# Patient Record
Sex: Female | Born: 1940
Health system: Southern US, Community
[De-identification: ages and names within clinical notes are randomized; demographics above are authoritative.]

## PROBLEM LIST (undated history)

## (undated) ENCOUNTER — Emergency Department (HOSPITAL_COMMUNITY): Discharge status: Absent without leave | Payer: Commercial Managed Care - HMO

## (undated) DIAGNOSIS — F519 Sleep disorder not due to a substance or known physiological condition, unspecified: Secondary | ICD-10-CM

## (undated) DIAGNOSIS — I10 Essential (primary) hypertension: Secondary | ICD-10-CM

## (undated) DIAGNOSIS — F329 Major depressive disorder, single episode, unspecified: Secondary | ICD-10-CM

## (undated) DIAGNOSIS — I2699 Other pulmonary embolism without acute cor pulmonale: Secondary | ICD-10-CM

## (undated) DIAGNOSIS — J189 Pneumonia, unspecified organism: Secondary | ICD-10-CM

## (undated) DIAGNOSIS — R51 Headache: Secondary | ICD-10-CM

## (undated) DIAGNOSIS — E785 Hyperlipidemia, unspecified: Secondary | ICD-10-CM

## (undated) DIAGNOSIS — M5416 Radiculopathy, lumbar region: Secondary | ICD-10-CM

## (undated) DIAGNOSIS — M797 Fibromyalgia: Secondary | ICD-10-CM

## (undated) DIAGNOSIS — R413 Other amnesia: Secondary | ICD-10-CM

## (undated) DIAGNOSIS — R569 Unspecified convulsions: Secondary | ICD-10-CM

## (undated) DIAGNOSIS — M1710 Unilateral primary osteoarthritis, unspecified knee: Secondary | ICD-10-CM

## (undated) DIAGNOSIS — F419 Anxiety disorder, unspecified: Secondary | ICD-10-CM

## (undated) DIAGNOSIS — M949 Disorder of cartilage, unspecified: Secondary | ICD-10-CM

## (undated) DIAGNOSIS — K219 Gastro-esophageal reflux disease without esophagitis: Secondary | ICD-10-CM

## (undated) DIAGNOSIS — M353 Polymyalgia rheumatica: Secondary | ICD-10-CM

## (undated) DIAGNOSIS — R600 Localized edema: Secondary | ICD-10-CM

## (undated) DIAGNOSIS — E0500 Thyrotoxicosis with diffuse goiter without thyrotoxic crisis or storm: Secondary | ICD-10-CM

## (undated) DIAGNOSIS — J309 Allergic rhinitis, unspecified: Secondary | ICD-10-CM

## (undated) DIAGNOSIS — IMO0002 Reserved for concepts with insufficient information to code with codable children: Secondary | ICD-10-CM

## (undated) DIAGNOSIS — R251 Tremor, unspecified: Secondary | ICD-10-CM

## (undated) DIAGNOSIS — J45909 Unspecified asthma, uncomplicated: Secondary | ICD-10-CM

## (undated) DIAGNOSIS — I639 Cerebral infarction, unspecified: Secondary | ICD-10-CM

## (undated) DIAGNOSIS — G43909 Migraine, unspecified, not intractable, without status migrainosus: Secondary | ICD-10-CM

## (undated) DIAGNOSIS — R609 Edema, unspecified: Secondary | ICD-10-CM

## (undated) DIAGNOSIS — F3289 Other specified depressive episodes: Secondary | ICD-10-CM

## (undated) DIAGNOSIS — M899 Disorder of bone, unspecified: Secondary | ICD-10-CM

## (undated) HISTORY — DX: Fibromyalgia: M79.7

## (undated) HISTORY — DX: Radiculopathy, lumbar region: M54.16

## (undated) HISTORY — DX: Reserved for concepts with insufficient information to code with codable children: IMO0002

## (undated) HISTORY — DX: Other pulmonary embolism without acute cor pulmonale: I26.99

## (undated) HISTORY — DX: Polymyalgia rheumatica: M35.3

## (undated) HISTORY — DX: Allergic rhinitis, unspecified: J30.9

## (undated) HISTORY — DX: Headache: R51

## (undated) HISTORY — DX: Thyrotoxicosis with diffuse goiter without thyrotoxic crisis or storm: E05.00

## (undated) HISTORY — DX: Anxiety disorder, unspecified: F41.9

## (undated) HISTORY — DX: Hyperlipidemia, unspecified: E78.5

## (undated) HISTORY — DX: Cerebral infarction, unspecified: I63.9

## (undated) HISTORY — DX: Other specified depressive episodes: F32.89

## (undated) HISTORY — DX: Unspecified convulsions: R56.9

## (undated) HISTORY — DX: Unilateral primary osteoarthritis, unspecified knee: M17.10

## (undated) HISTORY — DX: Essential (primary) hypertension: I10

## (undated) HISTORY — PX: COLONOSCOPY: SHX174

## (undated) HISTORY — DX: Gastro-esophageal reflux disease without esophagitis: K21.9

## (undated) HISTORY — DX: Sleep disorder not due to a substance or known physiological condition, unspecified: F51.9

## (undated) HISTORY — DX: Disorder of bone, unspecified: M89.9

## (undated) HISTORY — DX: Tremor, unspecified: R25.1

## (undated) HISTORY — DX: Major depressive disorder, single episode, unspecified: F32.9

## (undated) HISTORY — DX: Migraine, unspecified, not intractable, without status migrainosus: G43.909

## (undated) HISTORY — DX: Disorder of cartilage, unspecified: M94.9

---

## 1997-05-21 ENCOUNTER — Other Ambulatory Visit: Admission: RE | Admit: 1997-05-21 | Discharge: 1997-05-21 | Payer: Self-pay | Admitting: Obstetrics and Gynecology

## 1997-10-01 ENCOUNTER — Other Ambulatory Visit: Admission: RE | Admit: 1997-10-01 | Discharge: 1997-10-01 | Payer: Self-pay | Admitting: Obstetrics and Gynecology

## 1998-05-24 ENCOUNTER — Encounter: Payer: Self-pay | Admitting: Emergency Medicine

## 1998-05-24 ENCOUNTER — Emergency Department (HOSPITAL_COMMUNITY): Admission: EM | Admit: 1998-05-24 | Discharge: 1998-05-24 | Payer: Self-pay | Admitting: Emergency Medicine

## 1998-11-15 ENCOUNTER — Ambulatory Visit (HOSPITAL_COMMUNITY): Admission: RE | Admit: 1998-11-15 | Discharge: 1998-11-15 | Payer: Self-pay | Admitting: Internal Medicine

## 1998-11-15 ENCOUNTER — Encounter: Payer: Self-pay | Admitting: Internal Medicine

## 1999-10-14 ENCOUNTER — Emergency Department (HOSPITAL_COMMUNITY): Admission: EM | Admit: 1999-10-14 | Discharge: 1999-10-14 | Payer: Self-pay | Admitting: Emergency Medicine

## 1999-12-15 ENCOUNTER — Emergency Department (HOSPITAL_COMMUNITY): Admission: EM | Admit: 1999-12-15 | Discharge: 1999-12-15 | Payer: Self-pay | Admitting: Emergency Medicine

## 2000-01-26 ENCOUNTER — Ambulatory Visit (HOSPITAL_COMMUNITY): Admission: RE | Admit: 2000-01-26 | Discharge: 2000-01-26 | Payer: Self-pay | Admitting: Family Medicine

## 2000-01-26 ENCOUNTER — Encounter: Payer: Self-pay | Admitting: Family Medicine

## 2000-04-25 ENCOUNTER — Ambulatory Visit (HOSPITAL_COMMUNITY): Admission: RE | Admit: 2000-04-25 | Discharge: 2000-04-25 | Payer: Self-pay | Admitting: Family Medicine

## 2000-04-25 ENCOUNTER — Encounter: Payer: Self-pay | Admitting: Family Medicine

## 2000-08-12 ENCOUNTER — Emergency Department (HOSPITAL_COMMUNITY): Admission: EM | Admit: 2000-08-12 | Discharge: 2000-08-12 | Payer: Self-pay | Admitting: Emergency Medicine

## 2000-08-30 ENCOUNTER — Encounter: Payer: Self-pay | Admitting: Emergency Medicine

## 2000-08-30 ENCOUNTER — Inpatient Hospital Stay (HOSPITAL_COMMUNITY): Admission: EM | Admit: 2000-08-30 | Discharge: 2000-08-31 | Payer: Self-pay | Admitting: Emergency Medicine

## 2000-12-30 ENCOUNTER — Encounter: Payer: Self-pay | Admitting: Family Medicine

## 2000-12-30 ENCOUNTER — Ambulatory Visit (HOSPITAL_COMMUNITY): Admission: RE | Admit: 2000-12-30 | Discharge: 2000-12-30 | Payer: Self-pay | Admitting: Family Medicine

## 2001-02-24 ENCOUNTER — Emergency Department (HOSPITAL_COMMUNITY): Admission: EM | Admit: 2001-02-24 | Discharge: 2001-02-24 | Payer: Self-pay | Admitting: Emergency Medicine

## 2001-02-24 ENCOUNTER — Encounter: Payer: Self-pay | Admitting: Emergency Medicine

## 2001-07-21 ENCOUNTER — Emergency Department (HOSPITAL_COMMUNITY): Admission: EM | Admit: 2001-07-21 | Discharge: 2001-07-21 | Payer: Self-pay | Admitting: Emergency Medicine

## 2001-07-24 ENCOUNTER — Encounter: Payer: Self-pay | Admitting: *Deleted

## 2001-07-24 ENCOUNTER — Emergency Department (HOSPITAL_COMMUNITY): Admission: EM | Admit: 2001-07-24 | Discharge: 2001-07-24 | Payer: Self-pay | Admitting: *Deleted

## 2001-11-03 ENCOUNTER — Ambulatory Visit (HOSPITAL_COMMUNITY): Admission: RE | Admit: 2001-11-03 | Discharge: 2001-11-03 | Payer: Self-pay | Admitting: Family Medicine

## 2001-12-10 ENCOUNTER — Emergency Department (HOSPITAL_COMMUNITY): Admission: EM | Admit: 2001-12-10 | Discharge: 2001-12-10 | Payer: Self-pay | Admitting: Emergency Medicine

## 2001-12-19 ENCOUNTER — Emergency Department (HOSPITAL_COMMUNITY): Admission: EM | Admit: 2001-12-19 | Discharge: 2001-12-19 | Payer: Self-pay | Admitting: Emergency Medicine

## 2001-12-19 ENCOUNTER — Encounter: Payer: Self-pay | Admitting: Emergency Medicine

## 2002-01-11 ENCOUNTER — Encounter: Payer: Self-pay | Admitting: Orthopedic Surgery

## 2002-01-11 ENCOUNTER — Ambulatory Visit (HOSPITAL_COMMUNITY): Admission: RE | Admit: 2002-01-11 | Discharge: 2002-01-11 | Payer: Self-pay | Admitting: Orthopedic Surgery

## 2002-02-10 ENCOUNTER — Encounter: Admission: RE | Admit: 2002-02-10 | Discharge: 2002-02-10 | Payer: Self-pay | Admitting: Internal Medicine

## 2002-03-30 ENCOUNTER — Emergency Department (HOSPITAL_COMMUNITY): Admission: EM | Admit: 2002-03-30 | Discharge: 2002-03-30 | Payer: Self-pay | Admitting: Emergency Medicine

## 2002-05-20 ENCOUNTER — Emergency Department (HOSPITAL_COMMUNITY): Admission: EM | Admit: 2002-05-20 | Discharge: 2002-05-20 | Payer: Self-pay | Admitting: Emergency Medicine

## 2002-05-20 ENCOUNTER — Encounter: Payer: Self-pay | Admitting: *Deleted

## 2002-07-07 ENCOUNTER — Ambulatory Visit (HOSPITAL_COMMUNITY): Admission: RE | Admit: 2002-07-07 | Discharge: 2002-07-07 | Payer: Self-pay | Admitting: Family Medicine

## 2002-07-07 ENCOUNTER — Encounter: Payer: Self-pay | Admitting: Family Medicine

## 2002-09-24 ENCOUNTER — Encounter: Payer: Self-pay | Admitting: Emergency Medicine

## 2002-09-24 ENCOUNTER — Emergency Department (HOSPITAL_COMMUNITY): Admission: EM | Admit: 2002-09-24 | Discharge: 2002-09-24 | Payer: Self-pay | Admitting: Emergency Medicine

## 2003-01-18 ENCOUNTER — Encounter: Payer: Self-pay | Admitting: Emergency Medicine

## 2003-01-18 ENCOUNTER — Emergency Department (HOSPITAL_COMMUNITY): Admission: EM | Admit: 2003-01-18 | Discharge: 2003-01-19 | Payer: Self-pay | Admitting: Emergency Medicine

## 2003-04-28 ENCOUNTER — Encounter: Admission: RE | Admit: 2003-04-28 | Discharge: 2003-04-28 | Payer: Self-pay | Admitting: Family Medicine

## 2003-05-05 ENCOUNTER — Encounter: Admission: RE | Admit: 2003-05-05 | Discharge: 2003-05-05 | Payer: Self-pay | Admitting: Family Medicine

## 2003-05-06 ENCOUNTER — Encounter: Admission: RE | Admit: 2003-05-06 | Discharge: 2003-05-06 | Payer: Self-pay | Admitting: Sports Medicine

## 2003-05-18 ENCOUNTER — Encounter: Admission: RE | Admit: 2003-05-18 | Discharge: 2003-05-18 | Payer: Self-pay | Admitting: Sports Medicine

## 2003-06-17 ENCOUNTER — Encounter: Admission: RE | Admit: 2003-06-17 | Discharge: 2003-06-17 | Payer: Self-pay | Admitting: Family Medicine

## 2003-06-23 ENCOUNTER — Encounter: Admission: RE | Admit: 2003-06-23 | Discharge: 2003-06-23 | Payer: Self-pay | Admitting: Family Medicine

## 2003-07-07 ENCOUNTER — Encounter: Admission: RE | Admit: 2003-07-07 | Discharge: 2003-07-07 | Payer: Self-pay | Admitting: Family Medicine

## 2003-08-06 ENCOUNTER — Encounter: Admission: RE | Admit: 2003-08-06 | Discharge: 2003-08-06 | Payer: Self-pay | Admitting: Family Medicine

## 2003-08-06 ENCOUNTER — Ambulatory Visit (HOSPITAL_COMMUNITY): Admission: RE | Admit: 2003-08-06 | Discharge: 2003-08-06 | Payer: Self-pay | Admitting: Family Medicine

## 2003-08-06 ENCOUNTER — Encounter: Admission: RE | Admit: 2003-08-06 | Discharge: 2003-08-06 | Payer: Self-pay | Admitting: Sports Medicine

## 2003-08-20 ENCOUNTER — Encounter: Admission: RE | Admit: 2003-08-20 | Discharge: 2003-08-20 | Payer: Self-pay | Admitting: Sports Medicine

## 2003-09-28 ENCOUNTER — Encounter: Admission: RE | Admit: 2003-09-28 | Discharge: 2003-09-28 | Payer: Self-pay | Admitting: Family Medicine

## 2003-10-22 ENCOUNTER — Encounter: Admission: RE | Admit: 2003-10-22 | Discharge: 2003-10-22 | Payer: Self-pay | Admitting: Family Medicine

## 2003-10-25 ENCOUNTER — Encounter: Admission: RE | Admit: 2003-10-25 | Discharge: 2003-10-25 | Payer: Self-pay | Admitting: Family Medicine

## 2003-10-26 ENCOUNTER — Encounter: Admission: RE | Admit: 2003-10-26 | Discharge: 2003-10-26 | Payer: Self-pay | Admitting: Sports Medicine

## 2003-10-29 ENCOUNTER — Encounter: Admission: RE | Admit: 2003-10-29 | Discharge: 2003-10-29 | Payer: Self-pay | Admitting: Sports Medicine

## 2003-11-09 ENCOUNTER — Encounter: Admission: RE | Admit: 2003-11-09 | Discharge: 2003-11-09 | Payer: Self-pay | Admitting: Family Medicine

## 2003-11-11 ENCOUNTER — Ambulatory Visit (HOSPITAL_COMMUNITY): Admission: RE | Admit: 2003-11-11 | Discharge: 2003-11-11 | Payer: Self-pay | Admitting: Family Medicine

## 2003-12-09 ENCOUNTER — Ambulatory Visit: Payer: Self-pay | Admitting: Family Medicine

## 2004-01-17 ENCOUNTER — Ambulatory Visit: Payer: Self-pay | Admitting: Family Medicine

## 2004-02-01 ENCOUNTER — Ambulatory Visit (HOSPITAL_COMMUNITY): Admission: RE | Admit: 2004-02-01 | Discharge: 2004-02-01 | Payer: Self-pay | Admitting: Sports Medicine

## 2004-02-01 ENCOUNTER — Ambulatory Visit: Payer: Self-pay | Admitting: Sports Medicine

## 2004-02-08 ENCOUNTER — Ambulatory Visit: Payer: Self-pay | Admitting: Sports Medicine

## 2004-02-22 ENCOUNTER — Ambulatory Visit: Payer: Self-pay | Admitting: Family Medicine

## 2004-03-08 ENCOUNTER — Encounter: Admission: RE | Admit: 2004-03-08 | Discharge: 2004-05-16 | Payer: Self-pay | Admitting: Sports Medicine

## 2004-04-02 HISTORY — PX: OTHER SURGICAL HISTORY: SHX169

## 2004-04-04 ENCOUNTER — Ambulatory Visit: Payer: Self-pay | Admitting: Sports Medicine

## 2004-04-29 ENCOUNTER — Emergency Department (HOSPITAL_COMMUNITY): Admission: EM | Admit: 2004-04-29 | Discharge: 2004-04-29 | Payer: Self-pay

## 2004-05-10 ENCOUNTER — Emergency Department (HOSPITAL_COMMUNITY): Admission: EM | Admit: 2004-05-10 | Discharge: 2004-05-11 | Payer: Self-pay | Admitting: *Deleted

## 2004-05-25 ENCOUNTER — Ambulatory Visit: Payer: Self-pay | Admitting: Family Medicine

## 2004-06-12 ENCOUNTER — Encounter: Admission: RE | Admit: 2004-06-12 | Discharge: 2004-06-12 | Payer: Self-pay | Admitting: Sports Medicine

## 2004-06-25 ENCOUNTER — Emergency Department (HOSPITAL_COMMUNITY): Admission: EM | Admit: 2004-06-25 | Discharge: 2004-06-25 | Payer: Self-pay | Admitting: Family Medicine

## 2004-06-28 ENCOUNTER — Ambulatory Visit: Payer: Self-pay | Admitting: Family Medicine

## 2004-07-19 ENCOUNTER — Ambulatory Visit: Payer: Self-pay | Admitting: Family Medicine

## 2004-09-07 ENCOUNTER — Ambulatory Visit: Payer: Self-pay | Admitting: Sports Medicine

## 2004-10-05 ENCOUNTER — Emergency Department (HOSPITAL_COMMUNITY): Admission: EM | Admit: 2004-10-05 | Discharge: 2004-10-06 | Payer: Self-pay | Admitting: Emergency Medicine

## 2004-10-14 ENCOUNTER — Emergency Department (HOSPITAL_COMMUNITY): Admission: EM | Admit: 2004-10-14 | Discharge: 2004-10-14 | Payer: Self-pay | Admitting: Family Medicine

## 2004-10-20 ENCOUNTER — Ambulatory Visit: Payer: Self-pay | Admitting: Family Medicine

## 2004-11-25 ENCOUNTER — Emergency Department (HOSPITAL_COMMUNITY): Admission: EM | Admit: 2004-11-25 | Discharge: 2004-11-25 | Payer: Self-pay | Admitting: Family Medicine

## 2004-12-08 ENCOUNTER — Ambulatory Visit: Payer: Self-pay | Admitting: Family Medicine

## 2004-12-27 ENCOUNTER — Ambulatory Visit: Payer: Self-pay | Admitting: Family Medicine

## 2005-01-10 ENCOUNTER — Encounter: Admission: RE | Admit: 2005-01-10 | Discharge: 2005-03-01 | Payer: Self-pay | Admitting: Family Medicine

## 2005-01-12 ENCOUNTER — Ambulatory Visit: Payer: Self-pay

## 2005-02-28 ENCOUNTER — Ambulatory Visit: Payer: Self-pay | Admitting: Family Medicine

## 2005-04-12 ENCOUNTER — Emergency Department (HOSPITAL_COMMUNITY): Admission: EM | Admit: 2005-04-12 | Discharge: 2005-04-12 | Payer: Self-pay | Admitting: Family Medicine

## 2005-05-01 ENCOUNTER — Ambulatory Visit: Payer: Self-pay | Admitting: Family Medicine

## 2005-05-09 ENCOUNTER — Encounter: Admission: RE | Admit: 2005-05-09 | Discharge: 2005-06-06 | Payer: Self-pay | Admitting: Family Medicine

## 2005-05-30 ENCOUNTER — Ambulatory Visit: Payer: Self-pay | Admitting: Family Medicine

## 2005-06-12 ENCOUNTER — Emergency Department (HOSPITAL_COMMUNITY): Admission: EM | Admit: 2005-06-12 | Discharge: 2005-06-12 | Payer: Self-pay | Admitting: *Deleted

## 2005-06-14 ENCOUNTER — Ambulatory Visit: Payer: Self-pay | Admitting: Family Medicine

## 2005-07-01 LAB — HM COLONOSCOPY

## 2005-07-31 ENCOUNTER — Ambulatory Visit: Payer: Self-pay | Admitting: Family Medicine

## 2005-08-03 ENCOUNTER — Encounter: Admission: RE | Admit: 2005-08-03 | Discharge: 2005-08-03 | Payer: Self-pay | Admitting: Sports Medicine

## 2005-08-20 ENCOUNTER — Emergency Department (HOSPITAL_COMMUNITY): Admission: EM | Admit: 2005-08-20 | Discharge: 2005-08-20 | Payer: Self-pay | Admitting: Emergency Medicine

## 2005-09-18 ENCOUNTER — Ambulatory Visit (HOSPITAL_COMMUNITY): Admission: RE | Admit: 2005-09-18 | Discharge: 2005-09-18 | Payer: Self-pay | Admitting: Gastroenterology

## 2005-10-05 ENCOUNTER — Ambulatory Visit: Payer: Self-pay | Admitting: Family Medicine

## 2005-10-05 ENCOUNTER — Ambulatory Visit (HOSPITAL_COMMUNITY): Admission: RE | Admit: 2005-10-05 | Discharge: 2005-10-05 | Payer: Self-pay | Admitting: Family Medicine

## 2005-10-22 HISTORY — PX: NM MYOCAR PERF WALL MOTION: HXRAD629

## 2005-11-09 ENCOUNTER — Ambulatory Visit: Payer: Self-pay | Admitting: Family Medicine

## 2005-11-29 ENCOUNTER — Ambulatory Visit: Payer: Self-pay | Admitting: Family Medicine

## 2005-11-30 ENCOUNTER — Encounter: Admission: RE | Admit: 2005-11-30 | Discharge: 2005-11-30 | Payer: Self-pay | Admitting: Orthopedic Surgery

## 2005-12-25 ENCOUNTER — Ambulatory Visit: Payer: Self-pay | Admitting: Family Medicine

## 2005-12-26 ENCOUNTER — Ambulatory Visit (HOSPITAL_COMMUNITY): Admission: RE | Admit: 2005-12-26 | Discharge: 2005-12-26 | Payer: Self-pay | Admitting: Orthopedic Surgery

## 2006-01-11 ENCOUNTER — Ambulatory Visit: Payer: Self-pay | Admitting: Family Medicine

## 2006-01-16 ENCOUNTER — Encounter: Admission: RE | Admit: 2006-01-16 | Discharge: 2006-02-06 | Payer: Self-pay | Admitting: Orthopedic Surgery

## 2006-04-02 ENCOUNTER — Encounter (INDEPENDENT_AMBULATORY_CARE_PROVIDER_SITE_OTHER): Payer: Self-pay | Admitting: *Deleted

## 2006-04-02 LAB — CONVERTED CEMR LAB

## 2006-04-12 ENCOUNTER — Ambulatory Visit: Payer: Self-pay | Admitting: Family Medicine

## 2006-04-12 ENCOUNTER — Encounter (INDEPENDENT_AMBULATORY_CARE_PROVIDER_SITE_OTHER): Payer: Self-pay | Admitting: Family Medicine

## 2006-04-12 ENCOUNTER — Other Ambulatory Visit: Admission: RE | Admit: 2006-04-12 | Discharge: 2006-04-12 | Payer: Self-pay | Admitting: Family Medicine

## 2006-04-23 ENCOUNTER — Ambulatory Visit: Payer: Self-pay | Admitting: Family Medicine

## 2006-05-21 ENCOUNTER — Ambulatory Visit: Payer: Self-pay | Admitting: Family Medicine

## 2006-05-21 ENCOUNTER — Ambulatory Visit (HOSPITAL_COMMUNITY): Admission: RE | Admit: 2006-05-21 | Discharge: 2006-05-21 | Payer: Self-pay | Admitting: Family Medicine

## 2006-05-30 DIAGNOSIS — F339 Major depressive disorder, recurrent, unspecified: Secondary | ICD-10-CM | POA: Insufficient documentation

## 2006-05-30 DIAGNOSIS — E785 Hyperlipidemia, unspecified: Secondary | ICD-10-CM

## 2006-05-30 DIAGNOSIS — M1710 Unilateral primary osteoarthritis, unspecified knee: Secondary | ICD-10-CM

## 2006-05-30 DIAGNOSIS — K219 Gastro-esophageal reflux disease without esophagitis: Secondary | ICD-10-CM

## 2006-05-30 DIAGNOSIS — J309 Allergic rhinitis, unspecified: Secondary | ICD-10-CM | POA: Insufficient documentation

## 2006-05-31 ENCOUNTER — Encounter (INDEPENDENT_AMBULATORY_CARE_PROVIDER_SITE_OTHER): Payer: Self-pay | Admitting: *Deleted

## 2006-06-06 ENCOUNTER — Telehealth: Payer: Self-pay | Admitting: *Deleted

## 2006-06-21 ENCOUNTER — Ambulatory Visit: Payer: Self-pay | Admitting: Family Medicine

## 2006-06-21 DIAGNOSIS — R5383 Other fatigue: Secondary | ICD-10-CM

## 2006-06-21 DIAGNOSIS — R5381 Other malaise: Secondary | ICD-10-CM | POA: Insufficient documentation

## 2006-07-12 ENCOUNTER — Telehealth: Payer: Self-pay | Admitting: *Deleted

## 2006-07-18 ENCOUNTER — Ambulatory Visit: Payer: Self-pay | Admitting: Family Medicine

## 2006-08-07 ENCOUNTER — Telehealth (INDEPENDENT_AMBULATORY_CARE_PROVIDER_SITE_OTHER): Payer: Self-pay | Admitting: Family Medicine

## 2006-08-22 ENCOUNTER — Encounter (INDEPENDENT_AMBULATORY_CARE_PROVIDER_SITE_OTHER): Payer: Self-pay | Admitting: Family Medicine

## 2006-08-22 ENCOUNTER — Encounter: Admission: RE | Admit: 2006-08-22 | Discharge: 2006-08-22 | Payer: Self-pay | Admitting: Sports Medicine

## 2006-09-09 ENCOUNTER — Ambulatory Visit: Payer: Self-pay | Admitting: Sports Medicine

## 2006-09-13 ENCOUNTER — Emergency Department (HOSPITAL_COMMUNITY): Admission: EM | Admit: 2006-09-13 | Discharge: 2006-09-13 | Payer: Self-pay | Admitting: Family Medicine

## 2006-10-01 ENCOUNTER — Telehealth: Payer: Self-pay | Admitting: *Deleted

## 2006-10-02 ENCOUNTER — Ambulatory Visit: Payer: Self-pay | Admitting: Family Medicine

## 2006-10-02 ENCOUNTER — Encounter (INDEPENDENT_AMBULATORY_CARE_PROVIDER_SITE_OTHER): Payer: Self-pay | Admitting: Family Medicine

## 2006-10-02 LAB — CONVERTED CEMR LAB
Albumin: 3.9 g/dL (ref 3.5–5.2)
BUN: 21 mg/dL (ref 6–23)
CO2: 25 meq/L (ref 19–32)
Calcium: 9.6 mg/dL (ref 8.4–10.5)
Glucose, Bld: 89 mg/dL (ref 70–99)
Hemoglobin: 14.4 g/dL (ref 12.0–15.0)
MCHC: 33 g/dL (ref 30.0–36.0)
MCV: 89.2 fL (ref 78.0–100.0)
Potassium: 3.9 meq/L (ref 3.5–5.3)
Pro B Natriuretic peptide (BNP): 41 pg/mL (ref 0.0–100.0)
RBC: 4.89 M/uL (ref 3.87–5.11)
Sodium: 136 meq/L (ref 135–145)
TSH: 1.25 microintl units/mL (ref 0.350–5.50)
Total Protein: 7.2 g/dL (ref 6.0–8.3)
WBC: 4.7 10*3/uL (ref 4.0–10.5)

## 2006-10-03 ENCOUNTER — Encounter (INDEPENDENT_AMBULATORY_CARE_PROVIDER_SITE_OTHER): Payer: Self-pay | Admitting: Family Medicine

## 2006-11-13 ENCOUNTER — Encounter (INDEPENDENT_AMBULATORY_CARE_PROVIDER_SITE_OTHER): Payer: Self-pay | Admitting: *Deleted

## 2006-11-22 ENCOUNTER — Ambulatory Visit: Payer: Self-pay | Admitting: Family Medicine

## 2006-11-22 ENCOUNTER — Encounter (INDEPENDENT_AMBULATORY_CARE_PROVIDER_SITE_OTHER): Payer: Self-pay | Admitting: *Deleted

## 2006-11-25 LAB — CONVERTED CEMR LAB: Phenobarbital: 7.5 ug/mL — ABNORMAL LOW (ref 15.0–40.0)

## 2006-12-19 ENCOUNTER — Telehealth: Payer: Self-pay | Admitting: *Deleted

## 2007-01-21 ENCOUNTER — Ambulatory Visit: Payer: Self-pay | Admitting: Family Medicine

## 2007-01-21 ENCOUNTER — Encounter (INDEPENDENT_AMBULATORY_CARE_PROVIDER_SITE_OTHER): Payer: Self-pay | Admitting: *Deleted

## 2007-01-21 DIAGNOSIS — G479 Sleep disorder, unspecified: Secondary | ICD-10-CM

## 2007-01-21 LAB — CONVERTED CEMR LAB
ALT: 16 units/L (ref 0–35)
Albumin: 3.9 g/dL (ref 3.5–5.2)
Alkaline Phosphatase: 108 units/L (ref 39–117)
Glucose, Bld: 85 mg/dL (ref 70–99)
MCHC: 31.3 g/dL (ref 30.0–36.0)
Platelets: 240 10*3/uL (ref 150–400)
Potassium: 4.5 meq/L (ref 3.5–5.3)
RBC: 4.96 M/uL (ref 3.87–5.11)
Sodium: 143 meq/L (ref 135–145)
TSH: 2.842 microintl units/mL (ref 0.350–5.50)
Total Protein: 7.3 g/dL (ref 6.0–8.3)
WBC: 5.2 10*3/uL (ref 4.0–10.5)

## 2007-02-06 ENCOUNTER — Telehealth: Payer: Self-pay | Admitting: *Deleted

## 2007-02-16 ENCOUNTER — Emergency Department (HOSPITAL_COMMUNITY): Admission: EM | Admit: 2007-02-16 | Discharge: 2007-02-16 | Payer: Self-pay | Admitting: Emergency Medicine

## 2007-04-23 ENCOUNTER — Ambulatory Visit: Payer: Self-pay | Admitting: Family Medicine

## 2007-05-02 ENCOUNTER — Encounter: Payer: Self-pay | Admitting: Family Medicine

## 2007-05-03 ENCOUNTER — Telehealth (INDEPENDENT_AMBULATORY_CARE_PROVIDER_SITE_OTHER): Payer: Self-pay | Admitting: *Deleted

## 2007-05-07 ENCOUNTER — Ambulatory Visit: Payer: Self-pay | Admitting: Family Medicine

## 2007-05-16 ENCOUNTER — Ambulatory Visit: Payer: Self-pay | Admitting: Sports Medicine

## 2007-05-16 ENCOUNTER — Encounter: Admission: RE | Admit: 2007-05-16 | Discharge: 2007-05-16 | Payer: Self-pay | Admitting: Family Medicine

## 2007-05-16 DIAGNOSIS — M25569 Pain in unspecified knee: Secondary | ICD-10-CM

## 2007-05-19 ENCOUNTER — Telehealth: Payer: Self-pay | Admitting: *Deleted

## 2007-05-20 ENCOUNTER — Telehealth: Payer: Self-pay | Admitting: *Deleted

## 2007-05-21 ENCOUNTER — Ambulatory Visit: Payer: Self-pay | Admitting: Family Medicine

## 2007-06-18 ENCOUNTER — Ambulatory Visit: Payer: Self-pay | Admitting: Family Medicine

## 2007-06-18 ENCOUNTER — Encounter (INDEPENDENT_AMBULATORY_CARE_PROVIDER_SITE_OTHER): Payer: Self-pay | Admitting: Family Medicine

## 2007-07-01 ENCOUNTER — Encounter: Payer: Self-pay | Admitting: Family Medicine

## 2007-07-22 ENCOUNTER — Encounter (INDEPENDENT_AMBULATORY_CARE_PROVIDER_SITE_OTHER): Payer: Self-pay | Admitting: Family Medicine

## 2007-07-22 ENCOUNTER — Ambulatory Visit: Payer: Self-pay | Admitting: Family Medicine

## 2007-07-22 DIAGNOSIS — R609 Edema, unspecified: Secondary | ICD-10-CM

## 2007-07-22 LAB — CONVERTED CEMR LAB
BUN: 17 mg/dL (ref 6–23)
CO2: 25 meq/L (ref 19–32)
Calcium: 9.2 mg/dL (ref 8.4–10.5)
Creatinine, Ser: 0.87 mg/dL (ref 0.40–1.20)
Glucose, Bld: 98 mg/dL (ref 70–99)
HCT: 44.8 % (ref 36.0–46.0)
Hemoglobin: 14.1 g/dL (ref 12.0–15.0)
MCHC: 31.5 g/dL (ref 30.0–36.0)
MCV: 92.8 fL (ref 78.0–100.0)
RBC: 4.83 M/uL (ref 3.87–5.11)
Sodium: 143 meq/L (ref 135–145)
TSH: 0.941 microintl units/mL (ref 0.350–5.50)

## 2007-07-23 ENCOUNTER — Encounter (INDEPENDENT_AMBULATORY_CARE_PROVIDER_SITE_OTHER): Payer: Self-pay | Admitting: Family Medicine

## 2007-08-15 ENCOUNTER — Ambulatory Visit: Payer: Self-pay | Admitting: Family Medicine

## 2007-08-15 DIAGNOSIS — K5900 Constipation, unspecified: Secondary | ICD-10-CM | POA: Insufficient documentation

## 2007-08-28 ENCOUNTER — Ambulatory Visit: Payer: Self-pay | Admitting: Family Medicine

## 2007-08-28 ENCOUNTER — Encounter (INDEPENDENT_AMBULATORY_CARE_PROVIDER_SITE_OTHER): Payer: Self-pay | Admitting: Family Medicine

## 2007-10-01 ENCOUNTER — Encounter: Admission: RE | Admit: 2007-10-01 | Discharge: 2007-10-01 | Payer: Self-pay | Admitting: Family Medicine

## 2007-10-01 ENCOUNTER — Telehealth: Payer: Self-pay | Admitting: *Deleted

## 2007-10-01 ENCOUNTER — Ambulatory Visit: Payer: Self-pay | Admitting: Family Medicine

## 2007-10-06 ENCOUNTER — Encounter: Admission: RE | Admit: 2007-10-06 | Discharge: 2007-10-06 | Payer: Self-pay | Admitting: Family Medicine

## 2007-10-13 LAB — HM MAMMOGRAPHY: HM Mammogram: NORMAL

## 2007-10-15 ENCOUNTER — Telehealth: Payer: Self-pay | Admitting: *Deleted

## 2007-10-15 ENCOUNTER — Ambulatory Visit: Payer: Self-pay | Admitting: Family Medicine

## 2007-11-21 ENCOUNTER — Telehealth: Payer: Self-pay | Admitting: Family Medicine

## 2007-12-05 ENCOUNTER — Telehealth: Payer: Self-pay | Admitting: *Deleted

## 2007-12-29 ENCOUNTER — Telehealth: Payer: Self-pay | Admitting: *Deleted

## 2007-12-31 ENCOUNTER — Ambulatory Visit: Payer: Self-pay | Admitting: Family Medicine

## 2008-02-18 ENCOUNTER — Ambulatory Visit: Payer: Self-pay | Admitting: Family Medicine

## 2008-03-10 ENCOUNTER — Encounter: Payer: Self-pay | Admitting: Internal Medicine

## 2008-03-10 LAB — CONVERTED CEMR LAB
ALT: 19 units/L
AST: 16 units/L
BUN: 18 mg/dL
Calcium: 9.1 mg/dL
Glucose, Bld: 128 mg/dL
MCV: 90.1 fL
Platelets: 256 10*3/uL
RBC: 4.73 M/uL
Total Bilirubin: 0.3 mg/dL
Total Protein: 6.8 g/dL

## 2008-06-19 ENCOUNTER — Emergency Department (HOSPITAL_COMMUNITY): Admission: EM | Admit: 2008-06-19 | Discharge: 2008-06-19 | Payer: Self-pay | Admitting: Emergency Medicine

## 2008-08-01 ENCOUNTER — Emergency Department (HOSPITAL_COMMUNITY): Admission: EM | Admit: 2008-08-01 | Discharge: 2008-08-01 | Payer: Self-pay | Admitting: Emergency Medicine

## 2008-09-20 ENCOUNTER — Encounter: Payer: Self-pay | Admitting: Internal Medicine

## 2008-09-20 LAB — CONVERTED CEMR LAB
ALT: 16 units/L
AST: 22 units/L
BUN: 15 mg/dL
Chloride: 104 meq/L
Glucose, Bld: 97 mg/dL
HCT: 43.2 %
Platelets: 217 10*3/uL
Potassium: 4 meq/L
RBC: 4.74 M/uL
Total Bilirubin: 0.3 mg/dL
Total Protein: 7 g/dL
WBC: 5.8 10*3/uL

## 2008-11-05 ENCOUNTER — Encounter: Admission: RE | Admit: 2008-11-05 | Discharge: 2008-11-05 | Payer: Self-pay | Admitting: Internal Medicine

## 2009-04-29 ENCOUNTER — Encounter: Payer: Self-pay | Admitting: Internal Medicine

## 2009-06-07 ENCOUNTER — Encounter: Admission: RE | Admit: 2009-06-07 | Discharge: 2009-06-07 | Payer: Self-pay | Admitting: Internal Medicine

## 2009-06-22 ENCOUNTER — Encounter: Payer: Self-pay | Admitting: Internal Medicine

## 2009-07-19 ENCOUNTER — Encounter: Payer: Self-pay | Admitting: Internal Medicine

## 2009-08-11 HISTORY — PX: NM MYOCAR PERF WALL MOTION: HXRAD629

## 2009-08-15 ENCOUNTER — Ambulatory Visit: Payer: Self-pay | Admitting: Internal Medicine

## 2009-09-01 ENCOUNTER — Encounter: Payer: Self-pay | Admitting: Internal Medicine

## 2009-09-01 LAB — CONVERTED CEMR LAB
ALT: 16 units/L
AST: 19 units/L
Alkaline Phosphatase: 116 units/L
Basophils Relative: 1.662 %
CO2: 23 meq/L
Calcium: 9.5 mg/dL
Glucose, Bld: 85 mg/dL
HCT: 44.7 %
Hemoglobin: 14 g/dL
Platelets: 218 10*3/uL
Total Bilirubin: 0.4 mg/dL

## 2009-10-17 ENCOUNTER — Encounter (HOSPITAL_COMMUNITY): Admission: RE | Admit: 2009-10-17 | Discharge: 2009-12-29 | Payer: Self-pay | Admitting: Endocrinology

## 2009-10-25 ENCOUNTER — Ambulatory Visit: Payer: Self-pay | Admitting: Internal Medicine

## 2009-10-25 DIAGNOSIS — F329 Major depressive disorder, single episode, unspecified: Secondary | ICD-10-CM

## 2009-10-25 DIAGNOSIS — E0500 Thyrotoxicosis with diffuse goiter without thyrotoxic crisis or storm: Secondary | ICD-10-CM | POA: Insufficient documentation

## 2009-10-25 DIAGNOSIS — R51 Headache: Secondary | ICD-10-CM

## 2009-11-07 ENCOUNTER — Encounter (INDEPENDENT_AMBULATORY_CARE_PROVIDER_SITE_OTHER): Payer: Self-pay | Admitting: *Deleted

## 2009-12-08 ENCOUNTER — Encounter: Payer: Self-pay | Admitting: Internal Medicine

## 2009-12-14 ENCOUNTER — Telehealth (INDEPENDENT_AMBULATORY_CARE_PROVIDER_SITE_OTHER): Payer: Self-pay | Admitting: *Deleted

## 2009-12-19 ENCOUNTER — Encounter: Payer: Self-pay | Admitting: Internal Medicine

## 2009-12-19 DIAGNOSIS — M8580 Other specified disorders of bone density and structure, unspecified site: Secondary | ICD-10-CM | POA: Insufficient documentation

## 2010-02-14 ENCOUNTER — Ambulatory Visit: Payer: Self-pay | Admitting: Internal Medicine

## 2010-02-22 ENCOUNTER — Telehealth: Payer: Self-pay | Admitting: Internal Medicine

## 2010-03-02 ENCOUNTER — Encounter (INDEPENDENT_AMBULATORY_CARE_PROVIDER_SITE_OTHER): Payer: Self-pay | Admitting: Emergency Medicine

## 2010-03-02 ENCOUNTER — Emergency Department (HOSPITAL_COMMUNITY)
Admission: EM | Admit: 2010-03-02 | Discharge: 2010-03-02 | Payer: Self-pay | Source: Home / Self Care | Admitting: Emergency Medicine

## 2010-03-02 HISTORY — PX: OTHER SURGICAL HISTORY: SHX169

## 2010-03-06 ENCOUNTER — Ambulatory Visit: Payer: Self-pay | Admitting: Internal Medicine

## 2010-03-06 DIAGNOSIS — M79609 Pain in unspecified limb: Secondary | ICD-10-CM

## 2010-03-14 ENCOUNTER — Ambulatory Visit: Payer: Self-pay | Admitting: Internal Medicine

## 2010-03-17 ENCOUNTER — Encounter: Payer: Self-pay | Admitting: Internal Medicine

## 2010-04-11 ENCOUNTER — Telehealth: Payer: Self-pay | Admitting: Internal Medicine

## 2010-04-14 ENCOUNTER — Encounter: Payer: Self-pay | Admitting: Internal Medicine

## 2010-04-23 ENCOUNTER — Encounter: Payer: Self-pay | Admitting: Family Medicine

## 2010-05-02 NOTE — Letter (Signed)
[facility]   Imported By: Lester [place] 12/22/2009 08:57:59  _____________________________________________________________________  External Attachment:    Type:   Image     Comment:   External Document
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
## 2010-05-02 NOTE — Letter (Signed)
Summary: [facility] Consult Scheduled Letter  [facility]  [address] [place], [place] [address]   Phone: [phone]  Fax: [phone]      11/07/2009 MRN: [id number]  [facility] [address] [place], [place]  [address]    Dear Ms. Voorhis,      We have scheduled an appointment for you.  At the recommendation of Dr.Leschber, we have scheduled you a consult with Dr Terrace Arabia on 12/07/09 at1:30pm.  Their phone number is [phone].  If this appointment day and time is not convenient for you, please feel free to call the office of the doctor you are being referred to at the number listed above and reschedule the appointment.     [facility] [address], [address] [place], [place] [address]    Thank you,  Patient Care Coordinator [facility]

## 2010-05-02 NOTE — Progress Notes (Signed)
  Phone Note Other Incoming   Request: Send information Summary of Call: Records received from [employer], [place]. 32 pages forwarded to Dr. Felicity Coyer for review.

## 2010-05-02 NOTE — Progress Notes (Signed)
Summary: flexeril  Phone Note Refill Request Message from:  Fax from Pharmacy on February 22, 2010 4:47 PM  Refills Requested: Medication #1:  FLEXERIL 5 MG TABS 1 by mouth tid   Dosage confirmed as above?Dosage Confirmed  Method Requested: Electronic Next Appointment Scheduled: 03/14/2010 Initial call taken by: Brenton Grills CMA Duncan Dull),  February 22, 2010 4:48 PM    Prescriptions: FLEXERIL 5 MG TABS (CYCLOBENZAPRINE HCL) 1 by mouth tid  #90 x 1   Entered by:   Brenton Grills CMA (AAMA)   Authorized by:   Newt Lukes MD   Signed by:   Brenton Grills CMA (AAMA) on 02/22/2010   Method used:   Electronically to        [facility] (retail)       [address]       [place], [place]  [id number]       Ph: [phone]       Fax: [phone]   RxID:   [id number]

## 2010-05-02 NOTE — Letter (Signed)
[facility]   Imported By: Lester [place] 12/22/2009 09:01:11  _____________________________________________________________________  External Attachment:    Type:   Image     Comment:   External Document

## 2010-05-02 NOTE — Letter (Signed)
Summary: [facility]   Imported By: Lester  12/22/2009 08:59:55  _____________________________________________________________________  External Attachment:    Type:   Image     Comment:   External Document

## 2010-05-02 NOTE — Letter (Signed)
Summary: [facility]   Imported By: Lester [place] 12/22/2009 08:57:10  _____________________________________________________________________  External Attachment:    Type:   Image     Comment:   External Document
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
## 2010-05-02 NOTE — Consult Note (Signed)
Summary: [facility]  [facility]   Imported By: Sherian Rein 12/12/2009 12:31:47  _____________________________________________________________________  External Attachment:    Type:   Image     Comment:   External Document

## 2010-05-02 NOTE — Letter (Signed)
Summary: [facility]  [facility]   Imported By: Lester [place] 12/22/2009 08:58:53  _____________________________________________________________________  External Attachment:    Type:   Image     Comment:   External Document

## 2010-05-04 NOTE — Progress Notes (Signed)
Summary: leg pain  Phone Note Call from Patient Call back at Home Phone [phone]   Caller: Patient Summary of Call: Pt called requesting Rx for pain meds. Pt states she is having leg pain, please advise. Initial call taken by: Margaret Pyle, CMA,  April 11, 2010 8:18 AM  Follow-up for Phone Call        pt has appt with spine specialists 04/12/10 (per orders from [facility] arranging same) - tomorrow - if still taking naprosyn, stop this and use diclofenac 75mg  two times a day for next 3 days - erx done - other pain mgmt per back specialist (to be determined tomorrow) Follow-up by: Newt Lukes MD,  April 11, 2010 11:43 AM  Additional Follow-up for Phone Call Additional follow up Details #1::        Pt informed Additional Follow-up by: Margaret Pyle, CMA,  April 11, 2010 2:00 PM    New/Updated Medications: DICLOFENAC SODIUM 75 MG TBEC (DICLOFENAC SODIUM) 1 by mouth two times a day x 3 days then as needed for pain symptoms Prescriptions: DICLOFENAC SODIUM 75 MG TBEC (DICLOFENAC SODIUM) 1 by mouth two times a day x 3 days then as needed for pain symptoms  #20 x 0   Entered and Authorized by:   Newt Lukes MD   Signed by:   Newt Lukes MD on 04/11/2010   Method used:   Electronically to        [facility] (retail)       [address]       [place], [place]  [address]       Ph: [phone]       Fax: [phone]   RxID:   [id number]

## 2010-05-04 NOTE — Letter (Signed)
Summary: [facility]   Imported By: Lester [place] 04/19/2010 08:15:34  _____________________________________________________________________  External Attachment:    Type:   Image     Comment:   External Document

## 2010-05-04 NOTE — Letter (Signed)
Summary: [facility]  [facility]   Imported By: Sherian Rein 03/22/2010 14:16:40  _____________________________________________________________________  External Attachment:    Type:   Image     Comment:   External Document

## 2010-06-14 ENCOUNTER — Encounter: Payer: Self-pay | Admitting: Internal Medicine

## 2010-06-20 NOTE — Letter (Signed)
Summary: Out of Work  [facility]  [address]   [place], [place] [address]   Phone:   Fax:     June 14, 2010   RE:  [name]    To Whom It May Concern:  [name] has a history of Epilepsy and Fibromyalgia.       If you need additional information, please feel free to contact our office.         Sincerely,    Vikki Ports A. Felicity Coyer

## 2010-06-26 ENCOUNTER — Ambulatory Visit (HOSPITAL_COMMUNITY)
Admission: RE | Admit: 2010-06-26 | Discharge: 2010-06-26 | Disposition: A | Discharge status: Home / Self Care | Payer: PRIVATE HEALTH INSURANCE | Source: Ambulatory Visit | Attending: Orthopedic Surgery | Admitting: Orthopedic Surgery

## 2010-06-26 ENCOUNTER — Other Ambulatory Visit (HOSPITAL_COMMUNITY): Payer: Self-pay | Admitting: Orthopedic Surgery

## 2010-06-26 ENCOUNTER — Encounter (HOSPITAL_COMMUNITY)
Admission: RE | Admit: 2010-06-26 | Discharge: 2010-06-26 | Disposition: A | Discharge status: Home / Self Care | Payer: PRIVATE HEALTH INSURANCE | Source: Ambulatory Visit | Attending: Orthopedic Surgery | Admitting: Orthopedic Surgery

## 2010-06-26 DIAGNOSIS — Z01811 Encounter for preprocedural respiratory examination: Secondary | ICD-10-CM | POA: Insufficient documentation

## 2010-06-26 DIAGNOSIS — IMO0002 Reserved for concepts with insufficient information to code with codable children: Secondary | ICD-10-CM | POA: Insufficient documentation

## 2010-06-26 DIAGNOSIS — Z01818 Encounter for other preprocedural examination: Secondary | ICD-10-CM | POA: Insufficient documentation

## 2010-06-26 DIAGNOSIS — M1711 Unilateral primary osteoarthritis, right knee: Secondary | ICD-10-CM

## 2010-06-26 DIAGNOSIS — Z01812 Encounter for preprocedural laboratory examination: Secondary | ICD-10-CM | POA: Insufficient documentation

## 2010-06-26 DIAGNOSIS — M1710 Unilateral primary osteoarthritis, unspecified knee: Secondary | ICD-10-CM | POA: Insufficient documentation

## 2010-06-26 LAB — BASIC METABOLIC PANEL
BUN: 23 mg/dL (ref 6–23)
Creatinine, Ser: 1.18 mg/dL (ref 0.4–1.2)
GFR calc non Af Amer: 46 mL/min — ABNORMAL LOW (ref >60)
Glucose, Bld: 87 mg/dL (ref 70–99)

## 2010-06-26 LAB — DIFFERENTIAL
Basophils Absolute: 0 10*3/uL (ref 0.0–0.1)
Eosinophils Relative: 1 % (ref 0–5)
Lymphocytes Relative: 40 % (ref 12–46)
Neutrophils Relative %: 50 % (ref 43–77)

## 2010-06-26 LAB — SURGICAL PCR SCREEN: MRSA, PCR: NEGATIVE

## 2010-06-26 LAB — URINALYSIS, ROUTINE W REFLEX MICROSCOPIC
Glucose, UA: NEGATIVE mg/dL
Hgb urine dipstick: NEGATIVE
Ketones, ur: 15 mg/dL — AB
pH: 5.5 (ref 5.0–8.0)

## 2010-06-26 LAB — URINE MICROSCOPIC-ADD ON

## 2010-06-26 LAB — CBC
MCH: 29.8 pg (ref 26.0–34.0)
MCHC: 32.9 g/dL (ref 30.0–36.0)
MCV: 90.4 fL (ref 78.0–100.0)
Platelets: 203 10*3/uL (ref 150–400)
RDW: 14.2 % (ref 11.5–15.5)
WBC: 6.4 10*3/uL (ref 4.0–10.5)

## 2010-06-26 LAB — TYPE AND SCREEN: ABO/RH(D): B POS

## 2010-07-03 ENCOUNTER — Inpatient Hospital Stay (HOSPITAL_COMMUNITY)
Admission: RE | Admit: 2010-07-03 | Discharge: 2010-07-06 | DRG: 470 | Disposition: A | Discharge status: Skilled Nursing Facility | Payer: PRIVATE HEALTH INSURANCE | Source: Ambulatory Visit | Attending: Orthopedic Surgery | Admitting: Orthopedic Surgery

## 2010-07-03 DIAGNOSIS — K219 Gastro-esophageal reflux disease without esophagitis: Secondary | ICD-10-CM | POA: Diagnosis present

## 2010-07-03 DIAGNOSIS — F3289 Other specified depressive episodes: Secondary | ICD-10-CM | POA: Diagnosis present

## 2010-07-03 DIAGNOSIS — IMO0002 Reserved for concepts with insufficient information to code with codable children: Principal | ICD-10-CM | POA: Diagnosis present

## 2010-07-03 DIAGNOSIS — F329 Major depressive disorder, single episode, unspecified: Secondary | ICD-10-CM | POA: Diagnosis present

## 2010-07-03 DIAGNOSIS — E7800 Pure hypercholesterolemia, unspecified: Secondary | ICD-10-CM | POA: Diagnosis present

## 2010-07-03 DIAGNOSIS — M1710 Unilateral primary osteoarthritis, unspecified knee: Principal | ICD-10-CM | POA: Diagnosis present

## 2010-07-03 DIAGNOSIS — Z9849 Cataract extraction status, unspecified eye: Secondary | ICD-10-CM

## 2010-07-03 DIAGNOSIS — G40909 Epilepsy, unspecified, not intractable, without status epilepticus: Secondary | ICD-10-CM | POA: Diagnosis present

## 2010-07-03 DIAGNOSIS — Z7901 Long term (current) use of anticoagulants: Secondary | ICD-10-CM

## 2010-07-03 DIAGNOSIS — R110 Nausea: Secondary | ICD-10-CM | POA: Diagnosis not present

## 2010-07-04 LAB — BASIC METABOLIC PANEL
CO2: 30 mEq/L (ref 19–32)
Chloride: 100 mEq/L (ref 96–112)
Creatinine, Ser: 0.91 mg/dL (ref 0.4–1.2)
GFR calc Af Amer: >60 mL/min (ref >60)
Potassium: 3.8 mEq/L (ref 3.5–5.1)

## 2010-07-04 LAB — PROTIME-INR: INR: 1.12 (ref 0.00–1.49)

## 2010-07-04 LAB — CBC
Hemoglobin: 11.1 g/dL — ABNORMAL LOW (ref 12.0–15.0)
MCH: 29.5 pg (ref 26.0–34.0)
MCV: 90.2 fL (ref 78.0–100.0)
RBC: 3.76 MIL/uL — ABNORMAL LOW (ref 3.87–5.11)
WBC: 11.2 10*3/uL — ABNORMAL HIGH (ref 4.0–10.5)

## 2010-07-05 LAB — PROTIME-INR
INR: 1.3 (ref 0.00–1.49)
Prothrombin Time: 16.4 seconds — ABNORMAL HIGH (ref 11.6–15.2)

## 2010-07-05 LAB — CBC
HCT: 29.5 % — ABNORMAL LOW (ref 36.0–46.0)
Hemoglobin: 9.8 g/dL — ABNORMAL LOW (ref 12.0–15.0)
MCHC: 33.2 g/dL (ref 30.0–36.0)
RBC: 3.27 MIL/uL — ABNORMAL LOW (ref 3.87–5.11)
WBC: 12.4 10*3/uL — ABNORMAL HIGH (ref 4.0–10.5)

## 2010-07-06 HISTORY — PX: TOTAL KNEE ARTHROPLASTY: SHX125

## 2010-07-06 LAB — CBC
Platelets: 200 10*3/uL (ref 150–400)
RBC: 3.42 MIL/uL — ABNORMAL LOW (ref 3.87–5.11)
RDW: 14.7 % (ref 11.5–15.5)
WBC: 9.2 10*3/uL (ref 4.0–10.5)

## 2010-07-06 LAB — PROTIME-INR
INR: 2.01 — ABNORMAL HIGH (ref 0.00–1.49)
Prothrombin Time: 22.9 seconds — ABNORMAL HIGH (ref 11.6–15.2)

## 2010-07-07 NOTE — Op Note (Signed)
NAMEARACELIS, ULREY NO.:  [id number]  MEDICAL RECORD NO.:  [id number]           PATIENT TYPE:  I  LOCATION:  5021                         FACILITY:  [facility]  PHYSICIAN:  Feliberto Gottron. Turner Daniels, M.D.   DATE OF BIRTH:  07/23/1940  DATE OF PROCEDURE:  07/03/2010 DATE OF DISCHARGE:                              OPERATIVE REPORT   PREOPERATIVE DIAGNOSIS:  End-stage arthritis, right knee.  POSTOPERATIVE DIAGNOSIS:  End-stage arthritis, right knee.  PROCEDURE:  Right total knee arthroplasty using DePuy Sigma RP components, 3 femur, 3 tibia, 10-mm Sigma RP bearing, 5-mm patellar button, double batch of DePuy HV cement with 1500 mg of Zinacef.  SURGEON:  Feliberto Gottron. Turner Daniels, MD  FIRST ASSISTANT:  Shirl Harris, PA  ANESTHETIC:  General endotracheal.  ESTIMATED BLOOD LOSS:  Minimal.  FLUID REPLACEMENT:  Crystalloid 1500 mL.  DRAINS PLACED:  Foley catheter and two medium Hemovac.  URINE OUTPUT:  300 mL.  TOURNIQUET TIME:  One hour and 25 minutes.  INDICATIONS FOR PROCEDURE:  The patient is a 70 year old woman with end- stage arthritis of right greater than left knee medial and patellofemoral compartment.  She has failed conservative treatment, anti- inflammatory medicines, physical therapy, cortisone injections, and Viscoat supplementation.  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  X-ray showed bone-on-bone arthritic changes to the patellofemoral compartment and medial compartment.  She desires elective right total knee arthroplasty.  Risks and benefits of surgery have been discussed, questions answered.  DESCRIPTION OF PROCEDURE:  The patient identified by armband, received preoperative IV antibiotics in the holding area at [facility] and a right femoral nerve block, taken to the operating room 1. Appropriate anesthetic monitors were attached.   Endotracheal anesthesia induced with the patient in supine position.  Foley catheter inserted. Posterolateral post applied to the table.  Tourniquet applied high to the right thigh.  Right lower extremity prepped and draped in usual sterile fashion from the ankle to the tourniquet.  Time-out procedure was performed.  Limb wrapped with an Esmarch bandage.  Tourniquet inflated to 350 mmHg and began the operation by making the anterior midline incision starting a handbreadth above the patella going over the patella and 1 cm medial and 2 and 3 cm distal to the tibial tubercle. Small bleeders in skin and subcutaneous tissue identified and cauterized.  Transverse retinaculum was incised and reflected medially allowing a medial parapatellar arthrotomy.  Patella was everted. Prepatellar fat pad resected.  Superficial medial collateral ligament elevated from anterior to posterior off the proximal medial flare of the tibia allowing [place] to evert the patella, hyperflex the knee, exposing the arthritic joint, bare bone medially, and in the patellofemoral region. A lateral Hohmann retractor was placed, a Michaela retractor through the notch, and a posteromedial Z-retractor was placed in the proximal tibia in line with the axis of the tibial canal with a step drill followed by the IM guide and a 2-degree posterior slope distal femoral cutting guide was pinned into place allowing removal of 3-4 mm of bone medially, 8-9 mm of bone laterally.  Satisfied with the proximal tibial cut, we entered the distal femur 2 mm anterior to the PCL origin with a step drill followed by the IM rod and a distal femoral cutting guide was pinned along the epicondylar axis allowing resection of 11 mm of bone. Distal femoral cut was accomplished.  We sized for a #3 femoral component using the posterior referencing cutting guide, placed in 3 degrees of external rotation, applied the chamfer cutting guide, and performed the  anterior, posterior, and chamfer cuts without difficulty followed by the Sigma RP box cutting guide and box cut which was pinned into place.  The knee was brought into full extension at this point.  We checked our extension gap and flexion gaps, they were good for 10-mm bearing, and the posterior horns of the menisci were resected at this point.  Patella was measured at 23 mm, cut to size for a 35 button, set the cutting guide at 15, removed the posterior 8.5 mm, the patella sized for a 35 button and drilled.  The knee was then hyperflexed exposing the proximal tibia again which was sized to a #3 tibial baseplate.  This was pinned into place followed by the smokestack conical reamer and the Delta fin keel punch.  We then hammered into place a three right trial femoral component, drilled the lugs, inserted the 10-mm bearing and a 35 trial patella, reduced the knee, brought to full extension and full flexion.  No thumb pressure was needed to get the patella to track stably.  Collateral ligaments were also stable.  At this point, the trial components were removed.  All bony surfaces were water picked, cleaned, dried with suction and sponges.  Double batch of DePuy HV cement was mixed with 1500 mg of Zinacef, applied to all bony metallic mating surfaces except for the posterior condyles of the femur itself. In order, we hammered into place a #3 tibial baseplate and removed excess cement, 3 right femoral component and removed excess cement, 35- mm patellar button was squeezed into place and excess cement removed. The 10-mm bearing was inserted.  The knee was brought to full extension and held with compression at the cement.  The wound was water picked clean one more time.  Drains were inserted from anterolateral approach. After the cement had cured, we checked our tracking and ligament stability which was once again excellent.  We then closed the parapatellar arthrotomy with running #1 Vicryl  suture, the subcutaneous tissue with 0 and 2-0 undyed Vicryl suture, and skin with skin staples. Dressing of Xeroform, 4 x 4 dressing, sponges, Webril, and Ace wrap applied.  Tourniquet let down.  The patient awakened and extubated and taken to the recovery room without difficulty.     Feliberto Gottron. Turner Daniels, M.D.     Ovid Curd  D:  07/03/2010  T:  07/04/2010  Job:  [id number]  Electronically Signed by Gean Birchwood M.D. on 07/07/2010 05:38:03 AM

## 2010-07-14 NOTE — Discharge Summary (Signed)
[name], [name] NO.:  [id number]  MEDICAL RECORD NO.:  [id number]           PATIENT TYPE:  I  LOCATION:  5021                         FACILITY:  [facility]  PHYSICIAN:  Feliberto Gottron. Turner Daniels, M.D.   DATE OF BIRTH:  [birth year]  DATE OF ADMISSION:  07/03/2010 DATE OF DISCHARGE:  07/06/2010                              DISCHARGE SUMMARY   CHIEF COMPLAINT:  Right knee pain.  HISTORY OF PRESENT ILLNESS:  This is a 70 year old lady who complains of severe unremitting pain in her right knee despite extensive conservative treatment with arthroscopic decompression, steroid injections, and anti- inflammatory medicines.  She now desires a surgical intervention.  All risks and benefits of surgery were discussed with the patient.  PAST MEDICAL HISTORY:  Significant for high cholesterol, cataracts, and depression.  She also has seizure disorder.  PAST SURGICAL HISTORY:  Significant for cataract surgery and kneearthroscopy.  ALLERGIES:  She has no known drug allergies.  SOCIAL HISTORY:  She denies use of alcohol or tobacco.  She is divorced and lives with her brother.  IMAGING:  X-ray demonstrates end-stage arthritis in the medial and patellofemoral compartments of the right knee.  PHYSICAL EXAMINATION:  EXTREMITIES:  Gross examination of the right knee demonstrates range of motion to be 5-120 degrees.  She is tender to palpation along the medial joint line and has patellofemoral crepitance. SKIN:  Intact. NEUROVASCULAR:  Within normal limits.  PREOPERATIVE LABORATORY DATA:  White blood cells 6.4, red blood cells 4.77, hemoglobin 14.2, hematocrit 43.1, platelets 203.  PT 13.6, INR 1.02, PTT 25.  Sodium 139, potassium 3.9, chloride 104, glucose 87, BUN 23, creatinine 1.18.  Urinalysis was within normal limits.  HOSPITAL COURSE:  [name] was admitted to [facility] on July 03, 2010, where she underwent right total knee arthroplasty.  The procedure was performed by  Dr. Gean Birchwood and the patient tolerated it well.  Two perioperative Hemovac drains were placed into the right knee.  A Foley catheter was also placed.  She was transferred to the floor on Lovenox and Coumadin for DVT prophylaxis.  On the first postoperative day, she was awake and alert.  Hemoglobin was 11.1.  Foley catheter was removed after physical therapy.  On postoperative day 2, her dressing was changed, her incision was found to be benign.  Hemoglobin was 9.8.  On postoperative day 3, her hemoglobin remained at 9.8.  She was eating well and slowly progressing with physical therapy, so she was discharged to a skilled nursing facility.  DISPOSITION:  The patient was discharged on July 06, 2010.  She is weightbearing as tolerated.  Discharge medicines as per the HMR with the addition of tramadol 50 mg to use as needed for pain.  She will be on Coumadin with a target INR of 1.5 to 2.  Skilled nursing home manage her physical therapy.  Follow up with Dr. Turner Daniels will be in 10 days for x-rays and staple removal.  FINAL DIAGNOSIS:  End-stage degenerative joint disease of the right knee.     Cassidy Harris, PA   ______________________________ Feliberto Gottron. Turner Daniels, M.D.    JW/MEDQ  D:  07/06/2010  T:  07/06/2010  Job:  [id number]  Electronically Signed by Cassidy Harris PA on 07/14/2010 10:35:07 AM Electronically Signed by Gean Birchwood M.D. on 07/14/2010 05:00:11 PM

## 2010-08-07 ENCOUNTER — Other Ambulatory Visit (INDEPENDENT_AMBULATORY_CARE_PROVIDER_SITE_OTHER): Payer: PRIVATE HEALTH INSURANCE

## 2010-08-07 ENCOUNTER — Ambulatory Visit (INDEPENDENT_AMBULATORY_CARE_PROVIDER_SITE_OTHER): Payer: Medicaid Other | Admitting: Internal Medicine

## 2010-08-07 ENCOUNTER — Encounter: Payer: Self-pay | Admitting: Internal Medicine

## 2010-08-07 ENCOUNTER — Other Ambulatory Visit (INDEPENDENT_AMBULATORY_CARE_PROVIDER_SITE_OTHER): Payer: PRIVATE HEALTH INSURANCE | Admitting: Internal Medicine

## 2010-08-07 ENCOUNTER — Telehealth: Payer: Self-pay | Admitting: Internal Medicine

## 2010-08-07 DIAGNOSIS — E785 Hyperlipidemia, unspecified: Secondary | ICD-10-CM

## 2010-08-07 DIAGNOSIS — Z13220 Encounter for screening for lipoid disorders: Secondary | ICD-10-CM

## 2010-08-07 DIAGNOSIS — F329 Major depressive disorder, single episode, unspecified: Secondary | ICD-10-CM

## 2010-08-07 DIAGNOSIS — R569 Unspecified convulsions: Secondary | ICD-10-CM

## 2010-08-07 LAB — LIPID PANEL
Cholesterol: 239 mg/dL — ABNORMAL HIGH (ref 0–200)
Triglycerides: 102 mg/dL (ref 0.0–149.0)

## 2010-08-07 MED ORDER — ESZOPICLONE 3 MG PO TABS: 3.0000 mg | ORAL_TABLET | Freq: Every day | ORAL | Status: DC

## 2010-08-07 MED ORDER — VENLAFAXINE HCL ER 75 MG PO CP24: 75.0000 mg | ORAL_CAPSULE | Freq: Every day | ORAL | Status: DC

## 2010-08-07 MED ORDER — SIMVASTATIN 20 MG PO TABS: 20.0000 mg | ORAL_TABLET | Freq: Every evening | ORAL | Status: DC
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
## 2010-08-07 NOTE — Assessment & Plan Note (Signed)
Previously on statin - stopped due to $$$ Recheck now - resume if needed

## 2010-08-07 NOTE — Assessment & Plan Note (Signed)
status post TKR 07/06/2010 - dr. Turner Daniels Cont PT as ongoing - pain but not swelling improved (chronic edema)

## 2010-08-07 NOTE — Assessment & Plan Note (Signed)
Chronic divalproex - no breakthru sz since 2005 - The current medical regimen is effective;  continue present plan and medications.

## 2010-08-07 NOTE — Telephone Encounter (Signed)
Please call patient - high cholesterol test results. following medication changes recommended: start simvastatin 20mg  qhs - erx done - call if problems or SE on tx - recheck OV with labs in 3-6 mo. Thanks.

## 2010-08-07 NOTE — Assessment & Plan Note (Signed)
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

## 2010-08-07 NOTE — Patient Instructions (Signed)
It was good to see you today. Medications reviewed, no changes at this time - Refill on medication(s) as discussed today. Test(s) ordered today. Your results will be called to you after review (48-72hours after test completion). If any changes need to be made, you will be notified at that time. Please schedule followup in 6 months, call sooner if problems.

## 2010-08-08 NOTE — Telephone Encounter (Signed)
Pt Notified with lab results...08/08/10@10 :09am/LMB

## 2010-08-18 NOTE — Discharge Summary (Signed)
[place]  Patient:    [name], [name]                MRN: [id number] Adm. Date:  [id number] Disc. Date: [id number] Attending:  Alfonso Ramus Dictator:   Trula Slade, M.D. CC:         Rollene Rotunda, M.D. [facility] - FAX [phone]  Moshe Salisbury. Audria Nine, M.D.  - [facility]   Discharge Summary  CONSULTANT:  Dr. Rollene Rotunda, [facility].  DISCHARGE MEDICATIONS: 1. Aspirin 81 mg q.d. 2. Phenobarbital 100 mg q.d. 3. Percocet p.r.n. 4. Celebrex p.r.n.  DISCHARGE INSTRUCTIONS: 1. The patient is to follow up with Dr. Moshe Salisbury. McPherson at [facility]    in two to three weeks. 2. She has a Cardiolyte scheduled for Thursday, September 05, 2000, at 12 noon    at [facility].  DISCHARGE DIAGNOSES: 1. Chest pain:    a. Ruled out for acute myocardial infarction with serial enzymes and       electrocardiograms.    b. Due to risk factors, to be followed up with a noninvasive ischemic       study.    c. Possible anxiety component to her pain. 2. Anxiety. 3. History of a seizure disorder. 4. Degenerative joint disease.  HISTORY OF PRESENT ILLNESS:  [name] is a 70 year old female with a history of an epileptic seizure disorder for 29 years, and unspecified heart valve problem.  She was admitted through the emergency room with a one-week history of atypical chest pain.  She has been under a lot of emotional stress lately, secondary to her job.  Her pain is in her left upper chest and radiates to the left shoulder, with no associated shortness of breath, nausea, or vomiting, but does have mild diaphoresis.  The pain feels like a pressure. The patient also complains of shortness of breath after climbing about 16 stairs.  Her shortness of breath is a stable ongoing problem and has not worsened recently.  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   She has no signs of PND or orthopnea.  She has had stable ankle swelling for years.  CARDIAC RISK FACTORS:  Diagnosed with hyperlipidemia 10 years ago.  No diabetes mellitus, no tobacco use, no family history, no previous personal history of coronary artery disease.  PHYSICAL EXAMINATION:  VITAL SIGNS:  Temperature 99.5 degrees, heart rate 82, blood pressure 108/65, respirations 18.  O2 saturation 98%-99% on room air.  CARDIOVASCULAR:  A regular rate and rhythm with a midsystolic click.  LUNGS:  Clear to auscultation bilaterally.  EXTREMITIES:  With +1 bilateral ankle edema, nonpitting.  ADMISSION LABORATORY DATA:  Sodium 139, potassium 3.6, chloride 108, CO2 of 26, BUN 14, creatinine 1.0, glucose 88.  White blood cell count 5.4, hemoglobin 13.6, ________ 271.  Admitting electrocardiogram:  Normal sinus rhythm, normal intervals. Nonspecific ST-T wave changes.  Admitting portable chest x-ray:  No acute disease.  HOSPITAL COURSE:  The patient was admitted to telemetry and serial enzymes and electrocardiograms.  The patient was started on a proton pump inhibitor empirically for GERD.  The patients three sets of cardiac enzymes were all normal.  The repeat electrocardiogram was also normal.  The patient was pain-free on the morning of the first.  A lipid panel was pending at the time of dictation.  The patient was maintained on an aspirin 81 mg q.d.  Cardiology was consulted and thought that the patient would benefit from a Cardiolyte but did not need to stay in the hospital to have this done.  The Cardiolyte was not scheduled over the weekend.  The patient was discharged to home, and her outpatient Cardiolyte was scheduled.  It was felt that [name] anxiety might have an effect on her chest pain.  We did not start her on benzodiazepines in the hospital, as we thought this would be better instituted by her primary care physician.  The patient also had nonpitting edema  which was chronic in nature, and has not changed.  Her TSH was normal.  She had no other signs or symptoms of CHF, so a 2-D echocardiogram was not obtained.  Her ejection fraction will be able to be determined on a Cardiolyte.  The patient has a history of seizure disorder, and is continued on her current doses of medications.  DISPOSITION:  The patient was felt to be stable to be discharged on August 31, 2000, with followup as above.DD:  09/02/00 TD:  09/02/00 Job: [id number]

## 2010-08-18 NOTE — Op Note (Signed)
[name], KOMAR         ACCOUNT NO.:  [id number]   MEDICAL RECORD NO.:  [id number]          PATIENT TYPE:  AMB   LOCATION:  ENDO                         FACILITY:  [facility]   PHYSICIAN:  James L. Malon Kindle., M.D.DATE OF BIRTH:  [birth year]   DATE OF PROCEDURE:  09/18/2005  DATE OF DISCHARGE:                                 OPERATIVE REPORT   PROCEDURE:  Colonoscopy.   MEDICATIONS:  Fentanyl 100 mcg, Versed 7.5 mg IV.   INDICATIONS:  Colon cancer screening.   DESCRIPTION OF PROCEDURE:  The procedure has been explained to the patient  in the office and consent obtained.  In the left lateral decubitus position,  the Olympus pediatric adjustable scope was inserted and advanced.  The prep  was adequate.  We were able to advance to the cecum using abdominal pressure  and position changes.  The ileocecal valve and appendiceal orifice were  seen.  The scope was withdrawn in the cecum.  The ascending colon,  transverse colon, descending and sigmoid colon were seen well.  No polyps or  other lesions were seen.  The rectum was free of polyps.  The scope was  withdrawn.  The patient tolerated the procedure well.   ASSESSMENT:  Normal screening colonoscopy.  V76.51.   PLAN:  Routine followup with resumption of Hemoccults in five years and a  questionable repeat colonoscopy in 10 years, depending on the  recommendations at that time.           ______________________________  Llana Aliment Malon Kindle., M.D.     Waldron Session  D:  09/18/2005  T:  09/18/2005  Job:  [id number]   cc:   Morley Kos, M.D.  Fax: [phone]

## 2010-08-18 NOTE — Op Note (Signed)
NAMESAMIKSHA, PELLICANO NO.:  [id number]   MEDICAL RECORD NO.:  [id number]          PATIENT TYPE:  AMB   LOCATION:  SDS                          FACILITY:  [facility]   PHYSICIAN:  Myrtie Neither, MD      DATE OF BIRTH:  11-07-1940   DATE OF PROCEDURE:  12/26/2005  DATE OF DISCHARGE:  12/26/2005                                 OPERATIVE REPORT   PREOPERATIVE DIAGNOSIS:  Internal derangement right knee.   POSTOPERATIVE DIAGNOSES:  1. Chronic synovitis right knee medial and lateral compartment.  2. Chondral defect with loose bodies medial femoral condyle and partial      tear anterior cruciate ligament, degenerative.   The patient was taken to the operating room after given adequate preop  medications and given general anesthesia and intubated.  Right knee was  prepped with DuraPrep and draped in a sterile manner.  Tourniquet used for  hemostasis.  One-half inch puncture wound was made along the anteromedial  and lateral joint line.  Inflow was through the medial suprapatellar pouch  area.  Inspection of the joint revealed tremendously hypertrophic synovial  thickening involving both medial and lateral compartment, encompassed the  entire anterior compartment of the knee with a large fibrotic plica.  A  large chondral defect involving the medial femoral condyle approximately the  size of a 50 cent piece.  There are osteophytes about the medial and lateral  femoral condyles.  Complete synovectomy of both medial and lateral  compartments and suprapatellar pouch was done.  Chondroplasty of the medial  femoral condyle was done with the shaver and followed by use of arthroscopic  rasp.  Loose fragments were removed with the synovial shaver.  Medial and  lateral menisci was still intact.  ACL had degenerative partial disruption.  After removal of loose fragments and debridement of the knee, wound closure  was then done with 4-0 nylon.  Thirteen mL of 0.25% Marcaine with  epinephrine was injected to the knee; compressive dressing was applied.  The  patient tolerated the procedure quite well and went to recovery room in  stable and satisfactory condition.  The patient is being discharged home on  Percocet 1-2 q. 4 p.m. for pain, ice packs, partial weightbearing with use  of crutches on the right side and to return to the office in 1 week.  The  patient is being discharged in stable and satisfactory condition.      Myrtie Neither, MD  Electronically Signed     AC/MEDQ  D:  12/26/2005  T:  12/27/2005  Job:  [id number]

## 2010-09-18 ENCOUNTER — Ambulatory Visit (INDEPENDENT_AMBULATORY_CARE_PROVIDER_SITE_OTHER): Payer: Medicaid Other | Admitting: Internal Medicine

## 2010-09-18 ENCOUNTER — Encounter: Payer: Self-pay | Admitting: Internal Medicine

## 2010-09-18 VITALS — BP 148/88 | HR 96 | Temp 97.5°F | Ht 63.0 in | Wt 183.8 lb

## 2010-09-18 DIAGNOSIS — R091 Pleurisy: Secondary | ICD-10-CM

## 2010-09-18 DIAGNOSIS — R079 Chest pain, unspecified: Secondary | ICD-10-CM

## 2010-09-18 MED ORDER — ESZOPICLONE 3 MG PO TABS: 3.0000 mg | ORAL_TABLET | Freq: Every day | ORAL | Status: DC

## 2010-09-18 MED ORDER — HYDROCODONE-HOMATROPINE 5-1.5 MG/5ML PO SYRP: 5.0000 mL | ORAL_SOLUTION | Freq: Four times a day (QID) | ORAL | Status: AC | PRN

## 2010-09-18 NOTE — Patient Instructions (Signed)
It was good to see you today. We have reviewed your prior records including labs and tests today Continue the naprosyn for your chest pain (pleurisy) and use cough syrup at bedtime to help cough symptoms related to this -  Refill on medication(s) as discussed today. Continue the Vitamin D as reviewed

## 2010-09-18 NOTE — Progress Notes (Signed)
  Subjective:    Patient ID: [name], female    DOB: 1940/11/15, 70 y.o.   MRN: [id number]  HPI  Multiple concerns -  complains of chest pain  Onset 2 week ago - declined ER eval last week Intermittent pain for 2 weeks but resolved 2 days ago - (started NSAIDs 4 days ago - naprosyn) Pain was located in B anterior chest Pain exac with inspiration or cough - not exertional No radiation of pain into neck/arms or back - Describes as tearing or "pinching" pain No fever, no nausea and vomiting - no recent travel +dry cough from URI 2 weeks ago Poor sleep ongoing due to cough but chronic issues - requests renewal on lunesta  Past Medical History  Diagnosis Date  . INSOMNIA-SLEEP DISORDER-UNSPEC   . KNEE PAIN, BILATERAL   . OSTEOPENIA   . LEG EDEMA   . Headache   . RHINITIS, ALLERGIC   . GRAVES' DISEASE   . CONVULSIONS, SEIZURES, NOS   . Fibromyalgia   . HYPERLIPIDEMIA   . GASTROESOPHAGEAL REFLUX, NO ESOPHAGITIS   . DEPRESSION   . OSTEOARTHRITIS, LOWER LEG     R TKR 07/2010    Review of Systems  Constitutional: Positive for fatigue. Negative for chills.  Respiratory: Negative for stridor.   Cardiovascular: Negative for leg swelling.  Neurological: Negative for syncope.       Objective:   Physical Exam BP 148/88  Pulse 96  Temp(Src) 97.5 F (36.4 C) (Oral)  Ht 5\' 3"  (1.6 m)  Wt 183 lb 12.8 oz (83.371 kg)  BMI 32.56 kg/m2  SpO2 96% Physical Exam  Constitutional: She is oriented to person, place, and time. She appears well-developed and well-nourished. No distress.  Neck: Normal range of motion. Neck supple. No JVD present. No thyromegaly present.  Cardiovascular: Normal rate, regular rhythm and normal heart sounds.  No murmur heard. Chronic BLE soft 1+ edema, R>L Pulmonary/Chest: Effort normal and breath sounds normal. No respiratory distress. She has no wheezes.  Musculoskeletal: Normal range of motion. She exhibits no edema.  R knee with well healed  incision  Neurological: She is alert and oriented to person, place, and time. No cranial nerve deficit. Coordination normal.  Skin: Skin is warm and dry. No rash noted. No erythema.  Psychiatric: She has a normal mood and affect. Her behavior is normal. Judgment and thought content normal.       Lab Results  Component Value Date   WBC 9.2 07/06/2010   HGB 9.8* 07/06/2010   HCT 31.0* 07/06/2010   PLT 200 07/06/2010   CHOL 239* 08/07/2010   TRIG 102.0 08/07/2010   HDL 43.20 08/07/2010   LDLDIRECT 172.2 08/07/2010   ALT 16 09/01/2009   AST 19 09/01/2009   NA 137 07/04/2010   K 3.8 07/04/2010   CL 100 07/04/2010   CREATININE 0.91 07/04/2010   BUN 8 07/04/2010   CO2 30 07/04/2010   TSH 1.186 03/10/2008   INR 2.01* 07/06/2010   Assessment & Plan:  Chest pain, pleuritic associated with dry cough  = pleurisy. Exam otherwise benign today. EKG today - poor R wave progression without change - reviewed 06/26/10 EKG in system - pt has begun tx with NSAIDs likely explaining resolution of pain in past 2-3 days - add narcotic cough syrup for cough control qhs and cont NSAIDs x 1 week more, then prn

## 2010-11-14 ENCOUNTER — Encounter: Payer: Self-pay | Admitting: Internal Medicine

## 2010-12-07 ENCOUNTER — Ambulatory Visit
Payer: PRIVATE HEALTH INSURANCE | Attending: Orthopedic Surgery | Admitting: Rehabilitative and Restorative Service Providers"

## 2010-12-07 DIAGNOSIS — M25569 Pain in unspecified knee: Secondary | ICD-10-CM | POA: Insufficient documentation

## 2010-12-07 DIAGNOSIS — IMO0001 Reserved for inherently not codable concepts without codable children: Secondary | ICD-10-CM | POA: Insufficient documentation

## 2010-12-07 DIAGNOSIS — M6281 Muscle weakness (generalized): Secondary | ICD-10-CM | POA: Insufficient documentation

## 2010-12-13 ENCOUNTER — Ambulatory Visit: Payer: PRIVATE HEALTH INSURANCE | Admitting: Rehabilitative and Restorative Service Providers"

## 2010-12-14 ENCOUNTER — Ambulatory Visit: Payer: PRIVATE HEALTH INSURANCE | Admitting: Rehabilitative and Restorative Service Providers"

## 2010-12-15 ENCOUNTER — Ambulatory Visit (INDEPENDENT_AMBULATORY_CARE_PROVIDER_SITE_OTHER): Payer: 59 | Admitting: Internal Medicine

## 2010-12-15 ENCOUNTER — Encounter: Payer: Self-pay | Admitting: Internal Medicine

## 2010-12-15 VITALS — BP 120/72 | HR 98 | Temp 98.6°F | Ht 65.0 in | Wt 193.1 lb

## 2010-12-15 DIAGNOSIS — M7540 Impingement syndrome of unspecified shoulder: Secondary | ICD-10-CM

## 2010-12-15 DIAGNOSIS — Z1211 Encounter for screening for malignant neoplasm of colon: Secondary | ICD-10-CM

## 2010-12-15 NOTE — Progress Notes (Signed)
  Subjective:    Patient ID: [name], female    DOB: 02/08/1941, 70 y.o.   MRN: [id number]  HPI complains of shoulder pain, bilateral Onset 2 months ago - gradual Denies precipitating injury or overuse Pain worst with overhead motion, L>R No neck pain, weakness or numbness Use OTC meds for pain - min improved symptoms   Past Medical History  Diagnosis Date  . INSOMNIA-SLEEP DISORDER-UNSPEC   . KNEE PAIN, BILATERAL   . OSTEOPENIA   . LEG EDEMA   . Headache   . RHINITIS, ALLERGIC   . GRAVES' DISEASE   . CONVULSIONS, SEIZURES, NOS   . Fibromyalgia   . HYPERLIPIDEMIA   . GASTROESOPHAGEAL REFLUX, NO ESOPHAGITIS   . DEPRESSION   . OSTEOARTHRITIS, LOWER LEG     R TKR 07/2010    Review of Systems  Constitutional: Negative for fever.  Respiratory: Negative for cough and shortness of breath.   Cardiovascular: Negative for chest pain and palpitations.       Objective:   Physical Exam BP 120/72  Pulse 98  Temp(Src) 98.6 F (37 C) (Oral)  Ht 5\' 5"  (1.651 m)  Wt 193 lb 1.9 oz (87.599 kg)  BMI 32.14 kg/m2  SpO2 95% Constitutional: She is overweight. She appears well-developed and well-nourished. No distress.  Neck: Normal range of motion. Neck supple. No JVD present. No thyromegaly present.  Cardiovascular: Normal rate, regular rhythm and normal heart sounds.  No murmur heard. No BLE edema. Pulmonary/Chest: Effort normal and breath sounds normal. No respiratory distress. She has no wheezes.  Musculoskeletal: bilateral shoulders with decreased range of motion on forward flexion, abduction, and internal rotation. Positive impingement signs. Decreased strength with stressing of rotator cuff. Pain with crossed arm adduction. referred pain into distal deltoid. Tender over a.c. joint and subacromial. Neurological: She is alert and oriented to person, place, and time. No cranial nerve deficit. Coordination normal.  Psychiatric: She has a normal mood and affect. Her behavior  is normal. Judgment and thought content normal.   Lab Results  Component Value Date   WBC 9.2 07/06/2010   HGB 9.8* 07/06/2010   HCT 31.0* 07/06/2010   PLT 200 07/06/2010   CHOL 239* 08/07/2010   TRIG 102.0 08/07/2010   HDL 43.20 08/07/2010   LDLDIRECT 172.2 08/07/2010   ALT 16 09/01/2009   AST 19 09/01/2009   NA 137 07/04/2010   K 3.8 07/04/2010   CL 100 07/04/2010   CREATININE 0.91 07/04/2010   BUN 8 07/04/2010   CO2 30 07/04/2010   TSH 1.186 03/10/2008   INR 2.01* 07/06/2010      Assessment & Plan:  Impingement syndrome, B shoulders - suspect underlying DJD - check xray On NSAIDs already - rec PT and tylenol - will make refer for PT as needed (pt reports already working with PT for knee via ortho)  F/u ortho if unimproved to consider steroid shots - offered same to pt today but she declines due to steroid side effects   Also reports due to colo follow up - refer now

## 2010-12-15 NOTE — Patient Instructions (Signed)
It was good to see you today. Test(s) ordered today. Your results will be called to you after review (48-72hours after test completion). If any changes need to be made, you will be notified at that time. Add tyelnol to current medications, work with physical therapy and call Dr. Turner Daniels worse we'll make referral for colonoscopy. Our office will contact you regarding appointment(s) once made.

## 2010-12-19 ENCOUNTER — Ambulatory Visit: Payer: PRIVATE HEALTH INSURANCE | Admitting: Rehabilitative and Restorative Service Providers"

## 2010-12-21 ENCOUNTER — Ambulatory Visit: Payer: PRIVATE HEALTH INSURANCE | Admitting: Rehabilitative and Restorative Service Providers"

## 2010-12-21 ENCOUNTER — Ambulatory Visit (INDEPENDENT_AMBULATORY_CARE_PROVIDER_SITE_OTHER)
Admission: RE | Admit: 2010-12-21 | Discharge: 2010-12-21 | Disposition: A | Discharge status: Home / Self Care | Payer: 59 | Source: Ambulatory Visit | Attending: Internal Medicine | Admitting: Internal Medicine

## 2010-12-21 DIAGNOSIS — M7540 Impingement syndrome of unspecified shoulder: Secondary | ICD-10-CM

## 2010-12-22 NOTE — Progress Notes (Signed)
Left a message with female at residence for pt to return my call.

## 2010-12-22 NOTE — Progress Notes (Signed)
Message left with female at residence requesting pt return call.

## 2010-12-25 ENCOUNTER — Telehealth: Payer: Self-pay | Admitting: *Deleted

## 2010-12-26 NOTE — Telephone Encounter (Signed)
A user error has taken place. Open wrong chart....12/26/10/LMB

## 2010-12-27 ENCOUNTER — Encounter: Payer: 59 | Admitting: Rehabilitative and Restorative Service Providers"

## 2010-12-28 ENCOUNTER — Encounter: Payer: 59 | Admitting: Rehabilitative and Restorative Service Providers"

## 2010-12-29 ENCOUNTER — Encounter: Payer: Self-pay | Admitting: *Deleted

## 2011-01-02 ENCOUNTER — Encounter: Payer: 59 | Admitting: Rehabilitative and Restorative Service Providers"

## 2011-01-04 ENCOUNTER — Encounter: Payer: 59 | Admitting: Rehabilitative and Restorative Service Providers"

## 2011-02-07 ENCOUNTER — Ambulatory Visit: Payer: PRIVATE HEALTH INSURANCE | Admitting: Internal Medicine

## 2011-02-07 DIAGNOSIS — Z0289 Encounter for other administrative examinations: Secondary | ICD-10-CM

## 2011-02-27 ENCOUNTER — Encounter: Payer: Self-pay | Admitting: Gastroenterology

## 2011-04-11 ENCOUNTER — Ambulatory Visit (INDEPENDENT_AMBULATORY_CARE_PROVIDER_SITE_OTHER): Payer: Medicaid Other | Admitting: Internal Medicine

## 2011-04-11 ENCOUNTER — Encounter: Payer: Self-pay | Admitting: Internal Medicine

## 2011-04-11 DIAGNOSIS — Z1211 Encounter for screening for malignant neoplasm of colon: Secondary | ICD-10-CM

## 2011-04-11 DIAGNOSIS — F329 Major depressive disorder, single episode, unspecified: Secondary | ICD-10-CM

## 2011-04-11 DIAGNOSIS — Z1239 Encounter for other screening for malignant neoplasm of breast: Secondary | ICD-10-CM

## 2011-04-11 DIAGNOSIS — B009 Herpesviral infection, unspecified: Secondary | ICD-10-CM

## 2011-04-11 DIAGNOSIS — E785 Hyperlipidemia, unspecified: Secondary | ICD-10-CM

## 2011-04-11 MED ORDER — SIMVASTATIN 20 MG PO TABS: 20.0000 mg | ORAL_TABLET | Freq: Every evening | ORAL | Status: DC

## 2011-04-11 MED ORDER — VALACYCLOVIR HCL 1 G PO TABS: 1000.0000 mg | ORAL_TABLET | Freq: Every day | ORAL | Status: DC
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
## 2011-04-11 NOTE — Assessment & Plan Note (Signed)
inconsistent statin - reports relateed to $$$ Recheck now -

## 2011-04-11 NOTE — Patient Instructions (Signed)
It was good to see you today. Medications reviewed, no changes at this time - Refill on medication(s) as discussed today. we'll make referral for colonoscopy and mammogram . Our office will contact you regarding appointment(s) once made. Please schedule followup in 6 months for cholesterol check, call sooner if problems.

## 2011-04-11 NOTE — Assessment & Plan Note (Signed)
exac by family stressors (ill mom and brother) - resumed SNRI 08/2010 Support offered  now in indep senior apts and doing well

## 2011-05-02 ENCOUNTER — Ambulatory Visit
Admission: RE | Admit: 2011-05-02 | Discharge: 2011-05-02 | Disposition: A | Discharge status: Home / Self Care | Payer: 59 | Source: Ambulatory Visit | Attending: Internal Medicine | Admitting: Internal Medicine

## 2011-05-02 DIAGNOSIS — Z1239 Encounter for other screening for malignant neoplasm of breast: Secondary | ICD-10-CM

## 2011-05-28 ENCOUNTER — Other Ambulatory Visit: Payer: Self-pay | Admitting: Internal Medicine

## 2011-05-28 NOTE — Telephone Encounter (Signed)
Faxed script back to [facility]... 05/28/11@3 :48pm/LMB

## 2011-05-31 ENCOUNTER — Encounter: Payer: Self-pay | Admitting: Internal Medicine

## 2011-06-18 ENCOUNTER — Telehealth: Payer: Self-pay | Admitting: Internal Medicine

## 2011-06-18 DIAGNOSIS — Z124 Encounter for screening for malignant neoplasm of cervix: Secondary | ICD-10-CM

## 2011-06-18 NOTE — Telephone Encounter (Signed)
Refer done as requested 

## 2011-06-18 NOTE — Telephone Encounter (Signed)
The pt called and is hoping to get a referral to a female gynecologist.  She is hoping for Dr.Cousin (off [facility] [facility]?).  Thank you!

## 2011-06-19 NOTE — Telephone Encounter (Signed)
Notified pt spoke with son left msg md ok referral will received call back once appt has been set-up with appt date & time... 06/19/11@9 :39am/LMB

## 2011-06-21 HISTORY — PX: DOPPLER ECHOCARDIOGRAPHY: SHX263

## 2011-06-26 ENCOUNTER — Ambulatory Visit (AMBULATORY_SURGERY_CENTER): Payer: 59 | Admitting: *Deleted

## 2011-06-26 VITALS — Ht 63.0 in | Wt 197.0 lb

## 2011-06-26 DIAGNOSIS — Z1211 Encounter for screening for malignant neoplasm of colon: Secondary | ICD-10-CM

## 2011-06-26 MED ORDER — PEG-KCL-NACL-NASULF-NA ASC-C 100 G PO SOLR: ORAL | Status: DC

## 2011-07-02 ENCOUNTER — Telehealth: Payer: Self-pay | Admitting: *Deleted

## 2011-07-02 MED ORDER — VALACYCLOVIR HCL 1 G PO TABS: 1000.0000 mg | ORAL_TABLET | Freq: Every day | ORAL | Status: DC

## 2011-07-02 NOTE — Telephone Encounter (Signed)
Yes done 

## 2011-07-02 NOTE — Telephone Encounter (Signed)
Notified pt rx sent to pharmacy.... 07/02/11@3 :54pm/lmb

## 2011-07-02 NOTE — Telephone Encounter (Signed)
Left msg on vm having a outbreak of herpes. Requesting md to call in valtrex to [facility]. Med is not on med list is this ok to send.... 07/02/11@11 :23am/LMB

## 2011-07-10 ENCOUNTER — Encounter: Payer: Self-pay | Admitting: Internal Medicine

## 2011-07-10 ENCOUNTER — Ambulatory Visit (AMBULATORY_SURGERY_CENTER): Payer: 59 | Admitting: Internal Medicine

## 2011-07-10 VITALS — BP 133/68 | HR 73 | Temp 97.6°F | Resp 20 | Ht 63.0 in | Wt 197.0 lb

## 2011-07-10 DIAGNOSIS — Z1211 Encounter for screening for malignant neoplasm of colon: Secondary | ICD-10-CM

## 2011-07-10 MED ORDER — SODIUM CHLORIDE 0.9 % IV SOLN: 500.0000 mL | INTRAVENOUS | Status: DC

## 2011-07-10 NOTE — Patient Instructions (Signed)
YOU HAD AN ENDOSCOPIC PROCEDURE TODAY AT THE [facility]: Refer to the procedure report that was given to you for any specific questions about what was found during the examination.  If the procedure report does not answer your questions, please call your gastroenterologist to clarify.  If you requested that your care partner not be given the details of your procedure findings, then the procedure report has been included in a sealed envelope for you to review at your convenience later.  YOU SHOULD EXPECT: Some feelings of bloating in the abdomen. Passage of more gas than usual.  Walking can help get rid of the air that was put into your GI tract during the procedure and reduce the bloating. If you had a lower endoscopy (such as a colonoscopy or flexible sigmoidoscopy) you may notice spotting of blood in your stool or on the toilet paper. If you underwent a bowel prep for your procedure, then you may not have a normal bowel movement for a few days.  DIET: Your first meal following the procedure should be a light meal and then it is ok to progress to your normal diet.  A half-sandwich or bowl of soup is an example of a good first meal.  Heavy or fried foods are harder to digest and may make you feel nauseous or bloated.  Likewise meals heavy in dairy and vegetables can cause extra gas to form and this can also increase the bloating.  Drink plenty of fluids but you should avoid alcoholic beverages for 24 hours.  ACTIVITY: Your care partner should take you home directly after the procedure.  You should plan to take it easy, moving slowly for the rest of the day.  You can resume normal activity the day after the procedure however you should NOT DRIVE or use heavy machinery for 24 hours (because of the sedation medicines used during the test).    SYMPTOMS TO REPORT IMMEDIATELY: A gastroenterologist can be reached at any hour.  During normal business hours, 8:30 AM to 5:00 PM Monday through Friday,  call [phone].  After hours and on weekends, please call the GI answering service at [phone] who will take a message and have the physician on call contact you.   Following lower endoscopy (colonoscopy or flexible sigmoidoscopy):  Excessive amounts of blood in the stool  Significant tenderness or worsening of abdominal pains  Swelling of the abdomen that is new, acute  Fever of 100F or higher  Following upper endoscopy (EGD)  Vomiting of blood or coffee ground material  New chest pain or pain under the shoulder blades  Painful or persistently difficult swallowing  New shortness of breath  Fever of 100F or higher  Black, tarry-looking stools  FOLLOW UP: If any biopsies were taken you will be contacted by phone or by letter within the next 1-3 weeks.  Call your gastroenterologist if you have not heard about the biopsies in 3 weeks.  Our staff will call the home number listed on your records the next business day following your procedure to check on you and address any questions or concerns that you may have at that time regarding the information given to you following your procedure. This is a courtesy call and so if there is no answer at the home number and we have not heard from you through the emergency physician on call, we will assume that you have returned to your regular daily activities without incident.  SIGNATURES/CONFIDENTIALITY: You and/or your care   partner have signed paperwork which will be entered into your electronic medical record.  These signatures attest to the fact that that the information above on your After Visit Summary has been reviewed and is understood.  Full responsibility of the confidentiality of this discharge information lies with you and/or your care-partner.  

## 2011-07-10 NOTE — Progress Notes (Signed)
Patient did not experience any of the following events: a burn prior to discharge; a fall within the facility; wrong site/side/patient/procedure/implant event; or a hospital transfer or hospital admission upon discharge from the facility. (G8907) Patient did not have preoperative order for IV antibiotic SSI prophylaxis. (G8918)  

## 2011-07-10 NOTE — Op Note (Signed)
[facility] [address] [place], [place]  [address]  COLONOSCOPY PROCEDURE REPORT  PATIENT:  Cassidy, [name]  MR#:  [id number] BIRTHDATE:  04-05-1940, 70 yrs. old  GENDER:  female ENDOSCOPIST:  Iva Boop, MD, [facility] REF. BY:  Rene Paci, M.D. PROCEDURE DATE:  07/10/2011 PROCEDURE:  Colonoscopy 19147 ASA CLASS:  Class II INDICATIONS:  Routine Risk Screening MEDICATIONS:   These medications were titrated to patient response per physician's verbal order, Fentanyl 75 mcg IV, Versed 7 mg IV  DESCRIPTION OF PROCEDURE:   After the risks benefits and alternatives of the procedure were thoroughly explained, informed consent was obtained.  Digital rectal exam was performed and revealed no abnormalities.   The LB 180AL [id number] endoscope was introduced through the anus and advanced to the cecum, which was identified by both the appendix and ileocecal valve, without limitations.  The quality of the prep was excellent, using MoviPrep.  The instrument was then slowly withdrawn as the colon was fully examined. <<PROCEDUREIMAGES>>  FINDINGS:  A normal appearing cecum, ileocecal valve, and appendiceal orifice were identified. The ascending, hepatic flexure, transverse, splenic flexure, descending, sigmoid colon, and rectum appeared unremarkable.   Retroflexed views in the rectum revealed no abnormalities.    The time to cecum = 2:05 minutes. The scope was then withdrawn in 7:53 minutes from the cecum and the procedure completed. COMPLICATIONS:  None ENDOSCOPIC IMPRESSION: 1) Normal colonoscopy  REPEAT EXAM:  In 10 year(s) for routine screening colonoscopy.  Iva Boop, MD, [facility]  CC:  Newt Lukes, MD and The Patient  n. eSIGNED:   Iva Boop at 07/10/2011 04:01 PM  Sima Matas, [phone]

## 2011-07-11 ENCOUNTER — Telehealth: Payer: Self-pay | Admitting: *Deleted

## 2011-07-11 NOTE — Telephone Encounter (Signed)
  Follow up Call-  Call back number 07/10/2011  Post procedure Call Back phone  # [phone]  Permission to leave phone message Yes     Patient questions:  Do you have a fever, pain , or abdominal swelling? no Pain Score  0 *  Have you tolerated food without any problems? yes  Have you been able to return to your normal activities? yes  Do you have any questions about your discharge instructions: Diet   no Medications  no Follow up visit  no  Do you have questions or concerns about your Care? no  Actions: * If pain score is 4 or above: No action needed, pain <4.

## 2011-07-19 ENCOUNTER — Other Ambulatory Visit: Payer: 59 | Admitting: Internal Medicine

## 2011-07-21 HISTORY — PX: OTHER SURGICAL HISTORY: SHX169

## 2011-08-01 ENCOUNTER — Ambulatory Visit (INDEPENDENT_AMBULATORY_CARE_PROVIDER_SITE_OTHER)
Admission: RE | Admit: 2011-08-01 | Discharge: 2011-08-01 | Disposition: A | Discharge status: Home / Self Care | Payer: 59 | Source: Ambulatory Visit | Attending: Internal Medicine | Admitting: Internal Medicine

## 2011-08-01 ENCOUNTER — Ambulatory Visit (INDEPENDENT_AMBULATORY_CARE_PROVIDER_SITE_OTHER): Payer: 59 | Admitting: Internal Medicine

## 2011-08-01 ENCOUNTER — Encounter: Payer: Self-pay | Admitting: Internal Medicine

## 2011-08-01 VITALS — BP 120/70 | HR 72 | Temp 97.7°F | Ht 62.0 in | Wt 198.0 lb

## 2011-08-01 DIAGNOSIS — J309 Allergic rhinitis, unspecified: Secondary | ICD-10-CM

## 2011-08-01 DIAGNOSIS — R05 Cough: Secondary | ICD-10-CM

## 2011-08-01 MED ORDER — FAMOTIDINE 20 MG PO TABS: 20.0000 mg | ORAL_TABLET | Freq: Two times a day (BID) | ORAL | Status: DC

## 2011-08-01 MED ORDER — GUAIFENESIN ER 600 MG PO TB12: 1200.0000 mg | ORAL_TABLET | Freq: Two times a day (BID) | ORAL | Status: DC

## 2011-08-01 MED ORDER — LORATADINE 10 MG PO TABS: 10.0000 mg | ORAL_TABLET | Freq: Every day | ORAL | Status: DC | PRN

## 2011-08-01 MED ORDER — VALACYCLOVIR HCL 1 G PO TABS: 1000.0000 mg | ORAL_TABLET | Freq: Every day | ORAL | Status: DC

## 2011-08-01 NOTE — Patient Instructions (Signed)
It was good to see you today. Test(s) ordered today. Your results will be called to you after review (48-72hours after test completion). If any changes need to be made, you will be notified at that time. Use claritin every day for next 2 weeks, Pepcid 2x/day x 2 weeks and Mucinex 2x/day x 2 weeks -  Refill on medication(s) as discussed today.  Call us if symptoms worse or unimproved

## 2011-08-01 NOTE — Progress Notes (Signed)
  Subjective:    Patient ID: [name], female    DOB: [birth year], 71 y.o.   MRN: [id number]  HPI  complains of cough Describes as dry cough Onset 2 mo ago associated with shortness of breath during cough spells No chest pain or fever Not improved with OTC meds  Past Medical History  Diagnosis Date  . INSOMNIA-SLEEP DISORDER-UNSPEC   . KNEE PAIN, BILATERAL   . OSTEOPENIA   . LEG EDEMA   . Headache   . RHINITIS, ALLERGIC   . GRAVES' DISEASE   . CONVULSIONS, SEIZURES, NOS   . HYPERLIPIDEMIA   . GASTROESOPHAGEAL REFLUX, NO ESOPHAGITIS   . DEPRESSION   . OSTEOARTHRITIS, LOWER LEG     R TKR 07/2010  . Fibromyalgia     Review of Systems  Constitutional: Positive for fatigue. Negative for fever and unexpected weight change.  Respiratory: Negative for chest tightness and wheezing.   Cardiovascular: Negative for palpitations and leg swelling.       Objective:   Physical Exam BP 120/70  Pulse 72  Temp(Src) 97.7 F (36.5 C) (Oral)  Ht 5\' 2"  (1.575 m)  Wt 198 lb (89.812 kg)  BMI 36.21 kg/m2  SpO2 97% Constitutional: She appears well-developed and well-nourished. No distress.   Cardiovascular: Normal rate, regular rhythm and normal heart sounds.  No murmur heard. No BLE edema. Pulmonary/Chest: Effort normal and breath sounds normal. No respiratory distress. She has no wheezes.  Psychiatric: She has a normal mood and affect. Her behavior is normal. Judgment and thought content normal.   Lab Results  Component Value Date   WBC 9.2 07/06/2010   HGB 9.8* 07/06/2010   HCT 31.0* 07/06/2010   PLT 200 07/06/2010   GLUCOSE 149* 07/04/2010   CHOL 239* 08/07/2010   TRIG 102.0 08/07/2010   HDL 43.20 08/07/2010   LDLDIRECT 172.2 08/07/2010   ALT 16 09/01/2009   AST 19 09/01/2009   NA 137 07/04/2010   K 3.8 07/04/2010   CL 100 07/04/2010   CREATININE 0.91 07/04/2010   BUN 8 07/04/2010   CO2 30 07/04/2010   TSH 1.186 03/10/2008   INR 2.01* 07/06/2010        Assessment & Plan:  Dry cough -  ?allergic - check CXR Treat with H2B and mucinex, hold antibiotics unless abn CXR or develops sputum/fever

## 2011-08-08 ENCOUNTER — Telehealth: Payer: Self-pay | Admitting: *Deleted

## 2011-08-08 DIAGNOSIS — M949 Disorder of cartilage, unspecified: Secondary | ICD-10-CM

## 2011-08-08 DIAGNOSIS — R05 Cough: Secondary | ICD-10-CM

## 2011-08-08 DIAGNOSIS — R0600 Dyspnea, unspecified: Secondary | ICD-10-CM

## 2011-08-08 MED ORDER — ALBUTEROL SULFATE HFA 108 (90 BASE) MCG/ACT IN AERS: 2.0000 | INHALATION_SPRAY | Freq: Four times a day (QID) | RESPIRATORY_TRACT | Status: DC | PRN

## 2011-08-08 NOTE — Telephone Encounter (Signed)
CXR 08/01/11 clear - will refer to pulm, in meanwhile try Alb MDI prn  - erx done  Will refer for DEXA

## 2011-08-08 NOTE — Telephone Encounter (Signed)
Pt states she is still not feelong better, ongoing SOB taking meds that was rx. Pt states md told her she was refer her somewhere if not better, also wan to have bone density done... 08/08/11@1 :30am/LMB

## 2011-08-08 NOTE — Telephone Encounter (Signed)
Notified pt with md response... 08/08/11@4 :56pm/LMB

## 2011-08-13 ENCOUNTER — Institutional Professional Consult (permissible substitution): Payer: 59 | Admitting: Pulmonary Disease

## 2011-08-14 ENCOUNTER — Ambulatory Visit (INDEPENDENT_AMBULATORY_CARE_PROVIDER_SITE_OTHER)
Admission: RE | Admit: 2011-08-14 | Discharge: 2011-08-14 | Disposition: A | Discharge status: Home / Self Care | Payer: 59 | Source: Ambulatory Visit

## 2011-08-14 DIAGNOSIS — M899 Disorder of bone, unspecified: Secondary | ICD-10-CM

## 2011-08-14 DIAGNOSIS — M949 Disorder of cartilage, unspecified: Secondary | ICD-10-CM

## 2011-09-03 ENCOUNTER — Encounter: Payer: Self-pay | Admitting: Pulmonary Disease

## 2011-09-03 ENCOUNTER — Ambulatory Visit (INDEPENDENT_AMBULATORY_CARE_PROVIDER_SITE_OTHER): Payer: Medicaid Other | Admitting: Pulmonary Disease

## 2011-09-03 VITALS — BP 114/70 | HR 93 | Temp 97.8°F | Ht 63.0 in | Wt 201.0 lb

## 2011-09-03 DIAGNOSIS — R0989 Other specified symptoms and signs involving the circulatory and respiratory systems: Secondary | ICD-10-CM

## 2011-09-03 DIAGNOSIS — R0600 Dyspnea, unspecified: Secondary | ICD-10-CM

## 2011-09-03 DIAGNOSIS — G40909 Epilepsy, unspecified, not intractable, without status epilepticus: Secondary | ICD-10-CM

## 2011-09-03 DIAGNOSIS — J45991 Cough variant asthma: Secondary | ICD-10-CM | POA: Insufficient documentation

## 2011-09-03 DIAGNOSIS — R05 Cough: Secondary | ICD-10-CM

## 2011-09-03 NOTE — Progress Notes (Signed)
  Subjective:    Patient ID: [name], female    DOB: Sep 30, 1940, 71 y.o.   MRN: [id number]  HPI The patient is a 71 year old female who I been asked to see for persistent pulmonary issues.  She states for the last 2 months, that she has had "spells" of shortness of breath that come on spontaneously.  These occur just about every day, and often 2 times a day.  She feels that her throat is "closing in", and gets very anxious.  She does use albuterol as needed, and feels that it helps less than half the time.  She has no history of asthma or history to suggest chronic thromboembolic disease.  The patient also has cough that occurs on a daily basis, and primarily at night.  It produces a very small quantity of white to gray mucus.  The patient also has significant postnasal drip, and feels that mucus is in her throat.  She also has fairly frequent reflux symptoms.  She has had a chest x-ray last month that was unremarkable, but has not had pulmonary function studies.  She has no history of smoking in the past.   Review of Systems  Constitutional: Negative.  Negative for fever and unexpected weight change.  HENT: Positive for sneezing and dental problem. Negative for ear pain, nosebleeds, congestion, sore throat, rhinorrhea, trouble swallowing, postnasal drip and sinus pressure.   Eyes: Negative.  Negative for redness and itching.  Respiratory: Positive for cough and shortness of breath. Negative for chest tightness and wheezing.   Cardiovascular: Negative.  Negative for palpitations and leg swelling.  Gastrointestinal: Negative for nausea and vomiting.       Indigestion   Genitourinary: Negative.  Negative for dysuria.  Musculoskeletal: Negative.  Negative for joint swelling.  Skin: Negative.  Negative for rash.  Neurological: Negative.  Negative for headaches.  Hematological: Negative.  Does not bruise/bleed easily.  Psychiatric/Behavioral: Negative.  Negative for dysphoric mood. The  patient is not nervous/anxious.        Objective:   Physical Exam Constitutional:  Obese female, no acute distress  HENT:  Nares patent without discharge, but swollen turbinates.  Oropharynx without exudate, palate and uvula are normal.  +mucus in back of throat.   Eyes:  Perrla, eomi, no scleral icterus  Neck:  No JVD, no TMG  Cardiovascular:  Normal rate, regular rhythm, no rubs or gallops.  No murmurs        Intact distal pulses  Pulmonary :  Decreased BS due to poor inspiratory effort, no stridor or respiratory distress   No rales, rhonchi, or wheezing  Abdominal:  Soft, nondistended, bowel sounds present.  No tenderness noted.   Musculoskeletal:  2+ lower extremity edema noted.  Lymph Nodes:  No cervical lymphadenopathy noted  Skin:  No cyanosis noted  Neurologic:  Alert, appropriate, moves all 4 extremities without obvious deficit.         Assessment & Plan:

## 2011-09-03 NOTE — Patient Instructions (Addendum)
Take dexilant 60mg  (samples given) one each am, and take one pepcid at bedtime each night. Stop allegra, and take chlorpheniramine 8mg  at bedtime, and 4mg  at lunch each day. Try to avoid throat clearing, and limit voice use (no singing or yelling). Can use hard candy during the day to bathe the back of your throat and limit tickle.  followup with me in 2 weeks to see if things are better.

## 2011-09-03 NOTE — Assessment & Plan Note (Signed)
The patient's cough sounds more upper airway in origin than lower, and she does give a history for reflux disease and significant postnasal drip.  I would like to treat these entities more aggressively, and see how she responds.  I have also reviewed the behavioral therapies that can help with cyclical coughing.

## 2011-09-03 NOTE — Assessment & Plan Note (Signed)
The patient is having episodic dyspnea on exertion that can come on spontaneously multiple occasions during the day.  From her description, it sounds much more upper airway in origin, and could be secondary to vocal cord dysfunction or panic.  Her lungs are clear today, her recent chest x-ray was normal, and her spirometry today is essentially normal with no evidence for airflow obstruction.  At this point, I would like to treat her aggressively for upper airway dysfunction, and see how she responds.

## 2011-09-13 ENCOUNTER — Other Ambulatory Visit: Payer: Self-pay | Admitting: Internal Medicine

## 2011-09-13 ENCOUNTER — Telehealth: Payer: Self-pay | Admitting: Internal Medicine

## 2011-09-13 NOTE — Telephone Encounter (Signed)
Caller: [name]/Patient; PCP: Rene Paci; CB#: [phone];  Call regarding Difficulty Breathing; Onset: 4/13; worse since 09/12/11.  Afebrile.  Not using inhaler since it ran out after 1 wk; was unaware RX was called to pharmacy 08/08/11. Taking unknown pills from pulmonologist. Intermittent cough with wheezing. Feels like breathing is cut off.  Ambulatory, but audibly short of breath and mildly anxious. Not in Air conditioning.  Advised to see MD now for sudden onset of shortness of breath and difficulty breathing per Breathing Problems Guideline.  No appts remain for 09/13/11;  Info noted and sent to [facility] pool for call back to pt.

## 2011-09-13 NOTE — Telephone Encounter (Signed)
Called pt to make f/u appt she states have appt to see pulm next week will see them. Also inform pt inhaler was sent back to pharmacy this am... 09/13/11@3 ;37pm/LMB

## 2011-09-13 NOTE — Telephone Encounter (Signed)
Noted - please help pt arrange follow up tomorrow, PC or pulm - thanks

## 2011-09-17 ENCOUNTER — Encounter: Payer: Self-pay | Admitting: Pulmonary Disease

## 2011-09-17 ENCOUNTER — Ambulatory Visit (INDEPENDENT_AMBULATORY_CARE_PROVIDER_SITE_OTHER): Payer: Medicare Other | Admitting: Pulmonary Disease

## 2011-09-17 VITALS — BP 100/70 | HR 100 | Temp 97.3°F | Ht 63.0 in | Wt 196.5 lb

## 2011-09-17 DIAGNOSIS — R0600 Dyspnea, unspecified: Secondary | ICD-10-CM

## 2011-09-17 DIAGNOSIS — R05 Cough: Secondary | ICD-10-CM

## 2011-09-17 DIAGNOSIS — R0609 Other forms of dyspnea: Secondary | ICD-10-CM

## 2011-09-17 MED ORDER — OMEPRAZOLE 40 MG PO CPDR: 40.0000 mg | DELAYED_RELEASE_CAPSULE | Freq: Two times a day (BID) | ORAL | Status: DC

## 2011-09-17 NOTE — Assessment & Plan Note (Signed)
The patient feels that her shortness of breath has improved as well from the last visit, and will therefore not add any additional therapy from a pulmonary standpoint.  Her lungs are clear to auscultation, her chest x-ray is clear, and her spirometry was normal at the last visit.

## 2011-09-17 NOTE — Patient Instructions (Addendum)
Will call in a prescription for omeprazole 40mg  one in am and pm for acid reflux.  Can stop pepcid at bedtime. Stay on chlorpheniramine 4mg   2 at bedtime, and take one at lunch if having allergy symptoms. Continue to limit your voice for another few weeks, and use hard candy to help with tickle in throat as we discussed. Please call me in 4 weeks with how things are going, and we can talk about decreasing your meds further.

## 2011-09-17 NOTE — Addendum Note (Signed)
Addended by: Michel Bickers A on: 09/17/2011 03:00 PM   Modules accepted: Orders

## 2011-09-17 NOTE — Assessment & Plan Note (Signed)
The patient's cough is 50% improved from the last visit with treatment of upper airway causes of cough.  I would like to continue with aggressive treatment of reflux disease and postnasal drip to see if she continues to respond favorably.

## 2011-09-17 NOTE — Progress Notes (Signed)
  Subjective:    Patient ID: [name], female    DOB: [birth year], 71 y.o.   MRN: [id number]  HPI Patient comes in today for followup of her cough and dyspnea.  At the last visit, this was felt to be more upper airway in origin than lower, and probably secondary to postnasal drip, reflux disease, and a cyclical cough mechanism.  The patient was treated with proton pump inhibitor and also an antihistamine for postnasal drip, and also instructed on behavioral therapies for cyclical coughing.  She comes in today where she feels that her cough and breathing are 50% improved from the last visit.  I have told her this is significant improvement.   Review of Systems  Constitutional: Negative.  Negative for fever and unexpected weight change.  HENT: Positive for congestion, sneezing and postnasal drip. Negative for ear pain, nosebleeds, sore throat, rhinorrhea, trouble swallowing, dental problem and sinus pressure.   Eyes: Negative.  Negative for redness and itching.  Respiratory: Positive for cough and shortness of breath. Negative for chest tightness and wheezing.   Cardiovascular: Negative.  Negative for palpitations and leg swelling.  Gastrointestinal: Negative.  Negative for nausea and vomiting.  Genitourinary: Negative.  Negative for dysuria.  Musculoskeletal: Negative.  Negative for joint swelling.  Skin: Negative.  Negative for rash.  Neurological: Negative.  Negative for headaches.  Hematological: Does not bruise/bleed easily.  Psychiatric/Behavioral: Negative.  Negative for dysphoric mood. The patient is not nervous/anxious.        Objective:   Physical Exam Overweight female in no acute distress Nose without purulence or discharge noted Chest totally clear to auscultation Cardiac exam is regular rate and rhythm Lower extremities with edema noted, no cyanosis Alert and oriented, moves all 4 extremities.       Assessment & Plan:

## 2011-10-26 ENCOUNTER — Other Ambulatory Visit (INDEPENDENT_AMBULATORY_CARE_PROVIDER_SITE_OTHER): Payer: Medicare Other

## 2011-10-26 ENCOUNTER — Encounter: Payer: Self-pay | Admitting: Internal Medicine

## 2011-10-26 ENCOUNTER — Ambulatory Visit (INDEPENDENT_AMBULATORY_CARE_PROVIDER_SITE_OTHER): Payer: Medicare Other | Admitting: Internal Medicine

## 2011-10-26 VITALS — BP 124/82 | HR 86 | Temp 97.5°F | Ht 63.0 in | Wt 201.0 lb

## 2011-10-26 DIAGNOSIS — R259 Unspecified abnormal involuntary movements: Secondary | ICD-10-CM

## 2011-10-26 DIAGNOSIS — R251 Tremor, unspecified: Secondary | ICD-10-CM

## 2011-10-26 DIAGNOSIS — Z124 Encounter for screening for malignant neoplasm of cervix: Secondary | ICD-10-CM

## 2011-10-26 DIAGNOSIS — E0500 Thyrotoxicosis with diffuse goiter without thyrotoxic crisis or storm: Secondary | ICD-10-CM

## 2011-10-26 DIAGNOSIS — R569 Unspecified convulsions: Secondary | ICD-10-CM

## 2011-10-26 DIAGNOSIS — D649 Anemia, unspecified: Secondary | ICD-10-CM

## 2011-10-26 LAB — CBC WITH DIFFERENTIAL/PLATELET
Basophils Absolute: 0 10*3/uL (ref 0.0–0.1)
Basophils Relative: 0.5 % (ref 0.0–3.0)
HCT: 41.1 % (ref 36.0–46.0)
Hemoglobin: 13.7 g/dL (ref 12.0–15.0)
Lymphs Abs: 3.1 10*3/uL (ref 0.7–4.0)
Monocytes Relative: 13 % — ABNORMAL HIGH (ref 3.0–12.0)
Neutro Abs: 3.9 10*3/uL (ref 1.4–7.7)
RBC: 4.51 Mil/uL (ref 3.87–5.11)
RDW: 14.4 % (ref 11.5–14.6)

## 2011-10-26 LAB — HEPATIC FUNCTION PANEL
ALT: 21 U/L (ref 0–35)
Albumin: 3.3 g/dL — ABNORMAL LOW (ref 3.5–5.2)
Bilirubin, Direct: 0.1 mg/dL (ref 0.0–0.3)
Total Protein: 7 g/dL (ref 6.0–8.3)

## 2011-10-26 LAB — BASIC METABOLIC PANEL
BUN: 26 mg/dL — ABNORMAL HIGH (ref 6–23)
CO2: 28 mEq/L (ref 19–32)
Calcium: 9 mg/dL (ref 8.4–10.5)
Creatinine, Ser: 1.2 mg/dL (ref 0.4–1.2)
Glucose, Bld: 100 mg/dL — ABNORMAL HIGH (ref 70–99)

## 2011-10-26 LAB — FERRITIN: Ferritin: 106 ng/mL (ref 10.0–291.0)

## 2011-10-26 LAB — TSH: TSH: 1.64 u[IU]/mL (ref 0.35–5.50)

## 2011-10-26 NOTE — Assessment & Plan Note (Signed)
Chronic divalproex - no breakthru sz since 2005 -  Decreased to qd dose (500mg .d because afraid 1000mg /d "too much") Follows with neuro for same q6-12 mo Encouraged to resume BID dose as rx'd
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
## 2011-10-26 NOTE — Patient Instructions (Addendum)
It was good to see you today. Resume depakote for your epilepsy 2x/day (total 1000 mg/day is ok) Test(s) ordered today. Your results will be called to you after review (48-72hours after test completion). If any changes need to be made, you will be notified at that time. Other Medications reviewed, no changes at this time. Continue working with your specialists - Drs Debarah Crape, Shelle Iron, Merri Ray as ongoing Please schedule followup in 3-4 months, call sooner if problems. we'll make referral to Dr Cherly Hensen. Our office will contact you regarding appointment(s) once made.

## 2011-10-26 NOTE — Assessment & Plan Note (Signed)
Dx 2011 Follows with endo - never on meds, no hx ablation ?symptomatic with new tremors - check TSH now 

## 2011-10-30 ENCOUNTER — Telehealth: Payer: Self-pay | Admitting: Internal Medicine

## 2011-10-30 NOTE — Telephone Encounter (Signed)
Pt was notified of results... 10/30/11@12 :29pm/LMB

## 2011-10-30 NOTE — Telephone Encounter (Signed)
Please call pt with lab results

## 2011-11-05 ENCOUNTER — Other Ambulatory Visit: Payer: Self-pay | Admitting: Internal Medicine

## 2011-11-06 ENCOUNTER — Telehealth: Payer: Self-pay | Admitting: Pulmonary Disease

## 2011-11-06 NOTE — Telephone Encounter (Signed)
Called # provided above - spoke with pt's son who states he did not call.  Was asked to call pt directly at [phone].  I called this # - NA and unable to leave msg.  WCB

## 2011-11-07 NOTE — Telephone Encounter (Signed)
Called, spoke with pt at [phone] who states her phone was being worked on yesterday.  Pt is requesting to see Dr. Shelle Iron this week because she states her "breathing is not worse but I get to the point I can't breath sometimes."  States this can be at rest or with exertion.  States there is no rhyme or reason for this as yesterday breathing was "terrible but the weather wasn't bad" and today it is better.  Pt states she does use the proair with relief "sometimes."  She also reports she does have wheezing, chest tightness, chest congestion, coughing "all the time," and felt feverish yesterday.  Cough is prod at times with clear mucus.  States at times it has red streaks mixed in.  Pt states she does not want to see TP or any other provider.  She is requesting OV with KC this week.  KC, pls advise as there are no openings with you this week.  Thank you.

## 2011-11-07 NOTE — Telephone Encounter (Signed)
Per Lawson Fiscal, we can use the 4:30 RN slot on Friday, Aug 9.  Called, spoke with pt.  I informed her KC can see her Fri, Aug 9 at 4:30 pm.  She is ok with this appt date and time.  Appt scheduled -- pt aware and was advised to call back or seek emergency care if needed prior to this appt.  She verbalized understanding and voiced no further questions/concerns at this time.

## 2011-11-09 ENCOUNTER — Encounter: Payer: Self-pay | Admitting: Pulmonary Disease

## 2011-11-09 ENCOUNTER — Ambulatory Visit (INDEPENDENT_AMBULATORY_CARE_PROVIDER_SITE_OTHER): Payer: Medicare Other | Admitting: Pulmonary Disease

## 2011-11-09 ENCOUNTER — Other Ambulatory Visit: Payer: Self-pay | Admitting: Pulmonary Disease

## 2011-11-09 VITALS — BP 124/80 | HR 99 | Temp 98.3°F | Ht 63.0 in | Wt 199.4 lb

## 2011-11-09 DIAGNOSIS — R0600 Dyspnea, unspecified: Secondary | ICD-10-CM

## 2011-11-09 DIAGNOSIS — R0609 Other forms of dyspnea: Secondary | ICD-10-CM

## 2011-11-09 NOTE — Assessment & Plan Note (Signed)
The patient continues to have cough and dyspnea on exertion despite treating for entities that often lead to upper airway instability.  She continues to have day-to-day variability, and therefore raises the question of possible asthma.  She has a history of normal spirometry, but I explained to her that this does not rule out asthma.  Given her persistent symptoms, I think she needs to have a methacholine challenge test to put the issue to rest.  If this is normal, I would consider a cardiac workup to evaluate further her dyspnea.

## 2011-11-09 NOTE — Patient Instructions (Addendum)
Will schedule for a special breathing test to see if you have asthma or not.  Will call you with the results.  Can use albuterol as needed.

## 2011-11-09 NOTE — Progress Notes (Signed)
  Subjective:    Patient ID: [name], female    DOB: 06-22-1940, 71 y.o.   MRN: [id number]  HPI The patient comes in today for followup of her dyspnea.  She continues to have issues with intermittent shortness of breath, and describes a day to day variability.  At the last visit, she had a clear chest x-ray, clear exam, and normal spirometry.  She was felt to have primarily upper airway issues, and was treated for postnasal drip and reflux to try and improve her upper airway stability.  She comes in today where she has had more difficulty, and has been using her rescue inhaler fairly frequently with some relief.  She continues to have a dry cough but no chest congestion.   Review of Systems  Constitutional: Positive for fever. Negative for unexpected weight change.       Pt states she "feels feverish"  HENT: Positive for congestion, rhinorrhea, sneezing, trouble swallowing, postnasal drip and sinus pressure. Negative for ear pain, nosebleeds, sore throat and dental problem.   Eyes: Negative.  Negative for redness and itching.  Respiratory: Positive for cough, chest tightness, shortness of breath and wheezing.   Cardiovascular: Negative.  Negative for palpitations and leg swelling.  Gastrointestinal: Negative.  Negative for nausea and vomiting.  Genitourinary: Negative.  Negative for dysuria.  Musculoskeletal: Negative.  Negative for joint swelling.  Skin: Negative.  Negative for rash.  Neurological: Negative for headaches.  Hematological: Bruises/bleeds easily.  Psychiatric/Behavioral: Positive for dysphoric mood. The patient is nervous/anxious.        Objective:   Physical Exam Ow female in nad Nose without purulence or discharge Chest with clear bs, no wheezing Cor with rrr LE with 1+ edema, no cyanosis Alert and oriented, moves all 4.        Assessment & Plan:

## 2011-11-20 ENCOUNTER — Encounter (HOSPITAL_COMMUNITY): Payer: Medicare Other

## 2011-11-23 ENCOUNTER — Ambulatory Visit (HOSPITAL_COMMUNITY)
Admission: RE | Admit: 2011-11-23 | Discharge: 2011-11-23 | Disposition: A | Discharge status: Home / Self Care | Payer: Medicare Other | Source: Ambulatory Visit | Attending: Pulmonary Disease | Admitting: Pulmonary Disease

## 2011-11-23 DIAGNOSIS — R0989 Other specified symptoms and signs involving the circulatory and respiratory systems: Secondary | ICD-10-CM | POA: Insufficient documentation

## 2011-11-23 DIAGNOSIS — R0600 Dyspnea, unspecified: Secondary | ICD-10-CM

## 2011-11-23 DIAGNOSIS — R0609 Other forms of dyspnea: Secondary | ICD-10-CM | POA: Insufficient documentation

## 2011-11-23 MED ORDER — METHACHOLINE 0.0625 MG/ML NEB SOLN
2.0000 mL | Freq: Once | RESPIRATORY_TRACT | Status: AC
  Administered 2011-11-23: 0.125 mg via RESPIRATORY_TRACT

## 2011-11-23 MED ORDER — METHACHOLINE 16 MG/ML NEB SOLN
2.0000 mL | Freq: Once | RESPIRATORY_TRACT | Status: AC
  Administered 2011-11-23: 32 mg via RESPIRATORY_TRACT

## 2011-11-23 MED ORDER — METHACHOLINE 4 MG/ML NEB SOLN
2.0000 mL | Freq: Once | RESPIRATORY_TRACT | Status: AC
  Administered 2011-11-23: 8 mg via RESPIRATORY_TRACT

## 2011-11-23 MED ORDER — METHACHOLINE 0.25 MG/ML NEB SOLN
2.0000 mL | Freq: Once | RESPIRATORY_TRACT | Status: AC
  Administered 2011-11-23: 0.5 mg via RESPIRATORY_TRACT

## 2011-11-23 MED ORDER — METHACHOLINE 1 MG/ML NEB SOLN
2.0000 mL | Freq: Once | RESPIRATORY_TRACT | Status: AC
  Administered 2011-11-23: 2 mg via RESPIRATORY_TRACT

## 2011-11-23 MED ORDER — ALBUTEROL SULFATE (5 MG/ML) 0.5% IN NEBU
2.5000 mg | INHALATION_SOLUTION | Freq: Once | RESPIRATORY_TRACT | Status: AC
  Administered 2011-11-23: 2.5 mg via RESPIRATORY_TRACT

## 2011-11-23 MED ORDER — SODIUM CHLORIDE 0.9 % IN NEBU
3.0000 mL | INHALATION_SOLUTION | Freq: Once | RESPIRATORY_TRACT | Status: AC
  Administered 2011-11-23: 3 mL via RESPIRATORY_TRACT

## 2011-11-27 ENCOUNTER — Telehealth: Payer: Self-pay | Admitting: Pulmonary Disease

## 2011-11-27 NOTE — Telephone Encounter (Signed)
Pt needs ov to review the results of her methacholine challenge.

## 2011-11-30 ENCOUNTER — Encounter: Payer: Self-pay | Admitting: Pulmonary Disease

## 2011-11-30 ENCOUNTER — Ambulatory Visit (INDEPENDENT_AMBULATORY_CARE_PROVIDER_SITE_OTHER): Payer: Medicare Other | Admitting: Pulmonary Disease

## 2011-11-30 VITALS — BP 122/82 | HR 84 | Temp 98.4°F | Ht 63.0 in | Wt 199.4 lb

## 2011-11-30 DIAGNOSIS — R0989 Other specified symptoms and signs involving the circulatory and respiratory systems: Secondary | ICD-10-CM

## 2011-11-30 DIAGNOSIS — R05 Cough: Secondary | ICD-10-CM

## 2011-11-30 DIAGNOSIS — R0600 Dyspnea, unspecified: Secondary | ICD-10-CM

## 2011-11-30 NOTE — Assessment & Plan Note (Signed)
The patient has a positive methacholine challenge test, and this suggests that she may have cough variant asthma.  However, I have also explained to her that patients with a hyper sensitized upper airway/reactive airways can also have a positive study.  I would like to treat her aggressively for asthma to see if her breathing improves.

## 2011-11-30 NOTE — Progress Notes (Signed)
  Subjective:    Patient ID: [name], female    DOB: 09-21-1940, 71 y.o.   MRN: [id number]  HPI Patient comes in today for followup of her methacholine challenge test.  This was done for workup of her ongoing cough and dyspnea on exertion despite having normal spirometry.  She was found to have a positive study in the range of 8.0-9.0 concentration of methacholine.  She had a significant bronchodilator response afterwards.  I have reviewed the study with her in detail, and answered all of her questions.   Review of Systems  Constitutional: Negative for fever and unexpected weight change.  HENT: Positive for congestion and sinus pressure. Negative for ear pain, nosebleeds, sore throat, rhinorrhea, sneezing, trouble swallowing, dental problem and postnasal drip.   Eyes: Negative for redness and itching.  Respiratory: Positive for cough and shortness of breath. Negative for chest tightness and wheezing.   Cardiovascular: Negative for palpitations and leg swelling.  Gastrointestinal: Negative for nausea and vomiting.  Genitourinary: Negative for dysuria.  Musculoskeletal: Negative for joint swelling.  Skin: Negative for rash.  Neurological: Negative for headaches.  Hematological: Does not bruise/bleed easily.  Psychiatric/Behavioral: Negative for dysphoric mood. The patient is not nervous/anxious.        Objective:   Physical Exam Overweight female in no acute distress Nose without purulence or discharge noted Chest clear to auscultation Cardiac exam with regular rate and rhythm Lower extremities with mild edema, no cyanosis Alert and oriented, moves all 4 extremities.       Assessment & Plan:

## 2011-11-30 NOTE — Patient Instructions (Addendum)
I would like to treat you for "cough variant asthma" to see if you improve.  Take dulera 100/5  2 inhalations each am and pm everyday no matter how you feel.  Rinse mouth out very well afterwards. Can use albuterol as needed for emergencies only Continue on your acid reflux med and your chlorpheniramine for allergies for now.  followup with me in 4 weeks.

## 2011-11-30 NOTE — Assessment & Plan Note (Signed)
I continue to believe that some of her cough is from the upper airway, but would like to see if this significantly improves with treatment for asthma given her methacholine challenge testing.

## 2011-12-06 ENCOUNTER — Other Ambulatory Visit: Payer: Self-pay | Admitting: Pulmonary Disease

## 2011-12-24 ENCOUNTER — Ambulatory Visit (INDEPENDENT_AMBULATORY_CARE_PROVIDER_SITE_OTHER): Payer: Medicare Other | Admitting: Internal Medicine

## 2011-12-24 ENCOUNTER — Telehealth: Payer: Self-pay | Admitting: Internal Medicine

## 2011-12-24 ENCOUNTER — Encounter: Payer: Self-pay | Admitting: Internal Medicine

## 2011-12-24 VITALS — BP 120/80 | HR 95 | Temp 98.4°F | Resp 16 | Wt 198.5 lb

## 2011-12-24 DIAGNOSIS — F329 Major depressive disorder, single episode, unspecified: Secondary | ICD-10-CM

## 2011-12-24 DIAGNOSIS — M797 Fibromyalgia: Secondary | ICD-10-CM | POA: Insufficient documentation

## 2011-12-24 DIAGNOSIS — IMO0001 Reserved for inherently not codable concepts without codable children: Secondary | ICD-10-CM

## 2011-12-24 DIAGNOSIS — M25569 Pain in unspecified knee: Secondary | ICD-10-CM

## 2011-12-24 MED ORDER — CYCLOBENZAPRINE HCL 5 MG PO TABS: 5.0000 mg | ORAL_TABLET | Freq: Two times a day (BID) | ORAL | Status: DC | PRN

## 2011-12-24 MED ORDER — NORTRIPTYLINE HCL 10 MG PO CAPS: 10.0000 mg | ORAL_CAPSULE | Freq: Every day | ORAL | Status: DC

## 2011-12-24 NOTE — Telephone Encounter (Signed)
Caller: Lucina/Patient; Patient Name: [name]; PCP: Rene Paci (Adults only); Best Callback Phone Number: [phone]. Call regarding general muscle aches. Onset approximately 2-3 weeks. Patient reports that she is having trouble with ambulation. Reports she can walk, but is having pain in her ankles and knees. Reports pain in her upper back which radiates down her back into her legs. Emergent symptom of "Back pain radiates into thigh or below knee" positive per Back Symptoms guideline. Disposition: See provider within 24 hours. Appointment scheduled with Dr. Felicity Coyer 12/24/11 at 4:00pm. Care advice and call back parameters given per guideline. Patient verbalized understanding.

## 2011-12-24 NOTE — Assessment & Plan Note (Signed)
status post TKR 07/06/2010 - dr. Turner Daniels recommended tylenol or ibuprofen as needed

## 2011-12-24 NOTE — Assessment & Plan Note (Signed)
exac by family stressors (ill mom and brother) - inconsistent compliance with prior SSRI and SNRI  Support offered as pt moved out of family home early 2013 where she had been caregiver to ill members and in indep senior apts

## 2011-12-24 NOTE — Patient Instructions (Signed)
It was good to see you today. Start nortriptyline at bedtime for fibromyalgia pain and use flexeril as needed for muscle spasm -Your prescription(s) have been submitted to your pharmacy. Please take as directed and contact our office if you believe you are having problem(s) with the medication(s). Other Medications reviewed, no changes at this time. Continue working with your specialists - Drs Shelle Iron, Bufford Buttner as ongoing/as scheduled

## 2011-12-24 NOTE — Assessment & Plan Note (Signed)
Start nortriptyline qhs and use muscle relaxer as needed No joint swelling, chronic symptoms
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
## 2011-12-26 ENCOUNTER — Telehealth: Payer: Self-pay | Admitting: General Practice

## 2011-12-26 NOTE — Telephone Encounter (Signed)
PA started on 9-24, received fax on 9/25, and placed in your basket.

## 2011-12-27 NOTE — Telephone Encounter (Signed)
PA approved 12/26/11. Good through 12/25/2012, PA faxed to pharmacy.

## 2012-01-01 ENCOUNTER — Ambulatory Visit: Payer: Medicare Other | Admitting: Pulmonary Disease

## 2012-01-18 ENCOUNTER — Ambulatory Visit (INDEPENDENT_AMBULATORY_CARE_PROVIDER_SITE_OTHER): Payer: Medicaid Other | Admitting: Pulmonary Disease

## 2012-01-18 ENCOUNTER — Encounter: Payer: Self-pay | Admitting: Pulmonary Disease

## 2012-01-18 VITALS — BP 106/62 | HR 112 | Temp 98.0°F | Ht 63.0 in | Wt 199.6 lb

## 2012-01-18 DIAGNOSIS — J45991 Cough variant asthma: Secondary | ICD-10-CM

## 2012-01-18 DIAGNOSIS — R05 Cough: Secondary | ICD-10-CM

## 2012-01-18 NOTE — Patient Instructions (Addendum)
Stay on dulera twice a day everyday.  Rinse mouth well. Stop pepcid, but stay on omeprazole ONCE a day for now Can change your allergy medication to as needed. followup with me in 6mos.

## 2012-01-18 NOTE — Assessment & Plan Note (Signed)
The patient has cough variant asthma, and has responded well to dulera.  Her cough has significantly improved on the inhaler, but she does have some residual at night.  It may take a little longer for her airway inflammation to totally resolve, or she may also have a component of upper airway cough from reflux.  I have asked her to stay on dulera, as well as her proton pump inhibitor.  I suspect she will get better over time.

## 2012-01-18 NOTE — Progress Notes (Signed)
  Subjective:    Patient ID: [name], female    DOB: March 13, 1941, 71 y.o.   MRN: [id number]  HPI Patient comes in today for followup of her chronic cough.  She had a methacholine challenge test which was positive, and was started on dulera.  She comes in today where her cough is much improved, but she does occasionally have issues at night.  I suspect she has a component of an upper airway cough as well.  She is having no issues with her inhaler in terms of tolerance.   Review of Systems  Constitutional: Negative for fever and unexpected weight change.  HENT: Positive for congestion. Negative for ear pain, nosebleeds, sore throat, rhinorrhea, sneezing, trouble swallowing, dental problem, postnasal drip and sinus pressure.   Eyes: Negative for redness and itching.  Respiratory: Positive for cough, shortness of breath and wheezing. Negative for chest tightness.   Cardiovascular: Positive for leg swelling. Negative for palpitations.  Gastrointestinal: Negative for nausea and vomiting.  Genitourinary: Negative for dysuria.  Musculoskeletal: Negative for joint swelling.  Skin: Negative for rash.  Neurological: Negative for headaches.  Hematological: Does not bruise/bleed easily.  Psychiatric/Behavioral: Negative for dysphoric mood. The patient is not nervous/anxious.        Objective:   Physical Exam Overweight female in no acute distress Nose without purulent discharge noted Chest totally clear to auscultation, no wheezing Cardiac exam with regular rate and rhythm Lower extremities with 1+ edema, no cyanosis Alert and oriented, moves all 4 extremities.       Assessment & Plan:

## 2012-02-15 ENCOUNTER — Ambulatory Visit (INDEPENDENT_AMBULATORY_CARE_PROVIDER_SITE_OTHER): Payer: Medicare Other | Admitting: Internal Medicine

## 2012-02-15 ENCOUNTER — Other Ambulatory Visit (INDEPENDENT_AMBULATORY_CARE_PROVIDER_SITE_OTHER): Payer: Medicare Other

## 2012-02-15 ENCOUNTER — Telehealth: Payer: Self-pay | Admitting: Internal Medicine

## 2012-02-15 ENCOUNTER — Encounter: Payer: Self-pay | Admitting: Internal Medicine

## 2012-02-15 VITALS — BP 128/72 | HR 110 | Temp 98.6°F | Ht 63.0 in | Wt 197.8 lb

## 2012-02-15 DIAGNOSIS — E0500 Thyrotoxicosis with diffuse goiter without thyrotoxic crisis or storm: Secondary | ICD-10-CM

## 2012-02-15 DIAGNOSIS — R259 Unspecified abnormal involuntary movements: Secondary | ICD-10-CM

## 2012-02-15 DIAGNOSIS — M797 Fibromyalgia: Secondary | ICD-10-CM

## 2012-02-15 DIAGNOSIS — R251 Tremor, unspecified: Secondary | ICD-10-CM

## 2012-02-15 DIAGNOSIS — IMO0001 Reserved for inherently not codable concepts without codable children: Secondary | ICD-10-CM

## 2012-02-15 MED ORDER — PROPRANOLOL HCL ER 120 MG PO CP24: 120.0000 mg | ORAL_CAPSULE | Freq: Every day | ORAL | Status: DC
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
## 2012-02-15 NOTE — Assessment & Plan Note (Signed)
Dx 2011 Follows with endo - never on meds, no hx ablation ?symptomatic with new tremors - check TSH now

## 2012-02-15 NOTE — Assessment & Plan Note (Signed)
Started nortriptyline qhs 12/2011 and use muscle relaxer as needed No joint swelling, chronic symptoms 

## 2012-02-15 NOTE — Patient Instructions (Signed)
It was good to see you today. Test(s) ordered today. Your results will be released to MyChart (or called to you) after review, usually within 72hours after test completion. If any changes need to be made, you will be notified at that same time. Start Inderal once daily for tremor and fast heart rate -Other Medications reviewed, no changes at this time. Your prescription(s) have been submitted to your pharmacy. Please take as directed and contact our office if you believe you are having problem(s) with the medication(s).

## 2012-02-15 NOTE — Telephone Encounter (Signed)
Patient Information:  Caller Name: Ailany  Phone: [phone]  Patient: [name], [name]  Gender: Female  DOB: [birth year]  Age: 71 Years  PCP: Rene Paci (Adults only)   Symptoms  Reason For Call & Symptoms: Patient states she just "hurts".  She does have a hx of Fibromyalgia and a tremor.   She states she body pain in joints "a good little while" and just tries to "take it". Described as constant with worsening.   She is asking for some type of pain medication or sleep medication to rest.  Reviewed Health History In EMR: Yes  Reviewed Medications In EMR: Yes  Reviewed Allergies In EMR: Yes  Date of Onset of Symptoms: 02/15/2011  Guideline(s) Used:  Leg Swelling and Edema  Disposition Per Guideline:   See Within 3 Days in Office  Reason For Disposition Reached:   Mild swelling of both ankles (i.e., pedal edema) AND new onset or worsening  Advice Given:  Expected Course:  If your leg swelling does not get better during the next week or if it recurs, make an appointment with your doctor.  Call Back If:  Swelling becomes worse  Swelling becomes red or painful to the touch  Calf pain occurs and becomes constant  You become worse.  Office Follow Up:  Does the office need to follow up with this patient?: No  Instructions For The Office: N/A  RN Note:  She states heat /warm baths help with discomfort. Patient is asking for something for pain and does not want to make an office visit. She states physician is aware of her pain.

## 2012-02-20 ENCOUNTER — Telehealth: Payer: Self-pay | Admitting: Internal Medicine

## 2012-02-20 NOTE — Telephone Encounter (Signed)
Noted and agree thanks

## 2012-02-20 NOTE — Telephone Encounter (Signed)
Called for results of lab test done 02/15/12.  Per Epic, advised TSH and T4 were normal.  Declined triage.  Reports the medications are helping.

## 2012-03-03 ENCOUNTER — Emergency Department (HOSPITAL_COMMUNITY)
Admit: 2012-03-03 | Discharge: 2012-03-03 | Disposition: A | Discharge status: Home / Self Care | Payer: Medicare Other | Attending: Emergency Medicine | Admitting: Emergency Medicine

## 2012-03-03 ENCOUNTER — Encounter (HOSPITAL_COMMUNITY): Payer: Self-pay | Admitting: Emergency Medicine

## 2012-03-03 ENCOUNTER — Emergency Department (INDEPENDENT_AMBULATORY_CARE_PROVIDER_SITE_OTHER)
Admission: EM | Admit: 2012-03-03 | Discharge: 2012-03-03 | Disposition: A | Discharge status: Home / Self Care | Payer: Medicare Other | Source: Home / Self Care | Attending: Emergency Medicine | Admitting: Emergency Medicine

## 2012-03-03 DIAGNOSIS — M545 Low back pain, unspecified: Secondary | ICD-10-CM | POA: Insufficient documentation

## 2012-03-03 DIAGNOSIS — M549 Dorsalgia, unspecified: Secondary | ICD-10-CM

## 2012-03-03 MED ORDER — TRAMADOL HCL 50 MG PO TABS: 50.0000 mg | ORAL_TABLET | Freq: Four times a day (QID) | ORAL | Status: DC | PRN

## 2012-03-03 MED ORDER — CYCLOBENZAPRINE HCL 5 MG PO TABS: 5.0000 mg | ORAL_TABLET | Freq: Two times a day (BID) | ORAL | Status: DC | PRN
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
## 2012-03-03 NOTE — ED Notes (Signed)
Called Angie in radiology and made her aware that patient was on the way

## 2012-03-04 NOTE — ED Provider Notes (Signed)
Medical screening examination/treatment/procedure(s) were performed by non-physician practitioner and as supervising physician I was immediately available for consultation/collaboration.  Leslee Home, M.D.   Reuben Likes, MD 03/04/12 [phone]

## 2012-04-15 ENCOUNTER — Telehealth: Payer: Self-pay | Admitting: Pulmonary Disease

## 2012-04-15 NOTE — Telephone Encounter (Signed)
I spoke with pt. She wanted to know th process in order to be set up on CPAP. I advised her she had to have a sleep study and it has to show she has OSA. She stated she did have a sleep study but she did not have sleep apnea. Pt needed nothing further

## 2012-04-29 ENCOUNTER — Other Ambulatory Visit: Payer: Self-pay | Admitting: Pulmonary Disease

## 2012-05-07 ENCOUNTER — Other Ambulatory Visit: Payer: Self-pay | Admitting: Internal Medicine

## 2012-06-02 ENCOUNTER — Encounter (HOSPITAL_COMMUNITY): Payer: Self-pay | Admitting: *Deleted

## 2012-06-02 ENCOUNTER — Telehealth: Payer: Self-pay | Admitting: Internal Medicine

## 2012-06-02 ENCOUNTER — Emergency Department (HOSPITAL_COMMUNITY)
Admission: EM | Admit: 2012-06-02 | Discharge: 2012-06-02 | Disposition: A | Discharge status: Home / Self Care | Payer: Medicare Other | Attending: Emergency Medicine | Admitting: Emergency Medicine

## 2012-06-02 DIAGNOSIS — M899 Disorder of bone, unspecified: Secondary | ICD-10-CM | POA: Insufficient documentation

## 2012-06-02 DIAGNOSIS — F519 Sleep disorder not due to a substance or known physiological condition, unspecified: Secondary | ICD-10-CM | POA: Insufficient documentation

## 2012-06-02 DIAGNOSIS — M5430 Sciatica, unspecified side: Secondary | ICD-10-CM | POA: Insufficient documentation

## 2012-06-02 DIAGNOSIS — Z8679 Personal history of other diseases of the circulatory system: Secondary | ICD-10-CM | POA: Insufficient documentation

## 2012-06-02 DIAGNOSIS — IMO0001 Reserved for inherently not codable concepts without codable children: Secondary | ICD-10-CM | POA: Insufficient documentation

## 2012-06-02 DIAGNOSIS — M549 Dorsalgia, unspecified: Secondary | ICD-10-CM | POA: Insufficient documentation

## 2012-06-02 DIAGNOSIS — M1710 Unilateral primary osteoarthritis, unspecified knee: Secondary | ICD-10-CM | POA: Insufficient documentation

## 2012-06-02 DIAGNOSIS — G40909 Epilepsy, unspecified, not intractable, without status epilepticus: Secondary | ICD-10-CM | POA: Insufficient documentation

## 2012-06-02 DIAGNOSIS — K219 Gastro-esophageal reflux disease without esophagitis: Secondary | ICD-10-CM | POA: Insufficient documentation

## 2012-06-02 DIAGNOSIS — E785 Hyperlipidemia, unspecified: Secondary | ICD-10-CM | POA: Insufficient documentation

## 2012-06-02 DIAGNOSIS — F3289 Other specified depressive episodes: Secondary | ICD-10-CM | POA: Insufficient documentation

## 2012-06-02 DIAGNOSIS — Z79899 Other long term (current) drug therapy: Secondary | ICD-10-CM | POA: Insufficient documentation

## 2012-06-02 DIAGNOSIS — E0500 Thyrotoxicosis with diffuse goiter without thyrotoxic crisis or storm: Secondary | ICD-10-CM | POA: Insufficient documentation

## 2012-06-02 MED ORDER — IBUPROFEN 400 MG PO TABS: 400.0000 mg | ORAL_TABLET | Freq: Four times a day (QID) | ORAL | Status: DC | PRN

## 2012-06-02 NOTE — Telephone Encounter (Signed)
Patient Information:  Caller Name: Leone  Phone: [phone]  Patient: Cassidy, [name]  Gender: Female  DOB: [birth year]  Age: 72 Years  PCP: Rene Paci (Adults only)  Office Follow Up:  Does the office need to follow up with this patient?: No  Instructions For The Office: N/A   Symptoms  Reason For Call & Symptoms: SX started 1 week ago.  She hurts from her waist down to her buttocks and down to the left thigh.  She has Fibromyalgia.  Per triage pt send to [facility] for evaluation.  Call back instructions given.   Reviewed Health History In EMR: Yes  Reviewed Medications In EMR: Yes  Reviewed Allergies In EMR: Yes  Reviewed Surgeries / Procedures: No  Date of Onset of Symptoms: 05/28/2012  Treatments Tried: Warm water soaks.  Treatments Tried Worked: No  Guideline(s) Used:  Leg Pain  Disposition Per Guideline:   Go to ED Now (or to Office with PCP Approval)  Reason For Disposition Reached:   Thigh or calf pain in only one leg and present > 1 hour  Advice Given:  Call Back If:  Moderate pain (e.g., limping) lasts more than 3 days  You become worse.

## 2012-06-02 NOTE — ED Notes (Addendum)
C/o left posterior thigh pain that occasionally radiates to left ankle. No known injury. Onset 1 week ago.

## 2012-06-02 NOTE — ED Provider Notes (Signed)
History     CSN: [id number]  Arrival date & time 06/02/12  1049   First MD Initiated Contact with Patient 06/02/12 1201      Chief Complaint  Patient presents with  . Leg Pain    (Consider location/radiation/quality/duration/timing/severity/associated sxs/prior treatment) HPI Comments: This is a 72 year old female, past medical history remarkable for fibromyalgia, and osteoarthritis, who presents emergency department with chief complaint of left hip pain. Patient states that her left hip and leg been bothering her for the past week. She normally takes aspirin for her symptoms. She called her primary care doctor, who told her to switch to ibuprofen. She states the pain is 3/10. She is able to ambulate appropriately. She denies any bowel or bladder incontinence. She has not had any saddle anesthesia.  The history is provided by the patient. No language interpreter was used.    Past Medical History  Diagnosis Date  . INSOMNIA-SLEEP DISORDER-UNSPEC   . KNEE PAIN, BILATERAL   . OSTEOPENIA   . LEG EDEMA   . Headache   . RHINITIS, ALLERGIC   . GRAVES' DISEASE   . CONVULSIONS, SEIZURES, NOS   . HYPERLIPIDEMIA   . GASTROESOPHAGEAL REFLUX, NO ESOPHAGITIS   . DEPRESSION   . OSTEOARTHRITIS, LOWER LEG     R TKR 07/2010  . Fibromyalgia     Past Surgical History  Procedure Laterality Date  . Tubal ligation  04/28/03  . Right knee arthroscopy  2006  . Total knee arthroplasty  07/06/2010    right TKR - [facility]an    Family History  Problem Relation Age of Onset  . Diabetes Mother   . Osteoarthritis Mother   . Hyperlipidemia Mother   . Alzheimer's disease Mother   . Prostate cancer Father   . Osteoarthritis Brother   . Colon polyps Brother   . Prostate cancer Brother   . Alcohol abuse Brother     History  Substance Use Topics  . Smoking status: Never Smoker   . Smokeless tobacco: Never Used     Comment: Lives with mother who has alzheimers (follows with home hospice) and brother  who assist with care.   . Alcohol Use: No    OB History   Grav Para Term Preterm Abortions TAB SAB Ect Mult Living                  Review of Systems  All other systems reviewed and are negative.    Allergies  Review of patient's allergies indicates no known allergies.  Home Medications   Current Outpatient Rx  Name  Route  Sig  Dispense  Refill  . Calcium Carb-Cholecalciferol (CALCIUM 1000 + D PO)   Oral   Take 1 tablet by mouth 3 (three) times daily.         . famotidine (PEPCID) 20 MG tablet      take 1 tablet by mouth twice a day   60 tablet   5   . guaiFENesin (MUCINEX) 600 MG 12 hr tablet   Oral   Take 600 mg by mouth daily.         Marland Kitchen loratadine (CLARITIN) 10 MG tablet   Oral   Take 1 tablet (10 mg total) by mouth daily as needed for allergies.   30 tablet   2   . losartan-hydrochlorothiazide (HYZAAR) 50-12.5 MG per tablet   Oral   Take 1 tablet by mouth daily.         . mometasone-formoterol (DULERA) 100-5 MCG/ACT  AERO   Inhalation   Inhale 2 puffs into the lungs 2 (two) times daily.         . nortriptyline (PAMELOR) 10 MG capsule      take 1 capsule by mouth at bedtime   30 capsule   5   . omeprazole (PRILOSEC) 40 MG capsule      take 1 capsule by mouth twice a day   60 capsule   2   . PROAIR HFA 108 (90 BASE) MCG/ACT inhaler      inhale 2 puffs by mouth every 6 hours AS NEEDED FOR WHEEZING   8.5 g   1   . propranolol ER (INDERAL LA) 120 MG 24 hr capsule   Oral   Take 1 capsule (120 mg total) by mouth daily.   30 capsule   3   . simvastatin (ZOCOR) 20 MG tablet   Oral   Take 20 mg by mouth every evening.         . valACYclovir (VALTREX) 1000 MG tablet   Oral   Take 1,000 mg by mouth daily as needed (for outbreaks).          Marland Kitchen divalproex (DEPAKOTE ER) 500 MG 24 hr tablet   Oral   Take 500 mg by mouth 2 (two) times daily.    30 tablet   6   . ibuprofen (ADVIL,MOTRIN) 400 MG tablet   Oral   Take 1 tablet (400  mg total) by mouth every 6 (six) hours as needed for pain.   30 tablet   0     BP 135/73  Pulse 72  Temp(Src) 98.1 F (36.7 C) (Oral)  Resp 16  SpO2 94%  Physical Exam  Nursing note and vitals reviewed. Constitutional: She is oriented to person, place, and time. She appears well-developed and well-nourished.  HENT:  Head: Normocephalic and atraumatic.  Eyes: Conjunctivae and EOM are normal.  Neck: Normal range of motion.  Cardiovascular: Normal rate.   Pulmonary/Chest: Effort normal.  Abdominal: She exhibits no distension.  Musculoskeletal: Normal range of motion. She exhibits no edema and no tenderness.  Left hip nontender to palpation, full range of motion, some tenderness of the SI joint  Neurological: She is alert and oriented to person, place, and time.  Sensation and strength intact bilaterally  Skin: Skin is dry.  Psychiatric: She has a normal mood and affect. Her behavior is normal. Judgment and thought content normal.    ED Course  Procedures (including critical care time)  Labs Reviewed - No data to display No results found.   1. Back pain   2. Sciatica neuralgia, left       MDM  Suspect the patient's pain and sciatica, she describes that it is starting to her back, and radiates down her buttocks and back of her leg. Patient states that her pain is mild. Will give the patient some ibuprofen, and have her followup with her primary care doctor. No indication for imaging of this time, as there is no bony tenderness, the patient did not have a fall or other mechanism of injury. Patient is stable and ready for discharge. Patient can ambulate appropriately. She is neurologically intact. No signs of lower extremity swelling or edema.        Roxy Horseman, PA-C 06/02/12 1546

## 2012-06-02 NOTE — ED Notes (Signed)
Pt is here for left leg pain which is not associated with any trauma.  Pt has bilateral lower extremity edema (swelling equal in both legs) which is chronic.  Pt has fibromyalgia and thinks that this pain is related.  No increased sob and no CP with this

## 2012-06-05 NOTE — ED Provider Notes (Signed)
Medical screening examination/treatment/procedure(s) were performed by non-physician practitioner and as supervising physician I was immediately available for consultation/collaboration.   Richardean Canal, MD 06/05/12 [phone]

## 2012-06-10 ENCOUNTER — Encounter: Payer: Self-pay | Admitting: Internal Medicine

## 2012-06-10 ENCOUNTER — Ambulatory Visit (INDEPENDENT_AMBULATORY_CARE_PROVIDER_SITE_OTHER): Payer: Medicare Other | Admitting: Internal Medicine

## 2012-06-10 VITALS — BP 102/68 | HR 75 | Temp 97.0°F | Wt 199.8 lb

## 2012-06-10 DIAGNOSIS — IMO0001 Reserved for inherently not codable concepts without codable children: Secondary | ICD-10-CM

## 2012-06-10 DIAGNOSIS — M797 Fibromyalgia: Secondary | ICD-10-CM

## 2012-06-10 MED ORDER — IBUPROFEN 400 MG PO TABS: 400.0000 mg | ORAL_TABLET | Freq: Three times a day (TID) | ORAL | Status: DC

## 2012-06-10 MED ORDER — CYCLOBENZAPRINE HCL 5 MG PO TABS: 5.0000 mg | ORAL_TABLET | Freq: Every day | ORAL | Status: DC

## 2012-06-10 NOTE — Patient Instructions (Signed)
It was good to see you today. We have reviewed your ER records including labs and tests today Continue ibuprofen 400mg  three times daily with food x 1 week, then as needed for pain Use flexeril at bedtime x 1 week for muscle relaxer, then as needed Your prescription(s) have been submitted to your pharmacy. Please take as directed and contact our office if you believe you are having problem(s) with the medication(s). If pain worse or unimproved in next 4-6 weeks, call for revaluation and other testing as needed Back Exercises Back exercises help treat and prevent back injuries. The goal of back exercises is to increase the strength of your abdominal and back muscles and the flexibility of your back. These exercises should be started when you no longer have back pain. Back exercises include:  Pelvic Tilt. Lie on your back with your knees bent. Tilt your pelvis until the lower part of your back is against the floor. Hold this position 5 to 10 sec and repeat 5 to 10 times.  Knee to Chest. Pull first 1 knee up against your chest and hold for 20 to 30 seconds, repeat this with the other knee, and then both knees. This may be done with the other leg straight or bent, whichever feels better.  Sit-Ups or Curl-Ups. Bend your knees 90 degrees. Start with tilting your pelvis, and do a partial, slow sit-up, lifting your trunk only 30 to 45 degrees off the floor. Take at least 2 to 3 seconds for each sit-up. Do not do sit-ups with your knees out straight. If partial sit-ups are difficult, simply do the above but with only tightening your abdominal muscles and holding it as directed.  Hip-Lift. Lie on your back with your knees flexed 90 degrees. Push down with your feet and shoulders as you raise your hips a couple inches off the floor; hold for 10 seconds, repeat 5 to 10 times.  Back arches. Lie on your stomach, propping yourself up on bent elbows. Slowly press on your hands, causing an arch in your low back.  Repeat 3 to 5 times. Any initial stiffness and discomfort should lessen with repetition over time.  Shoulder-Lifts. Lie face down with arms beside your body. Keep hips and torso pressed to floor as you slowly lift your head and shoulders off the floor. Do not overdo your exercises, especially in the beginning. Exercises may cause you some mild back discomfort which lasts for a few minutes; however, if the pain is more severe, or lasts for more than 15 minutes, do not continue exercises until you see your caregiver. Improvement with exercise therapy for back problems is slow.  See your caregivers for assistance with developing a proper back exercise program. Document Released: 04/26/2004 Document Revised: 06/11/2011 Document Reviewed: 01/18/2011 [facility] Patient Information 2013 [place], [place].

## 2012-06-10 NOTE — Progress Notes (Signed)
  Subjective:    Patient ID: [name], female    DOB: 10/10/1940, 72 y.o.   MRN: [id number]  HPI  Here for ER follow up - seen last week for LLE pain - dx sciatica, tx with ibuprofen Since then, pain is improved bu not resolved Hx same 4 years ago -  No trauma or falls, no weakness or numbness  Past Medical History  Diagnosis Date  . INSOMNIA-SLEEP DISORDER-UNSPEC   . KNEE PAIN, BILATERAL   . OSTEOPENIA   . LEG EDEMA   . Headache   . RHINITIS, ALLERGIC   . GRAVES' DISEASE   . CONVULSIONS, SEIZURES, NOS   . HYPERLIPIDEMIA   . GASTROESOPHAGEAL REFLUX, NO ESOPHAGITIS   . DEPRESSION   . OSTEOARTHRITIS, LOWER LEG     R TKR 07/2010  . Fibromyalgia     Review of Systems  Constitutional: Negative for fever, fatigue and unexpected weight change.  Musculoskeletal: Positive for back pain. Negative for myalgias, joint swelling and gait problem.  Skin: Negative for rash and wound.       Objective:   Physical Exam BP 102/68  Pulse 75  Temp(Src) 97 F (36.1 C) (Oral)  Wt 199 lb 12.8 oz (90.629 kg)  BMI 35.4 kg/m2  SpO2 98% Wt Readings from Last 3 Encounters:  06/10/12 199 lb 12.8 oz (90.629 kg)  02/15/12 197 lb 12.8 oz (89.721 kg)  01/18/12 199 lb 9.6 oz (90.538 kg)   Constitutional: She appears well-developed and well-nourished. No distress.  Cardiovascular: Normal rate, regular rhythm and normal heart sounds.  No murmur heard. No BLE edema. Pulmonary/Chest: Effort normal and breath sounds normal. No respiratory distress. She has no wheezes.  Abdominal: Soft. Bowel sounds are normal. She exhibits no distension. There is no tenderness. no masses Musculoskeletal:Back: full range of motion of thoracic and lumbar spine. Non tender to palpation. Negative straight leg raise. DTR's are symmetrically intact. Sensation intact in all dermatomes of the lower extremities. Full strength to manual muscle testing. patient is able to heel toe walk without difficulty and ambulates  with antalgic gait. Skin: Skin is warm and dry. No rash noted. No erythema.  Psychiatric: She has a normal mood and affect. Her behavior is normal. Judgment and thought content normal.   Lab Results  Component Value Date   WBC 8.1 10/26/2011   HGB 13.7 10/26/2011   HCT 41.1 10/26/2011   PLT 187.0 10/26/2011   GLUCOSE 100* 10/26/2011   CHOL 239* 08/07/2010   TRIG 102.0 08/07/2010   HDL 43.20 08/07/2010   LDLDIRECT 172.2 08/07/2010   ALT 21 10/26/2011   AST 25 10/26/2011   NA 137 10/26/2011   K 3.7 10/26/2011   CL 100 10/26/2011   CREATININE 1.2 10/26/2011   BUN 26* 10/26/2011   CO2 28 10/26/2011   TSH 4.43 02/15/2012   INR 2.01* 07/06/2010       Assessment & Plan:   L sciatica - classic symptoms - ER visit 1 week ago for same reviewed -  improving with ibuprofen - no neuro deficits on exam Increase dose/freq of ibuprofen and add muscle relaxer x 1 week - then both as needed Back exercises provided - education provided to pt at length Pt to call if worse or unimproved in next 4-6 weeks

## 2012-06-10 NOTE — Assessment & Plan Note (Signed)
Started nortriptyline qhs 12/2011 and use muscle relaxer as needed No joint swelling, chronic symptoms

## 2012-07-17 ENCOUNTER — Other Ambulatory Visit: Payer: Self-pay | Admitting: Internal Medicine

## 2012-07-18 ENCOUNTER — Ambulatory Visit: Payer: Medicare Other | Admitting: Pulmonary Disease

## 2012-07-21 ENCOUNTER — Ambulatory Visit (INDEPENDENT_AMBULATORY_CARE_PROVIDER_SITE_OTHER): Payer: Medicare Other | Admitting: Pulmonary Disease

## 2012-07-21 ENCOUNTER — Encounter: Payer: Self-pay | Admitting: Pulmonary Disease

## 2012-07-21 VITALS — BP 124/76 | HR 94 | Temp 97.3°F | Ht 63.0 in | Wt 200.6 lb

## 2012-07-21 DIAGNOSIS — J45991 Cough variant asthma: Secondary | ICD-10-CM

## 2012-07-21 NOTE — Assessment & Plan Note (Signed)
The patient seems to be doing well from a cough standpoint, but does have issues with perception of dyspnea that I suspect has nothing to do with her pulmonary status.  Her lungs were totally clear, she is moving adequate air, and actually breathes better when she is exercising more consistently.  I have asked her to stay on her current medical regimen, including treatment for reflux and allergic disease.  She will followup with me in 6 months.

## 2012-07-21 NOTE — Patient Instructions (Addendum)
Stay on your dulera twice a day.  Keep mouth rinsed well. Stay on medication for your acid reflux Can use claritin as needed for your allergies, but can also try chlorpheniramine 4mg   And take 2 at bedtime followup with me in 6mos.

## 2012-07-21 NOTE — Progress Notes (Signed)
  Subjective:    Patient ID: [name], female    DOB: 1941/01/09, 72 y.o.   MRN: [id number]  HPI The patient comes in today for followup of her known chronic cough, felt secondary to cough variant asthma and also upper airway irritation probably from reflux and postnasal drip.  She has been doing fairly well from a cough standpoint, and has been staying on her medications regularly.  Her only complaint today is that of extreme weakness, and an inability to get herself moving.  When she starts becoming active, she really does not experience shortness of breath after that.   Review of Systems  Constitutional: Positive for fatigue. Negative for fever and unexpected weight change.  HENT: Positive for congestion, rhinorrhea and postnasal drip. Negative for ear pain, nosebleeds, sore throat, sneezing, trouble swallowing, dental problem and sinus pressure.   Eyes: Negative for redness and itching.  Respiratory: Positive for shortness of breath. Negative for cough, chest tightness and wheezing.   Cardiovascular: Positive for leg swelling. Negative for palpitations.  Gastrointestinal: Negative for nausea and vomiting.  Genitourinary: Negative for dysuria.  Musculoskeletal: Negative for joint swelling.  Skin: Negative for rash.  Neurological: Negative for headaches.  Hematological: Does not bruise/bleed easily.  Psychiatric/Behavioral: Negative for dysphoric mood. The patient is nervous/anxious.        Objective:   Physical Exam Overweight female in no acute distress Nose purulence or discharge noted Neck without lymphadenopathy or thyromegaly Chest with clear breath sounds bilaterally, no wheezing Cardiac exam with regular rate and rhythm Lower extremities with 2+ edema bilaterally, no cyanosis  alert and oriented, moves all 4 extremities.       Assessment & Plan:

## 2012-08-04 ENCOUNTER — Other Ambulatory Visit (INDEPENDENT_AMBULATORY_CARE_PROVIDER_SITE_OTHER): Payer: Medicare Other

## 2012-08-04 ENCOUNTER — Ambulatory Visit (INDEPENDENT_AMBULATORY_CARE_PROVIDER_SITE_OTHER): Payer: Medicare Other | Admitting: Internal Medicine

## 2012-08-04 ENCOUNTER — Encounter: Payer: Self-pay | Admitting: Internal Medicine

## 2012-08-04 VITALS — BP 142/84 | HR 98 | Temp 98.0°F | Wt 202.4 lb

## 2012-08-04 DIAGNOSIS — Z1239 Encounter for other screening for malignant neoplasm of breast: Secondary | ICD-10-CM

## 2012-08-04 DIAGNOSIS — Z1231 Encounter for screening mammogram for malignant neoplasm of breast: Secondary | ICD-10-CM

## 2012-08-04 DIAGNOSIS — M5416 Radiculopathy, lumbar region: Secondary | ICD-10-CM

## 2012-08-04 DIAGNOSIS — IMO0002 Reserved for concepts with insufficient information to code with codable children: Secondary | ICD-10-CM

## 2012-08-04 DIAGNOSIS — M5430 Sciatica, unspecified side: Secondary | ICD-10-CM

## 2012-08-04 DIAGNOSIS — E785 Hyperlipidemia, unspecified: Secondary | ICD-10-CM

## 2012-08-04 DIAGNOSIS — M5432 Sciatica, left side: Secondary | ICD-10-CM

## 2012-08-04 LAB — LIPID PANEL
Cholesterol: 194 mg/dL (ref 0–200)
HDL: 48 mg/dL (ref >39.00)
LDL Cholesterol: 121 mg/dL — ABNORMAL HIGH (ref 0–99)
Total CHOL/HDL Ratio: 4
Triglycerides: 125 mg/dL (ref 0.0–149.0)
VLDL: 25 mg/dL (ref 0.0–40.0)

## 2012-08-04 MED ORDER — IBUPROFEN 400 MG PO TABS: 400.0000 mg | ORAL_TABLET | Freq: Three times a day (TID) | ORAL | Status: DC

## 2012-08-04 NOTE — Progress Notes (Signed)
Subjective:    Patient ID: [name], female    DOB: [birth year], 72 y.o.   MRN: [id number]  Hip Pain    Continued pain since last OV  seen in ER 05/2012 for LLE pain - dx sciatica, tx with ibuprofen Hx same ?4 years ago -  No trauma or falls, no weakness or numbness  Past Medical History  Diagnosis Date  . INSOMNIA-SLEEP DISORDER-UNSPEC   . KNEE PAIN, BILATERAL   . OSTEOPENIA   . LEG EDEMA   . Headache   . RHINITIS, ALLERGIC   . GRAVES' DISEASE   . CONVULSIONS, SEIZURES, NOS   . HYPERLIPIDEMIA   . GASTROESOPHAGEAL REFLUX, NO ESOPHAGITIS   . DEPRESSION   . OSTEOARTHRITIS, LOWER LEG     R TKR 07/2010  . Fibromyalgia     Review of Systems  Constitutional: Negative for fever, fatigue and unexpected weight change.  Musculoskeletal: Positive for back pain. Negative for myalgias, joint swelling and gait problem.  Skin: Negative for rash and wound.       Objective:   Physical Exam  BP 142/84  Pulse 98  Temp(Src) 98 F (36.7 C) (Oral)  Wt 202 lb 6.4 oz (91.808 kg)  BMI 35.86 kg/m2  SpO2 98% Wt Readings from Last 3 Encounters:  08/04/12 202 lb 6.4 oz (91.808 kg)  07/21/12 200 lb 9.6 oz (90.992 kg)  06/10/12 199 lb 12.8 oz (90.629 kg)   Constitutional: She appears well-developed and well-nourished. No distress.  Cardiovascular: Normal rate, regular rhythm and normal heart sounds.  No murmur heard. No BLE edema. Pulmonary/Chest: Effort normal and breath sounds normal. No respiratory distress. She has no wheezes.  Musculoskeletal:Back: full range of motion of thoracic and lumbar spine. Non tender to palpation. Negative straight leg raise. DTR's are symmetrically intact. Sensation intact in all dermatomes of the lower extremities. Full strength to manual muscle testing. patient is able to heel toe walk without difficulty and ambulates with antalgic gait. Skin: Skin is warm and dry. No rash noted. No erythema.  Psychiatric: She has a normal mood and affect. Her  behavior is normal. Judgment and thought content normal.   Lab Results  Component Value Date   WBC 8.1 10/26/2011   HGB 13.7 10/26/2011   HCT 41.1 10/26/2011   PLT 187.0 10/26/2011   GLUCOSE 100* 10/26/2011   CHOL 239* 08/07/2010   TRIG 102.0 08/07/2010   HDL 43.20 08/07/2010   LDLDIRECT 172.2 08/07/2010   ALT 21 10/26/2011   AST 25 10/26/2011   NA 137 10/26/2011   K 3.7 10/26/2011   CL 100 10/26/2011   CREATININE 1.2 10/26/2011   BUN 26* 10/26/2011   CO2 28 10/26/2011   TSH 4.43 02/15/2012   INR 2.01* 07/06/2010   3/2011MRI L spine: Clinical Data: Low back pain.  Right hip and buttock pain.  Leg pain.  Weakness.    MRI LUMBAR SPINE WITHOUT CONTRAST    Technique:  Multiplanar and multiecho pulse sequences of the lumbar spine were obtained without intravenous contrast.    Comparison: None.    Findings: The lowest full intervertebral disk space is labeled L5- S1.  If procedural intervention is to be performed, careful correlation with this numbering strategy is recommended.    The conus medullaris appears unremarkable.  Conus level: L1.    Intervertebral disc desiccation is noted at all levels in the lumbar spine.    Normal vertebral alignment is maintained, without significant subluxation.    Pedicles in the lumbar spine  appear congenitally short. Inversion recovery weighted images demonstrate no significant abnormal vertebral or periligamentous edema.    Additional findings at individual levels are as follows:    T11-12:  Left eccentric disc bulge. The AP diameter of the thecal sac is 8 mm compatible with moderate central stenosis. Bilateral facet arthropathy causes moderate right and prominent left foraminal stenosis.    T12-L1:  Left eccentric disc bulge and facet arthropathy noted. There is mild to moderate bilateral foraminal stenosis.    L1-2:  Mild left eccentric disc bulge noted with left facet arthropathy.  There is mild to moderate left foraminal stenosis.    L2-3:   Diffuse disc bulge and mild facet arthropathy noted with mild bilateral foraminal stenosis. The AP diameter of the thecal sac is 9 mm, compatible with mild central stenosis.    L3-4:  Disc bulge noted with bilateral facet arthropathy. The AP diameter of the thecal sac is 9 mm, compatible with mild central stenosis. There is mild right and borderline left subarticular lateral recess stenosis.  There is also mild bilateral foraminal stenosis.    L4-5:  Disc bulge, broad central disc protrusion, and bilateral facet arthropathy noted.  There is mild to moderate bilateral foraminal stenosis. The AP diameter of the thecal sac is narrowed to 5 mm compatible with severe central stenosis. There is moderate bilateral subarticular lateral recess stenosis.    L5-S1:  Facet arthropathy noted along with disc bulge.  There is mild bilateral foraminal stenosis as well as borderline left subarticular lateral recess stenosis.    IMPRESSION:    1.  Congenitally short pedicles, degenerative disc disease, and spondylosis cause multilevel stenoses as detailed above.   Provider: Fabiola Backer     Assessment & Plan:   L sciatica and L LBP- classic symptoms - ongoing > 6 weeks Min improved with medical care (NSAIDs, muscle relaxer and home PT) MRI L spine 05/2009 reviewed as above Refer for new MRI and spine specialist eval and tx - [facility] to call re: same

## 2012-08-04 NOTE — Patient Instructions (Signed)
It was good to see you today. We have reviewed your records including labs and tests today Continue ibuprofen 400mg  three times daily with food x 1 week, then as needed for pain Your refill prescription(s) have been submitted to your pharmacy. Please take as directed and contact our office if you believe you are having problem(s) with the medication(s). Test(s) ordered today. Your results will be released to MyChart (or called to you) after review, usually within 72hours after test completion. If any changes need to be made, you will be notified at that same time. we'll make referral to spine specialist for evaluation and for MRI of your back to help with leg pain. Our office will contact you regarding appointment(s) once made Please schedule followup in 3-6 months for blood pressure and weight check, call sooner if problems.  Back Exercises Back exercises help treat and prevent back injuries. The goal of back exercises is to increase the strength of your abdominal and back muscles and the flexibility of your back. These exercises should be started when you no longer have back pain. Back exercises include:  Pelvic Tilt. Lie on your back with your knees bent. Tilt your pelvis until the lower part of your back is against the floor. Hold this position 5 to 10 sec and repeat 5 to 10 times.  Knee to Chest. Pull first 1 knee up against your chest and hold for 20 to 30 seconds, repeat this with the other knee, and then both knees. This may be done with the other leg straight or bent, whichever feels better.  Sit-Ups or Curl-Ups. Bend your knees 90 degrees. Start with tilting your pelvis, and do a partial, slow sit-up, lifting your trunk only 30 to 45 degrees off the floor. Take at least 2 to 3 seconds for each sit-up. Do not do sit-ups with your knees out straight. If partial sit-ups are difficult, simply do the above but with only tightening your abdominal muscles and holding it as directed.  Hip-Lift.  Lie on your back with your knees flexed 90 degrees. Push down with your feet and shoulders as you raise your hips a couple inches off the floor; hold for 10 seconds, repeat 5 to 10 times.  Back arches. Lie on your stomach, propping yourself up on bent elbows. Slowly press on your hands, causing an arch in your low back. Repeat 3 to 5 times. Any initial stiffness and discomfort should lessen with repetition over time.  Shoulder-Lifts. Lie face down with arms beside your body. Keep hips and torso pressed to floor as you slowly lift your head and shoulders off the floor. Do not overdo your exercises, especially in the beginning. Exercises may cause you some mild back discomfort which lasts for a few minutes; however, if the pain is more severe, or lasts for more than 15 minutes, do not continue exercises until you see your caregiver. Improvement with exercise therapy for back problems is slow.  See your caregivers for assistance with developing a proper back exercise program. Document Released: 04/26/2004 Document Revised: 06/11/2011 Document Reviewed: 01/18/2011 [facility] Patient Information 2013 [place], [place].

## 2012-08-04 NOTE — Assessment & Plan Note (Signed)
inconsistent statin - reports compliance related to $$$ Recheck annually and adjust as needed

## 2012-08-25 ENCOUNTER — Encounter: Payer: Self-pay | Admitting: *Deleted

## 2012-08-29 ENCOUNTER — Telehealth: Payer: Self-pay | Admitting: Internal Medicine

## 2012-08-29 NOTE — Telephone Encounter (Signed)
Pt needs an order for a mammogram [facility] on [address].

## 2012-08-29 NOTE — Telephone Encounter (Signed)
Called pt back inform her mammogram has already been schedule per chart. Gave her appt information...[name]

## 2012-08-30 ENCOUNTER — Encounter: Payer: Self-pay | Admitting: Cardiovascular Disease

## 2012-09-01 ENCOUNTER — Ambulatory Visit (INDEPENDENT_AMBULATORY_CARE_PROVIDER_SITE_OTHER): Payer: Medicare Other | Admitting: Cardiovascular Disease

## 2012-09-01 ENCOUNTER — Encounter: Payer: Self-pay | Admitting: Cardiovascular Disease

## 2012-09-01 VITALS — BP 110/78 | HR 76 | Resp 16 | Ht 63.0 in | Wt 200.5 lb

## 2012-09-01 DIAGNOSIS — Z79899 Other long term (current) drug therapy: Secondary | ICD-10-CM

## 2012-09-01 DIAGNOSIS — F519 Sleep disorder not due to a substance or known physiological condition, unspecified: Secondary | ICD-10-CM

## 2012-09-01 DIAGNOSIS — R609 Edema, unspecified: Secondary | ICD-10-CM

## 2012-09-01 DIAGNOSIS — R079 Chest pain, unspecified: Secondary | ICD-10-CM

## 2012-09-01 DIAGNOSIS — E782 Mixed hyperlipidemia: Secondary | ICD-10-CM

## 2012-09-01 DIAGNOSIS — I10 Essential (primary) hypertension: Secondary | ICD-10-CM

## 2012-09-01 DIAGNOSIS — R600 Localized edema: Secondary | ICD-10-CM

## 2012-09-01 DIAGNOSIS — E785 Hyperlipidemia, unspecified: Secondary | ICD-10-CM

## 2012-09-01 LAB — COMPREHENSIVE METABOLIC PANEL
ALT: 14 U/L (ref 0–35)
AST: 16 U/L (ref 0–37)
Alkaline Phosphatase: 71 U/L (ref 39–117)
BUN: 26 mg/dL — ABNORMAL HIGH (ref 6–23)
Calcium: 9.7 mg/dL (ref 8.4–10.5)
Creat: 1.14 mg/dL — ABNORMAL HIGH (ref 0.50–1.10)
Total Bilirubin: 0.4 mg/dL (ref 0.3–1.2)

## 2012-09-01 LAB — LIPID PANEL
HDL: 47 mg/dL (ref >39)
LDL Cholesterol: 163 mg/dL — ABNORMAL HIGH (ref 0–99)
Total CHOL/HDL Ratio: 5.3 Ratio
VLDL: 37 mg/dL (ref 0–40)

## 2012-09-01 NOTE — Patient Instructions (Addendum)
Your physician discussed the importance of regular exercise and recommended that you start or continue a regular exercise program for good health.  Your physician recommends that you schedule a follow-up appointment in: 12 months  Your physician recommends that you perform lab work today

## 2012-09-02 ENCOUNTER — Telehealth: Payer: Self-pay | Admitting: *Deleted

## 2012-09-02 DIAGNOSIS — E782 Mixed hyperlipidemia: Secondary | ICD-10-CM

## 2012-09-02 NOTE — Telephone Encounter (Signed)
Message copied by Barrie Dunker on Tue Sep 02, 2012  1:45 PM ------      Message from: Thurmon Fair      Created: Tue Sep 02, 2012 11:48 AM       Please recheck lipid panel in three months ------

## 2012-09-10 ENCOUNTER — Ambulatory Visit
Admission: RE | Admit: 2012-09-10 | Discharge: 2012-09-10 | Disposition: A | Discharge status: Home / Self Care | Payer: Medicare Other | Source: Ambulatory Visit | Attending: Internal Medicine | Admitting: Internal Medicine

## 2012-09-10 DIAGNOSIS — Z1231 Encounter for screening mammogram for malignant neoplasm of breast: Secondary | ICD-10-CM

## 2012-09-11 ENCOUNTER — Ambulatory Visit
Admission: RE | Admit: 2012-09-11 | Discharge: 2012-09-11 | Disposition: A | Discharge status: Home / Self Care | Payer: Medicare Other | Source: Ambulatory Visit | Attending: Internal Medicine | Admitting: Internal Medicine

## 2012-09-11 DIAGNOSIS — M5416 Radiculopathy, lumbar region: Secondary | ICD-10-CM

## 2012-09-11 DIAGNOSIS — M5432 Sciatica, left side: Secondary | ICD-10-CM

## 2012-09-23 ENCOUNTER — Encounter: Payer: Self-pay | Admitting: Cardiovascular Disease

## 2012-09-23 DIAGNOSIS — E785 Hyperlipidemia, unspecified: Secondary | ICD-10-CM | POA: Insufficient documentation

## 2012-09-23 DIAGNOSIS — I10 Essential (primary) hypertension: Secondary | ICD-10-CM | POA: Insufficient documentation

## 2012-09-23 NOTE — Progress Notes (Signed)
Patient ID: [name], female   DOB: 04-04-1940, 72 y.o.   MRN: [id number]     Reason for office visit Hyperlipidemia, hypertension followup  [name] is soon to be 72 years old and has hypertension and hyperlipidemia but very little in the way of structural heart disease. She had mildly dilated left atrium by echocardiography. A nuclear stress test in 2011 was normal. Left ventricular systolic function is normal with an EF estimated at about 64%. She has had chronic complaints of lower showed edema although these are less prominent today. She has issues with shortness of breath or she sees Dr. Stann Mainland. Spirometry performed in June of last year showed normal results. A sleep study has been performed and showed upper airway resistance syndrome but very mild sleep apnea  Recently her biggest problems have been related to back pain and leg pain consistent with the diagnosis of fibromyalgia that she bears.  No Known Allergies  Current Outpatient Prescriptions  Medication Sig Dispense Refill  . Calcium Carb-Cholecalciferol (CALCIUM 1000 + D PO) Take 1 tablet by mouth 3 (three) times daily.      . cyclobenzaprine (FLEXERIL) 5 MG tablet Take 1 tablet (5 mg total) by mouth at bedtime. X 1 week, then as needed for muscle spasms  30 tablet  0  . divalproex (DEPAKOTE ER) 500 MG 24 hr tablet Take 500 mg by mouth 2 (two) times daily.   30 tablet  6  . ibuprofen (ADVIL,MOTRIN) 400 MG tablet Take 1 tablet (400 mg total) by mouth 3 (three) times daily with meals. X 1 week, then as needed for pain  40 tablet  0  . losartan-hydrochlorothiazide (HYZAAR) 50-12.5 MG per tablet Take 1 tablet by mouth daily.      . mometasone-formoterol (DULERA) 100-5 MCG/ACT AERO Inhale 2 puffs into the lungs 2 (two) times daily.      Marland Kitchen omeprazole (PRILOSEC) 40 MG capsule       . PROAIR HFA 108 (90 BASE) MCG/ACT inhaler inhale 2 puffs by mouth every 6 hours AS NEEDED FOR WHEEZING  8.5 g  1  . propranolol ER (INDERAL  LA) 120 MG 24 hr capsule Take 1 by mouth daily  30 capsule  5  . simvastatin (ZOCOR) 20 MG tablet Take 20 mg by mouth every evening.      . loratadine (CLARITIN) 10 MG tablet Take 1 tablet (10 mg total) by mouth daily as needed for allergies.  30 tablet  2  . nortriptyline (PAMELOR) 10 MG capsule take 1 capsule by mouth at bedtime  30 capsule  5  . valACYclovir (VALTREX) 1000 MG tablet Take 1,000 mg by mouth daily as needed (for outbreaks).        No current facility-administered medications for this visit.    Past Medical History  Diagnosis Date  . INSOMNIA-SLEEP DISORDER-UNSPEC   . KNEE PAIN, BILATERAL   . OSTEOPENIA   . LEG EDEMA   . Headache(784.0)   . RHINITIS, ALLERGIC   . GRAVES' DISEASE   . CONVULSIONS, SEIZURES, NOS   . HYPERLIPIDEMIA   . GASTROESOPHAGEAL REFLUX, NO ESOPHAGITIS   . DEPRESSION   . OSTEOARTHRITIS, LOWER LEG     R TKR 07/2010  . Fibromyalgia   . Hypertension     Past Surgical History  Procedure Laterality Date  . Tubal ligation  04/28/03  . Right knee arthroscopy  2006  . Total knee arthroplasty  07/06/2010    right TKR - [facility]an  . Doppler echocardiography  06/21/2011    EF=55%; LV norm and systolic function and mild finding of diastolic  . Sleep study  07/21/2011    mild obstructive sleep apnea & upper airway resistnce syndrome did not justify with CPAP.  Marland Kitchen Felicity Coyer doppplers  03/02/2010    no evidence of DVTno comment on prescence or absence of perip. venous insuff.  . Nm myocar perf wall motion  08/11/2009    EF 64%;LV norm  . Nm myocar perf wall motion  10/22/2005    EF 67%  LV norm    Family History  Problem Relation Age of Onset  . Diabetes Mother   . Osteoarthritis Mother   . Hyperlipidemia Mother   . Alzheimer's disease Mother   . Prostate cancer Father   . Osteoarthritis Brother   . Colon polyps Brother   . Prostate cancer Brother   . Alcohol abuse Brother     History   Social History  . Marital Status: Divorced    Spouse Name: N/A      Number of Children: N/A  . Years of Education: N/A   Occupational History  . service area    Social History Main Topics  . Smoking status: Never Smoker   . Smokeless tobacco: Never Used     Comment: Lives with mother who has alzheimers (follows with home hospice) and brother who assist with care.   . Alcohol Use: No  . Drug Use: No  . Sexually Active: Not on file   Other Topics Concern  . Not on file   Social History Narrative  . No narrative on file    Review of systems: The patient specifically denies any chest pain at rest exertion, dyspnea at rest, orthopnea, paroxysmal nocturnal dyspnea, syncope, palpitations, focal neurological deficits, intermittent claudication, lower extremity edema, unexplained weight gain, cough, hemoptysis or wheezing.   PHYSICAL EXAM BP 110/78  Pulse 76  Resp 16  Ht 5\' 3"  (1.6 m)  Wt 90.946 kg (200 lb 8 oz)  BMI 35.53 kg/m2  General: Alert, oriented x3, no distress, moderately obese Head: no evidence of trauma, PERRL, EOMI, no exophtalmos or lid lag, no myxedema, no xanthelasma; normal ears, nose and oropharynx Neck: normal jugular venous pulsations and no hepatojugular reflux; brisk carotid pulses without delay and no carotid bruits Chest: clear to auscultation, no signs of consolidation by percussion or palpation, normal fremitus, symmetrical and full respiratory excursions Cardiovascular: normal position and quality of the apical impulse, regular rhythm, normal first and second heart sounds, no murmurs, rubs or gallops Abdomen: no tenderness or distention, no masses by palpation, no abnormal pulsatility or arterial bruits, normal bowel sounds, no hepatosplenomegaly Extremities: no clubbing, cyanosis or edema; 2+ radial, ulnar and brachial pulses bilaterally; 2+ right femoral, posterior tibial and dorsalis pedis pulses; 2+ left femoral, posterior tibial and dorsalis pedis pulses; no subclavian or femoral bruits Neurological: grossly  nonfocal   EKG: Sinus rhythm  Lipid Panel (performed later on the day of the visit)    Component Value Date/Time   CHOL 247* 09/01/2012 1230   TRIG 183* 09/01/2012 1230   HDL 47 09/01/2012 1230   CHOLHDL 5.3 09/01/2012 1230   VLDL 37 09/01/2012 1230   LDLCALC 163* 09/01/2012 1230    BMET    Component Value Date/Time   NA 140 09/01/2012 1230   K 4.7 09/01/2012 1230   CL 100 09/01/2012 1230   CO2 28 09/01/2012 1230   GLUCOSE 91 09/01/2012 1230   BUN 26* 09/01/2012 1230  CREATININE 1.14* 09/01/2012 1230   CREATININE 1.2 10/26/2011 1531   CALCIUM 9.7 09/01/2012 1230   GFRNONAA >60 07/04/2010 0520   GFRAA  Value: >60        The eGFR has been calculated using the MDRD equation. This calculation has not been validated in all clinical situations. eGFR's persistently <60 mL/min signify possible Chronic Kidney Disease. 07/04/2010 0520     ASSESSMENT AND PLAN HYPERLIPIDEMIA Her most recent labs from roughly one year ago showed total cholesterol 166 triglycerides 142 HDL 44 LDL 94 all of which were within the desirable range. Liver function tests were normal. It is time to repeat these tests.  HTN (hypertension) Well controlled. Note borderline creatinine of 1.14.  Reduction in weight will likely help her dyspnea.  Orders Placed This Encounter  Procedures  . Comp Met (CMET)  . Lipid Profile  . EKG 12-Lead     Bridney Guadarrama  Thurmon Fair, MD, [facility] [phone] office [phone] pager  2

## 2012-09-23 NOTE — Assessment & Plan Note (Signed)
Her most recent labs from roughly one year ago showed total cholesterol 166 triglycerides 142 HDL 44 LDL 94 all of which were within the desirable range. Liver function tests were normal. It is time to repeat these tests.

## 2012-09-23 NOTE — Assessment & Plan Note (Signed)
Well controlled. Note borderline creatinine of 1.14.

## 2012-10-22 ENCOUNTER — Other Ambulatory Visit: Payer: Self-pay | Admitting: Family Medicine

## 2012-10-22 DIAGNOSIS — R109 Unspecified abdominal pain: Secondary | ICD-10-CM

## 2012-10-22 DIAGNOSIS — I83893 Varicose veins of bilateral lower extremities with other complications: Secondary | ICD-10-CM

## 2012-10-24 ENCOUNTER — Other Ambulatory Visit: Payer: Medicare Other

## 2012-10-29 ENCOUNTER — Other Ambulatory Visit: Payer: Medicare Other

## 2012-11-13 ENCOUNTER — Ambulatory Visit
Admission: RE | Admit: 2012-11-13 | Discharge: 2012-11-13 | Disposition: A | Discharge status: Home / Self Care | Payer: PRIVATE HEALTH INSURANCE | Source: Ambulatory Visit | Attending: Family Medicine | Admitting: Family Medicine

## 2012-11-13 DIAGNOSIS — R109 Unspecified abdominal pain: Secondary | ICD-10-CM

## 2012-11-13 DIAGNOSIS — I83893 Varicose veins of bilateral lower extremities with other complications: Secondary | ICD-10-CM

## 2012-11-14 ENCOUNTER — Encounter: Payer: Self-pay | Admitting: Radiology

## 2012-11-26 ENCOUNTER — Other Ambulatory Visit: Payer: Self-pay | Admitting: Pulmonary Disease

## 2012-11-26 ENCOUNTER — Other Ambulatory Visit: Payer: Self-pay | Admitting: Nurse Practitioner

## 2013-01-20 ENCOUNTER — Ambulatory Visit: Payer: Medicare Other | Admitting: Pulmonary Disease

## 2013-01-21 ENCOUNTER — Encounter: Payer: Self-pay | Admitting: Pulmonary Disease

## 2013-02-14 ENCOUNTER — Other Ambulatory Visit: Payer: Self-pay | Admitting: Neurology

## 2013-02-14 ENCOUNTER — Other Ambulatory Visit: Payer: Self-pay | Admitting: Internal Medicine

## 2013-03-12 ENCOUNTER — Other Ambulatory Visit: Payer: Self-pay | Admitting: Neurology

## 2013-03-19 ENCOUNTER — Telehealth: Payer: Self-pay | Admitting: Neurology

## 2013-03-19 ENCOUNTER — Other Ambulatory Visit: Payer: Self-pay | Admitting: Internal Medicine

## 2013-03-19 NOTE — Telephone Encounter (Signed)
Patient wants to schedule an appointment to see Dr. Terrace Arabia regarding her head and whether she should continue taking her medication. Please call the patient.

## 2013-03-20 ENCOUNTER — Encounter: Payer: Self-pay | Admitting: Internal Medicine

## 2013-03-20 ENCOUNTER — Ambulatory Visit (INDEPENDENT_AMBULATORY_CARE_PROVIDER_SITE_OTHER): Payer: PRIVATE HEALTH INSURANCE | Admitting: Internal Medicine

## 2013-03-20 VITALS — BP 120/72 | HR 80 | Temp 98.6°F | Wt 193.8 lb

## 2013-03-20 DIAGNOSIS — F411 Generalized anxiety disorder: Secondary | ICD-10-CM

## 2013-03-20 DIAGNOSIS — Z124 Encounter for screening for malignant neoplasm of cervix: Secondary | ICD-10-CM

## 2013-03-20 DIAGNOSIS — I10 Essential (primary) hypertension: Secondary | ICD-10-CM

## 2013-03-20 DIAGNOSIS — F329 Major depressive disorder, single episode, unspecified: Secondary | ICD-10-CM

## 2013-03-20 DIAGNOSIS — E785 Hyperlipidemia, unspecified: Secondary | ICD-10-CM

## 2013-03-20 DIAGNOSIS — F418 Other specified anxiety disorders: Secondary | ICD-10-CM

## 2013-03-20 MED ORDER — BUSPIRONE HCL 10 MG PO TABS: 10.0000 mg | ORAL_TABLET | Freq: Three times a day (TID) | ORAL | Status: DC

## 2013-03-20 MED ORDER — DIVALPROEX SODIUM ER 500 MG PO TB24: 500.0000 mg | ORAL_TABLET | Freq: Two times a day (BID) | ORAL | Status: DC

## 2013-03-20 MED ORDER — CYCLOBENZAPRINE HCL 5 MG PO TABS: 5.0000 mg | ORAL_TABLET | Freq: Every day | ORAL | Status: AC

## 2013-03-20 MED ORDER — NORTRIPTYLINE HCL 25 MG PO CAPS: 25.0000 mg | ORAL_CAPSULE | Freq: Every day | ORAL | Status: DC

## 2013-03-20 NOTE — Assessment & Plan Note (Signed)
BP Readings from Last 3 Encounters:  03/20/13 120/72  09/01/12 110/78  08/04/12 142/84   The current medical regimen is effective;  continue present plan and medications.

## 2013-03-20 NOTE — Patient Instructions (Signed)
It was good to see you today.  We have reviewed your prior records including labs and tests today  I am sorry for your loss  Medications reviewed and updated  Increase Pamelor to 25 mg at bedtime Use BuSpar 3 times daily as needed for anxiety/nerves  No changes at this time  Your prescription(s) have been submitted to your pharmacy. Please take as directed and contact our office if you believe you are having problem(s) with the medication(s).  Please schedule followup in 3-4 months, call sooner if problems.

## 2013-03-20 NOTE — Assessment & Plan Note (Signed)
exac by family stressors (ill mom and brother, daughter's death Feb 28, 2013) - inconsistent compliance with prior SSRI and SNRI  Increase pamelor qhs now and buspar prn

## 2013-03-20 NOTE — Telephone Encounter (Signed)
Please disregard message below. Patient needed to schedule an appointment and to have her Depakote ER Rx refilled. Please take care of this, J. Banks, RPhA

## 2013-03-20 NOTE — Progress Notes (Signed)
Pre-visit discussion using our clinic review tool. No additional management support is needed unless otherwise documented below in the visit note.  

## 2013-03-20 NOTE — Telephone Encounter (Signed)
Patient has been sched. For f/u appt
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
## 2013-03-20 NOTE — Assessment & Plan Note (Signed)
rx'd statin - reports compliance related to $$$ Recheck annually and adjust as needed 

## 2013-03-20 NOTE — Telephone Encounter (Signed)
Rx has been sent  

## 2013-03-24 ENCOUNTER — Ambulatory Visit (INDEPENDENT_AMBULATORY_CARE_PROVIDER_SITE_OTHER): Payer: PRIVATE HEALTH INSURANCE | Admitting: Pulmonary Disease

## 2013-03-24 ENCOUNTER — Encounter: Payer: Self-pay | Admitting: Pulmonary Disease

## 2013-03-24 VITALS — BP 140/88 | HR 70 | Temp 97.9°F | Ht 64.0 in | Wt 200.8 lb

## 2013-03-24 DIAGNOSIS — J45991 Cough variant asthma: Secondary | ICD-10-CM

## 2013-03-24 NOTE — Assessment & Plan Note (Signed)
I have stressed to the patient the importance of staying on dulera on a regular basis twice a day regardless of how she feels. I also wonder if she is having more issues with postnasal drip, and asked her to try Zyrtec in the place of Claritin. If she continues to have issues, we may consider dymista nasal spray.

## 2013-03-24 NOTE — Progress Notes (Signed)
   Subjective:    Patient ID: [name], female    DOB: Aug 28, 1940, 72 y.o.   MRN: [id number]  HPI Patient comes in today for followup of her known cough, secondary to cough variant asthma and also postnasal drip and probable LPR. She does very well during the day, but starts to notice increased tickle and cough at night and upon lying down. She has been staying on her acid reflux medicine and Claritin, but has not been taking dulera compliantly. I stressed to her the importance of this.   Review of Systems  Constitutional: Negative for fever and unexpected weight change.  HENT: Positive for postnasal drip and sinus pressure. Negative for congestion, dental problem, ear pain, nosebleeds, rhinorrhea, sneezing, sore throat and trouble swallowing.   Eyes: Negative for redness and itching.  Respiratory: Positive for cough, shortness of breath and wheezing. Negative for chest tightness.   Cardiovascular: Positive for leg swelling. Negative for palpitations.  Gastrointestinal: Negative for nausea and vomiting.  Genitourinary: Negative for dysuria.  Musculoskeletal: Negative for joint swelling.  Skin: Negative for rash.  Neurological: Negative for headaches.  Hematological: Does not bruise/bleed easily.  Psychiatric/Behavioral: Negative for dysphoric mood. The patient is nervous/anxious.        Objective:   Physical Exam Overweight female in no acute distress Nose without purulence or discharge noted Neck without lymphadenopathy or thyromegaly Chest totally clear to auscultation, no wheezing Cardiac exam with regular rate and rhythm Lower extremities with 2+ edema, no cyanosis Alert and oriented, moves all 4 extremities.       Assessment & Plan:

## 2013-03-24 NOTE — Patient Instructions (Signed)
Stay on dulera 2 puffs am AND pm everyday no matter how your feel.  Keep mouth rinsed out. Try taking zyrtec 10mg  each night instead of claritin to see if works better.  We are out of samples for nasal spray.  Let us know if you continue to have issues, and will probably have more after the first of the year.  followup with me in 6mos.

## 2013-05-02 ENCOUNTER — Other Ambulatory Visit: Payer: Self-pay | Admitting: Internal Medicine

## 2013-05-28 ENCOUNTER — Ambulatory Visit: Payer: Self-pay | Admitting: Neurology

## 2013-06-08 ENCOUNTER — Encounter: Payer: PRIVATE HEALTH INSURANCE | Admitting: Obstetrics & Gynecology

## 2013-06-09 ENCOUNTER — Encounter: Payer: Self-pay | Admitting: *Deleted

## 2013-06-12 ENCOUNTER — Ambulatory Visit: Payer: PRIVATE HEALTH INSURANCE | Admitting: Internal Medicine

## 2013-07-22 ENCOUNTER — Ambulatory Visit (INDEPENDENT_AMBULATORY_CARE_PROVIDER_SITE_OTHER)
Admission: RE | Admit: 2013-07-22 | Discharge: 2013-07-22 | Disposition: A | Discharge status: Home / Self Care | Payer: Medicare HMO | Source: Ambulatory Visit | Attending: Physician Assistant | Admitting: Physician Assistant

## 2013-07-22 ENCOUNTER — Ambulatory Visit: Payer: PRIVATE HEALTH INSURANCE | Admitting: Family

## 2013-07-22 ENCOUNTER — Encounter: Payer: Self-pay | Admitting: Physician Assistant

## 2013-07-22 ENCOUNTER — Other Ambulatory Visit: Payer: Self-pay | Admitting: Neurology

## 2013-07-22 ENCOUNTER — Ambulatory Visit (INDEPENDENT_AMBULATORY_CARE_PROVIDER_SITE_OTHER): Payer: Medicare HMO | Admitting: Physician Assistant

## 2013-07-22 VITALS — BP 120/74 | HR 81 | Temp 97.4°F | Resp 16 | Wt 199.0 lb

## 2013-07-22 DIAGNOSIS — R208 Other disturbances of skin sensation: Secondary | ICD-10-CM

## 2013-07-22 DIAGNOSIS — R05 Cough: Secondary | ICD-10-CM

## 2013-07-22 DIAGNOSIS — J309 Allergic rhinitis, unspecified: Secondary | ICD-10-CM

## 2013-07-22 DIAGNOSIS — H6120 Impacted cerumen, unspecified ear: Secondary | ICD-10-CM

## 2013-07-22 DIAGNOSIS — R059 Cough, unspecified: Secondary | ICD-10-CM

## 2013-07-22 DIAGNOSIS — R053 Chronic cough: Secondary | ICD-10-CM

## 2013-07-22 DIAGNOSIS — R209 Unspecified disturbances of skin sensation: Secondary | ICD-10-CM

## 2013-07-22 NOTE — Progress Notes (Signed)
Pre visit review using our clinic review tool, if applicable. No additional management support is needed unless otherwise documented below in the visit note. 

## 2013-07-22 NOTE — Progress Notes (Signed)
Subjective:    Patient ID: [name], female    DOB: 16-Apr-1940, 73 y.o.   MRN: [id number]  Cough This is a new problem. The current episode started more than 1 month ago (3 to 4 months). The problem has been unchanged. Episode frequency: Intermittently. The cough is productive of bloody sputum (Blood and blood clots in sputum). Associated symptoms include ear congestion, headaches, hemoptysis, nasal congestion, postnasal drip, rhinorrhea, a sore throat, shortness of breath and wheezing. Pertinent negatives include no chest pain, chills, ear pain, fever, heartburn, myalgias, rash, sweats or weight loss. Exacerbated by: Environmental allergies and cigarette smoke. Risk factors: Nonsmoking female with smoking relatives. She has tried nothing for the symptoms.  Other This is a new (Sensation that her feet are burning, also occasional numbness and tingling like her feet are asleep) problem. The current episode started more than 1 month ago (2-3 months ago). The problem occurs daily (Throughout day and while lying in bed at night). The problem has been gradually worsening. Associated symptoms include congestion, coughing, fatigue, headaches, numbness and a sore throat. Pertinent negatives include no abdominal pain, anorexia, arthralgias, chest pain, chills, fever, joint swelling, myalgias, nausea, rash, vomiting or weakness. Associated symptoms comments: Occasional leg cramping when walking. The symptoms are aggravated by walking. She has tried ice and rest (Placing feet on something cool helps) for the symptoms. The treatment provided mild relief.      Review of Systems  Constitutional: Positive for fatigue. Negative for fever, chills, weight loss and unexpected weight change.  HENT: Positive for congestion, postnasal drip, rhinorrhea and sore throat. Negative for ear discharge, ear pain, sinus pressure and sneezing.   Respiratory: Positive for cough, hemoptysis, shortness of breath and  wheezing. Negative for apnea, choking, chest tightness and stridor.   Cardiovascular: Negative for chest pain, palpitations and leg swelling.  Gastrointestinal: Negative for heartburn, nausea, vomiting, abdominal pain and anorexia.  Musculoskeletal: Negative for arthralgias, gait problem, joint swelling and myalgias.  Skin: Negative for color change, pallor and rash.  Neurological: Positive for numbness and headaches. Negative for weakness.  All other systems reviewed and are negative.  Past Medical History  Diagnosis Date  . INSOMNIA-SLEEP DISORDER-UNSPEC   . KNEE PAIN, BILATERAL   . OSTEOPENIA   . LEG EDEMA   . Headache(784.0)   . RHINITIS, ALLERGIC   . GRAVES' DISEASE   . CONVULSIONS, SEIZURES, NOS   . HYPERLIPIDEMIA   . GASTROESOPHAGEAL REFLUX, NO ESOPHAGITIS   . DEPRESSION   . OSTEOARTHRITIS, LOWER LEG     R TKR 07/2010  . Fibromyalgia   . Hypertension    Past Surgical History  Procedure Laterality Date  . Tubal ligation  04/28/03  . Right knee arthroscopy  2006  . Total knee arthroplasty  07/06/2010    right TKR - [facility]  . Doppler echocardiography  06/21/2011    EF=55%; LV norm and systolic function and mild finding of diastolic  . Sleep study  07/21/2011    mild obstructive sleep apnea & upper airway resistnce syndrome did not justify with CPAP.  Marland Kitchen Eusebio Me doppplers  03/02/2010    no evidence of DVTno comment on prescence or absence of perip. venous insuff.  . Nm myocar perf wall motion  08/11/2009    EF 64%;LV norm  . Nm myocar perf wall motion  10/22/2005    EF 67%  LV norm    reports that she has never smoked. She has never used smokeless tobacco. She reports that she does  not drink alcohol or use illicit drugs. family history includes Alcohol abuse in her brother; Alzheimer's disease in her mother; Colon polyps in her brother; Diabetes in her mother; Hyperlipidemia in her mother; Osteoarthritis in her brother and mother; Prostate cancer in her brother and father. No  Known Allergies  No change in PFS history since last office visit on 03/20/2013     Objective:   Physical Exam  Nursing note and vitals reviewed. Constitutional: She is oriented to person, place, and time. She appears well-developed and well-nourished. No distress.  HENT:  Head: Normocephalic and atraumatic.  Right Ear: External ear normal.  Left Ear: External ear normal.  Nose: Nose normal.  Mouth/Throat: No oropharyngeal exudate.  Oropharynx slightly erythematous with cobblestoning, no exudate present.  Right ear canal obstructed with ear wax.   Bilateral tympanic membranes are normal in appearance after irrigation of ear canal on the right.  Bilateral frontal and maxillary sinuses are nontender to palpation.  Eyes: Conjunctivae and EOM are normal. Pupils are equal, round, and reactive to light. Right eye exhibits no discharge. Left eye exhibits no discharge. No scleral icterus.  Neck: Normal range of motion. Neck supple. No JVD present. No tracheal deviation present. No thyromegaly present.  Cardiovascular: Normal rate, regular rhythm, normal heart sounds and intact distal pulses.  Exam reveals no gallop and no friction rub.   No murmur heard. Pulmonary/Chest: Effort normal and breath sounds normal. No stridor. No respiratory distress. She has no wheezes. She has no rales. She exhibits no tenderness.  Abdominal: Soft. Bowel sounds are normal. There is no tenderness.  Musculoskeletal: Normal range of motion. She exhibits no edema.  Lymphadenopathy:    She has no cervical adenopathy.  Neurological: She is alert and oriented to person, place, and time. She has normal reflexes. She displays normal reflexes. No cranial nerve deficit. She exhibits normal muscle tone. Coordination normal.  Sharp and dull sensation intact in bilateral lower extremities.  Monofilament exam plantar surface and dorsal surface of feet and toes normal.  Vibratory sensation normal bilaterally in lower  extremities  Skin: Skin is warm and dry. No rash noted. She is not diaphoretic. No erythema. No pallor.  Psychiatric: She has a normal mood and affect. Her behavior is normal. Judgment and thought content normal.   Filed Vitals:   07/22/13 1035  BP: 120/74  Pulse: 81  Temp: 97.4 F (36.3 C)  Resp: 16   Lab Results  Component Value Date   WBC 8.1 10/26/2011   HGB 13.7 10/26/2011   HCT 41.1 10/26/2011   PLT 187.0 10/26/2011   GLUCOSE 91 09/01/2012   CHOL 247* 09/01/2012   TRIG 183* 09/01/2012   HDL 47 09/01/2012   LDLDIRECT 172.2 08/07/2010   LDLCALC 163* 09/01/2012   ALT 14 09/01/2012   AST 16 09/01/2012   NA 140 09/01/2012   K 4.7 09/01/2012   CL 100 09/01/2012   CREATININE 1.14* 09/01/2012   BUN 26* 09/01/2012   CO2 28 09/01/2012   TSH 4.43 02/15/2012   INR 2.01* 07/06/2010      Assessment & Plan:  [name] was seen today for headache, cough and peripheral neuropathy.  Diagnoses and associated orders for this visit:  Allergic rhinitis  Burning sensation of the foot - Nerve conduction test; Future  Chronic cough - DG Chest 2 View; Future  Cerumen impaction - Ear Lavage    Patient Instructions  You will go to [place] today after this visit for a chest x-ray due to  your chronic cough with blood in your sputum.  You will be called to schedule and nerve conduction study and followup for results with your primary care provider.  Plain OTC Mucinex (NOT D) for thick secretions ;force NON dairy fluids .    OTC Flonase OR Nasacort AQ 1 spray in each nostril twice a day as needed. Use the "crossover" technique into opposite nostril spraying toward opposite ear @ 45 degree angle, not straight up into nostril.   Continue OTC Loratidine 10 mg as needed for itchy eyes & sneezing.  Followup is symptoms worsen or do not improve despite treatment.

## 2013-07-22 NOTE — Patient Instructions (Addendum)
You will go to [facility] today after this visit for a chest x-ray due to your chronic cough with blood in your sputum.  You will be called to schedule and nerve conduction study and followup for results with your primary care provider.  Plain OTC Mucinex (NOT D) for thick secretions ;force NON dairy fluids .    OTC Flonase OR Nasacort AQ 1 spray in each nostril twice a day as needed. Use the "crossover" technique into opposite nostril spraying toward opposite ear @ 45 degree angle, not straight up into nostril.   Continue OTC Loratidine 10 mg as needed for itchy eyes & sneezing.  Followup is symptoms worsen or do not improve despite treatment.  Allergic Rhinitis Allergic rhinitis is when the mucous membranes in the nose respond to allergens. Allergens are particles in the air that cause your body to have an allergic reaction. This causes you to release allergic antibodies. Through a chain of events, these eventually cause you to release histamine into the blood stream. Although meant to protect the body, it is this release of histamine that causes your discomfort, such as frequent sneezing, congestion, and an itchy, runny nose.  CAUSES  Seasonal allergic rhinitis (hay fever) is caused by pollen allergens that may come from grasses, trees, and weeds. Year-round allergic rhinitis (perennial allergic rhinitis) is caused by allergens such as house dust mites, pet dander, and mold spores.  SYMPTOMS   Nasal stuffiness (congestion).  Itchy, runny nose with sneezing and tearing of the eyes. DIAGNOSIS  Your health care provider can help you determine the allergen or allergens that trigger your symptoms. If you and your health care provider are unable to determine the allergen, skin or blood testing may be used. TREATMENT  Allergic Rhinitis does not have a cure, but it can be controlled by:  Medicines and allergy shots (immunotherapy).  Avoiding the allergen. Hay fever may often be treated with  antihistamines in pill or nasal spray forms. Antihistamines block the effects of histamine. There are over-the-counter medicines that may help with nasal congestion and swelling around the eyes. Check with your health care provider before taking or giving this medicine.  If avoiding the allergen or the medicine prescribed do not work, there are many new medicines your health care provider can prescribe. Stronger medicine may be used if initial measures are ineffective. Desensitizing injections can be used if medicine and avoidance does not work. Desensitization is when a patient is given ongoing shots until the body becomes less sensitive to the allergen. Make sure you follow up with your health care provider if problems continue. HOME CARE INSTRUCTIONS It is not possible to completely avoid allergens, but you can reduce your symptoms by taking steps to limit your exposure to them. It helps to know exactly what you are allergic to so that you can avoid your specific triggers. SEEK MEDICAL CARE IF:   You have a fever.  You develop a cough that does not stop easily (persistent).  You have shortness of breath.  You start wheezing.  Symptoms interfere with normal daily activities. Document Released: 12/12/2000 Document Revised: 01/07/2013 Document Reviewed: 11/24/2012 [facility] Patient Information 2014 [place]. Allergies  Allergies may happen from anything your body is sensitive to. This may be food, medicines, pollens, chemicals, and many other things. Food allergies can be severe and deadly.  HOME CARE  If you do not know what causes a reaction, keep a diary. Write down the foods you ate and the symptoms that followed.  Avoid foods that cause reactions.  If you have red raised spots (hives) or a rash:  Take medicine as told by your doctor.  Use medicines for red raised spots and itching as needed.  Apply cold cloths (compresses) to the skin. Take a cool bath. Avoid hot baths or  showers.  If you are severely allergic:  It is often necessary to go to the hospital after you have treated your reaction.  Wear your medical alert jewelry.  You and your family must learn how to give a allergy shot or use an allergy kit (anaphylaxis kit).  Always carry your allergy kit or shot with you. Use this medicine as told by your doctor if a severe reaction is occurring. GET HELP RIGHT AWAY IF:  You have trouble breathing or are making high-pitched whistling sounds (wheezing).  You have a tight feeling in your chest or throat.  You have a puffy (swollen) mouth.  You have red raised spots, puffiness (swelling), or itching all over your body.  You have had a severe reaction that was helped by your allergy kit or shot. The reaction can return once the medicine has worn off.  You think you are having a food allergy. Symptoms most often happen within 30 minutes of eating a food.  Your symptoms have not gone away within 2 days or are getting worse.  You have new symptoms.  You want to retest yourself with a food or drink you think causes an allergic reaction. Only do this under the care of a doctor. MAKE SURE YOU:   Understand these instructions.  Will watch your condition.  Will get help right away if you are not doing well or get worse. Document Released: 07/14/2012 Document Reviewed: 07/14/2012 [employer] Patient Information 2014 [place], [place].

## 2013-07-28 ENCOUNTER — Other Ambulatory Visit: Payer: Self-pay | Admitting: *Deleted

## 2013-07-28 DIAGNOSIS — R208 Other disturbances of skin sensation: Secondary | ICD-10-CM

## 2013-08-26 ENCOUNTER — Ambulatory Visit (INDEPENDENT_AMBULATORY_CARE_PROVIDER_SITE_OTHER): Payer: Medicare HMO | Admitting: Neurology

## 2013-08-26 ENCOUNTER — Encounter: Payer: Self-pay | Admitting: Neurology

## 2013-08-26 ENCOUNTER — Encounter (INDEPENDENT_AMBULATORY_CARE_PROVIDER_SITE_OTHER): Payer: Self-pay

## 2013-08-26 VITALS — BP 118/76 | HR 72 | Ht 63.0 in | Wt 197.0 lb

## 2013-08-26 DIAGNOSIS — R259 Unspecified abnormal involuntary movements: Secondary | ICD-10-CM

## 2013-08-26 DIAGNOSIS — F419 Anxiety disorder, unspecified: Secondary | ICD-10-CM

## 2013-08-26 DIAGNOSIS — R569 Unspecified convulsions: Secondary | ICD-10-CM | POA: Insufficient documentation

## 2013-08-26 DIAGNOSIS — F3289 Other specified depressive episodes: Secondary | ICD-10-CM

## 2013-08-26 DIAGNOSIS — E785 Hyperlipidemia, unspecified: Secondary | ICD-10-CM

## 2013-08-26 DIAGNOSIS — R251 Tremor, unspecified: Secondary | ICD-10-CM

## 2013-08-26 DIAGNOSIS — G43909 Migraine, unspecified, not intractable, without status migrainosus: Secondary | ICD-10-CM

## 2013-08-26 DIAGNOSIS — F329 Major depressive disorder, single episode, unspecified: Secondary | ICD-10-CM

## 2013-08-26 DIAGNOSIS — F411 Generalized anxiety disorder: Secondary | ICD-10-CM

## 2013-08-26 DIAGNOSIS — R51 Headache: Secondary | ICD-10-CM

## 2013-08-26 DIAGNOSIS — I10 Essential (primary) hypertension: Secondary | ICD-10-CM

## 2013-08-26 MED ORDER — BUTALBITAL-APAP-CAFFEINE 50-325-40 MG PO TABS: 1.0000 | ORAL_TABLET | Freq: Four times a day (QID) | ORAL | Status: DC | PRN

## 2013-08-26 MED ORDER — ZOLPIDEM TARTRATE 5 MG PO TABS: 5.0000 mg | ORAL_TABLET | Freq: Every evening | ORAL | Status: DC | PRN

## 2013-08-26 NOTE — Progress Notes (Signed)
PATIENT: Tressa Busman DOB: 10/11/1940  HISTORICAL  Kamille Toomey is a 73 years old right-handed African American female, unknown at today's clinical visit, her primary care physician is Dr. Asa Lente, last clinical visit with [place] was in September 2013, she drove here today without appointment, to be seen urgently because of she has more frequent severe headaches.  She had severe headache in May 26th 2015, vertex, light noise sensitive, lasting all night, sharp pain like lighting, flashing, she did not take any medications, Now her headache has much improved, but is still 5/10, she denies visual change, no lateralized motor or sensory deficit. She has not had headaches for a while, very scared, and bothered by her headaches,  She has been compliance with her medications,   She has past medical history of epilepsy, last seizure was many years ago, sounds like generalized tonic-clonic seizure, she is taking Depakote ER 500 mg one tablet twice a day, she also has past medical history of depression, BusPirone10 mg 3 times a day, nortriptyline 25 mg at bedtime. Hypertension, Hyzaar 50/12.5 mg once daily,  propanolol SR 120 mg once daily, hyperlipidemia, Zocor 20 mg every night, and valacyclovir as needed for genital herpes  She felt so tired for 2 years, she has not been sleeping well, she could not fall into sleep at night, she felt sleepy during the day.  She usually take 1-2 hours nap during the day at 1-2 pm.   She also complains of 2 years history of low back pain, radiating pain to her right hip, right leg, mild gait difficulty  She is tearful during today's interview, complains of worsening depression, her daughter died of brain tumor in Mar 09, 2013, with her increased frequency of her headaches, she worries about the possibility of central nervous system space-occupying lesions, desires further evaluations  REVIEW OF SYSTEMS: Full 14 system review of systems performed and  notable only for headaches, ringing in ears, runny nose, eye discharge, insomnia, headaches, tremor, fatigue, gait difficulty  ALLERGIES: No Known Allergies  HOME MEDICATIONS: Current Outpatient Prescriptions on File Prior to Visit  Medication Sig Dispense Refill  . busPIRone (BUSPAR) 10 MG tablet Take 1 tablet (10 mg total) by mouth 3 (three) times daily.  90 tablet  1  . Calcium Carb-Cholecalciferol (CALCIUM 1000 + D PO) Take 1 tablet by mouth 3 (three) times daily.      . cyclobenzaprine (FLEXERIL) 5 MG tablet Take 1 tablet (5 mg total) by mouth at bedtime. X 1 week, then as needed for muscle spasms  30 tablet  0  . divalproex (DEPAKOTE ER) 500 MG 24 hr tablet take 1 tablet by mouth twice a day  60 tablet  3  . loratadine (CLARITIN) 10 MG tablet Take 1 tablet (10 mg total) by mouth daily as needed for allergies.  30 tablet  2  . losartan-hydrochlorothiazide (HYZAAR) 50-12.5 MG per tablet Take 1 tablet by mouth daily.      . mometasone-formoterol (DULERA) 100-5 MCG/ACT AERO Inhale 2 puffs into the lungs 2 (two) times daily.      . nortriptyline (PAMELOR) 25 MG capsule Take 1 capsule (25 mg total) by mouth at bedtime.  30 capsule  5  . omeprazole (PRILOSEC) 40 MG capsule take 1 capsule by mouth twice a day  60 capsule  2  . PROAIR HFA 108 (90 BASE) MCG/ACT inhaler inhale 2 puffs by mouth every 6 hours AS NEEDED FOR WHEEZING  8.5 g  1  . propranolol ER (  INDERAL LA) 120 MG 24 hr capsule take 1 capsule by mouth once daily  30 capsule  5  . simvastatin (ZOCOR) 20 MG tablet take 1 tablet by mouth every evening  30 tablet  5  . valACYclovir (VALTREX) 1000 MG tablet Take 1,000 mg by mouth daily as needed (for outbreaks).        No current facility-administered medications on file prior to visit.    PAST MEDICAL HISTORY: Past Medical History  Diagnosis Date  . INSOMNIA-SLEEP DISORDER-UNSPEC   . KNEE PAIN, BILATERAL   . OSTEOPENIA   . LEG EDEMA   . Headache(784.0)   . RHINITIS, ALLERGIC   .  GRAVES' DISEASE   . CONVULSIONS, SEIZURES, NOS   . HYPERLIPIDEMIA   . GASTROESOPHAGEAL REFLUX, NO ESOPHAGITIS   . DEPRESSION   . OSTEOARTHRITIS, LOWER LEG     R TKR 07/2010  . Fibromyalgia   . Hypertension   . Tremor   . Hyperlipemia   . Anxiety   . Seizure   . Migraine     PAST SURGICAL HISTORY: Past Surgical History  Procedure Laterality Date  . Tubal ligation  04/28/03  . Right knee arthroscopy  2006  . Total knee arthroplasty  07/06/2010    right TKR - rowan  . Doppler echocardiography  06/21/2011    EF=55%; LV norm and systolic function and mild finding of diastolic  . Sleep study  07/21/2011    mild obstructive sleep apnea & upper airway resistnce syndrome did not justify with CPAP.  Marland Kitchen Eusebio Me doppplers  03/02/2010    no evidence of DVTno comment on prescence or absence of perip. venous insuff.  . Nm myocar perf wall motion  08/11/2009    EF 64%;LV norm  . Nm myocar perf wall motion  10/22/2005    EF 67%  LV norm    FAMILY HISTORY: Family History  Problem Relation Age of Onset  . Diabetes Mother   . Osteoarthritis Mother   . Hyperlipidemia Mother   . Alzheimer's disease Mother   . Prostate cancer Father   . Osteoarthritis Brother   . Colon polyps Brother   . Prostate cancer Brother   . Alcohol abuse Brother     SOCIAL HISTORY:  History   Social History  . Marital Status: Divorced    Spouse Name: N/A    Number of Children: 2  . Years of Education: 59   Occupational History    Retired/Disabled,    Social History Main Topics  . Smoking status: Never Smoker   . Smokeless tobacco: Never Used  . Alcohol Use: No  . Drug Use: No  . Sexual Activity: Not on file   Other Topics Concern  . Not on file   Social History Narrative   Patient lives at home alone. Patient is retired/Disabled.   Education two years of college.   Right handed.   Caffeine - None     PHYSICAL EXAM   Filed Vitals:   08/26/13 1114  BP: 118/76  Pulse: 72  Height: $Remove'5\' 3"'RCcKHQQ$  (1.6  m)  Weight: 197 lb (89.359 kg)    Not recorded    Body mass index is 34.91 kg/(m^2).   Generalized: In no acute distress  Neck: Supple, no carotid bruits   Cardiac: Regular rate rhythm  Pulmonary: Clear to auscultation bilaterally  Musculoskeletal: No deformity  Neurological examination  Mentation: Alert oriented to time, place, history taking, and causual conversation, depressed looking elderly female, tearful,  Cranial nerve II-XII:  Pupils were equal round reactive to light. Extraocular movements were full.  Visual field were full on confrontational test. Bilateral fundi were sharp.  Facial sensation and strength were normal. Hearing was intact to finger rubbing bilaterally. Uvula tongue midline.  Head turning and shoulder shrug and were normal and symmetric.Tongue protrusion into cheek strength was normal.  Motor: Normal tone, bulk and strength.  Sensory: Intact to fine touch, pinprick, preserved vibratory sensation, and proprioception at toes.  Coordination: Normal finger to nose, heel-to-shin bilaterally there was no truncal ataxia  Gait: Rising up from seated position by pushing up, dragging her right leg, difficulty with tiptoe and heels.  Romberg signs: Negative  Deep tendon reflexes: Brachioradialis 2/2, biceps 2/2, triceps 2/2, patellar 1/1, Achilles absent, plantar responses were flexor bilaterally.   DIAGNOSTIC DATA (LABS, IMAGING, TESTING) - I reviewed patient records, labs, notes, testing and imaging myself where available.  Lab Results  Component Value Date   WBC 8.1 10/26/2011   HGB 13.7 10/26/2011   HCT 41.1 10/26/2011   MCV 91.1 10/26/2011   PLT 187.0 10/26/2011      Component Value Date/Time   NA 140 09/01/2012 1230   K 4.7 09/01/2012 1230   CL 100 09/01/2012 1230   CO2 28 09/01/2012 1230   GLUCOSE 91 09/01/2012 1230   BUN 26* 09/01/2012 1230   CREATININE 1.14* 09/01/2012 1230   CREATININE 1.2 10/26/2011 1531   CALCIUM 9.7 09/01/2012 1230   PROT 7.5 09/01/2012  1230   ALBUMIN 3.8 09/01/2012 1230   AST 16 09/01/2012 1230   ALT 14 09/01/2012 1230   ALKPHOS 71 09/01/2012 1230   BILITOT 0.4 09/01/2012 1230   GFRNONAA >60 07/04/2010 0520   GFRAA  Value: >60        The eGFR has been calculated using the MDRD equation. This calculation has not been validated in all clinical situations. eGFR's persistently <60 mL/min signify possible Chronic Kidney Disease. 07/04/2010 0520   Lab Results  Component Value Date   CHOL 247* 09/01/2012   HDL 47 09/01/2012   LDLCALC 163* 09/01/2012   LDLDIRECT 172.2 08/07/2010   TRIG 183* 09/01/2012   CHOLHDL 5.3 09/01/2012   Lab Results  Component Value Date   TSH 4.43 02/15/2012    ASSESSMENT AND PLAN  Jerrianne Hartin is a 73 y.o. female complains of  worsening fatigue, low back pain, radiating pain to her right hip, worsening gait difficulty, new onset severe headaches, worsening depression, history of seizure, is taking Depakote 500 mg daily  1, will repeat MRI of brain, with without contrast 2. Her complains of low back pain, radiating pain to her right leg, gait difficulty, indicating a right lumbar radiculopathy, proceed with MRI of lumbar, physical therapy, 3. Fioricet as needed for headaches,   [name], M.D. Ph.D.  [facility] [address] [place], [place] [address] [phone]

## 2013-08-28 ENCOUNTER — Telehealth: Payer: Self-pay | Admitting: *Deleted

## 2013-08-28 NOTE — Telephone Encounter (Signed)
Open in error

## 2013-09-08 ENCOUNTER — Ambulatory Visit
Admission: RE | Admit: 2013-09-08 | Discharge: 2013-09-08 | Disposition: A | Discharge status: Home / Self Care | Payer: Commercial Managed Care - HMO | Source: Ambulatory Visit | Attending: Neurology | Admitting: Neurology

## 2013-09-08 DIAGNOSIS — R569 Unspecified convulsions: Secondary | ICD-10-CM

## 2013-09-08 DIAGNOSIS — I10 Essential (primary) hypertension: Secondary | ICD-10-CM

## 2013-09-08 DIAGNOSIS — E785 Hyperlipidemia, unspecified: Secondary | ICD-10-CM

## 2013-09-08 DIAGNOSIS — R251 Tremor, unspecified: Secondary | ICD-10-CM

## 2013-09-08 DIAGNOSIS — F419 Anxiety disorder, unspecified: Secondary | ICD-10-CM

## 2013-09-08 DIAGNOSIS — G43909 Migraine, unspecified, not intractable, without status migrainosus: Secondary | ICD-10-CM

## 2013-09-08 DIAGNOSIS — M545 Low back pain, unspecified: Secondary | ICD-10-CM

## 2013-09-08 DIAGNOSIS — F3289 Other specified depressive episodes: Secondary | ICD-10-CM

## 2013-09-08 DIAGNOSIS — R51 Headache: Secondary | ICD-10-CM

## 2013-09-08 DIAGNOSIS — F329 Major depressive disorder, single episode, unspecified: Secondary | ICD-10-CM

## 2013-09-08 DIAGNOSIS — R259 Unspecified abnormal involuntary movements: Secondary | ICD-10-CM

## 2013-09-08 MED ORDER — GADOBENATE DIMEGLUMINE 529 MG/ML IV SOLN
19.0000 mL | Freq: Once | INTRAVENOUS | Status: AC | PRN
  Administered 2013-09-08: 19 mL via INTRAVENOUS

## 2013-09-09 ENCOUNTER — Other Ambulatory Visit: Payer: Self-pay | Admitting: Pulmonary Disease

## 2013-09-10 ENCOUNTER — Telehealth: Payer: Self-pay | Admitting: *Deleted

## 2013-09-10 ENCOUNTER — Telehealth: Payer: Self-pay | Admitting: Neurology

## 2013-09-10 DIAGNOSIS — F329 Major depressive disorder, single episode, unspecified: Secondary | ICD-10-CM

## 2013-09-10 DIAGNOSIS — E785 Hyperlipidemia, unspecified: Secondary | ICD-10-CM

## 2013-09-10 DIAGNOSIS — I10 Essential (primary) hypertension: Secondary | ICD-10-CM

## 2013-09-10 DIAGNOSIS — F3289 Other specified depressive episodes: Secondary | ICD-10-CM

## 2013-09-10 NOTE — Telephone Encounter (Signed)
[name] Please call patient MRI of the brain showed age related changes,  MRI of lumbar spine showed severe spinal stenosis at L4-5 level  I have ordered EMG nerve conduction study, please give her appointment, I will review the film during her visit,  Mildly abnormal MRI brain (with and without) demonstrating:  1. There is mild scattered periventricular and subcortical chronic small vessel ischemic disease.  2. No acute findings. No mass lesions. No abnormal enhancing lesions.  IMPRESSION:  Abnormal MRI lumbar spine (without) demonstrating:  1. At L4-5: disc bulging, facet and ligamentum flavum hypertrophy with severe spinal stenosis and mild-moderate biforaminal stenosis  2. At L5-S1: disc bulging, facet and ligamentum flavum hypertrophy with mild-moderate biforaminal stenosis  3. At L1-2, L2-3, L3-4: disc bulging, facet hypertrophy, short pedicles, with mild biforaminal stenosis  4.

## 2013-09-10 NOTE — Telephone Encounter (Signed)
omeperazole 40 mg 1 po BID requires PA [phone] ID # [id number] I called spoke with Raquel Sarna a PA rep for [employer]. Was told this will need to go to clinical review. Ref # [id number] Will forward to [place] to f/u on

## 2013-09-11 ENCOUNTER — Other Ambulatory Visit: Payer: Self-pay | Admitting: *Deleted

## 2013-09-11 MED ORDER — LOSARTAN POTASSIUM-HCTZ 50-12.5 MG PO TABS: 1.0000 | ORAL_TABLET | Freq: Every day | ORAL | Status: DC

## 2013-09-14 NOTE — Telephone Encounter (Signed)
Spoke to patient and relayed MRI brain and lumbar results, per Dr. Krista Blue.  She is scheduled for an EMG with [place] but will cancel that appointment and will get her scheduled at [facility], since she is being followed by Dr. Krista Blue.   Her follow up is already scheduled for 09-28-13.

## 2013-09-15 ENCOUNTER — Telehealth: Payer: Self-pay | Admitting: Neurology

## 2013-09-15 NOTE — Telephone Encounter (Signed)
Spoke to patient to relay her MRI brain and lumbar results, and she said that she is experiencing numbness and tingling in her feet.  It is intermittent and varies in intensity.  She forgot to mention it to the doctor when she was at her appointment and just wanted her to be aware.  She has her NCV/EMG on 09-21-13 and follow up appt on 09-28-13.

## 2013-09-16 NOTE — Telephone Encounter (Signed)
Omeprazole denied. [id number] to advise the pt that she needs to get the meds OTC. Edwards Bing, CMA

## 2013-09-17 NOTE — Telephone Encounter (Signed)
Called home # and received message it was no longer in service. Called mobile #. Spoke with pt. She is aware to get oTC. Nothing further needed

## 2013-09-21 ENCOUNTER — Encounter: Payer: Commercial Managed Care - HMO | Admitting: Neurology

## 2013-09-22 ENCOUNTER — Ambulatory Visit: Payer: PRIVATE HEALTH INSURANCE | Admitting: Pulmonary Disease

## 2013-09-23 ENCOUNTER — Encounter: Payer: Self-pay | Admitting: Neurology

## 2013-09-23 ENCOUNTER — Encounter: Payer: Self-pay | Admitting: Pulmonary Disease

## 2013-09-28 ENCOUNTER — Encounter (INDEPENDENT_AMBULATORY_CARE_PROVIDER_SITE_OTHER): Payer: Self-pay

## 2013-09-28 ENCOUNTER — Encounter: Payer: Self-pay | Admitting: Neurology

## 2013-09-28 ENCOUNTER — Ambulatory Visit (INDEPENDENT_AMBULATORY_CARE_PROVIDER_SITE_OTHER): Payer: Medicare HMO | Admitting: Neurology

## 2013-09-28 VITALS — BP 133/77 | HR 89 | Ht 63.0 in | Wt 200.0 lb

## 2013-09-28 DIAGNOSIS — F411 Generalized anxiety disorder: Secondary | ICD-10-CM

## 2013-09-28 DIAGNOSIS — M5416 Radiculopathy, lumbar region: Secondary | ICD-10-CM

## 2013-09-28 DIAGNOSIS — IMO0002 Reserved for concepts with insufficient information to code with codable children: Secondary | ICD-10-CM

## 2013-09-28 DIAGNOSIS — F419 Anxiety disorder, unspecified: Secondary | ICD-10-CM

## 2013-09-28 DIAGNOSIS — R569 Unspecified convulsions: Secondary | ICD-10-CM

## 2013-09-28 MED ORDER — BUTALBITAL-APAP-CAFFEINE 50-325-40 MG PO TABS: 1.0000 | ORAL_TABLET | Freq: Four times a day (QID) | ORAL | Status: DC | PRN

## 2013-09-28 NOTE — Progress Notes (Signed)
PATIENT: [name] DOB: 1940-12-24  HISTORICAL  [name] is a 73 years old right-handed African American female, unknown at today's clinical visit, her primary care physician is Dr. Asa Lente, last clinical visit with [place] was in September 2013, she drove here today without appointment, to be seen urgently because of she has more frequent severe headaches.  She had severe headache in May 26th 2015, vertex, light noise sensitive, lasting all night, sharp pain like lighting, flashing, she did not take any medications, Now her headache has much improved, but is still 5/10, she denies visual change, no lateralized motor or sensory deficit. She has not had headaches for a while, very scared, and bothered by her headaches,  She has been compliance with her medications,   She has past medical history of epilepsy, last seizure was many years ago, sounds like generalized tonic-clonic seizure, she is taking Depakote ER 500 mg one tablet twice a day, she also has past medical history of depression, BusPirone10 mg 3 times a day, nortriptyline 25 mg at bedtime. Hypertension, Hyzaar 50/12.5 mg once daily,  propanolol SR 120 mg once daily, hyperlipidemia, Zocor 20 mg every night, and valacyclovir as needed for genital herpes  She felt so tired for 2 years, she has not been sleeping well, she could not fall into sleep at night, she felt sleepy during the day.  She usually take 1-2 hours nap during the day at 1-2 pm.   She also complains of 2 years history of low back pain, radiating pain to her right hip, right leg, mild gait difficulty  She is tearful during today's interview, complains of worsening depression, her daughter died of brain tumor in 03-08-2013, with her increased frequency of her headaches, she worries about the possibility of central nervous system space-occupying lesions, desires further evaluations  UPDATE June 29th 2015: She is taking Fioricet as needed, 2/ weeks,  works well for her. She complains of low back pain, radiating pain to her bilateral lower extremity, mild gait difficulty, but no bowel bladder incontinence.  We have reviewed MRI brain which showed mild scattered periventricular and subcortical chronic small vessel ischemic disease. No acute findings. No mass lesions.    MRI lumbar spine (without) demonstrating:  1. At L4-5: disc bulging, facet and ligamentum flavum hypertrophy with severe spinal stenosis and mild-moderate biforaminal stenosis  2. At L5-S1: disc bulging, facet and ligamentum flavum hypertrophy with mild-moderate biforaminal stenosis  3. At L1-2, L2-3, L3-4: disc bulging, facet hypertrophy, short pedicles, with mild biforaminal stenosis     REVIEW OF SYSTEMS: Full 14 system review of systems performed and notable only for cough, leg swelling, achy muscles, muscle cramps, dizziness, seizure, tremor  ALLERGIES: No Known Allergies  HOME MEDICATIONS: Current Outpatient Prescriptions on File Prior to Visit  Medication Sig Dispense Refill  . busPIRone (BUSPAR) 10 MG tablet Take 1 tablet (10 mg total) by mouth 3 (three) times daily.  90 tablet  1  . butalbital-acetaminophen-caffeine (FIORICET, ESGIC) 50-325-40 MG per tablet Take 1 tablet by mouth every 6 (six) hours as needed for headache.  30 tablet  3  . Calcium Carb-Cholecalciferol (CALCIUM 1000 + D PO) Take 1 tablet by mouth 3 (three) times daily.      . cyclobenzaprine (FLEXERIL) 5 MG tablet Take 1 tablet (5 mg total) by mouth at bedtime. X 1 week, then as needed for muscle spasms  30 tablet  0  . divalproex (DEPAKOTE ER) 500 MG 24 hr tablet take 1 tablet  by mouth twice a day  60 tablet  3  . loratadine (CLARITIN) 10 MG tablet Take 1 tablet (10 mg total) by mouth daily as needed for allergies.  30 tablet  2  . losartan-hydrochlorothiazide (HYZAAR) 50-12.5 MG per tablet Take 1 tablet by mouth daily. Need appointment before further refill  30 tablet  0  . mometasone-formoterol  (DULERA) 100-5 MCG/ACT AERO Inhale 2 puffs into the lungs 2 (two) times daily.      . nortriptyline (PAMELOR) 25 MG capsule Take 1 capsule (25 mg total) by mouth at bedtime.  30 capsule  5  . omeprazole (PRILOSEC) 40 MG capsule take 1 capsule by mouth twice a day  60 capsule  2  . PROAIR HFA 108 (90 BASE) MCG/ACT inhaler inhale 2 puffs by mouth every 6 hours AS NEEDED FOR WHEEZING  8.5 g  1  . propranolol ER (INDERAL LA) 120 MG 24 hr capsule take 1 capsule by mouth once daily  30 capsule  5  . simvastatin (ZOCOR) 20 MG tablet take 1 tablet by mouth every evening  30 tablet  5  . valACYclovir (VALTREX) 1000 MG tablet Take 1,000 mg by mouth daily as needed (for outbreaks).       . zolpidem (AMBIEN) 5 MG tablet Take 1 tablet (5 mg total) by mouth at bedtime as needed for sleep.  30 tablet  5   No current facility-administered medications on file prior to visit.    PAST MEDICAL HISTORY: Past Medical History  Diagnosis Date  . INSOMNIA-SLEEP DISORDER-UNSPEC   . KNEE PAIN, BILATERAL   . OSTEOPENIA   . LEG EDEMA   . Headache(784.0)   . RHINITIS, ALLERGIC   . GRAVES' DISEASE   . CONVULSIONS, SEIZURES, NOS   . HYPERLIPIDEMIA   . GASTROESOPHAGEAL REFLUX, NO ESOPHAGITIS   . DEPRESSION   . OSTEOARTHRITIS, LOWER LEG     R TKR 07/2010  . Fibromyalgia   . Hypertension   . Tremor   . Hyperlipemia   . Anxiety   . Seizure   . Migraine   . Gait abnormality   . Lumbar radiculopathy     PAST SURGICAL HISTORY: Past Surgical History  Procedure Laterality Date  . Tubal ligation  04/28/03  . Right knee arthroscopy  2006  . Total knee arthroplasty  07/06/2010    right TKR - rowan  . Doppler echocardiography  06/21/2011    EF=55%; LV norm and systolic function and mild finding of diastolic  . Sleep study  07/21/2011    mild obstructive sleep apnea & upper airway resistnce syndrome did not justify with CPAP.  Marland Kitchen Felicity Coyer doppplers  03/02/2010    no evidence of DVTno comment on prescence or absence of  perip. venous insuff.  . Nm myocar perf wall motion  08/11/2009    EF 64%;LV norm  . Nm myocar perf wall motion  10/22/2005    EF 67%  LV norm    FAMILY HISTORY: Family History  Problem Relation Age of Onset  . Diabetes Mother   . Osteoarthritis Mother   . Hyperlipidemia Mother   . Alzheimer's disease Mother   . Prostate cancer Father   . Osteoarthritis Brother   . Colon polyps Brother   . Prostate cancer Brother   . Alcohol abuse Brother     SOCIAL HISTORY:  History   Social History  . Marital Status: Divorced    Spouse Name: N/A    Number of Children: 2  . Years  of Education: 14   Occupational History    Retired/Disabled,    Social History Main Topics  . Smoking status: Never Smoker   . Smokeless tobacco: Never Used  . Alcohol Use: No  . Drug Use: No  . Sexual Activity: Not on file   Other Topics Concern  . Not on file   Social History Narrative   Patient lives at home alone. Patient is retired/Disabled.   Education two years of college.   Right handed.   Caffeine - None     PHYSICAL EXAM   Filed Vitals:   09/28/13 1214  BP: 133/77  Pulse: 89  Height: $Remove'5\' 3"'NZXjkUl$  (1.6 m)  Weight: 200 lb (90.719 kg)    Not recorded    Body mass index is 35.44 kg/(m^2).   Generalized: In no acute distress  Neck: Supple, no carotid bruits   Cardiac: Regular rate rhythm  Pulmonary: Clear to auscultation bilaterally  Musculoskeletal: No deformity  Neurological examination  Mentation: Alert oriented to time, place, history taking, and causual conversation, depressed looking elderly female, tearful,  Cranial nerve II-XII: Pupils were equal round reactive to light. Extraocular movements were full.  Visual field were full on confrontational test. Bilateral fundi were sharp.  Facial sensation and strength were normal. Hearing was intact to finger rubbing bilaterally. Uvula tongue midline.  Head turning and shoulder shrug and were normal and symmetric.Tongue  protrusion into cheek strength was normal.  Motor: Normal tone, bulk and strength.  Sensory: Intact to fine touch, pinprick, preserved vibratory sensation, and proprioception at toes.  Coordination: Normal finger to nose, heel-to-shin bilaterally there was no truncal ataxia  Gait: Rising up from seated position by pushing up, dragging her right leg, difficulty with tiptoe and heels.  Romberg signs: Negative  Deep tendon reflexes: Brachioradialis 2/2, biceps 2/2, triceps 2/2, patellar 1/1, Achilles absent, plantar responses were flexor bilaterally.   DIAGNOSTIC DATA (LABS, IMAGING, TESTING) - I reviewed patient records, labs, notes, testing and imaging myself where available.  Lab Results  Component Value Date   WBC 8.1 10/26/2011   HGB 13.7 10/26/2011   HCT 41.1 10/26/2011   MCV 91.1 10/26/2011   PLT 187.0 10/26/2011      Component Value Date/Time   NA 140 09/01/2012 1230   K 4.7 09/01/2012 1230   CL 100 09/01/2012 1230   CO2 28 09/01/2012 1230   GLUCOSE 91 09/01/2012 1230   BUN 26* 09/01/2012 1230   CREATININE 1.14* 09/01/2012 1230   CREATININE 1.2 10/26/2011 1531   CALCIUM 9.7 09/01/2012 1230   PROT 7.5 09/01/2012 1230   ALBUMIN 3.8 09/01/2012 1230   AST 16 09/01/2012 1230   ALT 14 09/01/2012 1230   ALKPHOS 71 09/01/2012 1230   BILITOT 0.4 09/01/2012 1230   GFRNONAA >60 07/04/2010 0520   GFRAA  Value: >60        The eGFR has been calculated using the MDRD equation. This calculation has not been validated in all clinical situations. eGFR's persistently <60 mL/min signify possible Chronic Kidney Disease. 07/04/2010 0520   Lab Results  Component Value Date   CHOL 247* 09/01/2012   HDL 47 09/01/2012   LDLCALC 163* 09/01/2012   LDLDIRECT 172.2 08/07/2010   TRIG 183* 09/01/2012   CHOLHDL 5.3 09/01/2012   Lab Results  Component Value Date   TSH 4.43 02/15/2012    ASSESSMENT AND PLAN  [name] is a 73 y.o. female complains of  worsening fatigue, low back pain, radiating pain to her right hip,  worsening gait difficulty, new onset severe headaches, worsening depression, history of seizure, is taking Depakote 500 mg daily, She has evidence of lumbar radiculopathy, which is confirmed by MRI of lumbar, severe canal stenosis at L4-5,  She does not want to consider surgery at this point, she is to continue take Fioricet as needed for tension headaches, which has been very helpful her, limited to less than twice a week, return to clinic in one year,    [name], M.D. Ph.D.  [facility] [address] [place], [place] [address] [phone]

## 2013-10-06 ENCOUNTER — Ambulatory Visit: Payer: Commercial Managed Care - HMO | Admitting: Cardiovascular Disease

## 2013-10-11 ENCOUNTER — Telehealth: Payer: Self-pay

## 2013-10-11 NOTE — Telephone Encounter (Signed)
[employer] sent [place] a letter saying they have approved our request for coverage on Butalbital effective until 04/01/2014 Ref ID # [id number]

## 2013-11-03 ENCOUNTER — Other Ambulatory Visit: Payer: Self-pay | Admitting: Internal Medicine

## 2013-11-11 ENCOUNTER — Other Ambulatory Visit: Payer: Self-pay | Admitting: Cardiovascular Disease

## 2013-11-11 NOTE — Telephone Encounter (Signed)
Rx refill sent to patient pharmacy   

## 2013-11-13 ENCOUNTER — Ambulatory Visit: Payer: Self-pay | Admitting: Neurology

## 2013-11-20 ENCOUNTER — Ambulatory Visit: Payer: Commercial Managed Care - HMO | Admitting: Cardiovascular Disease

## 2013-11-24 ENCOUNTER — Other Ambulatory Visit: Payer: Self-pay | Admitting: Internal Medicine

## 2013-12-08 ENCOUNTER — Other Ambulatory Visit: Payer: Self-pay | Admitting: Neurology

## 2013-12-08 ENCOUNTER — Other Ambulatory Visit: Payer: Self-pay | Admitting: Cardiovascular Disease

## 2013-12-10 ENCOUNTER — Ambulatory Visit: Payer: Commercial Managed Care - HMO | Admitting: Cardiovascular Disease

## 2013-12-11 NOTE — Telephone Encounter (Signed)
Rx refill sent to patient pharmacy with note that appointment must be kept on 01/14/14

## 2014-01-14 ENCOUNTER — Encounter: Payer: Self-pay | Admitting: Cardiovascular Disease

## 2014-01-14 ENCOUNTER — Ambulatory Visit (INDEPENDENT_AMBULATORY_CARE_PROVIDER_SITE_OTHER): Payer: Medicare HMO | Admitting: Cardiovascular Disease

## 2014-01-14 VITALS — BP 132/76 | HR 98 | Resp 16 | Ht 63.0 in | Wt 193.5 lb

## 2014-01-14 DIAGNOSIS — E785 Hyperlipidemia, unspecified: Secondary | ICD-10-CM

## 2014-01-14 DIAGNOSIS — Z79899 Other long term (current) drug therapy: Secondary | ICD-10-CM

## 2014-01-14 DIAGNOSIS — E782 Mixed hyperlipidemia: Secondary | ICD-10-CM

## 2014-01-14 DIAGNOSIS — I10 Essential (primary) hypertension: Secondary | ICD-10-CM

## 2014-01-14 LAB — COMPREHENSIVE METABOLIC PANEL
ALBUMIN: 3.7 g/dL (ref 3.5–5.2)
ALT: 15 U/L (ref 0–35)
AST: 18 U/L (ref 0–37)
Alkaline Phosphatase: 63 U/L (ref 39–117)
BUN: 17 mg/dL (ref 6–23)
CALCIUM: 9.2 mg/dL (ref 8.4–10.5)
CHLORIDE: 104 meq/L (ref 96–112)
CO2: 24 mEq/L (ref 19–32)
Creat: 1.09 mg/dL (ref 0.50–1.10)
Glucose, Bld: 108 mg/dL — ABNORMAL HIGH (ref 70–99)
POTASSIUM: 3.6 meq/L (ref 3.5–5.3)
Sodium: 139 mEq/L (ref 135–145)
Total Bilirubin: 0.4 mg/dL (ref 0.2–1.2)
Total Protein: 6.8 g/dL (ref 6.0–8.3)

## 2014-01-14 LAB — LIPID PANEL
Cholesterol: 171 mg/dL (ref 0–200)
HDL: 40 mg/dL (ref >39)
LDL Cholesterol: 96 mg/dL (ref 0–99)
Total CHOL/HDL Ratio: 4.3 Ratio
Triglycerides: 176 mg/dL — ABNORMAL HIGH (ref <150)
VLDL: 35 mg/dL (ref 0–40)

## 2014-01-14 NOTE — Patient Instructions (Signed)
Your physician recommends that you return for lab work in: [place] [facility] AT [facility] PAPERWORK WITH YOU - YOU DO NOT NEED AN APPOINTMENT AT THE LAB.  Dr. Sallyanne Kuster recommends that you schedule a follow-up appointment in: ONE YEAR.

## 2014-01-14 NOTE — Progress Notes (Signed)
Patient ID: [name], female   DOB: 05/24/1940, 73 y.o.   MRN: [id number]     Reason for office visit Systemic hypertension, hyperlipidemia  There have been no major changes in [name]'s cardiovascular health since her last appointment. She is troubled mostly by neuropathic "burning" in both feet and joint problems. She denies issues of shortness of breath, angina, palpitations or syncope. She has not had other focal neurological complaints. She complains of some swelling in her legs.  Her daughter passed away last 02/24/2023 and the patient is still clearly grieving.  Her neurologist has advised that she have nerve conduction studies of the lower extremities. She is known to have severe spinal stenosis at the L4-L5 level. She does not want to have surgery.  She has treated hypertension and hyperlipidemia, but very little in the way of structural heart disease. She had mildly dilated left atrium by echocardiography. A nuclear stress test in 2011 was normal. Left ventricular systolic function is normal with an EF estimated at about 64%. She has had chronic complaints of lower extremity edema. Spirometry performed in June of last year showed normal results. A sleep study has been performed and showed upper airway resistance syndrome but very mild sleep apnea    No Known Allergies  Current Outpatient Prescriptions  Medication Sig Dispense Refill  . busPIRone (BUSPAR) 10 MG tablet Take 1 tablet (10 mg total) by mouth 3 (three) times daily.  90 tablet  1  . butalbital-acetaminophen-caffeine (FIORICET, ESGIC) 50-325-40 MG per tablet Take 1 tablet by mouth every 6 (six) hours as needed for headache.  30 tablet  5  . Calcium Carb-Cholecalciferol (CALCIUM 1000 + D PO) Take 1 tablet by mouth 3 (three) times daily.      . cyclobenzaprine (FLEXERIL) 5 MG tablet Take 1 tablet (5 mg total) by mouth at bedtime. X 1 week, then as needed for muscle spasms  30 tablet  0  . divalproex (DEPAKOTE  ER) 500 MG 24 hr tablet take 1 tablet by mouth twice a day  60 tablet  6  . FLUZONE HIGH-DOSE 0.5 ML SUSY       . loratadine (CLARITIN) 10 MG tablet Take 1 tablet (10 mg total) by mouth daily as needed for allergies.  30 tablet  2  . losartan-hydrochlorothiazide (HYZAAR) 50-12.5 MG per tablet Take 1 tablet by mouth daily. *MUST KEEP APPOINTMENT ON 01/14/14*  30 tablet  0  . mometasone-formoterol (DULERA) 100-5 MCG/ACT AERO Inhale 2 puffs into the lungs 2 (two) times daily.      . nortriptyline (PAMELOR) 25 MG capsule take 1 capsule by mouth at bedtime  30 capsule  3  . omeprazole (PRILOSEC) 40 MG capsule take 1 capsule by mouth twice a day  60 capsule  2  . PROAIR HFA 108 (90 BASE) MCG/ACT inhaler inhale 2 puffs by mouth every 6 hours AS NEEDED FOR WHEEZING  8.5 g  1  . propranolol ER (INDERAL LA) 120 MG 24 hr capsule take 1 capsule by mouth once daily  30 capsule  5  . simvastatin (ZOCOR) 20 MG tablet TAKE 1 TABLET BY MOUTH EVERY EVENING  30 tablet  5  . valACYclovir (VALTREX) 1000 MG tablet Take 1,000 mg by mouth daily as needed (for outbreaks).       . zolpidem (AMBIEN) 5 MG tablet Take 1 tablet (5 mg total) by mouth at bedtime as needed for sleep.  30 tablet  5   No current facility-administered medications for this  visit.    Past Medical History  Diagnosis Date  . INSOMNIA-SLEEP DISORDER-UNSPEC   . KNEE PAIN, BILATERAL   . OSTEOPENIA   . LEG EDEMA   . Headache(784.0)   . RHINITIS, ALLERGIC   . GRAVES' DISEASE   . CONVULSIONS, SEIZURES, NOS   . HYPERLIPIDEMIA   . GASTROESOPHAGEAL REFLUX, NO ESOPHAGITIS   . DEPRESSION   . OSTEOARTHRITIS, LOWER LEG     R TKR 07/2010  . Fibromyalgia   . Hypertension   . Tremor   . Hyperlipemia   . Anxiety   . Seizure   . Migraine   . Gait abnormality   . Lumbar radiculopathy     Past Surgical History  Procedure Laterality Date  . Tubal ligation  04/28/03  . Right knee arthroscopy  2006  . Total knee arthroplasty  07/06/2010    right TKR -  rowan  . Doppler echocardiography  06/21/2011    EF=55%; LV norm and systolic function and mild finding of diastolic  . Sleep study  07/21/2011    mild obstructive sleep apnea & upper airway resistnce syndrome did not justify with CPAP.  Marland Kitchen Eusebio Me doppplers  03/02/2010    no evidence of DVTno comment on prescence or absence of perip. venous insuff.  . Nm myocar perf wall motion  08/11/2009    EF 64%;LV norm  . Nm myocar perf wall motion  10/22/2005    EF 67%  LV norm    Family History  Problem Relation Age of Onset  . Diabetes Mother   . Osteoarthritis Mother   . Hyperlipidemia Mother   . Alzheimer's disease Mother   . Prostate cancer Father   . Osteoarthritis Brother   . Colon polyps Brother   . Prostate cancer Brother   . Alcohol abuse Brother     History   Social History  . Marital Status: Divorced    Spouse Name: N/A    Number of Children: 3  . Years of Education: 14   Occupational History  . service area     Retired/Disabled   Social History Main Topics  . Smoking status: Never Smoker   . Smokeless tobacco: Never Used  . Alcohol Use: No  . Drug Use: No  . Sexual Activity: Not on file   Other Topics Concern  . Not on file   Social History Narrative   Patient lives at home alone. Patient is retired/Disabled.   Education two years of college.   Right handed.   Caffeine - None    Review of systems: The patient specifically denies any chest pain at rest exertion, dyspnea at rest, orthopnea, paroxysmal nocturnal dyspnea, syncope, palpitations, focal neurological deficits, intermittent claudication, lower extremity edema, unexplained weight gain, cough, hemoptysis or wheezing.   PHYSICAL EXAM BP 132/76  Pulse 98  Resp 16  Ht $R'5\' 3"'rE$  (1.6 m)  Wt 87.771 kg (193 lb 8 oz)  BMI 34.29 kg/m2 General: Alert, oriented x3, no distress, moderately obese  Head: no evidence of trauma, PERRL, EOMI, no exophtalmos or lid lag, no myxedema, no xanthelasma; normal ears, nose  and oropharynx  Neck: normal jugular venous pulsations and no hepatojugular reflux; brisk carotid pulses without delay and no carotid bruits  Chest: clear to auscultation, no signs of consolidation by percussion or palpation, normal fremitus, symmetrical and full respiratory excursions  Cardiovascular: normal position and quality of the apical impulse, regular rhythm, normal first and second heart sounds, no murmurs, rubs or gallops  Abdomen: no tenderness  or distention, no masses by palpation, no abnormal pulsatility or arterial bruits, normal bowel sounds, no hepatosplenomegaly  Extremities: no clubbing, cyanosis or edema; 2+ radial, ulnar and brachial pulses bilaterally; 2+ right femoral, posterior tibial and dorsalis pedis pulses; 2+ left femoral, posterior tibial and dorsalis pedis pulses; no subclavian or femoral bruits  Neurological: grossly nonfocal   EKG: Sinus rhythm, generalized low voltage  Lipid Panel (not on statin)    Component Value Date/Time   CHOL 247* 09/01/2012 1230   TRIG 183* 09/01/2012 1230   HDL 47 09/01/2012 1230   CHOLHDL 5.3 09/01/2012 1230   VLDL 37 09/01/2012 1230   LDLCALC 163* 09/01/2012 1230   LDLDIRECT 172.2 08/07/2010 1125    BMET    Component Value Date/Time   NA 140 09/01/2012 1230   K 4.7 09/01/2012 1230   CL 100 09/01/2012 1230   CO2 28 09/01/2012 1230   GLUCOSE 91 09/01/2012 1230   BUN 26* 09/01/2012 1230   CREATININE 1.14* 09/01/2012 1230   CREATININE 1.2 10/26/2011 1531   CALCIUM 9.7 09/01/2012 1230   GFRNONAA >60 07/04/2010 0520   GFRAA  Value: >60        The eGFR has been calculated using the MDRD equation. This calculation has not been validated in all clinical situations. eGFR's persistently <60 mL/min signify possible Chronic Kidney Disease. 07/04/2010 0520     ASSESSMENT AND PLAN HYPERLIPIDEMIA  Her most recent labs while taking therapy in 2013 showed total cholesterol 166 triglycerides 142 HDL 44 LDL 94 all of which were within the desirable range. Liver function  tests were normal. It is time to repeat these tests, now that she is back on statin therapy.  HTN (hypertension)  Well controlled. Note borderline creatinine of 1.14.  Orders Placed This Encounter  Procedures  . Comprehensive metabolic panel  . Lipid panel  . EKG 12-Lead   Meds ordered this encounter  Medications  . FLUZONE HIGH-DOSE 0.5 ML SUSY    Sig:     Holli Humbles, MD, [facility] [phone] office [phone] pager

## 2014-01-21 ENCOUNTER — Telehealth: Payer: Self-pay | Admitting: Neurology

## 2014-01-21 DIAGNOSIS — R569 Unspecified convulsions: Secondary | ICD-10-CM

## 2014-01-21 MED ORDER — DIVALPROEX SODIUM ER 500 MG PO TB24: ORAL_TABLET | ORAL | Status: DC

## 2014-01-21 NOTE — Telephone Encounter (Addendum)
I have called her, she fell down with out warning signs, hurt her leg, she walked out restaurant, has transient LOC,  She has a history of epilepsy, taking Depakote 500 mg twice a day, I will increase her to 1 in the morning, 2 tablets at evening, No driving till episode free x 6 months Depakote trough level, repeat EEG, [name], gave her a followup appointment after above test

## 2014-01-21 NOTE — Telephone Encounter (Signed)
Patient was coming out of the store and she just fell. Patient want's to no if she had a seizure. I stated to patient I will let Dr.Yan no what happened I asked her did she call her pcp she was ok and didn't want to call her pcp.

## 2014-01-21 NOTE — Telephone Encounter (Signed)
Patient fell flat on her face today, but didn't hurt your face.  Hurt inner side of Left Thigh.  Questioning if this could be a seizure?  Please call and advise.

## 2014-01-25 NOTE — Telephone Encounter (Signed)
Will ask [name] to schedule EEG and follow up.

## 2014-01-26 ENCOUNTER — Telehealth: Payer: Self-pay

## 2014-01-26 DIAGNOSIS — R51 Headache: Secondary | ICD-10-CM

## 2014-01-26 DIAGNOSIS — R519 Headache, unspecified: Secondary | ICD-10-CM

## 2014-01-26 DIAGNOSIS — R569 Unspecified convulsions: Secondary | ICD-10-CM

## 2014-01-26 NOTE — Telephone Encounter (Signed)
Re- order EEG

## 2014-01-27 ENCOUNTER — Ambulatory Visit (INDEPENDENT_AMBULATORY_CARE_PROVIDER_SITE_OTHER): Payer: Medicare HMO | Admitting: Radiology

## 2014-01-27 DIAGNOSIS — R569 Unspecified convulsions: Secondary | ICD-10-CM

## 2014-01-27 DIAGNOSIS — R519 Headache, unspecified: Secondary | ICD-10-CM

## 2014-01-27 DIAGNOSIS — R51 Headache: Secondary | ICD-10-CM

## 2014-01-29 NOTE — Procedures (Signed)
   HISTORY:  73 years old female, presenting with transient upper loss consciousness in October 22nd 2015  TECHNIQUE:  16 channel EEG was performed based on standard 10-16 international system. One channel was dedicated to EKG, which has demonstrates normal sinus rhythm of 66 beats per minutes.  Upon awakening, the posterior background activity was well-developed, in alpha range, 9 Hz, with amplitude of 25 microvoltage, reactive to eye opening and closure.  There was no evidence of epileptiform discharge.  Photic stimulation was performed, which induced a symmetric photic driving.  Hyperventilation was performed, there was no abnormality elicit.  No sleep was achieved.  CONCLUSION: This is a  normal awake EEG.  There is no electrodiagnostic evidence of epileptiform discharge

## 2014-02-10 ENCOUNTER — Telehealth: Payer: Self-pay | Admitting: Cardiovascular Disease

## 2014-02-10 NOTE — Telephone Encounter (Signed)
Pt wants to know if Dr C wants her to continue taking Losartan?

## 2014-02-10 NOTE — Telephone Encounter (Signed)
Returned call at number that called in - man answered and patient no available.   Called cell # - no answer - no VM  Called home # - number not in service.

## 2014-02-11 ENCOUNTER — Telehealth: Payer: Self-pay | Admitting: Neurology

## 2014-02-11 NOTE — Telephone Encounter (Signed)
Ms. [name] came into the office this morning wanting to get her EEG results.

## 2014-02-11 NOTE — Telephone Encounter (Signed)
[name], please call her for normal EEg.

## 2014-02-12 NOTE — Telephone Encounter (Signed)
Will send patient a letter her voice mail box was full . Normal EEG Results.

## 2014-02-12 NOTE — Telephone Encounter (Signed)
[place] at number provided and cell #. 02/12/14 10:07am

## 2014-03-10 ENCOUNTER — Telehealth: Payer: Self-pay | Admitting: Pulmonary Disease

## 2014-03-10 ENCOUNTER — Telehealth: Payer: Self-pay | Admitting: Cardiovascular Disease

## 2014-03-10 ENCOUNTER — Telehealth: Payer: Self-pay | Admitting: Internal Medicine

## 2014-03-10 DIAGNOSIS — Z1239 Encounter for other screening for malignant neoplasm of breast: Secondary | ICD-10-CM

## 2014-03-10 MED ORDER — PREDNISONE 10 MG PO TABS: ORAL_TABLET | ORAL | Status: DC

## 2014-03-10 MED ORDER — LOSARTAN POTASSIUM-HCTZ 50-12.5 MG PO TABS: 1.0000 | ORAL_TABLET | Freq: Every day | ORAL | Status: DC

## 2014-03-10 NOTE — Telephone Encounter (Signed)
Pt wants to know if Dr C wants her to continue taking her medicine,.Losartan? If so please call it in to [facility].

## 2014-03-10 NOTE — Telephone Encounter (Signed)
Returned call to patient Dr.Croitoru advised needs to restart Losartan/Hctz 50 /12.5 mg daily.Prescription sent to pharmacy.

## 2014-03-10 NOTE — Telephone Encounter (Signed)
Spoke with pt, c/o increased sob, prod cough with white mucus sometimes tinged with dark "old" blood, fatigued, states she feels like she can't get in a good breath.  Denies chills/fever.  Is requesting something to be sent in to [facility] on [address]. Last seen: 03/24/13 Next visit: no visit scheduled or in recall queue  Dr. Gwenette Greet please advise.  Thanks!  No Known Allergies Current Outpatient Prescriptions on File Prior to Visit  Medication Sig Dispense Refill  . busPIRone (BUSPAR) 10 MG tablet Take 1 tablet (10 mg total) by mouth 3 (three) times daily. 90 tablet 1  . butalbital-acetaminophen-caffeine (FIORICET, ESGIC) 50-325-40 MG per tablet Take 1 tablet by mouth every 6 (six) hours as needed for headache. 30 tablet 5  . Calcium Carb-Cholecalciferol (CALCIUM 1000 + D PO) Take 1 tablet by mouth 3 (three) times daily.    . cyclobenzaprine (FLEXERIL) 5 MG tablet Take 1 tablet (5 mg total) by mouth at bedtime. X 1 week, then as needed for muscle spasms 30 tablet 0  . divalproex (DEPAKOTE ER) 500 MG 24 hr tablet One po am,  then 2 tabs po qhs. 90 tablet 11  . FLUZONE HIGH-DOSE 0.5 ML SUSY     . loratadine (CLARITIN) 10 MG tablet Take 1 tablet (10 mg total) by mouth daily as needed for allergies. 30 tablet 2  . losartan-hydrochlorothiazide (HYZAAR) 50-12.5 MG per tablet Take 1 tablet by mouth daily. 30 tablet 6  . mometasone-formoterol (DULERA) 100-5 MCG/ACT AERO Inhale 2 puffs into the lungs 2 (two) times daily.    . nortriptyline (PAMELOR) 25 MG capsule take 1 capsule by mouth at bedtime 30 capsule 3  . omeprazole (PRILOSEC) 40 MG capsule take 1 capsule by mouth twice a day 60 capsule 2  . PROAIR HFA 108 (90 BASE) MCG/ACT inhaler inhale 2 puffs by mouth every 6 hours AS NEEDED FOR WHEEZING 8.5 g 1  . propranolol ER (INDERAL LA) 120 MG 24 hr capsule take 1 capsule by mouth once daily 30 capsule 5  . simvastatin (ZOCOR) 20 MG tablet TAKE 1 TABLET BY MOUTH EVERY EVENING 30 tablet 5  .  valACYclovir (VALTREX) 1000 MG tablet Take 1,000 mg by mouth daily as needed (for outbreaks).     . zolpidem (AMBIEN) 5 MG tablet Take 1 tablet (5 mg total) by mouth at bedtime as needed for sleep. 30 tablet 5   No current facility-administered medications on file prior to visit.

## 2014-03-10 NOTE — Telephone Encounter (Signed)
pred taper called in.  atc pt to make her aware, a man answered and stated that she 'had already left", i left him our number and advised him to have her contact us tomorrow when she is available.

## 2014-03-10 NOTE — Telephone Encounter (Signed)
Make sure she is taking her inhaler twice a day as prescribed. Ok to call in prednisone taper over 8 days.

## 2014-03-10 NOTE — Telephone Encounter (Signed)
This is an important part of her blood pressure treatment. She should restart it.

## 2014-03-10 NOTE — Telephone Encounter (Signed)
Returned call to patient she stated she has been out of Losartan/Hctz 50/12.5 mg for 2 months.Stated she wanted to ask Dr.Croitoru if he wants her to take,will need refill sent to pharmacy.Message sent to Dr.Croitoru.

## 2014-03-10 NOTE — Telephone Encounter (Signed)
Is requesting referral to [facility] for a breast exam.

## 2014-03-11 NOTE — Telephone Encounter (Signed)
LM for pt to returncall

## 2014-03-12 NOTE — Telephone Encounter (Signed)
ATC home NA, so I called the pharmacy and the pt has picked up the pred taper. Nothing further needed. [place], CMA

## 2014-03-15 ENCOUNTER — Other Ambulatory Visit: Payer: Self-pay | Admitting: Internal Medicine

## 2014-03-19 ENCOUNTER — Other Ambulatory Visit: Payer: Self-pay | Admitting: Internal Medicine

## 2014-03-21 ENCOUNTER — Other Ambulatory Visit: Payer: Self-pay | Admitting: Neurology

## 2014-03-23 NOTE — Telephone Encounter (Signed)
Rx signed and faxed.

## 2014-04-01 ENCOUNTER — Other Ambulatory Visit: Payer: Self-pay

## 2014-04-01 DIAGNOSIS — Z1239 Encounter for other screening for malignant neoplasm of breast: Secondary | ICD-10-CM

## 2014-04-21 ENCOUNTER — Other Ambulatory Visit: Payer: Self-pay | Admitting: Internal Medicine

## 2014-05-11 ENCOUNTER — Telehealth: Payer: Self-pay | Admitting: Neurology

## 2014-05-11 DIAGNOSIS — M5416 Radiculopathy, lumbar region: Secondary | ICD-10-CM

## 2014-05-11 NOTE — Telephone Encounter (Signed)
[name], I have put referral for neurosurgeon, their office will contact her for appointment, if she has further questions, please give her a follow-up appointment, last visit was in June 2015

## 2014-05-11 NOTE — Telephone Encounter (Signed)
Patient aware of referral and will wait for an appointment from neurosurgery.

## 2014-05-11 NOTE — Telephone Encounter (Signed)
Spoke to patient - she is ready for a neurosurgery consult - she did not have anyone specific she would like to see.

## 2014-05-11 NOTE — Telephone Encounter (Signed)
Pt is calling and stating she is ready to have surgery on her back and she wishes for Dr. Krista Blue to go ahead and set this up for her.  Please call and advise.

## 2014-05-21 ENCOUNTER — Telehealth: Payer: Self-pay | Admitting: *Deleted

## 2014-05-21 NOTE — Telephone Encounter (Signed)
Labs from [facility] at [place] [phone]) - resulted 05/09/14:  -Normal CBC -CMP: eGFR 45 -TSH: 3.31 -Valproic Acid: 66

## 2014-05-27 ENCOUNTER — Other Ambulatory Visit: Payer: Self-pay | Admitting: Neurosurgery

## 2014-05-31 ENCOUNTER — Encounter (HOSPITAL_COMMUNITY): Payer: Self-pay

## 2014-05-31 ENCOUNTER — Encounter (HOSPITAL_COMMUNITY)
Admission: RE | Admit: 2014-05-31 | Discharge: 2014-05-31 | Disposition: A | Discharge status: Home / Self Care | Payer: Commercial Managed Care - HMO | Source: Ambulatory Visit | Attending: Neurosurgery | Admitting: Neurosurgery

## 2014-05-31 DIAGNOSIS — Z01812 Encounter for preprocedural laboratory examination: Secondary | ICD-10-CM | POA: Insufficient documentation

## 2014-05-31 DIAGNOSIS — M4806 Spinal stenosis, lumbar region: Secondary | ICD-10-CM

## 2014-05-31 HISTORY — DX: Edema, unspecified: R60.9

## 2014-05-31 HISTORY — DX: Localized edema: R60.0

## 2014-05-31 HISTORY — DX: Unspecified asthma, uncomplicated: J45.909

## 2014-05-31 HISTORY — DX: Other amnesia: R41.3

## 2014-05-31 HISTORY — DX: Pneumonia, unspecified organism: J18.9

## 2014-05-31 LAB — BASIC METABOLIC PANEL
ANION GAP: 6 (ref 5–15)
BUN: 15 mg/dL (ref 6–23)
CO2: 27 mmol/L (ref 19–32)
CREATININE: 1.19 mg/dL — AB (ref 0.50–1.10)
Calcium: 9.3 mg/dL (ref 8.4–10.5)
Chloride: 105 mmol/L (ref 96–112)
GFR calc Af Amer: 52 mL/min — ABNORMAL LOW (ref >90)
GFR calc non Af Amer: 44 mL/min — ABNORMAL LOW (ref >90)
GLUCOSE: 107 mg/dL — AB (ref 70–99)
Potassium: 4.1 mmol/L (ref 3.5–5.1)
Sodium: 138 mmol/L (ref 135–145)

## 2014-05-31 LAB — CBC
HCT: 41.6 % (ref 36.0–46.0)
HEMOGLOBIN: 13.9 g/dL (ref 12.0–15.0)
MCH: 30.5 pg (ref 26.0–34.0)
MCHC: 33.4 g/dL (ref 30.0–36.0)
MCV: 91.2 fL (ref 78.0–100.0)
Platelets: 168 10*3/uL (ref 150–400)
RBC: 4.56 MIL/uL (ref 3.87–5.11)
RDW: 12.7 % (ref 11.5–15.5)
WBC: 7.1 10*3/uL (ref 4.0–10.5)

## 2014-05-31 LAB — SURGICAL PCR SCREEN
MRSA, PCR: NEGATIVE
STAPHYLOCOCCUS AUREUS: NEGATIVE

## 2014-05-31 NOTE — Pre-Procedure Instructions (Signed)
Alean Kromer  05/31/2014   Your procedure is scheduled on:  Wed, Mar 2 @ 3:05 PM  Report to [facility]  At 12:00 PM.  Call this number if you have problems the morning of surgery: [phone]   Remember:   Do not eat food or drink liquids after midnight.   Take these medicines the morning of surgery with A SIP OF WATER: Buspirone(Buspar),Claritin(Loratadine),Dulera<Bring Your Inhaler With You>,Omeprazole(Prilosec),ProAir,Propranolol(Inderal),and Valtrex(Valacyclovir)              No Goody's,BC's,Aleve,Aspirin,Ibuprofen,Fish Oil,or any Herbal Medications.   Do not wear jewelry, make-up or nail polish.  Do not wear lotions, powders, or perfumes. You may wear deodorant.  Do not shave 48 hours prior to surgery.   Do not bring valuables to the hospital.  [facility] is not responsible                  for any belongings or valuables.               Contacts, dentures or bridgework may not be worn into surgery.  Leave suitcase in the car. After surgery it may be brought to your room.  For patients admitted to the hospital, discharge time is determined by your                treatment team.                   Special Instructions:  [facility] - Preparing for Surgery  Before surgery, you can play an important role.  Because skin is not sterile, your skin needs to be as free of germs as possible.  You can reduce the number of germs on you skin by washing with CHG (chlorahexidine gluconate) soap before surgery.  CHG is an antiseptic cleaner which kills germs and bonds with the skin to continue killing germs even after washing.  Please DO NOT use if you have an allergy to CHG or antibacterial soaps.  If your skin becomes reddened/irritated stop using the CHG and inform your nurse when you arrive at Short Stay.  Do not shave (including legs and underarms) for at least 48 hours prior to the first CHG shower.  You may shave your face.  Please follow these instructions  carefully:   1.  Shower with CHG Soap the night before surgery and the                                morning of Surgery.  2.  If you choose to wash your hair, wash your hair first as usual with your       normal shampoo.  3.  After you shampoo, rinse your hair and body thoroughly to remove the                      Shampoo.  4.  Use CHG as you would any other liquid soap.  You can apply chg directly       to the skin and wash gently with scrungie or a clean washcloth.  5.  Apply the CHG Soap to your body ONLY FROM THE NECK DOWN.        Do not use on open wounds or open sores.  Avoid contact with your eyes,       ears, mouth and genitals (private parts).  Wash genitals (private parts)  with your normal soap.  6.  Wash thoroughly, paying special attention to the area where your surgery        will be performed.  7.  Thoroughly rinse your body with warm water from the neck down.  8.  DO NOT shower/wash with your normal soap after using and rinsing off       the CHG Soap.  9.  Pat yourself dry with a clean towel.            10.  Wear clean pajamas.            11.  Place clean sheets on your bed the night of your first shower and do not        sleep with pets.  Day of Surgery  Do not apply any lotions/deoderants the morning of surgery.  Please wear clean clothes to the hospital/surgery center.    Please read over the following fact sheets that you were given: Pain Booklet, Coughing and Deep Breathing, MRSA Information and Surgical Site Infection Prevention

## 2014-05-31 NOTE — Progress Notes (Addendum)
Dr.Croitoru is cardiologist with last visit in epic from 01-14-14  Echo report in epic from 2013  Stress test reports in epic from 2007/2011  Sleep study in epic from 2013  EKG in epic from 01-14-14  CXR in epic from 07-22-13  Medical Md is Dr.Valerie Asa Lente

## 2014-06-01 MED ORDER — CEFAZOLIN SODIUM-DEXTROSE 2-3 GM-% IV SOLR
2.0000 g | INTRAVENOUS | Status: AC
  Administered 2014-06-02: 2 g via INTRAVENOUS
  Filled 2014-06-01: qty 50
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
## 2014-06-01 NOTE — Progress Notes (Signed)
Anesthesia Chart Review: Patient is a 74 year old female posted for L4-5 laminectomy on 06/02/14 by Dr. Joya Salm.  History includes non-smoker, HLD, HTN, Grave's disease (Dr. Chalmers Cater), migraines, GERD, asthma, seizures, depression, edema, short term memory loss, fibromyalgia, right TKA. PCP is Dr. Asa Lente. Pulmonologist is Dr. Gwenette Greet. Cardiologist is Dr. Sallyanne Kuster, last seen 12/2013 for f/u HTN and HLD.  Meds include Buspar, Fioricet, Depakote, Claritin, Hyzaar, Dulera, Pamelor, Prilosec, Proair Inderal, Zocor, Ambien  PAT Vitals: HR 81, BP 142/76, 98% RA.  01/14/14 EKG: NSR, low voltage QRS, non-specific ST/T wave changes.   06/21/11 Echo: Normal LV systolic function, mild grade 1 diastolic dysfunction with no evidence of elevated left atrial pressure, no significant valvular abnormalities, normal size IVC consistent with low RA pressure, normal estimated PAP.   08/11/09 nuclear stress test: Normal myocardial perfusion scan demonstrating attenuation artifact in the anterior region of the myocardium. No ischemia or infarct/scar is seen in the remaining myocardium. Low risk scan. Post stress EF 64%.  07/22/13 CXR: no active cardiopulmonary disease.  Preoperative labs noted.   If no acute cardiopulmonary issues then I would anticipate that she can proceed as planned.  George Hugh [facility]/Anesthesiology Phone [phone] 06/01/2014 10:26 AM

## 2014-06-02 ENCOUNTER — Encounter (HOSPITAL_COMMUNITY): Payer: Self-pay | Admitting: *Deleted

## 2014-06-02 ENCOUNTER — Inpatient Hospital Stay (HOSPITAL_COMMUNITY)
Admission: RE | Admit: 2014-06-02 | Discharge: 2014-06-06 | DRG: 517 | Disposition: A | Discharge status: Skilled Nursing Facility | Payer: Commercial Managed Care - HMO | Source: Ambulatory Visit | Attending: Neurosurgery | Admitting: Neurosurgery

## 2014-06-02 ENCOUNTER — Ambulatory Visit (HOSPITAL_COMMUNITY): Payer: Commercial Managed Care - HMO | Admitting: Vascular Surgery

## 2014-06-02 ENCOUNTER — Ambulatory Visit (HOSPITAL_COMMUNITY): Payer: Commercial Managed Care - HMO | Admitting: Certified Registered Nurse Anesthetist

## 2014-06-02 ENCOUNTER — Encounter (HOSPITAL_COMMUNITY)
Admission: RE | Disposition: A | Discharge status: Skilled Nursing Facility | Payer: Self-pay | Source: Ambulatory Visit | Attending: Neurosurgery

## 2014-06-02 ENCOUNTER — Ambulatory Visit (HOSPITAL_COMMUNITY): Payer: Commercial Managed Care - HMO

## 2014-06-02 DIAGNOSIS — M48062 Spinal stenosis, lumbar region with neurogenic claudication: Secondary | ICD-10-CM | POA: Diagnosis present

## 2014-06-02 DIAGNOSIS — Z96651 Presence of right artificial knee joint: Secondary | ICD-10-CM | POA: Diagnosis present

## 2014-06-02 DIAGNOSIS — M79605 Pain in left leg: Secondary | ICD-10-CM | POA: Diagnosis present

## 2014-06-02 DIAGNOSIS — M797 Fibromyalgia: Secondary | ICD-10-CM | POA: Diagnosis present

## 2014-06-02 DIAGNOSIS — M4806 Spinal stenosis, lumbar region: Principal | ICD-10-CM | POA: Diagnosis present

## 2014-06-02 DIAGNOSIS — I10 Essential (primary) hypertension: Secondary | ICD-10-CM | POA: Diagnosis present

## 2014-06-02 DIAGNOSIS — K219 Gastro-esophageal reflux disease without esophagitis: Secondary | ICD-10-CM | POA: Diagnosis present

## 2014-06-02 DIAGNOSIS — Z01812 Encounter for preprocedural laboratory examination: Secondary | ICD-10-CM | POA: Diagnosis not present

## 2014-06-02 DIAGNOSIS — E785 Hyperlipidemia, unspecified: Secondary | ICD-10-CM | POA: Diagnosis present

## 2014-06-02 DIAGNOSIS — M48061 Spinal stenosis, lumbar region without neurogenic claudication: Secondary | ICD-10-CM

## 2014-06-02 DIAGNOSIS — F329 Major depressive disorder, single episode, unspecified: Secondary | ICD-10-CM | POA: Diagnosis present

## 2014-06-02 HISTORY — PX: LUMBAR LAMINECTOMY/DECOMPRESSION MICRODISCECTOMY: SHX5026

## 2014-06-02 SURGERY — LUMBAR LAMINECTOMY/DECOMPRESSION MICRODISCECTOMY 1 LEVEL
Anesthesia: General | Site: Spine Lumbar

## 2014-06-02 MED ORDER — GLYCOPYRROLATE 0.2 MG/ML IJ SOLN
INTRAMUSCULAR | Status: DC | PRN
  Administered 2014-06-02: 0.6 mg via INTRAVENOUS

## 2014-06-02 MED ORDER — ONDANSETRON HCL 4 MG/2ML IJ SOLN
INTRAMUSCULAR | Status: AC
  Filled 2014-06-02: qty 2

## 2014-06-02 MED ORDER — LACTATED RINGERS IV SOLN
INTRAVENOUS | Status: DC | PRN
  Administered 2014-06-02 (×2): via INTRAVENOUS

## 2014-06-02 MED ORDER — STERILE WATER FOR INJECTION IJ SOLN
INTRAMUSCULAR | Status: AC
  Filled 2014-06-02: qty 10

## 2014-06-02 MED ORDER — LOSARTAN POTASSIUM 50 MG PO TABS
50.0000 mg | ORAL_TABLET | Freq: Every day | ORAL | Status: DC
  Administered 2014-06-03 – 2014-06-06 (×4): 50 mg via ORAL
  Filled 2014-06-02 (×5): qty 1

## 2014-06-02 MED ORDER — OXYCODONE-ACETAMINOPHEN 5-325 MG PO TABS
1.0000 | ORAL_TABLET | ORAL | Status: DC | PRN
  Administered 2014-06-02 – 2014-06-05 (×10): 2 via ORAL
  Filled 2014-06-02 (×12): qty 2

## 2014-06-02 MED ORDER — ROCURONIUM BROMIDE 50 MG/5ML IV SOLN
INTRAVENOUS | Status: AC
  Filled 2014-06-02: qty 1

## 2014-06-02 MED ORDER — DIVALPROEX SODIUM ER 500 MG PO TB24
500.0000 mg | ORAL_TABLET | Freq: Once | ORAL | Status: AC
  Administered 2014-06-02: 500 mg via ORAL
  Filled 2014-06-02 (×2): qty 1

## 2014-06-02 MED ORDER — ALBUTEROL SULFATE (2.5 MG/3ML) 0.083% IN NEBU: 2.5000 mg | INHALATION_SOLUTION | RESPIRATORY_TRACT | Status: DC | PRN

## 2014-06-02 MED ORDER — MOMETASONE FURO-FORMOTEROL FUM 100-5 MCG/ACT IN AERO
2.0000 | INHALATION_SPRAY | Freq: Two times a day (BID) | RESPIRATORY_TRACT | Status: DC
  Administered 2014-06-02 – 2014-06-05 (×7): 2 via RESPIRATORY_TRACT
  Filled 2014-06-02: qty 8.8

## 2014-06-02 MED ORDER — MIDAZOLAM HCL 2 MG/2ML IJ SOLN
INTRAMUSCULAR | Status: AC
  Filled 2014-06-02: qty 2

## 2014-06-02 MED ORDER — NEOSTIGMINE METHYLSULFATE 10 MG/10ML IV SOLN
INTRAVENOUS | Status: DC | PRN
  Administered 2014-06-02: 4 mg via INTRAVENOUS

## 2014-06-02 MED ORDER — HYDROCHLOROTHIAZIDE 12.5 MG PO CAPS
12.5000 mg | ORAL_CAPSULE | Freq: Every day | ORAL | Status: DC
  Administered 2014-06-04 – 2014-06-05 (×2): 12.5 mg via ORAL
  Filled 2014-06-02 (×4): qty 1

## 2014-06-02 MED ORDER — DIAZEPAM 5 MG PO TABS
5.0000 mg | ORAL_TABLET | Freq: Four times a day (QID) | ORAL | Status: DC | PRN
  Administered 2014-06-02 – 2014-06-05 (×4): 5 mg via ORAL
  Filled 2014-06-02 (×4): qty 1

## 2014-06-02 MED ORDER — ONDANSETRON HCL 4 MG/2ML IJ SOLN: 4.0000 mg | INTRAMUSCULAR | Status: DC | PRN

## 2014-06-02 MED ORDER — ARTIFICIAL TEARS OP OINT
TOPICAL_OINTMENT | OPHTHALMIC | Status: DC | PRN
  Administered 2014-06-02: 1 via OPHTHALMIC

## 2014-06-02 MED ORDER — SODIUM CHLORIDE 0.9 % IV SOLN
250.0000 mL | INTRAVENOUS | Status: DC
Start: 2014-06-02 — End: 2014-06-04

## 2014-06-02 MED ORDER — DIVALPROEX SODIUM ER 500 MG PO TB24
500.0000 mg | ORAL_TABLET | Freq: Two times a day (BID) | ORAL | Status: DC
  Administered 2014-06-02 – 2014-06-06 (×8): 500 mg via ORAL
  Filled 2014-06-02 (×11): qty 1

## 2014-06-02 MED ORDER — ROCURONIUM BROMIDE 100 MG/10ML IV SOLN
INTRAVENOUS | Status: DC | PRN
  Administered 2014-06-02: 30 mg via INTRAVENOUS

## 2014-06-02 MED ORDER — PROMETHAZINE HCL 25 MG/ML IJ SOLN: 6.2500 mg | INTRAMUSCULAR | Status: DC | PRN

## 2014-06-02 MED ORDER — MENTHOL 3 MG MT LOZG
1.0000 | LOZENGE | OROMUCOSAL | Status: DC | PRN
  Filled 2014-06-02: qty 9

## 2014-06-02 MED ORDER — FENTANYL CITRATE 0.05 MG/ML IJ SOLN
INTRAMUSCULAR | Status: AC
  Filled 2014-06-02: qty 5

## 2014-06-02 MED ORDER — HYDROMORPHONE HCL 1 MG/ML IJ SOLN
0.2500 mg | INTRAMUSCULAR | Status: DC | PRN
  Administered 2014-06-02 (×2): 0.5 mg via INTRAVENOUS

## 2014-06-02 MED ORDER — LIDOCAINE HCL (CARDIAC) 20 MG/ML IV SOLN
INTRAVENOUS | Status: AC
  Filled 2014-06-02: qty 5

## 2014-06-02 MED ORDER — OXYCODONE HCL 5 MG/5ML PO SOLN: 5.0000 mg | Freq: Once | ORAL | Status: DC | PRN

## 2014-06-02 MED ORDER — LACTATED RINGERS IV SOLN
INTRAVENOUS | Status: DC
  Administered 2014-06-02: 13:00:00 via INTRAVENOUS

## 2014-06-02 MED ORDER — ONDANSETRON HCL 4 MG/2ML IJ SOLN
INTRAMUSCULAR | Status: DC | PRN
  Administered 2014-06-02: 4 mg via INTRAVENOUS

## 2014-06-02 MED ORDER — OXYCODONE HCL 5 MG PO TABS: 5.0000 mg | ORAL_TABLET | Freq: Once | ORAL | Status: DC | PRN

## 2014-06-02 MED ORDER — SIMVASTATIN 20 MG PO TABS
20.0000 mg | ORAL_TABLET | Freq: Every evening | ORAL | Status: DC
  Administered 2014-06-02 – 2014-06-05 (×4): 20 mg via ORAL
  Filled 2014-06-02 (×4): qty 1

## 2014-06-02 MED ORDER — HEMOSTATIC AGENTS (NO CHARGE) OPTIME
TOPICAL | Status: DC | PRN
  Administered 2014-06-02: 1 via TOPICAL

## 2014-06-02 MED ORDER — FENTANYL CITRATE 0.05 MG/ML IJ SOLN
INTRAMUSCULAR | Status: DC | PRN
  Administered 2014-06-02: 100 ug via INTRAVENOUS
  Administered 2014-06-02 (×3): 50 ug via INTRAVENOUS

## 2014-06-02 MED ORDER — 0.9 % SODIUM CHLORIDE (POUR BTL) OPTIME
TOPICAL | Status: DC | PRN
  Administered 2014-06-02: 1000 mL

## 2014-06-02 MED ORDER — THROMBIN 5000 UNITS EX SOLR
CUTANEOUS | Status: DC | PRN
  Administered 2014-06-02 (×2): 5000 [IU] via TOPICAL

## 2014-06-02 MED ORDER — MIDAZOLAM HCL 5 MG/5ML IJ SOLN
INTRAMUSCULAR | Status: DC | PRN
  Administered 2014-06-02: 2 mg via INTRAVENOUS

## 2014-06-02 MED ORDER — BUPIVACAINE LIPOSOME 1.3 % IJ SUSP
INTRAMUSCULAR | Status: DC | PRN
  Administered 2014-06-02: 20 mL

## 2014-06-02 MED ORDER — EPHEDRINE SULFATE 50 MG/ML IJ SOLN
INTRAMUSCULAR | Status: AC
  Filled 2014-06-02: qty 1

## 2014-06-02 MED ORDER — CEFAZOLIN SODIUM 1-5 GM-% IV SOLN
1.0000 g | Freq: Three times a day (TID) | INTRAVENOUS | Status: AC
  Administered 2014-06-02 – 2014-06-03 (×2): 1 g via INTRAVENOUS
  Filled 2014-06-02 (×2): qty 50

## 2014-06-02 MED ORDER — ARTIFICIAL TEARS OP OINT
TOPICAL_OINTMENT | OPHTHALMIC | Status: AC
  Filled 2014-06-02: qty 3.5

## 2014-06-02 MED ORDER — LORATADINE 10 MG PO TABS
10.0000 mg | ORAL_TABLET | Freq: Every day | ORAL | Status: DC | PRN
  Filled 2014-06-02: qty 1

## 2014-06-02 MED ORDER — HYDROMORPHONE HCL 1 MG/ML IJ SOLN
INTRAMUSCULAR | Status: AC
  Filled 2014-06-02: qty 1

## 2014-06-02 MED ORDER — VANCOMYCIN HCL 1000 MG IV SOLR
INTRAVENOUS | Status: DC | PRN
  Administered 2014-06-02 (×2): 1000 mg via TOPICAL

## 2014-06-02 MED ORDER — SODIUM CHLORIDE 0.9 % IJ SOLN
3.0000 mL | Freq: Two times a day (BID) | INTRAMUSCULAR | Status: DC
  Administered 2014-06-03: 3 mL via INTRAVENOUS

## 2014-06-02 MED ORDER — VANCOMYCIN HCL 1000 MG IV SOLR
INTRAVENOUS | Status: AC
  Filled 2014-06-02: qty 2000

## 2014-06-02 MED ORDER — SODIUM CHLORIDE 0.9 % IJ SOLN: 3.0000 mL | INTRAMUSCULAR | Status: DC | PRN

## 2014-06-02 MED ORDER — BUPIVACAINE LIPOSOME 1.3 % IJ SUSP
20.0000 mL | INTRAMUSCULAR | Status: DC
  Filled 2014-06-02: qty 20

## 2014-06-02 MED ORDER — ACETAMINOPHEN 325 MG PO TABS: 650.0000 mg | ORAL_TABLET | ORAL | Status: DC | PRN

## 2014-06-02 MED ORDER — PHENOL 1.4 % MT LIQD
1.0000 | OROMUCOSAL | Status: DC | PRN
  Filled 2014-06-02: qty 177

## 2014-06-02 MED ORDER — ZOLPIDEM TARTRATE 5 MG PO TABS: 5.0000 mg | ORAL_TABLET | Freq: Every evening | ORAL | Status: DC | PRN

## 2014-06-02 MED ORDER — MORPHINE SULFATE 2 MG/ML IJ SOLN
1.0000 mg | INTRAMUSCULAR | Status: DC | PRN
  Administered 2014-06-03 – 2014-06-04 (×3): 2 mg via INTRAVENOUS
  Filled 2014-06-02 (×3): qty 1

## 2014-06-02 MED ORDER — ACETAMINOPHEN 650 MG RE SUPP: 650.0000 mg | RECTAL | Status: DC | PRN

## 2014-06-02 MED ORDER — BUSPIRONE HCL 10 MG PO TABS
10.0000 mg | ORAL_TABLET | Freq: Three times a day (TID) | ORAL | Status: DC
  Administered 2014-06-02 – 2014-06-06 (×12): 10 mg via ORAL
  Filled 2014-06-02 (×14): qty 1

## 2014-06-02 MED ORDER — PROPOFOL 10 MG/ML IV BOLUS
INTRAVENOUS | Status: AC
  Filled 2014-06-02: qty 20

## 2014-06-02 MED ORDER — PROPOFOL 10 MG/ML IV BOLUS
INTRAVENOUS | Status: DC | PRN
  Administered 2014-06-02: 110 mg via INTRAVENOUS

## 2014-06-02 MED ORDER — SODIUM CHLORIDE 0.9 % IV SOLN
INTRAVENOUS | Status: DC
  Administered 2014-06-02 – 2014-06-04 (×3): via INTRAVENOUS

## 2014-06-02 MED ORDER — PROPRANOLOL HCL ER 120 MG PO CP24
120.0000 mg | ORAL_CAPSULE | Freq: Every day | ORAL | Status: DC
  Administered 2014-06-02 – 2014-06-06 (×4): 120 mg via ORAL
  Filled 2014-06-02 (×6): qty 1

## 2014-06-02 MED ORDER — LIDOCAINE HCL (CARDIAC) 20 MG/ML IV SOLN
INTRAVENOUS | Status: DC | PRN
  Administered 2014-06-02: 60 mg via INTRAVENOUS

## 2014-06-02 MED ORDER — LOSARTAN POTASSIUM-HCTZ 50-12.5 MG PO TABS: 1.0000 | ORAL_TABLET | Freq: Every day | ORAL | Status: DC

## 2014-06-02 SURGICAL SUPPLY — 62 items
APL SKNCLS STERI-STRIP NONHPOA (GAUZE/BANDAGES/DRESSINGS) ×1
BENZOIN TINCTURE PRP APPL 2/3 (GAUZE/BANDAGES/DRESSINGS) ×3 IMPLANT
BLADE CLIPPER SURG (BLADE) IMPLANT
BUR ACORN 6.0 (BURR) ×2 IMPLANT
BUR ACORN 6.0MM (BURR) ×1
BUR MATCHSTICK NEURO 3.0 LAGG (BURR) ×2 IMPLANT
CANISTER SUCT 3000ML PPV (MISCELLANEOUS) ×3 IMPLANT
CLOSURE WOUND 1/2 X4 (GAUZE/BANDAGES/DRESSINGS) ×1
CONT SPEC 4OZ CLIKSEAL STRL BL (MISCELLANEOUS) ×3 IMPLANT
DRAPE LAPAROTOMY 100X72X124 (DRAPES) ×3 IMPLANT
DRAPE MICROSCOPE LEICA (MISCELLANEOUS) ×3 IMPLANT
DRAPE POUCH INSTRU U-SHP 10X18 (DRAPES) ×3 IMPLANT
DRSG OPSITE POSTOP 4X8 (GAUZE/BANDAGES/DRESSINGS) ×2 IMPLANT
DRSG PAD ABDOMINAL 8X10 ST (GAUZE/BANDAGES/DRESSINGS) IMPLANT
DURAPREP 26ML APPLICATOR (WOUND CARE) ×3 IMPLANT
ELECT REM PT RETURN 9FT ADLT (ELECTROSURGICAL) ×3
ELECTRODE REM PT RTRN 9FT ADLT (ELECTROSURGICAL) ×1 IMPLANT
GAUZE SPONGE 4X4 12PLY STRL (GAUZE/BANDAGES/DRESSINGS) ×1 IMPLANT
GAUZE SPONGE 4X4 16PLY XRAY LF (GAUZE/BANDAGES/DRESSINGS) IMPLANT
GLOVE BIO SURGEON STRL SZ 6.5 (GLOVE) ×2 IMPLANT
GLOVE BIO SURGEONS STRL SZ 6.5 (GLOVE) ×2
GLOVE BIOGEL M 8.0 STRL (GLOVE) ×5 IMPLANT
GLOVE BIOGEL PI IND STRL 7.0 (GLOVE) IMPLANT
GLOVE BIOGEL PI INDICATOR 7.0 (GLOVE) ×6
GLOVE EXAM NITRILE LRG STRL (GLOVE) IMPLANT
GLOVE EXAM NITRILE MD LF STRL (GLOVE) IMPLANT
GLOVE EXAM NITRILE XL STR (GLOVE) IMPLANT
GLOVE EXAM NITRILE XS STR PU (GLOVE) IMPLANT
GLOVE SURG SS PI 7.0 STRL IVOR (GLOVE) ×6 IMPLANT
GOWN STRL REUS W/ TWL LRG LVL3 (GOWN DISPOSABLE) ×1 IMPLANT
GOWN STRL REUS W/ TWL XL LVL3 (GOWN DISPOSABLE) IMPLANT
GOWN STRL REUS W/TWL 2XL LVL3 (GOWN DISPOSABLE) IMPLANT
GOWN STRL REUS W/TWL LRG LVL3 (GOWN DISPOSABLE) ×3
GOWN STRL REUS W/TWL XL LVL3 (GOWN DISPOSABLE) ×9
KIT BASIN OR (CUSTOM PROCEDURE TRAY) ×3 IMPLANT
KIT ROOM TURNOVER OR (KITS) ×3 IMPLANT
NDL HYPO 18GX1.5 BLUNT FILL (NEEDLE) IMPLANT
NDL HYPO 21X1.5 SAFETY (NEEDLE) IMPLANT
NDL HYPO 25X1 1.5 SAFETY (NEEDLE) IMPLANT
NDL SPNL 20GX3.5 QUINCKE YW (NEEDLE) IMPLANT
NEEDLE HYPO 18GX1.5 BLUNT FILL (NEEDLE) IMPLANT
NEEDLE HYPO 21X1.5 SAFETY (NEEDLE) IMPLANT
NEEDLE HYPO 25X1 1.5 SAFETY (NEEDLE) IMPLANT
NEEDLE SPNL 20GX3.5 QUINCKE YW (NEEDLE) IMPLANT
NS IRRIG 1000ML POUR BTL (IV SOLUTION) ×3 IMPLANT
PACK LAMINECTOMY NEURO (CUSTOM PROCEDURE TRAY) ×3 IMPLANT
PAD ARMBOARD 7.5X6 YLW CONV (MISCELLANEOUS) ×9 IMPLANT
PATTIES SURGICAL .5 X1 (DISPOSABLE) ×3 IMPLANT
RUBBERBAND STERILE (MISCELLANEOUS) ×6 IMPLANT
SPONGE LAP 4X18 X RAY DECT (DISPOSABLE) ×2 IMPLANT
SPONGE SURGIFOAM ABS GEL SZ50 (HEMOSTASIS) ×3 IMPLANT
STRIP CLOSURE SKIN 1/2X4 (GAUZE/BANDAGES/DRESSINGS) ×2 IMPLANT
SUT VIC AB 0 CT1 18XCR BRD8 (SUTURE) ×1 IMPLANT
SUT VIC AB 0 CT1 8-18 (SUTURE) ×3
SUT VIC AB 2-0 CP2 18 (SUTURE) ×3 IMPLANT
SUT VIC AB 3-0 SH 8-18 (SUTURE) ×3 IMPLANT
SYR 20CC LL (SYRINGE) IMPLANT
SYR 20ML ECCENTRIC (SYRINGE) ×3 IMPLANT
SYR 5ML LL (SYRINGE) IMPLANT
TOWEL OR 17X24 6PK STRL BLUE (TOWEL DISPOSABLE) ×3 IMPLANT
TOWEL OR 17X26 10 PK STRL BLUE (TOWEL DISPOSABLE) ×3 IMPLANT
WATER STERILE IRR 1000ML POUR (IV SOLUTION) ×3 IMPLANT

## 2014-06-02 NOTE — Anesthesia Preprocedure Evaluation (Addendum)
Anesthesia Evaluation  Patient identified by MRN, date of birth, ID band Patient awake    Reviewed: Allergy & Precautions, NPO status , Patient's Chart, lab work & pertinent test results, reviewed documented beta blocker date and time   Airway Mallampati: II  TM Distance: >3 FB Neck ROM: Full    Dental  (+) Missing, Dental Advisory Given   Pulmonary asthma ,  breath sounds clear to auscultation        Cardiovascular hypertension, Pt. on medications and Pt. on home beta blockers Rhythm:Regular Rate:Normal     Neuro/Psych  Headaches, Seizures -, Well Controlled,  Anxiety Depression    GI/Hepatic Neg liver ROS, GERD-  ,  Endo/Other  Hyperthyroidism Morbid obesity  Renal/GU negative Renal ROS     Musculoskeletal  (+) Arthritis -, Fibromyalgia -  Abdominal   Peds  Hematology negative hematology ROS (+)   Anesthesia Other Findings   Reproductive/Obstetrics                            Anesthesia Physical Anesthesia Plan  ASA: III  Anesthesia Plan: General   Post-op Pain Management:    Induction: Intravenous  Airway Management Planned: Oral ETT  Additional Equipment:   Intra-op Plan:   Post-operative Plan: Extubation in OR  Informed Consent: I have reviewed the patients History and Physical, chart, labs and discussed the procedure including the risks, benefits and alternatives for the proposed anesthesia with the patient or authorized representative who has indicated his/her understanding and acceptance.   Dental advisory given  Plan Discussed with: CRNA  Anesthesia Plan Comments:         Anesthesia Quick Evaluation

## 2014-06-02 NOTE — Progress Notes (Signed)
Pt is admitted to room 4N07 from PACU.

## 2014-06-02 NOTE — Transfer of Care (Signed)
Immediate Anesthesia Transfer of Care Note  Patient: [name]  Procedure(s) Performed: Procedure(s): Lumbar Four-Five Laminectomy  (N/A)  Patient Location: PACU  Anesthesia Type:General  Level of Consciousness: awake, alert  and oriented  Airway & Oxygen Therapy: Patient Spontanous Breathing and Patient connected to nasal cannula oxygen  Post-op Assessment: Report given to RN and Post -op Vital signs reviewed and stable  Post vital signs: Reviewed and stable  Last Vitals:  Filed Vitals:   06/02/14 1154  BP: 164/80  Pulse: 75  Temp: 36.3 C  Resp: 18    Complications: No apparent anesthesia complications

## 2014-06-02 NOTE — Anesthesia Procedure Notes (Signed)
Procedure Name: Intubation Date/Time: 06/02/2014 2:08 PM Performed by: Garrison Columbus T Pre-anesthesia Checklist: Patient identified, Emergency Drugs available, Suction available and Patient being monitored Patient Re-evaluated:Patient Re-evaluated prior to inductionOxygen Delivery Method: Circle system utilized Preoxygenation: Pre-oxygenation with 100% oxygen Intubation Type: IV induction Ventilation: Mask ventilation without difficulty Laryngoscope Size: Miller and 2 Grade View: Grade I Tube type: Oral Tube size: 7.5 mm Number of attempts: 1 Airway Equipment and Method: Stylet Placement Confirmation: ETT inserted through vocal cords under direct vision,  positive ETCO2 and breath sounds checked- equal and bilateral Secured at: 21 cm Tube secured with: Tape Dental Injury: Teeth and Oropharynx as per pre-operative assessment

## 2014-06-02 NOTE — Anesthesia Postprocedure Evaluation (Signed)
  Anesthesia Post-op Note  Patient: [name]  Procedure(s) Performed: Procedure(s): Lumbar Four-Five Laminectomy  (N/A)  Patient Location: PACU  Anesthesia Type:General  Level of Consciousness: awake and alert   Airway and Oxygen Therapy: Patient Spontanous Breathing  Post-op Pain: mild  Post-op Assessment: Post-op Vital signs reviewed, Patient's Cardiovascular Status Stable and Respiratory Function Stable  Post-op Vital Signs: Reviewed  Filed Vitals:   06/02/14 1730  BP: 145/72  Pulse: 56  Temp:   Resp: 14    Complications: No apparent anesthesia complications

## 2014-06-03 ENCOUNTER — Encounter (HOSPITAL_COMMUNITY): Payer: Self-pay | Admitting: Neurosurgery

## 2014-06-03 NOTE — Progress Notes (Signed)
PT Cancellation Note  Patient Details Name: [name] MRN: [id number] DOB: 01-17-1941   Cancelled Treatment:    Reason Eval/Treat Not Completed: Pt just back to bed after sitting up. Will complete eval later today.   Keisha Amer 06/03/2014, 10:50 AM  Pager [phone]

## 2014-06-03 NOTE — Progress Notes (Signed)
Patient ID: [name], female   DOB: 1940/06/08, 74 y.o.   MRN: [id number] Stable. No weakness.incisional pain. hemovac with minimal drainage

## 2014-06-03 NOTE — Evaluation (Signed)
Occupational Therapy Evaluation Patient Details Name: [name] MRN: [id number] DOB: 12/09/1940 Today's Date: 06/03/2014    History of Present Illness Pt s/p l4-5 laminectomies and foraminotomies via Dr Kristeen Mans.    Clinical Impression   Pt with limitations in ADL function secondary to increased pain.  Currently min assist for functional transfers with mod assist for simulated selfcare tasks.  Feel pt Cassidy benefit from acute care OT to help increase overall independence however she lives alone and has no assist post discharge.  Recommend SNF short term for follow-up for pt to become more proficient adhering to her back precautions during ADL tasks.  Cassidy continue to follow acutely.    Follow Up Recommendations  SNF    Equipment Recommendations  None recommended by OT    Recommendations for Other Services       Precautions / Restrictions Precautions Precautions: Fall;Back Required Braces or Orthoses: Spinal Brace Spinal Brace: Lumbar corset;Applied in sitting position Restrictions Weight Bearing Restrictions: No      Mobility Bed Mobility Overal bed mobility: Needs Assistance Bed Mobility: Supine to Sit     Supine to sit: Min assist        Transfers Overall transfer level: Needs assistance Equipment used: Rolling walker (2 wheeled) Transfers: Sit to/from Stand Sit to Stand: Min assist         General transfer comment: Mod instructional cueing for hand placement with transfers.     Balance Overall balance assessment: Needs assistance   Sitting balance-Leahy Scale: Good       Standing balance-Leahy Scale: Fair                              ADL Overall ADL's : Needs assistance/impaired Eating/Feeding: Independent;Sitting   Grooming: Wash/dry hands;Wash/dry face;Oral care;Minimal assistance;Standing;Cueing for safety   Upper Body Bathing: Supervision/ safety;Sitting   Lower Body Bathing: Moderate assistance;Sit to/from stand    Upper Body Dressing : Minimal assistance;Sitting Upper Body Dressing Details (indicate cue type and reason): min assist with min instructional cueing for lumbar corset Lower Body Dressing: Maximal assistance;Sit to/from stand   Toilet Transfer: Minimal assistance;BSC;Ambulation;RW;Cueing for safety   Toileting- Clothing Manipulation and Hygiene: Minimal assistance;Cueing for back precautions;Sit to/from stand         General ADL Comments: Pt currently unaware of her back precautions.  Therapist educated and provided handout.  Pt maintaining flexed lumbar posture in standing during grooming tasks.  Mod instructional cueing to avoid bending over the sink when brushing her teeth.  Cassidy likley need SNF for follow-up therapy as pt has no assistance at home.         Perception Perception Perception Tested?: No   Praxis Praxis Praxis tested?: Within functional limits    Pertinent Vitals/Pain Pain Assessment: 0-10 Pain Score: 6  Pain Location: lower back Pain Intervention(s): Repositioned     Hand Dominance Right   Extremity/Trunk Assessment Upper Extremity Assessment Upper Extremity Assessment: Overall WFL for tasks assessed (not formally assessed secondary to back precautions but WFLs for activities performed)   Lower Extremity Assessment Lower Extremity Assessment: Defer to PT evaluation   Cervical / Trunk Assessment Cervical / Trunk Assessment: Other exceptions Cervical / Trunk Exceptions: Pt with flexed trunk in standing secondary to pain/fatigue.   Communication Communication Communication: No difficulties   Cognition Arousal/Alertness: Awake/alert Behavior During Therapy: WFL for tasks assessed/performed Overall Cognitive Status: Impaired/Different from baseline (Likely due to medications) Area of Impairment: Memory  Memory: Decreased recall of precautions                        Home Living Family/patient expects to be discharged to:: Private  residence Living Arrangements: Alone   Type of Home: Apartment       Home Layout: One level     Bathroom Shower/Tub: Tub/shower unit;Curtain Shower/tub characteristics: Architectural technologist: Standard Bathroom Accessibility: No   Home Equipment: Bedside commode;Walker - 2 wheels;Shower seat          Prior Functioning/Environment Level of Independence: Independent             OT Diagnosis: Generalized weakness;Acute pain   OT Problem List: Decreased strength;Decreased activity tolerance;Impaired balance (sitting and/or standing);Decreased safety awareness;Pain;Decreased knowledge of precautions;Decreased knowledge of use of DME or AE   OT Treatment/Interventions: Self-care/ADL training;Balance training;Patient/family education;Therapeutic activities;DME and/or AE instruction;Cognitive remediation/compensation;Neuromuscular education    OT Goals(Current goals can be found in the care plan section) Acute Rehab OT Goals Patient Stated Goal: To get to feeling better and moving around OT Goal Formulation: With patient Time For Goal Achievement: 06/10/14 Potential to Achieve Goals: Good  OT Frequency: Min 2X/week   Barriers to D/C: Decreased caregiver support             End of Session Equipment Utilized During Treatment: Rolling walker;Back brace Nurse Communication: Mobility status  Activity Tolerance: Patient limited by fatigue;Patient limited by pain Patient left: in chair;with call bell/phone within reach   Time: 0826-0907 OT Time Calculation (min): 41 min Charges:  OT General Charges $OT Visit: 1 Procedure OT Evaluation $Initial OT Evaluation Tier I: 1 Procedure OT Treatments $Self Care/Home Management : 23-37 mins  Keonte Daubenspeck OTR/L 06/03/2014, 9:18 AM

## 2014-06-03 NOTE — Plan of Care (Signed)
Problem: Consults Goal: Diagnosis - Spinal Surgery Outcome: Completed/Met Date Met:  06/03/14 Lumbar Laminectomy (Complex)

## 2014-06-03 NOTE — Progress Notes (Signed)
UR complete.  Irisa Grimsley RN, MSN 

## 2014-06-03 NOTE — Op Note (Signed)
NAME:  [name], [name] NO.:  MEDICAL RECORD NO.:  [id number]  LOCATION:                                 FACILITY:  PHYSICIAN:  Leeroy Cha, M.D.   DATE OF BIRTH:  11-07-1940  DATE OF PROCEDURE:  06/02/2014 DATE OF DISCHARGE:                              OPERATIVE REPORT   PREOPERATIVE DIAGNOSIS:  Lumbar stenosis at L4-L5 and L5-S1 with neurogenic claudication.  POSTOPERATIVE DIAGNOSIS:  Lumbar stenosis at L4-L5 and L5-S1 with neurogenic claudication.  PROCEDURE:  Bilateral L4-L5 laminectomy, decompression of the thecal sac, foraminotomy to decompress the L4, L5, and S1 nerve root. Microscope.  SURGEON:  Leeroy Cha, M.D.  ASSISTANT:  Kary Kos, M.D.  CLINICAL HISTORY:  The patient was in my office complaining of back pain with radiation to both lower extremities associated with walking.  MRI shows severe stenosis at L4-L5.  Surgery was advised.  The patient knew the risks and benefits.  PROCEDURE IN DETAIL:  The patient was taken to the OR, and after intubation, she was positioned in a prone manner.  The back was cleaned with DuraPrep.  Midline incision from L4 to L5-S1 was made and muscles were retracted all the way laterally.  X-rays done revealed we were right at the L4-L5.  We proceeded with removal of the spinous process of L4 and L5 and the lamina of L4-L5.  The patient had quite a bit of thick calcified ligament at the level L4-L5 with quite a bit of stenosis. With the help of the microscope and using microdissection and the 1 and 2-mm Kerrison punch, we removed the calcified yellow ligament.  We went up and below and we were able to decompress the thecal sac with a plenty of space for the nerve root.  The x-rays showed that we were right on target.  From then on, the area was irrigated.  Valsalva maneuver was negative.  Small  drain was left in the operative site.  The wound was covered with vancomycin powder and it was closed with  several layers of Vicryl and Steri-Strips.          ______________________________ Leeroy Cha, M.D.     EB/MEDQ  D:  06/02/2014  T:  06/03/2014  Job:  [id number]

## 2014-06-03 NOTE — Evaluation (Signed)
Physical Therapy Evaluation Patient Details Name: [name] MRN: [id number] DOB: 11/12/1940 Today's Date: 06/03/2014   History of Present Illness  Pt s/p l4-5 laminectomies and foraminotomies via Dr Kristeen Mans.   Clinical Impression  Patient is s/p above surgery resulting in the deficits listed below (see PT Problem List).  Patient will benefit from skilled PT to increase their independence and safety with mobility (while adhering to their precautions) to allow discharge to the venue listed below.     Follow Up Recommendations SNF    Equipment Recommendations  None recommended by PT    Recommendations for Other Services       Precautions / Restrictions Precautions Precautions: Fall;Back Precaution Booklet Issued: Yes (comment) Precaution Comments: pt unable to recall any back precautions from earlier OT session Required Braces or Orthoses: Spinal Brace Spinal Brace: Lumbar corset;Applied in sitting position Restrictions Weight Bearing Restrictions: No      Mobility  Bed Mobility Overal bed mobility: Needs Assistance Bed Mobility: Sit to Sidelying;Rolling Rolling: Min assist       Sit to sidelying: Mod assist General bed mobility comments: vc for technique to maintain back precautions with sit to side and roll to supine  Transfers Overall transfer level: Needs assistance Equipment used: Rolling walker (2 wheeled) Transfers: Sit to/from Stand Sit to Stand: Min assist         General transfer comment: vc for safe use of RW; steady assist due to imbalance when transitioning hands to/from RW  Ambulation/Gait Ambulation/Gait assistance: Min assist Ambulation Distance (Feet): 100 Feet Assistive device: Rolling walker (2 wheeled) Gait Pattern/deviations: Step-through pattern;Decreased stride length;Trunk flexed   Gait velocity interpretation: Below normal speed for age/gender General Gait Details: nearly constant verbal and tactile cues to facilitate upright  posture  Stairs            Wheelchair Mobility    Modified Rankin (Stroke Patients Only)       Balance Overall balance assessment: Needs assistance         Standing balance support: Single extremity supported Standing balance-Leahy Scale: Poor Standing balance comment: required at least unilateral UE support when washing hands or leans down into flexed posture to support herself on her elbows                             Pertinent Vitals/Pain Pain Assessment: 0-10 Pain Score: 0-No pain Pain Intervention(s): Repositioned (pt denied pain throughout session)    Home Living Family/patient expects to be discharged to:: Skilled nursing facility Living Arrangements: Alone                    Prior Function Level of Independence: Independent               Hand Dominance   Dominant Hand: Right    Extremity/Trunk Assessment   Upper Extremity Assessment: Defer to OT evaluation           Lower Extremity Assessment: Generalized weakness      Cervical / Trunk Assessment: Kyphotic  Communication   Communication: No difficulties  Cognition Arousal/Alertness: Lethargic;Suspect due to medications Behavior During Therapy: [facility] for tasks assessed/performed Overall Cognitive Status: Impaired/Different from baseline (Likely due to medications) Area of Impairment: Memory     Memory: Decreased recall of precautions              General Comments      Exercises        Assessment/Plan  PT Assessment Patient needs continued PT services  PT Diagnosis Difficulty walking;Acute pain   PT Problem List Decreased strength;Decreased activity tolerance;Decreased balance;Decreased mobility;Decreased cognition;Decreased knowledge of use of DME;Decreased safety awareness;Decreased knowledge of precautions;Obesity;Pain  PT Treatment Interventions DME instruction;Gait training;Functional mobility training;Therapeutic activities;Balance  training;Cognitive remediation;Patient/family education   PT Goals (Current goals can be found in the Care Plan section) Acute Rehab PT Goals Patient Stated Goal: To get to feeling better and moving around PT Goal Formulation: With patient Time For Goal Achievement: 06/10/14 Potential to Achieve Goals: Good    Frequency Min 5X/week   Barriers to discharge Decreased caregiver support      Co-evaluation               End of Session Equipment Utilized During Treatment: Gait belt;Back brace Activity Tolerance: Patient limited by fatigue Patient left: in bed;with call bell/phone within reach;with family/visitor present           Time: 1520-1546 PT Time Calculation (min) (ACUTE ONLY): 26 min   Charges:   PT Evaluation $Initial PT Evaluation Tier I: 1 Procedure PT Treatments $Gait Training: 8-22 mins   PT G Codes:        Alston Berrie 2014/07/01, 3:55 PM Pager [phone]

## 2014-06-04 MED ORDER — SENNOSIDES-DOCUSATE SODIUM 8.6-50 MG PO TABS
1.0000 | ORAL_TABLET | Freq: Two times a day (BID) | ORAL | Status: DC
  Administered 2014-06-04 – 2014-06-06 (×5): 1 via ORAL
  Filled 2014-06-04 (×5): qty 1

## 2014-06-04 NOTE — Discharge Summary (Signed)
Physician Discharge Summary  Patient ID: Evett Kassa MRN: [id number] DOB/AGE: Feb 12, 1941 74 y.o.  Admit date: 06/02/2014 Discharge date: 06/04/2014  Admission Diagnoses:lumbar stenosis   Discharge Diagnoses:  Active Problems:   Lumbar stenosis with neurogenic claudication   Discharged Condition: no weakness  Hospital Course: surgery  Consults: none  Significant Diagnostic Studies: mri  Treatments: lumbar decompression  Discharge Exam: Blood pressure 123/56, pulse 71, temperature 99.1 F (37.3 C), temperature source Oral, resp. rate 16, weight 85.73 kg (189 lb), SpO2 92 %. No weakness. ambulating  Disposition: SNif.. To see me in 3 to 4 weeks, roxicone for pain     Medication List    ASK your doctor about these medications        busPIRone 10 MG tablet  Commonly known as:  BUSPAR  Take 1 tablet (10 mg total) by mouth 3 (three) times daily.     butalbital-acetaminophen-caffeine 50-325-40 MG per tablet  Commonly known as:  FIORICET, ESGIC  Take 1 tablet by mouth every 6 (six) hours as needed for headache.     CALCIUM 1000 + D PO  Take 1 tablet by mouth 3 (three) times daily.     divalproex 500 MG 24 hr tablet  Commonly known as:  DEPAKOTE ER  One po am,  then 2 tabs po qhs.     loratadine 10 MG tablet  Commonly known as:  CLARITIN  Take 1 tablet (10 mg total) by mouth daily as needed for allergies.     losartan-hydrochlorothiazide 50-12.5 MG per tablet  Commonly known as:  HYZAAR  Take 1 tablet by mouth daily.     mometasone-formoterol 100-5 MCG/ACT Aero  Commonly known as:  DULERA  Inhale 2 puffs into the lungs 2 (two) times daily.     nortriptyline 25 MG capsule  Commonly known as:  PAMELOR  Take 1 capsule (25 mg total) by mouth at bedtime.     omeprazole 40 MG capsule  Commonly known as:  PRILOSEC  take 1 capsule by mouth twice a day     predniSONE 10 MG tablet  Commonly known as:  DELTASONE  40mg  X2 days, then 30mg  X2 days, then 20mg  X2  days, then 10mg  X2 days.     PROAIR HFA 108 (90 BASE) MCG/ACT inhaler  Generic drug:  albuterol  inhale 2 puffs by mouth every 6 hours AS NEEDED FOR WHEEZING     propranolol ER 120 MG 24 hr capsule  Commonly known as:  INDERAL LA  take 1 capsule by mouth once daily     simvastatin 20 MG tablet  Commonly known as:  ZOCOR  TAKE 1 TABLET BY MOUTH EVERY EVENING     valACYclovir 1000 MG tablet  Commonly known as:  VALTREX  Take 1,000 mg by mouth daily as needed (for outbreaks).     zolpidem 5 MG tablet  Commonly known as:  AMBIEN  TAKE 1 TABLET BY MOUTH AT BEDTIME AS NEEDED FRO SLEEP         Signed: Floyce Stakes 06/04/2014, 12:40 PM

## 2014-06-04 NOTE — Progress Notes (Signed)
Patient ID: [name], female   DOB: [birth year], 74 y.o.   MRN: [id number] Stable, ambulating with help. Wants to wait till the weekend to go home. Continue with pt

## 2014-06-04 NOTE — Progress Notes (Signed)
Occupational Therapy Treatment Patient Details Name: [name] MRN: [id number] DOB: 1940-06-11 Today's Date: 06/04/2014    History of present illness Pt s/p l4-5 laminectomies and foraminotomies via Dr Kristeen Mans.    OT comments  Pt making steady progress but still needs min assist for functional transfers and selfcare tasks.  Less pain this session than previous session as well.  Plan for SNF for continued rehab tomorrow.   Follow Up Recommendations  SNF    Equipment Recommendations  None recommended by OT;Other (comment) (TBD next venue of care)       Precautions / Restrictions Precautions Precautions: Fall;Back Precaution Comments: pt only able to recall 1/3 back precautions Required Braces or Orthoses: Spinal Brace Spinal Brace: Thoracolumbosacral orthotic Restrictions Weight Bearing Restrictions: No       Mobility Bed Mobility Overal bed mobility: Needs Assistance Bed Mobility: Rolling;Sidelying to Sit Rolling: Min assist Sidelying to sit: Mod assist       General bed mobility comments: Mod demonstrational cueing for sequencing and technique for rolling and sidelying to sit.  Transfers Overall transfer level: Needs assistance Equipment used: Rolling walker (2 wheeled) Transfers: Sit to/from Stand Sit to Stand: Min assist              Balance Overall balance assessment: Needs assistance   Sitting balance-Leahy Scale: Good       Standing balance-Leahy Scale: Poor Standing balance comment: Pt needs UE support during standing to maintain balance.                   ADL Overall ADL's : Needs assistance/impaired     Grooming: Wash/dry hands;Supervision/safety;Standing           Upper Body Dressing : Sitting;Minimal assistance Upper Body Dressing Details (indicate cue type and reason): for TLSO and for house coat Lower Body Dressing: Moderate assistance   Toilet Transfer: Minimal assistance;Ambulation;Comfort height toilet;RW    Toileting- Clothing Manipulation and Hygiene: Minimal assistance;Adhering to back precautions;Sit to/from stand       Functional mobility during ADLs: Minimal assistance;Cueing for sequencing;Rolling walker General ADL Comments: Pt able to state 1/3 back precautions this session.  Educated her on use of AE for LB selfcare in order to follow them appropriately.  Plan for discharge to [facility] tomorrow for continued therapy.                 Cognition   Behavior During Therapy: WFL for tasks assessed/performed Overall Cognitive Status: No family/caregiver present to determine baseline cognitive functioning Area of Impairment: Memory     Memory: Decreased recall of precautions                            Pertinent Vitals/ Pain       Pain Assessment: Faces Faces Pain Scale: Hurts a little bit Pain Location: lower back Pain Intervention(s): Limited activity within patient's tolerance;Repositioned         Frequency Min 2X/week     Progress Toward Goals  OT Goals(current goals can now be found in the care plan section)  Progress towards OT goals: Progressing toward goals     Plan Discharge plan remains appropriate       End of Session Equipment Utilized During Treatment: Rolling walker;Back brace   Activity Tolerance Patient tolerated treatment well   Patient Left in chair;with call bell/phone within reach   Nurse Communication Mobility status        Time: 1530-1605 OT Time  Calculation (min): 35 min  Charges: OT General Charges $OT Visit: 1 Procedure OT Treatments $Self Care/Home Management : 23-37 mins  Paulette Rockford OTR/L 06/04/2014, 4:26 PM

## 2014-06-04 NOTE — Care Management Note (Signed)
    Page 1 of 1   06/04/2014     11:49:13 AM CARE MANAGEMENT NOTE 06/04/2014  Patient:  [name],[name]   Account Number:  [id number]  Date Initiated:  06/04/2014  Documentation initiated by:  Lorne Skeens  Subjective/Objective Assessment:   Patient was admitted with Stenosis. Lives at home alone.     Action/Plan:   Will follow for discharge needs pending PT/OT evals and physician orders.   Anticipated DC Date:     Anticipated DC Plan:  SKILLED NURSING FACILITY  In-house referral  Clinical Social Worker      DC Planning Services  CM consult      Choice offered to / List presented to:             Status of service:   Medicare Important Message given?  YES (If response is "NO", the following Medicare IM given date fields will be blank) Date Medicare IM given:  06/04/2014 Medicare IM given by:  Lorne Skeens Date Additional Medicare IM given:   Additional Medicare IM given by:    Discharge Disposition:    Per UR Regulation:  Reviewed for med. necessity/level of care/duration of stay  If discussed at [place] of Stay Meetings, dates discussed:    Comments:  06/04/14 [place], MSN, CM- Medicare IM letter provided.

## 2014-06-04 NOTE — Progress Notes (Signed)
Physical Therapy Treatment Patient Details Name: [name] MRN: [id number] DOB: 1940/12/24 Today's Date: 06/04/2014    History of Present Illness Pt s/p l4-5 laminectomies and foraminotomies via Dr Kristeen Mans.     PT Comments    Pt with decr memory for back precautions and techniques for safe use of RW (?due to meds vs her baseline). Requires constant cues for upright posture to prevent bending forward. Walks very slowly, but is steady with use of RW.  Follow Up Recommendations  SNF     Equipment Recommendations  None recommended by PT    Recommendations for Other Services       Precautions / Restrictions Precautions Precautions: Fall;Back Precaution Booklet Issued: Yes (comment) Precaution Comments: pt unable to recall any back precautions Required Braces or Orthoses: Spinal Brace Spinal Brace: Lumbar corset;Applied in sitting position Restrictions Weight Bearing Restrictions: No    Mobility  Bed Mobility Overal bed mobility: Needs Assistance Bed Mobility: Sit to Sidelying;Rolling;Sidelying to Sit Rolling: Min assist Sidelying to sit: Mod assist     Sit to sidelying: Mod assist General bed mobility comments: vc for technique to maintain back precautions; assist to bend knees with rolling and with legs on/off the bed  Transfers Overall transfer level: Needs assistance Equipment used: Rolling walker (2 wheeled) Transfers: Sit to/from Stand Sit to Stand: Min assist         General transfer comment: x 2; vc for safe use of RW; steady assist due to imbalance when transitioning hands to/from RW  Ambulation/Gait Ambulation/Gait assistance: Min assist Ambulation Distance (Feet): 110 Feet Assistive device: Rolling walker (2 wheeled) Gait Pattern/deviations: Step-through pattern;Decreased stride length;Trunk flexed Gait velocity: very slow Gait velocity interpretation: Below normal speed for age/gender General Gait Details: constant verbal and tactile cues to  facilitate upright posture; vc for safe use of RW and to facilitate upright posture; pt denies pain and states she walks very slow to avoid pain   Stairs            Wheelchair Mobility    Modified Rankin (Stroke Patients Only)       Balance                                    Cognition Arousal/Alertness: Awake/alert Behavior During Therapy: WFL for tasks assessed/performed Overall Cognitive Status: No family/caregiver present to determine baseline cognitive functioning Area of Impairment: Memory     Memory: Decreased recall of precautions              Exercises      General Comments        Pertinent Vitals/Pain Pain Assessment: No/denies pain    Home Living                      Prior Function            PT Goals (current goals can now be found in the care plan section) Acute Rehab PT Goals Patient Stated Goal: To get to feeling better and moving around Progress towards PT goals: Progressing toward goals    Frequency  Min 5X/week    PT Plan Current plan remains appropriate    Co-evaluation             End of Session Equipment Utilized During Treatment: Gait belt;Back brace Activity Tolerance: Patient limited by fatigue Patient left: in bed;with call bell/phone within reach;with bed alarm set  Time: 1216-2446 PT Time Calculation (min) (ACUTE ONLY): 32 min  Charges:  $Gait Training: 23-37 mins                    G Codes:      Julius Matus 06-22-14, 11:56 AM Pager [phone]

## 2014-06-04 NOTE — Progress Notes (Signed)
Patient ID: [name], female   DOB: [birth year], 74 y.o.   MRN: [id number] Patient to be dc in am  To a nursing home.

## 2014-06-04 NOTE — Clinical Social Work Psychosocial (Signed)
Clinical Social Work Department BRIEF PSYCHOSOCIAL ASSESSMENT 06/04/2014  Patient:  [name],[name]     Account Number:  [id number]     Admit date:  06/02/2014  Clinical Social Worker:  Glendon Axe, CLINICAL SOCIAL WORKER  Date/Time:  06/04/2014 11:56 AM  Referred by:  Physician  Date Referred:  06/04/2014 Referred for  SNF Placement   Other Referral:   Interview type:  Other - See comment Other interview type:   CSW met with patient and pt's dtr, Danielle present at bedside.    PSYCHOSOCIAL DATA Living Status:  ALONE Admitted from facility:  n/a Level of care:  n/a Primary support name:  [place] Primary support relationship to patient:  CHILD, ADULT Degree of support available:   Strong    CURRENT CONCERNS Current Concerns  Post-Acute Placement   Other Concerns:    SOCIAL WORK ASSESSMENT / PLAN Clinical Social Worker met with patient and pt's dtr in reference to post-acute placement for SNF. CSW introduced CSW role and SNF process. CSW also reviewed and provided SNF list. Pt reported immediately that she would like placement at [facility]. Pt stated she was a resident at [facility] about 2 years ago and shared her positive experience. Pt's dtr agreeable with pt's decision. CSW to complete FL-2 and submit clinicals. CSW will continue to follow pt and pt's family for continued support and to facilitate pt's discharge once medically stable.   Assessment/plan status:  Psychosocial Support/Ongoing Assessment of Needs Other assessment/ plan:   To complete FL-2 and place on chart for MD signature.   Information/referral to community resources:   SNF information/list provided.    PATIENT'S/FAMILY'S RESPONSE TO PLAN OF CARE: Pt sitting up in bed alert and oriented x4. Pt and pt's dtr smiling and laughing during assessment. Pt and pt's dtr agreeable to SNF placement at [facility] and asked several questions in reference to [facility] related facilities. Pt and  pt's dtr pleasant and appreciated social work intervention.    Glendon Axe, MSW, LCSWA [phone] 06/04/2014 12:15 PM

## 2014-06-04 NOTE — Clinical Social Work Placement (Signed)
Clinical Social Work Department CLINICAL SOCIAL WORK PLACEMENT NOTE 06/04/2014  Patient:  [name],[name]  Account Number:  [id number] Admit date:  06/02/2014  Clinical Social Worker:  Glendon Axe, CLINICAL SOCIAL WORKER  Date/time:  06/04/2014 12:15 PM  Clinical Social Work is seeking post-discharge placement for this patient at the following level of care:   SKILLED NURSING   (*CSW will update this form in Epic as items are completed)   06/04/2014  Patient/family provided with [place] Department of Clinical Social Work's list of facilities offering this level of care within the geographic area requested by the patient (or if unable, by the patient's family).  06/04/2014  Patient/family informed of their freedom to choose among providers that offer the needed level of care, that participate in Medicare, Medicaid or managed care program needed by the patient, have an available bed and are willing to accept the patient.  06/04/2014  Patient/family informed of [facility]' ownership interest in [facility], as well as of the fact that they are under no obligation to receive care at this facility.  PASARR submitted to EDS on EXISTING  PASARR number received on EXISTING   FL2 transmitted to all facilities in geographic area requested by pt/family on  06/04/2014 FL2 transmitted to all facilities within larger geographic area on   Patient informed that his/her managed care company has contracts with or will negotiate with  certain facilities, including the following:   YES     Patient/family informed of bed offers received:   Patient chooses bed at  Physician recommends and patient chooses bed at    Patient to be transferred to  on   Patient to be transferred to facility by  Patient and family notified of transfer on  Name of family member notified:    The following physician request were entered in Epic:   Additional Comments:   Glendon Axe, MSW,  LCSWA [phone] 06/04/2014 12:16 PM

## 2014-06-05 LAB — URINALYSIS, ROUTINE W REFLEX MICROSCOPIC
Bilirubin Urine: NEGATIVE
Glucose, UA: NEGATIVE mg/dL
Hgb urine dipstick: NEGATIVE
Ketones, ur: NEGATIVE mg/dL
Leukocytes, UA: NEGATIVE
Nitrite: NEGATIVE
Protein, ur: NEGATIVE mg/dL
Specific Gravity, Urine: 1.009 (ref 1.005–1.030)
Urobilinogen, UA: 0.2 mg/dL (ref 0.0–1.0)
pH: 7 (ref 5.0–8.0)

## 2014-06-05 MED ORDER — POLYETHYLENE GLYCOL 3350 17 G PO PACK
17.0000 g | PACK | Freq: Every day | ORAL | Status: DC | PRN
  Administered 2014-06-05: 17 g via ORAL
  Filled 2014-06-05: qty 1

## 2014-06-05 MED ORDER — DOCUSATE SODIUM 100 MG PO CAPS
100.0000 mg | ORAL_CAPSULE | Freq: Two times a day (BID) | ORAL | Status: DC | PRN
  Administered 2014-06-05 – 2014-06-06 (×2): 100 mg via ORAL
  Filled 2014-06-05 (×2): qty 1

## 2014-06-05 MED ORDER — SENNA 8.6 MG PO TABS: 1.0000 | ORAL_TABLET | Freq: Two times a day (BID) | ORAL | Status: DC | PRN

## 2014-06-06 MED ORDER — BISACODYL 10 MG RE SUPP
10.0000 mg | Freq: Every day | RECTAL | Status: DC | PRN
  Filled 2014-06-06: qty 1

## 2014-06-06 NOTE — Progress Notes (Signed)
Patient discharged via ambulance to SNF.

## 2014-06-06 NOTE — Progress Notes (Signed)
Filed Vitals:   06/05/14 2155 06/06/14 0245 06/06/14 0300 06/06/14 0643  BP:  157/72  123/79  Pulse:  78  81  Temp:  98.6 F (37 C)  97.9 F (36.6 C)  TempSrc:  Oral  Oral  Resp:  16  18  Height:   5\' 3"  (1.6 m)   Weight:      SpO2: 96% 98%  100%    Patient resting in bed comfortably. Has been up and about in the room and to the bathroom. Dr. Joya Salm had anticipated transfer to SNF yesterday, but apparently there were no available beds, and transfer is pending. Seen by Dr. Ronnald Ramp yesterday.  Having some urinary frequency, urinalysis and urine culture sent, UA shows no leukocytes or nitrites.  Plan: Continuing PT and OT, anticipate transfer to SNF this week for rehabilitation. Dr. Saintclair Halsted to see in a.m.  Hosie Spangle, MD 06/06/2014, 9:43 AM

## 2014-06-06 NOTE — Social Work (Signed)
CSW facilitated discharge to [facility] and rehab. Patient may decide to transfer to another facility later. [facility] made aware. Patient and daughter agreeable to discharge plan. RN will call [facility] for transport once patient is ready.  Christene Lye MSW, [place]

## 2014-06-06 NOTE — Social Work (Signed)
CSW met with patient about discharge to another facility, potentially short-term for SNF treatment. Patient states she is willing but would like to discuss with her brother before she can make a final decision. CSW encouraged patient to contact brother in order to prepare for a discharge this afternoon.   CSW will follow through until [place] MSW, LCSW [phone]

## 2014-06-06 NOTE — Progress Notes (Signed)
Patient ready for discharge today to SNF; report called to receiving RN; discharge instructions reviewed with patient; copies sent with packet to the SNF; Rx in packet;  Patient stable; patient had relief from her bowels after suppository this afternoon; denies pain; ready for transport to [facility]. Ambulance transport is in route for transfer.

## 2014-06-06 NOTE — Progress Notes (Signed)
Filed Vitals:   06/06/14 0245 06/06/14 0300 06/06/14 0643 06/06/14 1028  BP: 157/72  123/79 112/63  Pulse: 78  81 83  Temp: 98.6 F (37 C)  97.9 F (36.6 C) 98.1 F (36.7 C)  TempSrc: Oral  Oral Oral  Resp: 16  18 18   Height:  5\' 3"  (1.6 m)    Weight:      SpO2: 98%  100% 99%    Apparently a SNF bed is becoming available today. Patient does want to be transferred. Her condition is stable, and unchanged from the information dictated into Dr. Harley Hallmark discharge summary. FL-2 signed by Dr. Joya Salm. Nursing staff reports signed prescription at bedside.  Plan: For transfer to SNF today.  Hosie Spangle, MD 06/06/2014, 11:58 AM

## 2014-06-07 ENCOUNTER — Telehealth: Payer: Self-pay | Admitting: Internal Medicine

## 2014-06-07 ENCOUNTER — Telehealth: Payer: Self-pay | Admitting: *Deleted

## 2014-06-07 LAB — URINE CULTURE
Colony Count: NO GROWTH
Culture: NO GROWTH

## 2014-06-07 NOTE — Telephone Encounter (Signed)
Wanted to notify provider that patient was admitted on the 6th of March to [facility] with Spinal Stenosis.

## 2014-06-07 NOTE — Telephone Encounter (Signed)
Pt was on TCM list d/c 06/06/14 sent to [facility] "lumbar decompression" she will f/u with Dr. Freada Bergeron in 3 wks...[name]

## 2014-06-08 NOTE — Telephone Encounter (Signed)
Noted - Dx in problem list in [facility] thanks

## 2014-07-07 ENCOUNTER — Other Ambulatory Visit (INDEPENDENT_AMBULATORY_CARE_PROVIDER_SITE_OTHER): Payer: Medicare HMO

## 2014-07-07 ENCOUNTER — Ambulatory Visit (INDEPENDENT_AMBULATORY_CARE_PROVIDER_SITE_OTHER): Payer: Medicare HMO | Admitting: Internal Medicine

## 2014-07-07 ENCOUNTER — Encounter: Payer: Self-pay | Admitting: Internal Medicine

## 2014-07-07 VITALS — BP 100/86 | HR 110 | Temp 97.5°F | Resp 15 | Ht 63.0 in | Wt 185.8 lb

## 2014-07-07 DIAGNOSIS — N39498 Other specified urinary incontinence: Secondary | ICD-10-CM

## 2014-07-07 DIAGNOSIS — R300 Dysuria: Secondary | ICD-10-CM

## 2014-07-07 DIAGNOSIS — R000 Tachycardia, unspecified: Secondary | ICD-10-CM | POA: Diagnosis not present

## 2014-07-07 DIAGNOSIS — R609 Edema, unspecified: Secondary | ICD-10-CM | POA: Diagnosis not present

## 2014-07-07 DIAGNOSIS — M4806 Spinal stenosis, lumbar region: Secondary | ICD-10-CM

## 2014-07-07 DIAGNOSIS — M48062 Spinal stenosis, lumbar region with neurogenic claudication: Secondary | ICD-10-CM

## 2014-07-07 LAB — URINALYSIS, ROUTINE W REFLEX MICROSCOPIC
Ketones, ur: NEGATIVE
Nitrite: NEGATIVE
Specific Gravity, Urine: 1.025
Total Protein, Urine: 30 — AB
Urine Glucose: NEGATIVE
Urobilinogen, UA: 0.2
pH: 5.5 (ref 5.0–8.0)

## 2014-07-07 NOTE — Patient Instructions (Signed)
  Your next office appointment will be determined based upon review of your pending labs Those instructions will be transmitted to you by mail for your records.  Critical results will be called.   Followup as needed for any active or acute issue. Please report any significant change in your symptoms. 

## 2014-07-07 NOTE — Progress Notes (Signed)
   Subjective:    Patient ID: [name], female    DOB: [birth year], 74 y.o.   MRN: [id number]  HPI She describes incontinence of urine almost constantly in the last 3 weeks since she's been in a nursing facility. This is associated with scant urine or oliguria. She has some dysuria as well.  On 06/02/14 she had lumbar laminectomy for spinal stenosis with neurogenic claudication. She states that this was done because of profound decreased range of motion of her back. She denied having pain.  Review of Systems  She denies hesitancy, frequency, hematuria, pyuria, flank pain, fever, chills, or sweats.  She has no numbness, tingling, weakness in the extremities.  As noted she has a tachycardia. Graves' disease  is listed as an active diagnosis as of 2013. She is on no thyroid medication. She is on propranolol. She does describe some anxiety without other thyroid related symptoms.      Objective:   Physical Exam  Pertinent or positive findings include: She has a slight tremor of her head.  Resting tachycardia is noted. Pulse was regular with rates 100-108. She had rare premature. DTRs are 0+ at the knees.  She has a slight tremor of hands  Pedal pulses are decreased, particularly the posterior tibial pulses.  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   General appearance :adequately nourished; in no distress. Eyes: No conjunctival inflammation or scleral icterus is present. Heart:   S1 and S2 normal without gallop, murmur, click, or rub. Lungs:Chest clear to auscultation; no wheezes, rhonchi,rales ,or rubs present.No increased work of breathing.  Abdomen: bowel sounds normal, soft and non-tender without masses, organomegaly or hernias noted.  No guarding or rebound. No flank tenderness to percussion. Vascular : all pulses equal ; no bruits present. Skin:Warm & dry.  Intact without suspicious lesions or rashes ; no tenting  Lymphatic: No lymphadenopathy is noted about the  head, neck, axilla Neuro: Strength, tone normal.        Assessment & Plan:  #1 urinary incontinence  #2 dysuria  #3 tachycardia with a past history of Graves' disease  #4 edema vs lipedema  #5 status post neurosurgery for spinal stenosis with claudication  Plan: See orders and recommendations

## 2014-07-07 NOTE — Progress Notes (Signed)
Pre visit review using our clinic review tool, if applicable. No additional management support is needed unless otherwise documented below in the visit note. 

## 2014-07-09 ENCOUNTER — Other Ambulatory Visit: Payer: Self-pay

## 2014-07-09 DIAGNOSIS — R300 Dysuria: Secondary | ICD-10-CM

## 2014-07-09 LAB — URINE CULTURE: Colony Count: >=100000

## 2014-07-09 MED ORDER — CIPROFLOXACIN HCL 500 MG PO TABS: 500.0000 mg | ORAL_TABLET | Freq: Two times a day (BID) | ORAL | Status: DC

## 2014-07-09 NOTE — Progress Notes (Signed)
Patient advised that RX for cipro has been called in to pharmacy, also pt needs to come back for repeat UA and Urine culture in 10 days

## 2014-08-17 ENCOUNTER — Ambulatory Visit (INDEPENDENT_AMBULATORY_CARE_PROVIDER_SITE_OTHER): Payer: Medicare HMO | Admitting: Internal Medicine

## 2014-08-17 ENCOUNTER — Other Ambulatory Visit (INDEPENDENT_AMBULATORY_CARE_PROVIDER_SITE_OTHER): Payer: Medicare HMO

## 2014-08-17 ENCOUNTER — Encounter: Payer: Self-pay | Admitting: Internal Medicine

## 2014-08-17 VITALS — BP 140/78 | HR 104 | Temp 98.0°F | Ht 63.0 in | Wt 186.2 lb

## 2014-08-17 DIAGNOSIS — E0500 Thyrotoxicosis with diffuse goiter without thyrotoxic crisis or storm: Secondary | ICD-10-CM

## 2014-08-17 DIAGNOSIS — Z23 Encounter for immunization: Secondary | ICD-10-CM | POA: Diagnosis not present

## 2014-08-17 DIAGNOSIS — Z0000 Encounter for general adult medical examination without abnormal findings: Secondary | ICD-10-CM | POA: Diagnosis not present

## 2014-08-17 DIAGNOSIS — E785 Hyperlipidemia, unspecified: Secondary | ICD-10-CM

## 2014-08-17 DIAGNOSIS — I10 Essential (primary) hypertension: Secondary | ICD-10-CM | POA: Diagnosis not present

## 2014-08-17 DIAGNOSIS — E669 Obesity, unspecified: Secondary | ICD-10-CM

## 2014-08-17 LAB — TSH: TSH: 5.17 u[IU]/mL — AB (ref 0.35–4.50)

## 2014-08-17 MED ORDER — ALBUTEROL SULFATE HFA 108 (90 BASE) MCG/ACT IN AERS: 1.0000 | INHALATION_SPRAY | Freq: Four times a day (QID) | RESPIRATORY_TRACT | Status: DC | PRN

## 2014-08-17 MED ORDER — OMEPRAZOLE 40 MG PO CPDR: 40.0000 mg | DELAYED_RELEASE_CAPSULE | Freq: Two times a day (BID) | ORAL | Status: DC

## 2014-08-17 MED ORDER — PROPRANOLOL HCL ER 120 MG PO CP24: 120.0000 mg | ORAL_CAPSULE | Freq: Every day | ORAL | Status: DC

## 2014-08-17 MED ORDER — BUSPIRONE HCL 10 MG PO TABS: 10.0000 mg | ORAL_TABLET | Freq: Three times a day (TID) | ORAL | Status: DC

## 2014-08-17 MED ORDER — ZOLPIDEM TARTRATE 5 MG PO TABS: 5.0000 mg | ORAL_TABLET | Freq: Every evening | ORAL | Status: DC | PRN

## 2014-08-17 MED ORDER — DIVALPROEX SODIUM ER 500 MG PO TB24: ORAL_TABLET | ORAL | Status: DC

## 2014-08-17 MED ORDER — MOMETASONE FURO-FORMOTEROL FUM 100-5 MCG/ACT IN AERO: 2.0000 | INHALATION_SPRAY | Freq: Two times a day (BID) | RESPIRATORY_TRACT | Status: DC

## 2014-08-17 MED ORDER — ATORVASTATIN CALCIUM 10 MG PO TABS: 10.0000 mg | ORAL_TABLET | Freq: Every day | ORAL | Status: DC

## 2014-08-17 MED ORDER — NORTRIPTYLINE HCL 25 MG PO CAPS: 25.0000 mg | ORAL_CAPSULE | Freq: Every evening | ORAL | Status: DC | PRN

## 2014-08-17 NOTE — Assessment & Plan Note (Signed)
Wt Readings from Last 3 Encounters:  08/17/14 186 lb 4 oz (84.482 kg)  07/07/14 185 lb 12 oz (84.256 kg)  06/02/14 189 lb (85.73 kg)   The patient is asked to make an attempt to improve diet and exercise patterns to aid in medical management of this problem.

## 2014-08-17 NOTE — Assessment & Plan Note (Signed)
rx'd statin - reports compliance related to $$$ Changed from simva to atrova fall 2015 - med list reconciled today Recheck annually and adjust as needed

## 2014-08-17 NOTE — Assessment & Plan Note (Signed)
Dx 2011 Follows with periodically endo - never on meds, no hx ablation ?symptomatic with chronic tremors - check TSH now Encouraged to continue propanolol and buspar (for anxiety component of tremor)
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
## 2014-08-17 NOTE — Patient Instructions (Addendum)
It was good to see you today.  We have reviewed your prior records including labs and tests today  Health Maintenance reviewed - Prevnar pneumonia vaccine updated today. All other recommended immunizations and age-appropriate screenings are up-to-date.  Test(s) ordered today. Your results will be released to [place] (or called to you) after review, usually within 72hours after test completion. If any changes need to be made, you will be notified at that same time.  Medications reviewed and updated, no changes recommended at this time. Your prescription(s) have been submitted to your pharmacy. Please take as directed and contact our office if you believe you are having problem(s) with the medication(s).  Please schedule followup in 12 months for annual exam and labs, call sooner if problems.  Health Maintenance Adopting a healthy lifestyle and getting preventive care can go a long way to promote health and wellness. Talk with your health care provider about what schedule of regular examinations is right for you. This is a good chance for you to check in with your provider about disease prevention and staying healthy. In between checkups, there are plenty of things you can do on your own. Experts have done a lot of research about which lifestyle changes and preventive measures are most likely to keep you healthy. Ask your health care provider for more information. WEIGHT AND DIET  Eat a healthy diet  Be sure to include plenty of vegetables, fruits, low-fat dairy products, and lean protein.  Do not eat a lot of foods high in solid fats, added sugars, or salt.  Get regular exercise. This is one of the most important things you can do for your health.  Most adults should exercise for at least 150 minutes each week. The exercise should increase your heart rate and make you sweat (moderate-intensity exercise).  Most adults should also do strengthening exercises at least twice a week. This is in  addition to the moderate-intensity exercise.  Maintain a healthy weight  Body mass index (BMI) is a measurement that can be used to identify possible weight problems. It estimates body fat based on height and weight. Your health care provider can help determine your BMI and help you achieve or maintain a healthy weight.  For females 44 years of age and older:   A BMI below 18.5 is considered underweight.  A BMI of 18.5 to 24.9 is normal.  A BMI of 25 to 29.9 is considered overweight.  A BMI of 30 and above is considered obese.  Watch levels of cholesterol and blood lipids  You should start having your blood tested for lipids and cholesterol at 73 years of age, then have this test every 5 years.  You may need to have your cholesterol levels checked more often if:  Your lipid or cholesterol levels are high.  You are older than 73 years of age.  You are at high risk for heart disease.  CANCER SCREENING   Lung Cancer  Lung cancer screening is recommended for adults 14-70 years old who are at high risk for lung cancer because of a history of smoking.  A yearly low-dose CT scan of the lungs is recommended for people who:  Currently smoke.  Have quit within the past 15 years.  Have at least a 30-pack-year history of smoking. A pack year is smoking an average of one pack of cigarettes a day for 1 year.  Yearly screening should continue until it has been 15 years since you quit.  Yearly screening should  stop if you develop a health problem that would prevent you from having lung cancer treatment.  Breast Cancer  Practice breast self-awareness. This means understanding how your breasts normally appear and feel.  It also means doing regular breast self-exams. Let your health care provider know about any changes, no matter how small.  If you are in your 20s or 30s, you should have a clinical breast exam (CBE) by a health care provider every 1-3 years as part of a regular  health exam.  If you are 48 or older, have a CBE every year. Also consider having a breast X-ray (mammogram) every year.  If you have a family history of breast cancer, talk to your health care provider about genetic screening.  If you are at high risk for breast cancer, talk to your health care provider about having an MRI and a mammogram every year.  Breast cancer gene (BRCA) assessment is recommended for women who have family members with BRCA-related cancers. BRCA-related cancers include:  Breast.  Ovarian.  Tubal.  Peritoneal cancers.  Results of the assessment will determine the need for genetic counseling and BRCA1 and BRCA2 testing. Cervical Cancer Routine pelvic examinations to screen for cervical cancer are no longer recommended for nonpregnant women who are considered low risk for cancer of the pelvic organs (ovaries, uterus, and vagina) and who do not have symptoms. A pelvic examination may be necessary if you have symptoms including those associated with pelvic infections. Ask your health care provider if a screening pelvic exam is right for you.   The Pap test is the screening test for cervical cancer for women who are considered at risk.  If you had a hysterectomy for a problem that was not cancer or a condition that could lead to cancer, then you no longer need Pap tests.  If you are older than 65 years, and you have had normal Pap tests for the past 10 years, you no longer need to have Pap tests.  If you have had past treatment for cervical cancer or a condition that could lead to cancer, you need Pap tests and screening for cancer for at least 20 years after your treatment.  If you no longer get a Pap test, assess your risk factors if they change (such as having a new sexual partner). This can affect whether you should start being screened again.  Some women have medical problems that increase their chance of getting cervical cancer. If this is the case for you, your  health care provider may recommend more frequent screening and Pap tests.  The human papillomavirus (HPV) test is another test that may be used for cervical cancer screening. The HPV test looks for the virus that can cause cell changes in the cervix. The cells collected during the Pap test can be tested for HPV.  The HPV test can be used to screen women 3 years of age and older. Getting tested for HPV can extend the interval between normal Pap tests from three to five years.  An HPV test also should be used to screen women of any age who have unclear Pap test results.  After 73 years of age, women should have HPV testing as often as Pap tests.  Colorectal Cancer  This type of cancer can be detected and often prevented.  Routine colorectal cancer screening usually begins at 73 years of age and continues through 73 years of age.  Your health care provider may recommend screening at an earlier age  if you have risk factors for colon cancer.  Your health care provider may also recommend using home test kits to check for hidden blood in the stool.  A small camera at the end of a tube can be used to examine your colon directly (sigmoidoscopy or colonoscopy). This is done to check for the earliest forms of colorectal cancer.  Routine screening usually begins at age 45.  Direct examination of the colon should be repeated every 5-10 years through 73 years of age. However, you may need to be screened more often if early forms of precancerous polyps or small growths are found. Skin Cancer  Check your skin from head to toe regularly.  Tell your health care provider about any new moles or changes in moles, especially if there is a change in a mole's shape or color.  Also tell your health care provider if you have a mole that is larger than the size of a pencil eraser.  Always use sunscreen. Apply sunscreen liberally and repeatedly throughout the day.  Protect yourself by wearing long sleeves,  pants, a wide-brimmed hat, and sunglasses whenever you are outside. HEART DISEASE, DIABETES, AND HIGH BLOOD PRESSURE   Have your blood pressure checked at least every 1-2 years. High blood pressure causes heart disease and increases the risk of stroke.  If you are between 40 years and 50 years old, ask your health care provider if you should take aspirin to prevent strokes.  Have regular diabetes screenings. This involves taking a blood sample to check your fasting blood sugar level.  If you are at a normal weight and have a low risk for diabetes, have this test once every three years after 73 years of age.  If you are overweight and have a high risk for diabetes, consider being tested at a younger age or more often. PREVENTING INFECTION  Hepatitis B  If you have a higher risk for hepatitis B, you should be screened for this virus. You are considered at high risk for hepatitis B if:  You were born in a country where hepatitis B is common. Ask your health care provider which countries are considered high risk.  Your parents were born in a high-risk country, and you have not been immunized against hepatitis B (hepatitis B vaccine).  You have HIV or AIDS.  You use needles to inject street drugs.  You live with someone who has hepatitis B.  You have had sex with someone who has hepatitis B.  You get hemodialysis treatment.  You take certain medicines for conditions, including cancer, organ transplantation, and autoimmune conditions. Hepatitis C  Blood testing is recommended for:  Everyone born from [birth year] through 1964.  Anyone with known risk factors for hepatitis C. Sexually transmitted infections (STIs)  You should be screened for sexually transmitted infections (STIs) including gonorrhea and chlamydia if:  You are sexually active and are younger than 73 years of age.  You are older than 73 years of age and your health care provider tells you that you are at risk for this  type of infection.  Your sexual activity has changed since you were last screened and you are at an increased risk for chlamydia or gonorrhea. Ask your health care provider if you are at risk.  If you do not have HIV, but are at risk, it may be recommended that you take a prescription medicine daily to prevent HIV infection. This is called pre-exposure prophylaxis (PrEP). You are considered at risk if:  You are sexually active and do not regularly use condoms or know the HIV status of your partner(s).  You take drugs by injection.  You are sexually active with a partner who has HIV. Talk with your health care provider about whether you are at high risk of being infected with HIV. If you choose to begin PrEP, you should first be tested for HIV. You should then be tested every 3 months for as long as you are taking PrEP.  PREGNANCY   If you are premenopausal and you may become pregnant, ask your health care provider about preconception counseling.  If you may become pregnant, take 400 to 800 micrograms (mcg) of folic acid every day.  If you want to prevent pregnancy, talk to your health care provider about birth control (contraception). OSTEOPOROSIS AND MENOPAUSE   Osteoporosis is a disease in which the bones lose minerals and strength with aging. This can result in serious bone fractures. Your risk for osteoporosis can be identified using a bone density scan.  If you are 59 years of age or older, or if you are at risk for osteoporosis and fractures, ask your health care provider if you should be screened.  Ask your health care provider whether you should take a calcium or vitamin D supplement to lower your risk for osteoporosis.  Menopause may have certain physical symptoms and risks.  Hormone replacement therapy may reduce some of these symptoms and risks. Talk to your health care provider about whether hormone replacement therapy is right for you.  HOME CARE INSTRUCTIONS   Schedule  regular health, dental, and eye exams.  Stay current with your immunizations.   Do not use any tobacco products including cigarettes, chewing tobacco, or electronic cigarettes.  If you are pregnant, do not drink alcohol.  If you are breastfeeding, limit how much and how often you drink alcohol.  Limit alcohol intake to no more than 1 drink per day for nonpregnant women. One drink equals 12 ounces of beer, 5 ounces of wine, or 1 ounces of hard liquor.  Do not use street drugs.  Do not share needles.  Ask your health care provider for help if you need support or information about quitting drugs.  Tell your health care provider if you often feel depressed.  Tell your health care provider if you have ever been abused or do not feel safe at home. Document Released: 10/02/2010 Document Revised: 08/03/2013 Document Reviewed: 02/18/2013 [facility] Patient Information 2015 [place], [place]. This information is not intended to replace advice given to you by your health care provider. Make sure you discuss any questions you have with your health care provider.

## 2014-08-17 NOTE — Assessment & Plan Note (Signed)
BP Readings from Last 3 Encounters:  08/17/14 140/78  07/07/14 100/86  06/06/14 106/50   The current medical regimen is generally effective;  continue present plan and medications.

## 2014-08-17 NOTE — Progress Notes (Signed)
Pre visit review using our clinic review tool, if applicable. No additional management support is needed unless otherwise documented below in the visit note. 

## 2014-08-20 ENCOUNTER — Inpatient Hospital Stay (HOSPITAL_BASED_OUTPATIENT_CLINIC_OR_DEPARTMENT_OTHER)
Admission: EM | Admit: 2014-08-20 | Discharge: 2014-08-22 | DRG: 175 | Disposition: A | Discharge status: Skilled Nursing Facility | Payer: Medicare HMO | Attending: Internal Medicine | Admitting: Internal Medicine

## 2014-08-20 ENCOUNTER — Emergency Department (HOSPITAL_BASED_OUTPATIENT_CLINIC_OR_DEPARTMENT_OTHER): Payer: Medicare HMO

## 2014-08-20 ENCOUNTER — Encounter (HOSPITAL_BASED_OUTPATIENT_CLINIC_OR_DEPARTMENT_OTHER): Payer: Self-pay

## 2014-08-20 DIAGNOSIS — G40909 Epilepsy, unspecified, not intractable, without status epilepticus: Secondary | ICD-10-CM | POA: Diagnosis present

## 2014-08-20 DIAGNOSIS — R0602 Shortness of breath: Secondary | ICD-10-CM | POA: Diagnosis present

## 2014-08-20 DIAGNOSIS — G250 Essential tremor: Secondary | ICD-10-CM | POA: Diagnosis present

## 2014-08-20 DIAGNOSIS — F329 Major depressive disorder, single episode, unspecified: Secondary | ICD-10-CM | POA: Diagnosis present

## 2014-08-20 DIAGNOSIS — Z9851 Tubal ligation status: Secondary | ICD-10-CM | POA: Diagnosis not present

## 2014-08-20 DIAGNOSIS — E0500 Thyrotoxicosis with diffuse goiter without thyrotoxic crisis or storm: Secondary | ICD-10-CM | POA: Diagnosis present

## 2014-08-20 DIAGNOSIS — I10 Essential (primary) hypertension: Secondary | ICD-10-CM | POA: Diagnosis present

## 2014-08-20 DIAGNOSIS — J45909 Unspecified asthma, uncomplicated: Secondary | ICD-10-CM | POA: Diagnosis present

## 2014-08-20 DIAGNOSIS — G43909 Migraine, unspecified, not intractable, without status migrainosus: Secondary | ICD-10-CM | POA: Diagnosis present

## 2014-08-20 DIAGNOSIS — G4733 Obstructive sleep apnea (adult) (pediatric): Secondary | ICD-10-CM | POA: Diagnosis present

## 2014-08-20 DIAGNOSIS — I2699 Other pulmonary embolism without acute cor pulmonale: Secondary | ICD-10-CM | POA: Diagnosis not present

## 2014-08-20 DIAGNOSIS — E785 Hyperlipidemia, unspecified: Secondary | ICD-10-CM | POA: Diagnosis not present

## 2014-08-20 DIAGNOSIS — J189 Pneumonia, unspecified organism: Secondary | ICD-10-CM | POA: Diagnosis present

## 2014-08-20 DIAGNOSIS — F419 Anxiety disorder, unspecified: Secondary | ICD-10-CM | POA: Diagnosis present

## 2014-08-20 DIAGNOSIS — K219 Gastro-esophageal reflux disease without esophagitis: Secondary | ICD-10-CM | POA: Diagnosis present

## 2014-08-20 DIAGNOSIS — Z96651 Presence of right artificial knee joint: Secondary | ICD-10-CM | POA: Diagnosis present

## 2014-08-20 DIAGNOSIS — M179 Osteoarthritis of knee, unspecified: Secondary | ICD-10-CM | POA: Diagnosis present

## 2014-08-20 DIAGNOSIS — G4700 Insomnia, unspecified: Secondary | ICD-10-CM | POA: Diagnosis present

## 2014-08-20 DIAGNOSIS — Z86711 Personal history of pulmonary embolism: Secondary | ICD-10-CM | POA: Diagnosis present

## 2014-08-20 DIAGNOSIS — R569 Unspecified convulsions: Secondary | ICD-10-CM

## 2014-08-20 LAB — URINALYSIS, ROUTINE W REFLEX MICROSCOPIC
BILIRUBIN URINE: NEGATIVE
GLUCOSE, UA: NEGATIVE mg/dL
HGB URINE DIPSTICK: NEGATIVE
Ketones, ur: 15 mg/dL — AB
LEUKOCYTES UA: NEGATIVE
Nitrite: NEGATIVE
Protein, ur: NEGATIVE mg/dL
Specific Gravity, Urine: 1.021 (ref 1.005–1.030)
UROBILINOGEN UA: 1 mg/dL (ref 0.0–1.0)
pH: 6 (ref 5.0–8.0)

## 2014-08-20 LAB — BASIC METABOLIC PANEL
Anion gap: 8 (ref 5–15)
BUN: 17 mg/dL (ref 6–20)
CO2: 27 mmol/L (ref 22–32)
Calcium: 9.2 mg/dL (ref 8.9–10.3)
Chloride: 106 mmol/L (ref 101–111)
Creatinine, Ser: 1.09 mg/dL — ABNORMAL HIGH (ref 0.44–1.00)
GFR, EST AFRICAN AMERICAN: 57 mL/min — AB (ref >60)
GFR, EST NON AFRICAN AMERICAN: 49 mL/min — AB (ref >60)
GLUCOSE: 101 mg/dL — AB (ref 65–99)
Potassium: 4.6 mmol/L (ref 3.5–5.1)
SODIUM: 141 mmol/L (ref 135–145)

## 2014-08-20 LAB — D-DIMER, QUANTITATIVE: D-Dimer, Quant: 1.14 ug/mL-FEU — ABNORMAL HIGH (ref 0.00–0.48)

## 2014-08-20 LAB — CBC WITH DIFFERENTIAL/PLATELET
BASOS PCT: 0 % (ref 0–1)
Basophils Absolute: 0 10*3/uL (ref 0.0–0.1)
EOS PCT: 1 % (ref 0–5)
Eosinophils Absolute: 0.1 10*3/uL (ref 0.0–0.7)
HEMATOCRIT: 41.7 % (ref 36.0–46.0)
Hemoglobin: 13.5 g/dL (ref 12.0–15.0)
Lymphocytes Relative: 39 % (ref 12–46)
Lymphs Abs: 2.8 10*3/uL (ref 0.7–4.0)
MCH: 29.3 pg (ref 26.0–34.0)
MCHC: 32.4 g/dL (ref 30.0–36.0)
MCV: 90.5 fL (ref 78.0–100.0)
MONO ABS: 1 10*3/uL (ref 0.1–1.0)
Monocytes Relative: 13 % — ABNORMAL HIGH (ref 3–12)
NEUTROS ABS: 3.4 10*3/uL (ref 1.7–7.7)
Neutrophils Relative %: 47 % (ref 43–77)
Platelets: 166 10*3/uL (ref 150–400)
RBC: 4.61 MIL/uL (ref 3.87–5.11)
RDW: 14.6 % (ref 11.5–15.5)
WBC: 7.3 10*3/uL (ref 4.0–10.5)

## 2014-08-20 LAB — I-STAT CG4 LACTIC ACID, ED: LACTIC ACID, VENOUS: 1.57 mmol/L (ref 0.5–2.0)

## 2014-08-20 LAB — TROPONIN I: Troponin I: <0.03 ng/mL (ref <0.031)

## 2014-08-20 MED ORDER — NORTRIPTYLINE HCL 25 MG PO CAPS: 25.0000 mg | ORAL_CAPSULE | Freq: Every evening | ORAL | Status: DC | PRN

## 2014-08-20 MED ORDER — ZOLPIDEM TARTRATE 5 MG PO TABS
5.0000 mg | ORAL_TABLET | Freq: Every evening | ORAL | Status: DC | PRN
  Administered 2014-08-20: 5 mg via ORAL
  Filled 2014-08-20: qty 1

## 2014-08-20 MED ORDER — LORATADINE 10 MG PO TABS: 10.0000 mg | ORAL_TABLET | Freq: Every day | ORAL | Status: DC | PRN

## 2014-08-20 MED ORDER — HYDROMORPHONE HCL 1 MG/ML IJ SOLN
0.5000 mg | INTRAMUSCULAR | Status: DC | PRN
  Administered 2014-08-21 (×3): 1 mg via INTRAVENOUS
  Filled 2014-08-20 (×3): qty 1

## 2014-08-20 MED ORDER — ACETAMINOPHEN 325 MG PO TABS: 650.0000 mg | ORAL_TABLET | Freq: Four times a day (QID) | ORAL | Status: DC | PRN

## 2014-08-20 MED ORDER — SODIUM CHLORIDE 0.9 % IJ SOLN
3.0000 mL | Freq: Two times a day (BID) | INTRAMUSCULAR | Status: DC
  Administered 2014-08-22: 3 mL via INTRAVENOUS

## 2014-08-20 MED ORDER — BUSPIRONE HCL 10 MG PO TABS
10.0000 mg | ORAL_TABLET | Freq: Three times a day (TID) | ORAL | Status: DC
  Administered 2014-08-20 – 2014-08-22 (×4): 10 mg via ORAL
  Filled 2014-08-20 (×7): qty 1

## 2014-08-20 MED ORDER — ONDANSETRON HCL 4 MG/2ML IJ SOLN
4.0000 mg | Freq: Four times a day (QID) | INTRAMUSCULAR | Status: DC | PRN
  Administered 2014-08-21 (×2): 4 mg via INTRAVENOUS
  Filled 2014-08-20 (×2): qty 2

## 2014-08-20 MED ORDER — IPRATROPIUM-ALBUTEROL 0.5-2.5 (3) MG/3ML IN SOLN: 3.0000 mL | RESPIRATORY_TRACT | Status: DC | PRN

## 2014-08-20 MED ORDER — ALUM & MAG HYDROXIDE-SIMETH 200-200-20 MG/5ML PO SUSP: 30.0000 mL | Freq: Four times a day (QID) | ORAL | Status: DC | PRN

## 2014-08-20 MED ORDER — IOHEXOL 350 MG/ML SOLN
100.0000 mL | Freq: Once | INTRAVENOUS | Status: AC | PRN
  Administered 2014-08-20: 100 mL via INTRAVENOUS

## 2014-08-20 MED ORDER — SODIUM CHLORIDE 0.9 % IJ SOLN
3.0000 mL | Freq: Two times a day (BID) | INTRAMUSCULAR | Status: DC
  Administered 2014-08-21 – 2014-08-22 (×2): 3 mL via INTRAVENOUS

## 2014-08-20 MED ORDER — ONDANSETRON HCL 4 MG PO TABS: 4.0000 mg | ORAL_TABLET | Freq: Four times a day (QID) | ORAL | Status: DC | PRN

## 2014-08-20 MED ORDER — HEPARIN (PORCINE) IN NACL 100-0.45 UNIT/ML-% IJ SOLN
1150.0000 [IU]/h | INTRAMUSCULAR | Status: AC
  Administered 2014-08-20: 1200 [IU]/h via INTRAVENOUS
  Administered 2014-08-21: 1100 [IU]/h via INTRAVENOUS
  Filled 2014-08-20 (×3): qty 250

## 2014-08-20 MED ORDER — OXYCODONE HCL 5 MG PO TABS
5.0000 mg | ORAL_TABLET | ORAL | Status: DC | PRN
  Administered 2014-08-21 – 2014-08-22 (×2): 5 mg via ORAL
  Filled 2014-08-20 (×2): qty 1

## 2014-08-20 MED ORDER — HYDROCODONE-ACETAMINOPHEN 5-325 MG PO TABS
1.0000 | ORAL_TABLET | Freq: Once | ORAL | Status: AC
  Administered 2014-08-20: 1 via ORAL
  Filled 2014-08-20: qty 1

## 2014-08-20 MED ORDER — ACETAMINOPHEN 650 MG RE SUPP: 650.0000 mg | Freq: Four times a day (QID) | RECTAL | Status: DC | PRN

## 2014-08-20 MED ORDER — DIVALPROEX SODIUM ER 500 MG PO TB24
500.0000 mg | ORAL_TABLET | Freq: Every day | ORAL | Status: DC
  Administered 2014-08-20 – 2014-08-22 (×3): 500 mg via ORAL
  Filled 2014-08-20 (×3): qty 1

## 2014-08-20 MED ORDER — SODIUM CHLORIDE 0.9 % IV SOLN: 250.0000 mL | INTRAVENOUS | Status: DC | PRN

## 2014-08-20 MED ORDER — LEVOFLOXACIN IN D5W 750 MG/150ML IV SOLN
750.0000 mg | INTRAVENOUS | Status: DC
  Administered 2014-08-21: 750 mg via INTRAVENOUS
  Filled 2014-08-20 (×2): qty 150

## 2014-08-20 MED ORDER — SODIUM CHLORIDE 0.9 % IV SOLN
INTRAVENOUS | Status: AC
  Administered 2014-08-20: 20:00:00 via INTRAVENOUS

## 2014-08-20 MED ORDER — LEVOFLOXACIN IN D5W 500 MG/100ML IV SOLN
500.0000 mg | Freq: Once | INTRAVENOUS | Status: AC
  Administered 2014-08-20: 500 mg via INTRAVENOUS
  Filled 2014-08-20: qty 100

## 2014-08-20 MED ORDER — HYDROCHLOROTHIAZIDE 12.5 MG PO CAPS
12.5000 mg | ORAL_CAPSULE | Freq: Every day | ORAL | Status: DC
  Administered 2014-08-20 – 2014-08-21 (×2): 12.5 mg via ORAL
  Filled 2014-08-20 (×2): qty 1

## 2014-08-20 MED ORDER — ATORVASTATIN CALCIUM 10 MG PO TABS
10.0000 mg | ORAL_TABLET | Freq: Every day | ORAL | Status: DC
  Administered 2014-08-20 – 2014-08-22 (×3): 10 mg via ORAL
  Filled 2014-08-20 (×3): qty 1

## 2014-08-20 MED ORDER — HEPARIN BOLUS VIA INFUSION
4000.0000 [IU] | Freq: Once | INTRAVENOUS | Status: AC
  Administered 2014-08-20: 4000 [IU] via INTRAVENOUS

## 2014-08-20 MED ORDER — SODIUM CHLORIDE 0.9 % IV BOLUS (SEPSIS)
1000.0000 mL | Freq: Once | INTRAVENOUS | Status: AC
  Administered 2014-08-20: 1000 mL via INTRAVENOUS

## 2014-08-20 MED ORDER — PANTOPRAZOLE SODIUM 40 MG PO TBEC
40.0000 mg | DELAYED_RELEASE_TABLET | Freq: Every day | ORAL | Status: DC
  Administered 2014-08-20 – 2014-08-21 (×2): 40 mg via ORAL
  Filled 2014-08-20 (×2): qty 1

## 2014-08-20 MED ORDER — SODIUM CHLORIDE 0.9 % IJ SOLN: 3.0000 mL | INTRAMUSCULAR | Status: DC | PRN

## 2014-08-20 MED ORDER — LOSARTAN POTASSIUM-HCTZ 50-12.5 MG PO TABS: 1.0000 | ORAL_TABLET | Freq: Every day | ORAL | Status: DC

## 2014-08-20 MED ORDER — PROPRANOLOL HCL ER 120 MG PO CP24
120.0000 mg | ORAL_CAPSULE | Freq: Every day | ORAL | Status: DC
  Administered 2014-08-20 – 2014-08-22 (×3): 120 mg via ORAL
  Filled 2014-08-20 (×3): qty 1

## 2014-08-20 MED ORDER — LOSARTAN POTASSIUM 50 MG PO TABS
50.0000 mg | ORAL_TABLET | Freq: Every day | ORAL | Status: DC
  Administered 2014-08-20 – 2014-08-21 (×2): 50 mg via ORAL
  Filled 2014-08-20 (×2): qty 1

## 2014-08-20 NOTE — ED Notes (Signed)
Pt reports upper back pain since yesterday denies falling - does report chronic pain since surgery to area one month ago. Denies numbness/tingling, reports incontinence since surgery.

## 2014-08-20 NOTE — ED Notes (Addendum)
Per policy, Blood cultures not drawn as IV antibiotics already infusing.  EDP and unit RN notified by Amy, RN during report call.

## 2014-08-20 NOTE — ED Notes (Signed)
Ambulated patient to nurses station O2 level stayed at 99-100% pulse ranged from 100-117

## 2014-08-20 NOTE — ED Notes (Signed)
Heparin dosed by main pharmacy at [facility].

## 2014-08-20 NOTE — ED Notes (Signed)
Patient transported to X-ray 

## 2014-08-20 NOTE — ED Notes (Signed)
Insulin gtt verified with primary RN.
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
## 2014-08-20 NOTE — Progress Notes (Signed)
[facility] Brief Accept Note  [name] is a 74yo woman with PMH of GERD, Asthma, HTN, anxiety, lumbar stenosis with claudication who underwent back surgery on 06/02/14.  Presented with chest pain and SOB.  Found to have PE and PNA on CT scan of the chest.  She continues to be tachycardic and SOB with ambulation, but otherwise is hemodynamically stable per report.  Accepted for transfer to [facility] hospital telemetry bed.  Gilles Chiquito, MD
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
## 2014-08-21 LAB — HEPARIN LEVEL (UNFRACTIONATED)
HEPARIN UNFRACTIONATED: 0.28 [IU]/mL — AB (ref 0.30–0.70)
Heparin Unfractionated: 0.85 IU/mL — ABNORMAL HIGH (ref 0.30–0.70)
Heparin Unfractionated: 0.59 IU/mL (ref 0.30–0.70)

## 2014-08-21 LAB — CBC
HCT: 38 % (ref 36.0–46.0)
HEMOGLOBIN: 12.3 g/dL (ref 12.0–15.0)
MCH: 29.1 pg (ref 26.0–34.0)
MCHC: 32.4 g/dL (ref 30.0–36.0)
MCV: 90 fL (ref 78.0–100.0)
Platelets: 161 10*3/uL (ref 150–400)
RBC: 4.22 MIL/uL (ref 3.87–5.11)
RDW: 14.6 % (ref 11.5–15.5)
WBC: 8.1 10*3/uL (ref 4.0–10.5)

## 2014-08-21 LAB — BASIC METABOLIC PANEL
Anion gap: 7 (ref 5–15)
BUN: 12 mg/dL (ref 6–20)
CO2: 27 mmol/L (ref 22–32)
Calcium: 8.5 mg/dL — ABNORMAL LOW (ref 8.9–10.3)
Chloride: 105 mmol/L (ref 101–111)
Creatinine, Ser: 1.07 mg/dL — ABNORMAL HIGH (ref 0.44–1.00)
GFR calc non Af Amer: 51 mL/min — ABNORMAL LOW (ref >60)
GFR, EST AFRICAN AMERICAN: 59 mL/min — AB (ref >60)
Glucose, Bld: 100 mg/dL — ABNORMAL HIGH (ref 65–99)
Potassium: 3.8 mmol/L (ref 3.5–5.1)
SODIUM: 139 mmol/L (ref 135–145)

## 2014-08-21 LAB — T4, FREE: Free T4: 0.99 ng/dL (ref 0.61–1.12)

## 2014-08-21 LAB — VALPROIC ACID LEVEL: Valproic Acid Lvl: 54 ug/mL (ref 50.0–100.0)

## 2014-08-21 LAB — TSH: TSH: 4.536 u[IU]/mL — ABNORMAL HIGH (ref 0.350–4.500)

## 2014-08-21 MED ORDER — APIXABAN 5 MG PO TABS
10.0000 mg | ORAL_TABLET | Freq: Two times a day (BID) | ORAL | Status: DC
  Administered 2014-08-22: 10 mg via ORAL
  Filled 2014-08-21 (×2): qty 2

## 2014-08-21 MED ORDER — LOSARTAN POTASSIUM 50 MG PO TABS
100.0000 mg | ORAL_TABLET | Freq: Every day | ORAL | Status: DC
  Administered 2014-08-22: 100 mg via ORAL
  Filled 2014-08-21: qty 2

## 2014-08-21 MED ORDER — HYDROCHLOROTHIAZIDE 12.5 MG PO CAPS
12.5000 mg | ORAL_CAPSULE | Freq: Every day | ORAL | Status: DC
  Administered 2014-08-22: 12.5 mg via ORAL
  Filled 2014-08-21: qty 1

## 2014-08-21 MED ORDER — APIXABAN 5 MG PO TABS: 5.0000 mg | ORAL_TABLET | Freq: Two times a day (BID) | ORAL | Status: DC

## 2014-08-21 NOTE — Progress Notes (Signed)
ANTICOAGULATION CONSULT NOTE - Follow Up Consult  Pharmacy Consult for heparin Indication: pulmonary embolus  Labs:  Recent Labs  08/20/14 1715 08/21/14 0238  HGB 13.5 12.3  HCT 41.7 38.0  PLT 166 161  HEPARINUNFRC  --  0.85*  CREATININE 1.09* 1.07*  TROPONINI <0.03  --     Assessment: 73yo female supratherapeutic on heparin with initial dosing for PE.  Goal of Therapy:  Heparin level 0.3-0.7 units/ml   Plan:  Will decrease heparin gtt by 1-2 units/kg/hr to 1100 units/hr and check level in [place], PharmD, BCPS  08/21/2014,3:48 AM

## 2014-08-21 NOTE — Progress Notes (Addendum)
ANTICOAGULATION CONSULT NOTE - Follow Up Consult  Pharmacy Consult for heparin Indication: pulmonary embolus  Labs:  Recent Labs  08/20/14 1715 08/21/14 0238 08/21/14 1148  HGB 13.5 12.3  --   HCT 41.7 38.0  --   PLT 166 161  --   HEPARINUNFRC  --  0.85* 0.59  CREATININE 1.09* 1.07*  --   TROPONINI <0.03  --   --     Assessment: 74yo female with subacute PE on IV heparin, heparin leve (0.59) is therapeutic on 1100 units/hr, will transition to Eliquis tomorrow AM. CBC stable. No bleeding noted per chart. Scr 1.07, est. crcl b~ 50 ml/min.  Goal of Therapy:  Heparin level 0.3-0.7 units/ml   Plan:  - Continue heparin infusion 1100 units/hr today - Confirmatory heparin level at 1930 - D/c IV heparin tomorrow at 1000 - Then start Eliquis 10mg  bid x 7 days and transition to 5mg  bid on 5/29 - Monitor CBC  [name], PharmD, BCPS  Clinical Pharmacist  Pager: [phone]   08/21/2014,3:04 PM   Addendum: Recheck heparin level 0.28 slightly subtherapeutic   Plan: Increase heparin rate slightly to 1150 units/hr Switch to eliquis in AM

## 2014-08-21 NOTE — Progress Notes (Signed)
[facility] PROGRESS NOTE  [name] GYI:[id number] DOB: 03-14-1941 DOA: 08/20/2014 PCP: Cassidy Grant, MD  Assessment/Plan: Principal Problem:   Pulmonary embolism Active Problems:   Graves' disease   Dyslipidemia   HTN (hypertension)   Seizure   CAP (community acquired pneumonia)   Benign essential tremor   Intrinsic asthma     Subacute pulmonary embolism Currently on a heparin drip, likely developed in the setting of her recent back surgery Continue heparin drip for now, discussed several different options for long-term anticoagulation, patient is agreeable to Eliquis Will initiate Eliquis tomorrow and use a home No history of GI bleeding  Community-acquired pneumonia Continue Levaquin Switch  to by mouth in a.m.  History of Graves' disease, TSH high normal, check free T4  Dyslipidemia continue atorvastatin  Hypertension continue Cozaar and hydrochlorothiazide, will increase dose of Cozaar to 100 mg a day. Patient is also taking propranolol  History of asthma. The patient on when necessary Xopenex  Seizure disorder continue Depakote check Depakote level   Code Status: full Family Communication: family updated about patient's clinical progress Disposition Plan: PT/OT eval, anticipate discharge tomorrow   Brief narrative: [name] is a 74 y.o. female with a history of HTN, Graves Disease, Hyperlipidemia who resents to the ED with complaints of SOB and DOE and Pleurtic Chest pain x 2-3 days. She denies having fevers or chills. She also reports that her cough is nonproductive and her chest hurts when she coughs or takes a deep breath. She denies having hemoptysis. She was evaluated at the [facility] ED and was found to have a Pulmonary Embolism and RUL pneumonia on CTA of the Chest and Chest X-ray. She was started on an IV Heparin drip and administered IV Levaquin and transferred for admission to [facility].    Consultants:  None  Procedures:  None  Antibiotics: Levaquin  HPI/Subjective: Complaining of feeling tired, no shortness of breath, hypertensive  Objective: Filed Vitals:   08/20/14 2225 08/21/14 0211 08/21/14 0334 08/21/14 1037  BP: 175/99 151/74 149/84 146/72  Pulse: 109 85 90 74  Temp:   98.1 F (36.7 C)   TempSrc:   Oral   Resp:   18   Height:      Weight:      SpO2:   93%     Intake/Output Summary (Last 24 hours) at 08/21/14 1053 Last data filed at 08/21/14 0900  Gross per 24 hour  Intake    240 ml  Output    800 ml  Net   -560 ml    Exam:  General: No acute respiratory distress Lungs: Clear to auscultation bilaterally without wheezes or crackles Cardiovascular: Regular rate and rhythm without murmur gallop or rub normal S1 and S2 Abdomen: Nontender, nondistended, soft, bowel sounds positive, no rebound, no ascites, no appreciable mass Extremities: No significant cyanosis, clubbing, or edema bilateral lower extremities      Data Reviewed: Basic Metabolic Panel:  Recent Labs Lab 08/20/14 1715 08/21/14 0238  NA 141 139  K 4.6 3.8  CL 106 105  CO2 27 27  GLUCOSE 101* 100*  BUN 17 12  CREATININE 1.09* 1.07*  CALCIUM 9.2 8.5*     Liver Function Tests: No results for input(s): AST, ALT, ALKPHOS, BILITOT, PROT, ALBUMIN in the last 168 hours. No results for input(s): LIPASE, AMYLASE in the last 168 hours. No results for input(s): AMMONIA in the last 168 hours. Lab Results  Component Value Date  INR 2.01* 07/06/2010   INR 1.30 07/05/2010   INR 1.12 07/04/2010     CBC:  Recent Labs Lab 08/20/14 1715 08/21/14 0238  WBC 7.3 8.1  NEUTROABS 3.4  --   HGB 13.5 12.3  HCT 41.7 38.0  MCV 90.5 90.0  PLT 166 161     Cardiac Enzymes:  Recent Labs Lab 08/20/14 1715  TROPONINI <0.03   BNP (last 3 results) No results for input(s): BNP in the last 8760 hours.  ProBNP (last 3 results) No results for input(s): PROBNP in the last  8760 hours.     CBG: No results for input(s): GLUCAP in the last 168 hours.    No results found for this or any previous visit (from the past 240 hour(s)).     Studies: Dg Ribs Unilateral W/chest Right  08/20/2014   CLINICAL DATA:  Right axillary rib pain since yesterday. No known injury.  EXAM: RIGHT RIBS AND CHEST - 3+ VIEW  COMPARISON:  07/22/2013  FINDINGS: Airspace opacity noted in the right upper lobe concerning for pneumonia. No effusion. Left lung is clear. Heart is normal size. No visible rib fracture or pneumothorax.  IMPRESSION: Focal airspace opacity in the right upper lobe concerning for pneumonia. Followup PA and lateral chest X-ray is recommended in 3-4 weeks following trial of antibiotic therapy to ensure resolution and exclude underlying malignancy.   Electronically Signed   By: Rolm Baptise M.D.   On: 08/20/2014 18:08   Ct Angio Chest Pe W/cm &/or Wo Cm  08/20/2014   CLINICAL DATA:  Patient with upper back pain between the shoulder blades. Elevated D-dimer. Recent chest radiograph demonstrated focal right lung opacity.  EXAM: CT ANGIOGRAPHY CHEST WITH CONTRAST  TECHNIQUE: Multidetector CT imaging of the chest was performed using the standard protocol during bolus administration of intravenous contrast. Multiplanar CT image reconstructions and MIPs were obtained to evaluate the vascular anatomy.  CONTRAST:  133mL OMNIPAQUE IOHEXOL 350 MG/ML SOLN  COMPARISON:  Chest radiograph 08/20/2014  FINDINGS: Mediastinum/Nodes: Visualized thyroid is unremarkable. No enlarged axillary, mediastinal or hilar lymphadenopathy. Normal heart size. No pericardial effusion. Limited due to motion artifact however the ascending aorta and main pulmonary artery are normal in size.  Within the inter lobar pulmonary artery (image 134; series 6) there is a bandlike filling defect. Additionally at the bifurcation of the left upper and left lower lobe pulmonary arteries (image 110; series 6) there is  peripheral filling defect.  Lungs/Pleura: The central airways are patent. Subpleural ground-glass opacities particularly within the bilateral lower lobes. 3 mm right upper lobe subpleural nodular opacity (image 26; series 7). Additionally within the right upper lobe there is a large 4 cm area of consolidative opacity. 5 mm subpleural nodule within the right middle lobe (image 50; series 7). Multiple additional subpleural nodules are demonstrated particularly within the right middle and lower lobes. There is a 4 mm right middle lobe nodule (image 49; series 7) and a 5 mm subpleural left lower lobe nodule (image 175; series 6). Subpleural consolidation within the right lower lobe. Trace bilateral pleural effusions.  Upper abdomen: Thickening of the bilateral adrenal glands. Otherwise no acute process identified within the visualized aspect of the upper abdomen.  Musculoskeletal: No aggressive or acute appearing osseous lesions.  Review of the MIP images confirms the above findings.  IMPRESSION: 1. Bandlike filling defect within the interlobar pulmonary artery and additional peripheral filling defect within the left pulmonary artery at the bifurcation of the left upper and left lower  lobes. Given the appearance on current imaging, findings are favored to represent sequelae of chronic pulmonary embolus. 2. Large area of consolidation within the right upper lobe which may be secondary to pneumonia, potentially atypical in etiology. Recommend short-term followup CT in 3- 4 weeks to assess for interval change/resolution. 3. Multiple bilateral predominantly subpleural nodules measuring up to 5 mm as described above. Recommend attention on above recommended followup CT. Critical Value/emergent results were called by telephone at the time of interpretation on 08/20/2014 at 6:29 pm to Dr. Ezequiel Essex , who verbally acknowledged these results.   Electronically Signed   By: Lovey Newcomer M.D.   On: 08/20/2014 18:33      No  results found for: HGBA1C Lab Results  Component Value Date   LDLCALC 96 01/14/2014   CREATININE 1.07* 08/21/2014       Scheduled Meds: . atorvastatin  10 mg Oral Daily  . busPIRone  10 mg Oral TID  . divalproex  500 mg Oral Daily  . losartan  50 mg Oral Daily   And  . hydrochlorothiazide  12.5 mg Oral Daily  . levofloxacin (LEVAQUIN) IV  750 mg Intravenous Q24H  . pantoprazole  40 mg Oral Daily  . propranolol ER  120 mg Oral Daily  . sodium chloride  3 mL Intravenous Q12H  . sodium chloride  3 mL Intravenous Q12H   Continuous Infusions: . heparin 1,100 Units/hr (08/21/14 0348)    Principal Problem:   Pulmonary embolism Active Problems:   Graves' disease   Dyslipidemia   HTN (hypertension)   Seizure   CAP (community acquired pneumonia)   Benign essential tremor   Intrinsic asthma    Time spent: 45 minutes   [place] [place] [facility] Pager [phone]. If 7PM-7AM, please contact night-coverage at [email or web address], password [facility] 08/21/2014, 10:53 AM  LOS: 1 day

## 2014-08-22 ENCOUNTER — Telehealth (HOSPITAL_COMMUNITY): Payer: Self-pay | Admitting: Pharmacist

## 2014-08-22 LAB — COMPREHENSIVE METABOLIC PANEL
ALBUMIN: 2.5 g/dL — AB (ref 3.5–5.0)
ALK PHOS: 46 U/L (ref 38–126)
ALT: 11 U/L — ABNORMAL LOW (ref 14–54)
ANION GAP: 7 (ref 5–15)
AST: 13 U/L — ABNORMAL LOW (ref 15–41)
BILIRUBIN TOTAL: 0.4 mg/dL (ref 0.3–1.2)
BUN: 15 mg/dL (ref 6–20)
CO2: 27 mmol/L (ref 22–32)
CREATININE: 1.33 mg/dL — AB (ref 0.44–1.00)
Calcium: 8.7 mg/dL — ABNORMAL LOW (ref 8.9–10.3)
Chloride: 101 mmol/L (ref 101–111)
GFR calc Af Amer: 45 mL/min — ABNORMAL LOW (ref >60)
GFR calc non Af Amer: 39 mL/min — ABNORMAL LOW (ref >60)
Glucose, Bld: 93 mg/dL (ref 65–99)
POTASSIUM: 3.9 mmol/L (ref 3.5–5.1)
Sodium: 135 mmol/L (ref 135–145)
Total Protein: 6.3 g/dL — ABNORMAL LOW (ref 6.5–8.1)

## 2014-08-22 LAB — CBC
HCT: 37.4 % (ref 36.0–46.0)
Hemoglobin: 12.1 g/dL (ref 12.0–15.0)
MCH: 29.2 pg (ref 26.0–34.0)
MCHC: 32.4 g/dL (ref 30.0–36.0)
MCV: 90.1 fL (ref 78.0–100.0)
PLATELETS: 163 10*3/uL (ref 150–400)
RBC: 4.15 MIL/uL (ref 3.87–5.11)
RDW: 14.7 % (ref 11.5–15.5)
WBC: 8.9 10*3/uL (ref 4.0–10.5)

## 2014-08-22 MED ORDER — FUROSEMIDE 20 MG PO TABS: 20.0000 mg | ORAL_TABLET | Freq: Every day | ORAL | Status: DC

## 2014-08-22 MED ORDER — APIXABAN 5 MG PO TABS
5.0000 mg | ORAL_TABLET | Freq: Two times a day (BID) | ORAL | Status: DC
Start: 2014-08-29 — End: 2014-08-27

## 2014-08-22 MED ORDER — LEVOFLOXACIN 750 MG PO TABS: 750.0000 mg | ORAL_TABLET | Freq: Every day | ORAL | Status: DC

## 2014-08-22 MED ORDER — FUROSEMIDE 10 MG/ML IJ SOLN: 40.0000 mg | Freq: Once | INTRAMUSCULAR | Status: DC

## 2014-08-22 MED ORDER — LOSARTAN POTASSIUM 100 MG PO TABS: 100.0000 mg | ORAL_TABLET | Freq: Every day | ORAL | Status: DC

## 2014-08-22 MED ORDER — APIXABAN 5 MG PO TABS: 10.0000 mg | ORAL_TABLET | Freq: Two times a day (BID) | ORAL | Status: DC

## 2014-08-22 NOTE — Discharge Summary (Addendum)
Physician Discharge Summary  [name] MRN: [id number] DOB/AGE: [birth year] 74 y.o.  PCP: Louanne Grant, MD   Admit date: 08/20/2014 Discharge date: 08/22/2014  Discharge Diagnoses:   Principal Problem:   Pulmonary embolism Active Problems:   Graves' disease   Dyslipidemia   HTN (hypertension)   Seizure   CAP (community acquired pneumonia)   Benign essential tremor   Intrinsic asthma    Follow-up recommendations Follow-up with PCP in 3-5 days Follow-up CBC, CMP in 3-5 days    Medication List    STOP taking these medications        losartan-hydrochlorothiazide 50-12.5 MG per tablet  Commonly known as:  HYZAAR      TAKE these medications        albuterol 108 (90 BASE) MCG/ACT inhaler  Commonly known as:  PROAIR HFA  Inhale 1-2 puffs into the lungs every 6 (six) hours as needed for wheezing or shortness of breath.     apixaban 5 MG Tabs tablet  Commonly known as:  ELIQUIS  Take 2 tablets (10 mg total) by mouth 2 (two) times daily.     apixaban 5 MG Tabs tablet  Commonly known as:  ELIQUIS  Take 1 tablet (5 mg total) by mouth 2 (two) times daily.  Start taking on:  08/29/2014     atorvastatin 10 MG tablet  Commonly known as:  LIPITOR  Take 1 tablet (10 mg total) by mouth daily.     busPIRone 10 MG tablet  Commonly known as:  BUSPAR  Take 1 tablet (10 mg total) by mouth 3 (three) times daily.     butalbital-acetaminophen-caffeine 50-325-40 MG per tablet  Commonly known as:  FIORICET, ESGIC  Take 1 tablet by mouth every 6 (six) hours as needed for headache.     CALCIUM 1000 + D PO  Take 1 tablet by mouth 3 (three) times daily.     divalproex 500 MG 24 hr tablet  Commonly known as:  DEPAKOTE ER  1 tab ($Remo'500mg'IIXsU$ ) in am, then 2 tabs (1082md) at bedtime     furosemide 20 MG tablet  Commonly known as:  LASIX  Take 1 tablet (20 mg total) by mouth daily.     levofloxacin 750 MG tablet  Commonly known as:  LEVAQUIN  Take 1 tablet (750 mg total) by  mouth daily.     loratadine 10 MG tablet  Commonly known as:  CLARITIN  Take 1 tablet (10 mg total) by mouth daily as needed for allergies.     losartan 100 MG tablet  Commonly known as:  COZAAR  Take 1 tablet (100 mg total) by mouth daily.     mometasone-formoterol 100-5 MCG/ACT Aero  Commonly known as:  DULERA  Inhale 2 puffs into the lungs 2 (two) times daily.     nortriptyline 25 MG capsule  Commonly known as:  PAMELOR  Take 1 capsule (25 mg total) by mouth at bedtime as needed for sleep.     omeprazole 40 MG capsule  Commonly known as:  PRILOSEC  Take 1 capsule (40 mg total) by mouth 2 (two) times daily.     oxyCODONE 5 MG immediate release tablet  Commonly known as:  Oxy IR/ROXICODONE  Take 5 mg by mouth every 6 (six) hours as needed. For pain     propranolol ER 120 MG 24 hr capsule  Commonly known as:  INDERAL LA  Take 1 capsule (120 mg total) by mouth daily.     valACYclovir 1000 MG  tablet  Commonly known as:  VALTREX  Take 1,000 mg by mouth daily as needed (for outbreaks).     zolpidem 5 MG tablet  Commonly known as:  AMBIEN  Take 1 tablet (5 mg total) by mouth at bedtime as needed for sleep.         Discharge Condition: Stable    Disposition: 03-Skilled Nursing Facility   Consults:      Significant Diagnostic Studies:  Dg Ribs Unilateral W/chest Right  08/20/2014   CLINICAL DATA:  Right axillary rib pain since yesterday. No known injury.  EXAM: RIGHT RIBS AND CHEST - 3+ VIEW  COMPARISON:  07/22/2013  FINDINGS: Airspace opacity noted in the right upper lobe concerning for pneumonia. No effusion. Left lung is clear. Heart is normal size. No visible rib fracture or pneumothorax.  IMPRESSION: Focal airspace opacity in the right upper lobe concerning for pneumonia. Followup PA and lateral chest X-ray is recommended in 3-4 weeks following trial of antibiotic therapy to ensure resolution and exclude underlying malignancy.   Electronically Signed   By: Rolm Baptise M.D.   On: 08/20/2014 18:08   Ct Angio Chest Pe W/cm &/or Wo Cm  08/20/2014   CLINICAL DATA:  Patient with upper back pain between the shoulder blades. Elevated D-dimer. Recent chest radiograph demonstrated focal right lung opacity.  EXAM: CT ANGIOGRAPHY CHEST WITH CONTRAST  TECHNIQUE: Multidetector CT imaging of the chest was performed using the standard protocol during bolus administration of intravenous contrast. Multiplanar CT image reconstructions and MIPs were obtained to evaluate the vascular anatomy.  CONTRAST:  183mL OMNIPAQUE IOHEXOL 350 MG/ML SOLN  COMPARISON:  Chest radiograph 08/20/2014  FINDINGS: Mediastinum/Nodes: Visualized thyroid is unremarkable. No enlarged axillary, mediastinal or hilar lymphadenopathy. Normal heart size. No pericardial effusion. Limited due to motion artifact however the ascending aorta and main pulmonary artery are normal in size.  Within the inter lobar pulmonary artery (image 134; series 6) there is a bandlike filling defect. Additionally at the bifurcation of the left upper and left lower lobe pulmonary arteries (image 110; series 6) there is peripheral filling defect.  Lungs/Pleura: The central airways are patent. Subpleural ground-glass opacities particularly within the bilateral lower lobes. 3 mm right upper lobe subpleural nodular opacity (image 26; series 7). Additionally within the right upper lobe there is a large 4 cm area of consolidative opacity. 5 mm subpleural nodule within the right middle lobe (image 50; series 7). Multiple additional subpleural nodules are demonstrated particularly within the right middle and lower lobes. There is a 4 mm right middle lobe nodule (image 49; series 7) and a 5 mm subpleural left lower lobe nodule (image 175; series 6). Subpleural consolidation within the right lower lobe. Trace bilateral pleural effusions.  Upper abdomen: Thickening of the bilateral adrenal glands. Otherwise no acute process identified within the  visualized aspect of the upper abdomen.  Musculoskeletal: No aggressive or acute appearing osseous lesions.  Review of the MIP images confirms the above findings.  IMPRESSION: 1. Bandlike filling defect within the interlobar pulmonary artery and additional peripheral filling defect within the left pulmonary artery at the bifurcation of the left upper and left lower lobes. Given the appearance on current imaging, findings are favored to represent sequelae of chronic pulmonary embolus. 2. Large area of consolidation within the right upper lobe which may be secondary to pneumonia, potentially atypical in etiology. Recommend short-term followup CT in 3- 4 weeks to assess for interval change/resolution. 3. Multiple bilateral predominantly subpleural nodules measuring up  to 5 mm as described above. Recommend attention on above recommended followup CT. Critical Value/emergent results were called by telephone at the time of interpretation on 08/20/2014 at 6:29 pm to Dr. Glynn Octave , who verbally acknowledged these results.   Electronically Signed   By: Annia Belt M.D.   On: 08/20/2014 18:33    2-D echo     Filed Weights   08/20/14 1629 08/20/14 2048  Weight: 82.555 kg (182 lb) 84.3 kg (185 lb 13.6 oz)     Microbiology: No results found for this or any previous visit (from the past 240 hour(s)).     Blood Culture    Component Value Date/Time   SDES URINE, CLEAN CATCH 06/05/2014 2341   SPECREQUEST NONE 06/05/2014 2341   CULT NO GROWTH Performed at [facility]  06/05/2014 2341   REPTSTATUS 06/07/2014 FINAL 06/05/2014 2341      Labs: Results for orders placed or performed during the hospital encounter of 08/20/14 (from the past 48 hour(s))  Urinalysis, Routine w reflex microscopic     Status: Abnormal   Collection Time: 08/20/14  5:03 PM  Result Value Ref Range   Color, Urine YELLOW YELLOW   APPearance CLEAR CLEAR   Specific Gravity, Urine 1.021 1.005 - 1.030   pH 6.0 5.0 -  8.0   Glucose, UA NEGATIVE NEGATIVE mg/dL   Hgb urine dipstick NEGATIVE NEGATIVE   Bilirubin Urine NEGATIVE NEGATIVE   Ketones, ur 15 (A) NEGATIVE mg/dL   Protein, ur NEGATIVE NEGATIVE mg/dL   Urobilinogen, UA 1.0 0.0 - 1.0 mg/dL   Nitrite NEGATIVE NEGATIVE   Leukocytes, UA NEGATIVE NEGATIVE    Comment: MICROSCOPIC NOT DONE ON URINES WITH NEGATIVE PROTEIN, BLOOD, LEUKOCYTES, NITRITE, OR GLUCOSE <1000 mg/dL.  CBC with Differential/Platelet     Status: Abnormal   Collection Time: 08/20/14  5:15 PM  Result Value Ref Range   WBC 7.3 4.0 - 10.5 K/uL   RBC 4.61 3.87 - 5.11 MIL/uL   Hemoglobin 13.5 12.0 - 15.0 g/dL   HCT 02.3 41.0 - 71.4 %   MCV 90.5 78.0 - 100.0 fL   MCH 29.3 26.0 - 34.0 pg   MCHC 32.4 30.0 - 36.0 g/dL   RDW 88.9 48.7 - 62.2 %   Platelets 166 150 - 400 K/uL   Neutrophils Relative % 47 43 - 77 %   Neutro Abs 3.4 1.7 - 7.7 K/uL   Lymphocytes Relative 39 12 - 46 %   Lymphs Abs 2.8 0.7 - 4.0 K/uL   Monocytes Relative 13 (H) 3 - 12 %   Monocytes Absolute 1.0 0.1 - 1.0 K/uL   Eosinophils Relative 1 0 - 5 %   Eosinophils Absolute 0.1 0.0 - 0.7 K/uL   Basophils Relative 0 0 - 1 %   Basophils Absolute 0.0 0.0 - 0.1 K/uL  Basic metabolic panel     Status: Abnormal   Collection Time: 08/20/14  5:15 PM  Result Value Ref Range   Sodium 141 135 - 145 mmol/L   Potassium 4.6 3.5 - 5.1 mmol/L   Chloride 106 101 - 111 mmol/L   CO2 27 22 - 32 mmol/L   Glucose, Bld 101 (H) 65 - 99 mg/dL   BUN 17 6 - 20 mg/dL   Creatinine, Ser 9.14 (H) 0.44 - 1.00 mg/dL   Calcium 9.2 8.9 - 61.2 mg/dL   GFR calc non Af Amer 49 (L) >60 mL/min   GFR calc Af Amer 57 (L) >60 mL/min  Comment: (NOTE) The eGFR has been calculated using the CKD EPI equation. This calculation has not been validated in all clinical situations. eGFR's persistently <60 mL/min signify possible Chronic Kidney Disease.    Anion gap 8 5 - 15  Troponin I     Status: None   Collection Time: 08/20/14  5:15 PM  Result Value  Ref Range   Troponin I <0.03 <0.031 ng/mL    Comment:        NO INDICATION OF MYOCARDIAL INJURY.   D-dimer, quantitative     Status: Abnormal   Collection Time: 08/20/14  5:15 PM  Result Value Ref Range   D-Dimer, Quant 1.14 (H) 0.00 - 0.48 ug/mL-FEU    Comment:        AT THE INHOUSE ESTABLISHED CUTOFF VALUE OF 0.48 ug/mL FEU, THIS ASSAY HAS BEEN DOCUMENTED IN THE LITERATURE TO HAVE A SENSITIVITY AND NEGATIVE PREDICTIVE VALUE OF AT LEAST 98 TO 99%.  THE TEST RESULT SHOULD BE CORRELATED WITH AN ASSESSMENT OF THE CLINICAL PROBABILITY OF DVT / VTE.   I-Stat CG4 Lactic Acid, ED     Status: None   Collection Time: 08/20/14  7:30 PM  Result Value Ref Range   Lactic Acid, Venous 1.57 0.5 - 2.0 mmol/L  Heparin level (unfractionated)     Status: Abnormal   Collection Time: 08/21/14  2:38 AM  Result Value Ref Range   Heparin Unfractionated 0.85 (H) 0.30 - 0.70 IU/mL    Comment:        IF HEPARIN RESULTS ARE BELOW EXPECTED VALUES, AND PATIENT DOSAGE HAS BEEN CONFIRMED, SUGGEST FOLLOW UP TESTING OF ANTITHROMBIN III LEVELS.   Basic metabolic panel     Status: Abnormal   Collection Time: 08/21/14  2:38 AM  Result Value Ref Range   Sodium 139 135 - 145 mmol/L   Potassium 3.8 3.5 - 5.1 mmol/L   Chloride 105 101 - 111 mmol/L   CO2 27 22 - 32 mmol/L   Glucose, Bld 100 (H) 65 - 99 mg/dL   BUN 12 6 - 20 mg/dL   Creatinine, Ser 1.07 (H) 0.44 - 1.00 mg/dL   Calcium 8.5 (L) 8.9 - 10.3 mg/dL   GFR calc non Af Amer 51 (L) >60 mL/min   GFR calc Af Amer 59 (L) >60 mL/min    Comment: (NOTE) The eGFR has been calculated using the CKD EPI equation. This calculation has not been validated in all clinical situations. eGFR's persistently <60 mL/min signify possible Chronic Kidney Disease.    Anion gap 7 5 - 15  CBC     Status: None   Collection Time: 08/21/14  2:38 AM  Result Value Ref Range   WBC 8.1 4.0 - 10.5 K/uL   RBC 4.22 3.87 - 5.11 MIL/uL   Hemoglobin 12.3 12.0 - 15.0 g/dL   HCT  38.0 36.0 - 46.0 %   MCV 90.0 78.0 - 100.0 fL   MCH 29.1 26.0 - 34.0 pg   MCHC 32.4 30.0 - 36.0 g/dL   RDW 14.6 11.5 - 15.5 %   Platelets 161 150 - 400 K/uL  TSH     Status: Abnormal   Collection Time: 08/21/14  2:38 AM  Result Value Ref Range   TSH 4.536 (H) 0.350 - 4.500 uIU/mL  Heparin level (unfractionated)     Status: None   Collection Time: 08/21/14 11:48 AM  Result Value Ref Range   Heparin Unfractionated 0.59 0.30 - 0.70 IU/mL    Comment:  IF HEPARIN RESULTS ARE BELOW EXPECTED VALUES, AND PATIENT DOSAGE HAS BEEN CONFIRMED, SUGGEST FOLLOW UP TESTING OF ANTITHROMBIN III LEVELS.   Valproic acid level     Status: None   Collection Time: 08/21/14 11:48 AM  Result Value Ref Range   Valproic Acid Lvl 54 50.0 - 100.0 ug/mL  T4, free     Status: None   Collection Time: 08/21/14 11:48 AM  Result Value Ref Range   Free T4 0.99 0.61 - 1.12 ng/dL  Heparin level (unfractionated)     Status: Abnormal   Collection Time: 08/21/14  7:07 PM  Result Value Ref Range   Heparin Unfractionated 0.28 (L) 0.30 - 0.70 IU/mL    Comment:        IF HEPARIN RESULTS ARE BELOW EXPECTED VALUES, AND PATIENT DOSAGE HAS BEEN CONFIRMED, SUGGEST FOLLOW UP TESTING OF ANTITHROMBIN III LEVELS.   Comprehensive metabolic panel     Status: Abnormal   Collection Time: 08/22/14  5:45 AM  Result Value Ref Range   Sodium 135 135 - 145 mmol/L   Potassium 3.9 3.5 - 5.1 mmol/L   Chloride 101 101 - 111 mmol/L   CO2 27 22 - 32 mmol/L   Glucose, Bld 93 65 - 99 mg/dL   BUN 15 6 - 20 mg/dL   Creatinine, Ser 1.33 (H) 0.44 - 1.00 mg/dL   Calcium 8.7 (L) 8.9 - 10.3 mg/dL   Total Protein 6.3 (L) 6.5 - 8.1 g/dL   Albumin 2.5 (L) 3.5 - 5.0 g/dL   AST 13 (L) 15 - 41 U/L   ALT 11 (L) 14 - 54 U/L   Alkaline Phosphatase 46 38 - 126 U/L   Total Bilirubin 0.4 0.3 - 1.2 mg/dL   GFR calc non Af Amer 39 (L) >60 mL/min   GFR calc Af Amer 45 (L) >60 mL/min    Comment: (NOTE) The eGFR has been calculated using the CKD  EPI equation. This calculation has not been validated in all clinical situations. eGFR's persistently <60 mL/min signify possible Chronic Kidney Disease.    Anion gap 7 5 - 15  CBC     Status: None   Collection Time: 08/22/14  5:45 AM  Result Value Ref Range   WBC 8.9 4.0 - 10.5 K/uL   RBC 4.15 3.87 - 5.11 MIL/uL   Hemoglobin 12.1 12.0 - 15.0 g/dL   HCT 37.4 36.0 - 46.0 %   MCV 90.1 78.0 - 100.0 fL   MCH 29.2 26.0 - 34.0 pg   MCHC 32.4 30.0 - 36.0 g/dL   RDW 14.7 11.5 - 15.5 %   Platelets 163 150 - 400 K/uL     Lipid Panel     Component Value Date/Time   CHOL 171 01/14/2014 1019   TRIG 176* 01/14/2014 1019   HDL 40 01/14/2014 1019   CHOLHDL 4.3 01/14/2014 1019   VLDL 35 01/14/2014 1019   LDLCALC 96 01/14/2014 1019   LDLDIRECT 172.2 08/07/2010 1125     No results found for: HGBA1C   Lab Results  Component Value Date   LDLCALC 96 01/14/2014   CREATININE 1.33* 08/22/2014     HPI [name] is a 74 y.o. female with a history of HTN, Graves Disease, Hyperlipidemia who resents to the ED with complaints of SOB and DOE and Pleurtic Chest pain x 2-3 days.Patient had Bilateral L4-L5 laminectomy, decompression of the thecal sac, foraminotomy to decompress the L4, L5, and S1 nerve rootin March 2016. She denies having fevers or  chills. She also reports that her cough is nonproductive and her chest hurts when she coughs or takes a deep breath. She denies having hemoptysis. She was evaluated at the [facility] ED and was found to have a Pulmonary Embolism and RUL pneumonia on CTA of the Chest and Chest X-ray. She was started on an IV Heparin drip and administered IV Levaquin and transferred for admission to [place]:  Subacute pulmonary embolism Initially started on a heparin drip, likely developed in the setting of her recent back surgery, confirmed by CT scan as above Patient has been transitioned from heparin to Eliquis, discussed  several different options for long-term anticoagulation, patient is agreeable to Eliquis No history of GI bleeding   Community-acquired pneumonia Treated with Levaquin, patient to continue with Levaquin for another 5 days   History of Graves' disease, TSH high normal, TSH 4.5, free T4 0.99  Dyslipidemia continue atorvastatin  Hypertension increased Cozaar and discontinued hydrochlorothiazide, hydrochlorothiazide switched to low-dose Lasix. Patient is also taking propranolol  History of asthma. The patient on when necessary Xopenex  Seizure disorder continue Depakote , Depakote level 54   Discharge Exam: Blood pressure 131/67, pulse 78, temperature 98.8 F (37.1 C), temperature source Oral, resp. rate 18, height $RemoveBe'5\' 3"'eIKNTLgtj$  (1.6 m), weight 84.3 kg (185 lb 13.6 oz), SpO2 92 %.  General: No acute respiratory distress Lungs: Clear to auscultation bilaterally without wheezes or crackles Cardiovascular: Regular rate and rhythm without murmur gallop or rub normal S1 and S2 Abdomen: Nontender, nondistended, soft, bowel sounds positive, no rebound, no ascites, no appreciable mass Extremities: No significant cyanosis, clubbing, or edema bilateral lower extremities       Discharge Instructions    Diet - low sodium heart healthy    Complete by:  As directed      Diet - low sodium heart healthy    Complete by:  As directed      Increase activity slowly    Complete by:  As directed      Increase activity slowly    Complete by:  As directed              Signed: Tiquan Bouch 08/22/2014, 10:13 AM        Time spent >45 mins

## 2014-08-22 NOTE — Evaluation (Signed)
Physical Therapy Evaluation Patient Details Name: [name] MRN: [id number] DOB: January 16, 1941 Today's Date: 08/22/2014   History of Present Illness  74 yo female with onset PE and prior back surgery was admitted with chest pain and SOB and also found PNA.  Therapeutic on meds for PT  Clinical Impression  Pt was seen for brief initial evaluation with findings of dizziness and unsteadiness with gait.  Her plan with PT would to be go to SNF for short stay to increase endurance and make her safer and a lower fall risk at home.  Pt is there without assistance more than brief family visits, and would benefit from increased endurance and better balance.    Follow Up Recommendations SNF    Equipment Recommendations  None recommended by PT    Recommendations for Other Services       Precautions / Restrictions Precautions Precautions: None Restrictions Weight Bearing Restrictions: No      Mobility  Bed Mobility Overal bed mobility: Needs Assistance Bed Mobility: Supine to Sit;Sit to Supine     Supine to sit: Min assist Sit to supine: Min assist   General bed mobility comments: assistance to maneuver covering and to help with getting clothing on for walk  Transfers Overall transfer level: Needs assistance Equipment used: Rolling walker (2 wheeled);1 person hand held assist Transfers: Sit to/from [facility] Sit to Stand: Min guard;Min assist Stand pivot transfers: Min guard;Min assist       General transfer comment: assistance due to dizziness with effort  Ambulation/Gait Ambulation/Gait assistance: Min guard;Min assist Ambulation Distance (Feet): 100 Feet Assistive device: Rolling walker (2 wheeled) Gait Pattern/deviations: Step-through pattern;Decreased stride length;Wide base of support;Trunk flexed;Drifts right/left Gait velocity: reduced Gait velocity interpretation: Below normal speed for age/gender General Gait Details: asssited for steadying  with turns and to manage her IV poke  Stairs            Wheelchair Mobility    Modified Rankin (Stroke Patients Only)       Balance Overall balance assessment: Needs assistance Sitting-balance support: Feet supported Sitting balance-Leahy Scale: Fair   Postural control: Posterior lean Standing balance support: Bilateral upper extremity supported Standing balance-Leahy Scale: Poor Standing balance comment: fair with walker                             Pertinent Vitals/Pain Pain Assessment: No/denies pain    Home Living Family/patient expects to be discharged to:: Private residence Living Arrangements: Alone Available Help at Discharge: Family Type of Home: Apartment Home Access: Level entry     Home Layout: One level Home Equipment: Bedside commode;Walker - 2 wheels;Shower seat      Prior Function Level of Independence: Independent with assistive device(s)         Comments: using a SPC     Hand Dominance   Dominant Hand: Right    Extremity/Trunk Assessment   Upper Extremity Assessment: Overall WFL for tasks assessed           Lower Extremity Assessment: Overall WFL for tasks assessed      Cervical / Trunk Assessment: Normal  Communication   Communication: No difficulties  Cognition Arousal/Alertness: Awake/alert Behavior During Therapy: WFL for tasks assessed/performed Overall Cognitive Status: Within Functional Limits for tasks assessed                      General Comments General comments (skin integrity, edema, etc.): Pt is having  some feeliing s of dizziness with gait and sats pre gait were 93% pulse 73, post gait was 94% and pulse 83.      Exercises        Assessment/Plan    PT Assessment Patient needs continued PT services  PT Diagnosis Difficulty walking   PT Problem List Decreased range of motion;Decreased activity tolerance;Decreased balance;Decreased mobility;Cardiopulmonary status limiting  activity;Obesity  PT Treatment Interventions Gait training;Functional mobility training;Therapeutic activities;Therapeutic exercise;Balance training;Neuromuscular re-education;Patient/family education   PT Goals (Current goals can be found in the Care Plan section) Acute Rehab PT Goals Patient Stated Goal: to be safe at home PT Goal Formulation: With patient Time For Goal Achievement: 09/05/14 Potential to Achieve Goals: Good    Frequency Min 2X/week   Barriers to discharge Decreased caregiver support no assistance at home and cannot tolerate enough activity to succeed    Co-evaluation               End of Session   Activity Tolerance: Patient tolerated treatment well;Patient limited by fatigue Patient left: in bed;with call bell/phone within reach Nurse Communication: Mobility status;Other (comment) (declined to sit up in chair)         Time: 0938-1829 PT Time Calculation (min) (ACUTE ONLY): 26 min   Charges:   PT Evaluation $Initial PT Evaluation Tier I: 1 Procedure PT Treatments $Gait Training: 8-22 mins   PT G Codes:        Ramond Dial Aug 28, 2014, 10:51 AM   Mee Hives, PT MS Acute Rehab Dept. Number: [phone] [phone] and [place] [phone]

## 2014-08-22 NOTE — Progress Notes (Signed)
Utilization review completed.  

## 2014-08-24 ENCOUNTER — Telehealth: Payer: Self-pay | Admitting: *Deleted

## 2014-08-24 NOTE — Telephone Encounter (Signed)
Called pt concerning TCM appt no answer LMOM RTC.../lmb 

## 2014-08-25 NOTE — Telephone Encounter (Signed)
Daughter called back completed tcm call below...lmb  Transition Care Management Follow-up Telephone Call D/C 08/22/14  How have you been since you were released from the hospital? Daughter states she is stable   Do you understand why you were in the hospital? YES   Do you understand the discharge instrcutions? YES  Items Reviewed:  Medications reviewed: YES.  Allergies reviewed: YES  Dietary changes reviewed: YES  Referrals reviewed: YES, daughter inquiring about home health services to help mom inform h er once she come to see md he can place referral   Functional Questionnaire:   Activities of Daily Living (ADLs):   She states they are independent in the following: ambulation, bathing and hygiene, feeding, continence, grooming, toileting and dressing States she require assistance with the following: ambulation   Any transportation issues/concerns?: NO   Any patient concerns? NO   Confirmed importance and date/time of follow-up visits scheduled: YES, made appt for 08/27/14 w/Dr. Linna Darner   Confirmed with patient if condition begins to worsen call PCP or go to the ER.

## 2014-08-25 NOTE — Telephone Encounter (Signed)
Called pt no answer LMOM RTC ASAP...[name]

## 2014-08-27 ENCOUNTER — Encounter: Payer: Self-pay | Admitting: Internal Medicine

## 2014-08-27 ENCOUNTER — Ambulatory Visit (INDEPENDENT_AMBULATORY_CARE_PROVIDER_SITE_OTHER): Payer: Medicare HMO | Admitting: Internal Medicine

## 2014-08-27 VITALS — BP 122/78 | HR 83 | Temp 97.6°F | Resp 16 | Wt 182.0 lb

## 2014-08-27 DIAGNOSIS — I2699 Other pulmonary embolism without acute cor pulmonale: Secondary | ICD-10-CM

## 2014-08-27 DIAGNOSIS — I10 Essential (primary) hypertension: Secondary | ICD-10-CM

## 2014-08-27 DIAGNOSIS — J189 Pneumonia, unspecified organism: Secondary | ICD-10-CM | POA: Diagnosis not present

## 2014-08-27 MED ORDER — APIXABAN 5 MG PO TABS: 5.0000 mg | ORAL_TABLET | Freq: Two times a day (BID) | ORAL | Status: DC

## 2014-08-27 NOTE — Progress Notes (Signed)
Pre visit review using our clinic review tool, if applicable. No additional management support is needed unless otherwise documented below in the visit note. 

## 2014-08-27 NOTE — Progress Notes (Signed)
   Subjective:    Patient ID: [name], female    DOB: 1940-09-14, 74 y.o.   MRN: [id number]  HPI She was hospitalized 5/20-5/22/16 for primary thromboemboli and right upper lobe pneumonia. The history states that she had shortness of breath and exertional dyspnea and pleuritic pain for 2-3 days. Actually she been having symptoms for several weeks.  She was hospitalized for lumbar decompression for neurogenic claudication in the context of lumbar stenosis 3/2-06/06/14 by Dr. Joya Salm. She was discharged toa rehabilitation facility where she stayed until the end of March.  In the hospital she was transitioned from heparin to Eliquis. She initially received IV Levaquin for CAP.  She has run out of Eliquis as of yesterday. She denies any bleeding dyscrasias on the Eliquis. She has no associated cardiopulmonary symptoms at this time except for chronic edema.  She has had chronic lipedema/edema since she had right total knee replacement in 2011. The edema has been extensively evaluated by her history.. She is not on amlodipine. She does eat sea salt. She is not checking her blood pressure at home.  One daughter was seen @ a medical center for DVT.No clotting dyscrasia was found. Another daughter apparently died of pulmonary thromboemboli.     Review of Systems Epistaxis, hemoptysis, hematuria, melena, or rectal bleeding denied. No unexplained weight loss, significant dyspepsia,dysphagia, or abdominal pain.  There is no abnormal bruising , bleeding, or difficulty stopping bleeding with injury. Chest pain, palpitations, tachycardia, exertional dyspnea, significant cough, sputum production, wheezing,or  paroxysmal nocturnal dyspnea denied.      Objective:   Physical Exam  Pertinent or positive findings include: She has a resting tremor of her head.  She has profound non pitting lipedema/ edema of the lower extremities.  There is some decrease in the posterior tibial pulses due to the  edema.  General appearance :adequately nourished; in no distress. Eyes: No conjunctival inflammation or scleral icterus is present. Oral exam:  Lips and gums are healthy appearing.There is no oropharyngeal erythema or exudate noted. Dental hygiene is good. Heart:  Normal rate and regular rhythm. S1 and S2 normal without gallop, murmur, click, rub or other extra sounds   Lungs:Chest clear to auscultation; no wheezes, rhonchi,rales ,or rubs present.No increased work of breathing.  Abdomen: bowel sounds normal, soft and non-tender without masses, organomegaly or hernias noted.  No guarding or rebound.  Vascular : all pulses equal ; no bruits present. Skin:Warm & dry.  Intact without suspicious lesions or rashes ; no tenting  Lymphatic: No lymphadenopathy is noted about the head, neck, axilla  Neuro: Strength, tone normal.        Assessment & Plan:  See Current Assessment & Plan in Problem List under specific Diagnosis

## 2014-08-27 NOTE — Assessment & Plan Note (Signed)
-   Continue Eliquis 

## 2014-08-27 NOTE — Patient Instructions (Signed)
Your ideal BP goal = AVERAGE < 135/85.Minimal goal is average < 140/90. Avoid ingestion of  excess salt/sodium.Cook with pepper & other spices . Use the salt substitute "No Salt" OR the Mrs Deliah Boston products to season food @ the table. Avoid foods which taste salty or "vinegary" as their sodium content will be high.   Report nose bleeds,coughing up blood, black tarry stool or rectal bleeding. Also report any abnormal bruising , bleeding, or difficulty stopping bleeding with injury.  Do not take aspirin, Advil, naproxen or any other anti-inflammatory agents without consulting a doctor while you are on the Eliquis

## 2014-08-27 NOTE — Assessment & Plan Note (Signed)
See AVS

## 2014-08-29 NOTE — Assessment & Plan Note (Signed)
Clinically resolved.  

## 2014-09-29 ENCOUNTER — Encounter: Payer: Self-pay | Admitting: Neurology

## 2014-09-29 ENCOUNTER — Ambulatory Visit (INDEPENDENT_AMBULATORY_CARE_PROVIDER_SITE_OTHER): Payer: Medicare HMO | Admitting: Neurology

## 2014-09-29 ENCOUNTER — Telehealth: Payer: Self-pay

## 2014-09-29 VITALS — BP 157/86 | HR 77 | Ht 63.0 in | Wt 179.0 lb

## 2014-09-29 DIAGNOSIS — I2699 Other pulmonary embolism without acute cor pulmonale: Secondary | ICD-10-CM

## 2014-09-29 DIAGNOSIS — R569 Unspecified convulsions: Secondary | ICD-10-CM | POA: Diagnosis not present

## 2014-09-29 DIAGNOSIS — M5416 Radiculopathy, lumbar region: Secondary | ICD-10-CM | POA: Diagnosis not present

## 2014-09-29 DIAGNOSIS — R269 Unspecified abnormalities of gait and mobility: Secondary | ICD-10-CM

## 2014-09-29 DIAGNOSIS — M4806 Spinal stenosis, lumbar region: Secondary | ICD-10-CM | POA: Diagnosis not present

## 2014-09-29 DIAGNOSIS — M48061 Spinal stenosis, lumbar region without neurogenic claudication: Secondary | ICD-10-CM

## 2014-09-29 MED ORDER — LEVETIRACETAM 500 MG PO TABS: 500.0000 mg | ORAL_TABLET | Freq: Two times a day (BID) | ORAL | Status: DC
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
## 2014-09-29 NOTE — Telephone Encounter (Signed)
[facility] called and wanted to know the status of an updated med list for pt was. I informed that I have not received a request to reconcile med list. Rep is refaxing to side A fax machine.

## 2014-09-29 NOTE — Telephone Encounter (Signed)
I have pended all non C2 rx's. They need to be changed to print to send to new pharmacy.   There were several that were not rxed by PCP:   Fioricet Lasix Keppra Cozaar  Are these okay to fill as well.

## 2014-09-30 MED ORDER — PROPRANOLOL HCL ER 120 MG PO CP24: 120.0000 mg | ORAL_CAPSULE | Freq: Every day | ORAL | Status: DC

## 2014-09-30 MED ORDER — LOSARTAN POTASSIUM 100 MG PO TABS: 100.0000 mg | ORAL_TABLET | Freq: Every day | ORAL | Status: DC

## 2014-09-30 MED ORDER — BUSPIRONE HCL 10 MG PO TABS: 10.0000 mg | ORAL_TABLET | Freq: Three times a day (TID) | ORAL | Status: DC

## 2014-09-30 MED ORDER — LORATADINE 10 MG PO TABS: 10.0000 mg | ORAL_TABLET | Freq: Every day | ORAL | Status: DC | PRN

## 2014-09-30 MED ORDER — MOMETASONE FURO-FORMOTEROL FUM 100-5 MCG/ACT IN AERO: 2.0000 | INHALATION_SPRAY | Freq: Two times a day (BID) | RESPIRATORY_TRACT | Status: DC

## 2014-09-30 MED ORDER — FUROSEMIDE 20 MG PO TABS: 20.0000 mg | ORAL_TABLET | Freq: Every day | ORAL | Status: DC

## 2014-09-30 MED ORDER — NORTRIPTYLINE HCL 25 MG PO CAPS: 25.0000 mg | ORAL_CAPSULE | Freq: Every evening | ORAL | Status: DC | PRN

## 2014-09-30 MED ORDER — BUTALBITAL-APAP-CAFFEINE 50-325-40 MG PO TABS: 1.0000 | ORAL_TABLET | Freq: Four times a day (QID) | ORAL | Status: DC | PRN

## 2014-09-30 MED ORDER — ZOLPIDEM TARTRATE 5 MG PO TABS: 5.0000 mg | ORAL_TABLET | Freq: Every evening | ORAL | Status: DC | PRN

## 2014-09-30 MED ORDER — ATORVASTATIN CALCIUM 10 MG PO TABS: 10.0000 mg | ORAL_TABLET | Freq: Every day | ORAL | Status: DC

## 2014-09-30 MED ORDER — ALBUTEROL SULFATE HFA 108 (90 BASE) MCG/ACT IN AERS: 1.0000 | INHALATION_SPRAY | Freq: Four times a day (QID) | RESPIRATORY_TRACT | Status: DC | PRN

## 2014-09-30 MED ORDER — VALACYCLOVIR HCL 1 G PO TABS: 1000.0000 mg | ORAL_TABLET | Freq: Every day | ORAL | Status: DC | PRN

## 2014-09-30 MED ORDER — OMEPRAZOLE 40 MG PO CPDR
40.0000 mg | DELAYED_RELEASE_CAPSULE | Freq: Two times a day (BID) | ORAL | Status: DC
Start: 2014-09-30 — End: 2015-06-09

## 2014-09-30 MED ORDER — APIXABAN 5 MG PO TABS: 5.0000 mg | ORAL_TABLET | Freq: Two times a day (BID) | ORAL | Status: DC

## 2014-09-30 MED ORDER — LEVETIRACETAM 500 MG PO TABS: 500.0000 mg | ORAL_TABLET | Freq: Two times a day (BID) | ORAL | Status: DC

## 2014-09-30 NOTE — Telephone Encounter (Signed)
Yes, ok to rx all these listed thanks

## 2014-10-01 ENCOUNTER — Telehealth: Payer: Self-pay | Admitting: Internal Medicine

## 2014-10-01 NOTE — Telephone Encounter (Signed)
Faxed request and rx hard script to [facility].

## 2014-10-01 NOTE — Telephone Encounter (Signed)
[facility] is requesting her ProAir inhaler be changed back to the ventolin. Her insurance will cover the ventolin

## 2014-10-05 MED ORDER — ALBUTEROL SULFATE HFA 108 (90 BASE) MCG/ACT IN AERS: 2.0000 | INHALATION_SPRAY | Freq: Four times a day (QID) | RESPIRATORY_TRACT | Status: DC | PRN

## 2014-10-05 NOTE — Telephone Encounter (Signed)
rx printed for MD to sign and to be faxed to [facility].

## 2014-10-06 ENCOUNTER — Other Ambulatory Visit: Payer: Self-pay | Admitting: Internal Medicine

## 2014-10-08 ENCOUNTER — Telehealth: Payer: Self-pay | Admitting: Internal Medicine

## 2014-10-08 ENCOUNTER — Ambulatory Visit: Payer: Medicare HMO | Admitting: Internal Medicine

## 2014-10-08 NOTE — Telephone Encounter (Signed)
Error

## 2014-10-11 ENCOUNTER — Other Ambulatory Visit (INDEPENDENT_AMBULATORY_CARE_PROVIDER_SITE_OTHER): Payer: Medicare HMO

## 2014-10-11 ENCOUNTER — Ambulatory Visit (INDEPENDENT_AMBULATORY_CARE_PROVIDER_SITE_OTHER): Payer: Medicare HMO | Admitting: Internal Medicine

## 2014-10-11 ENCOUNTER — Telehealth: Payer: Self-pay

## 2014-10-11 ENCOUNTER — Encounter: Payer: Self-pay | Admitting: Internal Medicine

## 2014-10-11 VITALS — BP 138/82 | HR 64 | Temp 97.7°F | Resp 16 | Wt 178.0 lb

## 2014-10-11 DIAGNOSIS — R5382 Chronic fatigue, unspecified: Secondary | ICD-10-CM

## 2014-10-11 DIAGNOSIS — G479 Sleep disorder, unspecified: Secondary | ICD-10-CM

## 2014-10-11 DIAGNOSIS — I2699 Other pulmonary embolism without acute cor pulmonale: Secondary | ICD-10-CM | POA: Diagnosis not present

## 2014-10-11 LAB — CBC WITH DIFFERENTIAL/PLATELET
BASOS PCT: 0.9 % (ref 0.0–3.0)
Basophils Absolute: 0.1 10*3/uL (ref 0.0–0.1)
EOS PCT: 0.8 % (ref 0.0–5.0)
Eosinophils Absolute: 0.1 10*3/uL (ref 0.0–0.7)
HCT: 43.3 % (ref 36.0–46.0)
Hemoglobin: 14.4 g/dL (ref 12.0–15.0)
LYMPHS ABS: 3.2 10*3/uL (ref 0.7–4.0)
LYMPHS PCT: 35.8 % (ref 12.0–46.0)
MCHC: 33.3 g/dL (ref 30.0–36.0)
MCV: 90.5 fl (ref 78.0–100.0)
Monocytes Absolute: 1.1 10*3/uL — ABNORMAL HIGH (ref 0.1–1.0)
Monocytes Relative: 11.8 % (ref 3.0–12.0)
NEUTROS PCT: 50.7 % (ref 43.0–77.0)
Neutro Abs: 4.6 10*3/uL (ref 1.4–7.7)
Platelets: 217 10*3/uL (ref 150.0–400.0)
RBC: 4.78 Mil/uL (ref 3.87–5.11)
RDW: 16 % — AB (ref 11.5–15.5)
WBC: 9 10*3/uL (ref 4.0–10.5)

## 2014-10-11 LAB — VITAMIN B12: VITAMIN B 12: 1084 pg/mL — AB (ref 211–911)

## 2014-10-11 NOTE — Patient Instructions (Addendum)
  Your next office appointment will be determined based upon review of your pending labs  and  xrays  Those written interpretation of the lab results and instructions will be transmitted to you by mail for your records.  Critical results will be called.   Followup as needed for any active or acute issue. Please report any significant change in your symptoms.  To prevent sleep dysfunction follow these instructions for sleep hygiene. Do not read, watch TV, or eat in bed. Do not get into bed until you are ready to turn off the light &  to go to sleep. Do not ingest stimulants ( decongestants, diet pills, nicotine, caffeine) after the evening meal.Do not take daytime naps.Cardiovascular exercise, this can be as simple a program as walking, is recommended 30-45 minutes 3-4 times per week. If you're not exercising you should take 6-8 weeks to build up to this level.

## 2014-10-11 NOTE — Progress Notes (Signed)
Pre visit review using our clinic review tool, if applicable. No additional management support is needed unless otherwise documented below in the visit note. 

## 2014-10-11 NOTE — Progress Notes (Signed)
   Subjective:    Patient ID: [name], female    DOB: July 16, 1940, 74 y.o.   MRN: [id number]  HPI She is here in follow-up of her complex comorbidities. She had lumbar decompression 3/2-06/04/14. She was discharged to a skilled nursing facility 5/20-5/22. She was rehospitalized for pulmonary thromboemboli and pneumonia. She was treated with IV Levaquin and heparin bridged to Eliquis. Since discharge she took Elequis twice a day for a week and then once a day for a week and then discontinued it. She's been back on it at the insistence of her family.  She was seen by neurology 6/29; Mini-Mental Status exam revealed a value of 30 of 30.   She continues to see her cardiologist.She denies any active cardiopulmonary or bleeding dyscrasias on the Eliquis.  Review of Systems  Chest pain, palpitations, tachycardia, exertional dyspnea, paroxysmal nocturnal dyspnea,or claudication denied.Edema is chronic. Daughter states she does not restrict sodium. Epistaxis, hemoptysis, hematuria, melena, or rectal bleeding denied. No unexplained weight loss, significant dyspepsia,dysphagia, or abdominal pain.  There is no abnormal bruising , bleeding, or difficulty stopping bleeding with injury.  She describes irritation in the right ear. She's been using a Bobby pin to clean out the ear.  She has been taking zolpidem occasionally for sleep dysfunction.  She describes fatigue. She was mildly anemic in May. Thyroid function tests were normal. There is no B12 level on record.     Objective:   Physical Exam Pertinent or positive findings include: There is a very subtle head tremor present. She has slight ptosis of the left eye. The right canal is erythematous inferiorly. She has marked lipedema of the extremities without pitting. Pedal pulses are decreased, especially posterior tibial pulses  General appearance :adequately nourished; in no distress.  Eyes: No conjunctival inflammation or scleral icterus  is present.  Oral exam:  Lips and gums are healthy appearing.There is no oropharyngeal erythema or exudate noted. Dental hygiene is good.  Heart:  Normal rate and regular rhythm. S1 and S2 normal without gallop, murmur, click, rub or other extra sounds    Lungs:Chest clear to auscultation; no wheezes, rhonchi,rales ,or rubs present.No increased work of breathing.   Abdomen: bowel sounds normal, soft and non-tender without masses, organomegaly or hernias noted.  No guarding or rebound.  Vascular : all pulses equal ; no bruits present.  Skin:Warm & dry.  Intact without suspicious lesions or rashes ; no tenting   Lymphatic: No lymphadenopathy is noted about the head, neck, axilla   Neuro: Strength, tone & DTRs normal.        Assessment & Plan:  #1 status post pulmonary thromboemboli in the setting of pneumonia. She should continue the Eliquis for at least 3 months and then be reevaluated. She has no evidence of any complications from the Eliquis  #2 sleep dysfunction; see after visit summary. The risk related to using zolpidem were discussed.  #3 fatigue  May need sleep apnea evaluation based on # 2 & #3  See orders

## 2014-10-11 NOTE — Telephone Encounter (Signed)
LVM for pt to call back as soon as possible.   RE: medication refill and question from [facility] [phone])  levetiracetam and divalproex ER. *need to find out which med patient is taking.

## 2014-10-12 ENCOUNTER — Ambulatory Visit: Payer: Medicare HMO | Admitting: Internal Medicine

## 2014-10-19 ENCOUNTER — Other Ambulatory Visit (INDEPENDENT_AMBULATORY_CARE_PROVIDER_SITE_OTHER): Payer: Medicare HMO

## 2014-10-19 ENCOUNTER — Ambulatory Visit (INDEPENDENT_AMBULATORY_CARE_PROVIDER_SITE_OTHER): Payer: Medicare HMO | Admitting: Internal Medicine

## 2014-10-19 ENCOUNTER — Encounter: Payer: Self-pay | Admitting: Internal Medicine

## 2014-10-19 VITALS — BP 132/94 | HR 90 | Temp 97.9°F | Resp 16 | Wt 180.0 lb

## 2014-10-19 DIAGNOSIS — R413 Other amnesia: Secondary | ICD-10-CM | POA: Diagnosis not present

## 2014-10-19 LAB — VITAMIN B12: VITAMIN B 12: 1285 pg/mL — AB (ref 211–911)

## 2014-10-19 NOTE — Progress Notes (Signed)
Pre visit review using our clinic review tool, if applicable. No additional management support is needed unless otherwise documented below in the visit note. 

## 2014-10-19 NOTE — Patient Instructions (Signed)
The CAT scan referral will be scheduled and you'll be notified of the time.Please call the Referral Co-Ordinator @ [phone] if you have not been notified of appointment time within 7-10 days.

## 2014-10-19 NOTE — Progress Notes (Signed)
   Subjective:    Patient ID: [name], female    DOB: 03-17-1941, 74 y.o.   MRN: [id number]  HPI  She came in w/o an appointment stating she needed to be seen. She did not remember having been seen 10/11/14 by me. She also did not remember seeing her Neurologist, Dr. Krista Blue 09/29/14. That follow-up was for seizures and frequent headaches. She had shown up there also without an appointment.  She denies significant headaches or neurologic symptoms at this time.    Review of Systems Fever, chills, sweats, or unexplained weight loss not present. No significant headaches. Mental status change denied.Unaware of memory issues. Blurred vision , diplopia or vision loss absent. Vertigo, near syncope or imbalance denied. There is no numbness, tingling, or weakness in extremities.   No loss of control of bladder or bowels. Radicular type pain absent. No seizure stigmata.    Objective:   Physical Exam  Pertinent or positive findings include: An S4 gallop is noted. She has minimal rales on auscultation the lungs. She has 2+ pedal edema which is nonpitting. Pedal pulses are decreased. Gait is broad & slow. She uses a cane. She is not upset by discussion of her memory deficit. She did identify the President. She gave the date as 10/21/14.  She states she lives alone & has no relatives close by. According to Dr. Rhea Belton record her granddaughter Luetta Nutting has been helping with her medicines. Her daughter was with her 7/11. She lost another daughter apparently in November 2015, possibly from a stroke.No definite cranial nerve deficit documented.   General appearance :adequately nourished; in no distress.  Eyes: No conjunctival inflammation or scleral icterus is present.  Oral exam:  Lips and gums are healthy appearing.There is no oropharyngeal erythema or exudate noted. Dental hygiene is fair.  Heart:   regular rhythm. S1 and S2 normal without  murmur, click,or rub .   Lungs: no wheezes,  rhonchi,or rubs present.No increased work of breathing.   Abdomen: bowel sounds normal, soft and non-tender without masses, organomegaly or hernias noted.  No guarding or rebound.   Vascular : all pulses equal ; no bruits present.  Skin:Warm & dry.  Intact without suspicious lesions or rashes ; no tenting   Lymphatic: No lymphadenopathy is noted about the head, neck, axilla   Neuro: Strength, tone decreased      Assessment & Plan:  #1 profound memory deficits  Plan: See orders and recommendations

## 2014-10-20 LAB — RPR

## 2014-10-26 ENCOUNTER — Other Ambulatory Visit: Payer: Self-pay | Admitting: Internal Medicine

## 2014-10-26 ENCOUNTER — Ambulatory Visit (INDEPENDENT_AMBULATORY_CARE_PROVIDER_SITE_OTHER)
Admission: RE | Admit: 2014-10-26 | Discharge: 2014-10-26 | Disposition: A | Discharge status: Home / Self Care | Payer: Medicare HMO | Source: Ambulatory Visit | Attending: Internal Medicine | Admitting: Internal Medicine

## 2014-10-26 DIAGNOSIS — R413 Other amnesia: Secondary | ICD-10-CM | POA: Diagnosis not present

## 2014-10-27 ENCOUNTER — Other Ambulatory Visit: Payer: Medicare HMO

## 2014-11-04 ENCOUNTER — Telehealth: Payer: Self-pay | Admitting: Internal Medicine

## 2014-11-04 NOTE — Telephone Encounter (Signed)
There is a referral in EPIC.

## 2014-11-04 NOTE — Telephone Encounter (Signed)
Patient is requesting [facility] referral to Dr. Krista Blue on [address] (neuroligist).  Office would not give appointment until they got referral.

## 2014-11-17 ENCOUNTER — Encounter: Payer: Self-pay | Admitting: Neurology

## 2014-11-17 ENCOUNTER — Ambulatory Visit (INDEPENDENT_AMBULATORY_CARE_PROVIDER_SITE_OTHER): Payer: Medicare HMO | Admitting: Neurology

## 2014-11-17 VITALS — BP 112/82 | HR 72 | Ht 63.0 in | Wt 177.0 lb

## 2014-11-17 DIAGNOSIS — R569 Unspecified convulsions: Secondary | ICD-10-CM | POA: Diagnosis not present

## 2014-11-17 DIAGNOSIS — M4806 Spinal stenosis, lumbar region: Secondary | ICD-10-CM

## 2014-11-17 DIAGNOSIS — R413 Other amnesia: Secondary | ICD-10-CM

## 2014-11-17 DIAGNOSIS — M48061 Spinal stenosis, lumbar region without neurogenic claudication: Secondary | ICD-10-CM

## 2014-11-17 MED ORDER — LAMOTRIGINE ER 100 MG PO TB24: 200.0000 mg | ORAL_TABLET | Freq: Every day | ORAL | Status: DC

## 2014-11-17 MED ORDER — LAMOTRIGINE ER 25 & 50 & 100 MG PO KIT: PACK | ORAL | Status: DC

## 2014-11-17 NOTE — Patient Instructions (Signed)
Keppra=levetiracetam 500mg  one tab every morning for 2 weeks, then stop
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
## 2014-11-22 ENCOUNTER — Telehealth: Payer: Self-pay | Admitting: Nurse Practitioner

## 2014-11-22 MED ORDER — LAMOTRIGINE ER 25 MG PO TB24: ORAL_TABLET | ORAL | Status: DC

## 2014-11-22 NOTE — Telephone Encounter (Addendum)
Starter kit is on back order.  They are requesting a new Rx for this med.  Rx entered as tablets with titration instructions.  Forwarded to provider for approval of this change.     Signed above Rx.  [name], M.D. Ph.D.  [facility] [place],  [address] Phone: [phone] Fax:      [phone]

## 2014-11-22 NOTE — Telephone Encounter (Signed)
[facility] states that LamoTRIgine 25 & 50 & 100 MG KIT is on back order. Would like to know if it can be done differently , May call [phone]. May fax  To [phone].

## 2014-12-01 ENCOUNTER — Other Ambulatory Visit: Payer: Self-pay | Admitting: Internal Medicine

## 2014-12-19 ENCOUNTER — Emergency Department (HOSPITAL_COMMUNITY): Payer: Medicare HMO

## 2014-12-19 ENCOUNTER — Emergency Department (HOSPITAL_COMMUNITY)
Admission: EM | Admit: 2014-12-19 | Discharge: 2014-12-19 | Disposition: A | Discharge status: Home / Self Care | Payer: Medicare HMO | Attending: Emergency Medicine | Admitting: Emergency Medicine

## 2014-12-19 ENCOUNTER — Encounter (HOSPITAL_COMMUNITY): Payer: Self-pay

## 2014-12-19 DIAGNOSIS — E785 Hyperlipidemia, unspecified: Secondary | ICD-10-CM | POA: Diagnosis not present

## 2014-12-19 DIAGNOSIS — J45909 Unspecified asthma, uncomplicated: Secondary | ICD-10-CM | POA: Insufficient documentation

## 2014-12-19 DIAGNOSIS — Z8701 Personal history of pneumonia (recurrent): Secondary | ICD-10-CM | POA: Diagnosis not present

## 2014-12-19 DIAGNOSIS — Y9289 Other specified places as the place of occurrence of the external cause: Secondary | ICD-10-CM | POA: Diagnosis not present

## 2014-12-19 DIAGNOSIS — Y998 Other external cause status: Secondary | ICD-10-CM | POA: Diagnosis not present

## 2014-12-19 DIAGNOSIS — M1990 Unspecified osteoarthritis, unspecified site: Secondary | ICD-10-CM | POA: Insufficient documentation

## 2014-12-19 DIAGNOSIS — Z86711 Personal history of pulmonary embolism: Secondary | ICD-10-CM | POA: Insufficient documentation

## 2014-12-19 DIAGNOSIS — S0990XA Unspecified injury of head, initial encounter: Secondary | ICD-10-CM | POA: Insufficient documentation

## 2014-12-19 DIAGNOSIS — G8929 Other chronic pain: Secondary | ICD-10-CM | POA: Diagnosis not present

## 2014-12-19 DIAGNOSIS — W1839XA Other fall on same level, initial encounter: Secondary | ICD-10-CM | POA: Insufficient documentation

## 2014-12-19 DIAGNOSIS — G43909 Migraine, unspecified, not intractable, without status migrainosus: Secondary | ICD-10-CM | POA: Diagnosis not present

## 2014-12-19 DIAGNOSIS — Y9389 Activity, other specified: Secondary | ICD-10-CM | POA: Diagnosis not present

## 2014-12-19 DIAGNOSIS — Z8673 Personal history of transient ischemic attack (TIA), and cerebral infarction without residual deficits: Secondary | ICD-10-CM | POA: Insufficient documentation

## 2014-12-19 DIAGNOSIS — I10 Essential (primary) hypertension: Secondary | ICD-10-CM | POA: Insufficient documentation

## 2014-12-19 DIAGNOSIS — M797 Fibromyalgia: Secondary | ICD-10-CM | POA: Insufficient documentation

## 2014-12-19 DIAGNOSIS — S3992XA Unspecified injury of lower back, initial encounter: Secondary | ICD-10-CM | POA: Insufficient documentation

## 2014-12-19 DIAGNOSIS — Z79899 Other long term (current) drug therapy: Secondary | ICD-10-CM | POA: Insufficient documentation

## 2014-12-19 DIAGNOSIS — W19XXXA Unspecified fall, initial encounter: Secondary | ICD-10-CM

## 2014-12-19 DIAGNOSIS — F329 Major depressive disorder, single episode, unspecified: Secondary | ICD-10-CM | POA: Diagnosis not present

## 2014-12-19 DIAGNOSIS — M545 Low back pain, unspecified: Secondary | ICD-10-CM

## 2014-12-19 LAB — CBC WITH DIFFERENTIAL/PLATELET
BASOS ABS: 0 10*3/uL (ref 0.0–0.1)
Basophils Relative: 0 %
EOS PCT: 1 %
Eosinophils Absolute: 0.1 10*3/uL (ref 0.0–0.7)
HEMATOCRIT: 39.8 % (ref 36.0–46.0)
HEMOGLOBIN: 12.9 g/dL (ref 12.0–15.0)
LYMPHS ABS: 2.8 10*3/uL (ref 0.7–4.0)
LYMPHS PCT: 38 %
MCH: 29.3 pg (ref 26.0–34.0)
MCHC: 32.4 g/dL (ref 30.0–36.0)
MCV: 90.2 fL (ref 78.0–100.0)
Monocytes Absolute: 0.4 10*3/uL (ref 0.1–1.0)
Monocytes Relative: 6 %
NEUTROS ABS: 4.2 10*3/uL (ref 1.7–7.7)
NEUTROS PCT: 55 %
PLATELETS: 214 10*3/uL (ref 150–400)
RBC: 4.41 MIL/uL (ref 3.87–5.11)
RDW: 13.3 % (ref 11.5–15.5)
WBC: 7.5 10*3/uL (ref 4.0–10.5)

## 2014-12-19 LAB — COMPREHENSIVE METABOLIC PANEL
ALK PHOS: 86 U/L (ref 38–126)
ALT: 11 U/L — AB (ref 14–54)
AST: 16 U/L (ref 15–41)
Albumin: 3.1 g/dL — ABNORMAL LOW (ref 3.5–5.0)
Anion gap: 7 (ref 5–15)
BILIRUBIN TOTAL: 0.3 mg/dL (ref 0.3–1.2)
BUN: 15 mg/dL (ref 6–20)
CALCIUM: 9.4 mg/dL (ref 8.9–10.3)
CHLORIDE: 104 mmol/L (ref 101–111)
CO2: 27 mmol/L (ref 22–32)
CREATININE: 1.38 mg/dL — AB (ref 0.44–1.00)
GFR, EST AFRICAN AMERICAN: 43 mL/min — AB (ref >60)
GFR, EST NON AFRICAN AMERICAN: 37 mL/min — AB (ref >60)
Glucose, Bld: 95 mg/dL (ref 65–99)
Potassium: 4.2 mmol/L (ref 3.5–5.1)
Sodium: 138 mmol/L (ref 135–145)
Total Protein: 7.2 g/dL (ref 6.5–8.1)

## 2014-12-19 LAB — PROTIME-INR
INR: 1.23 (ref 0.00–1.49)
PROTHROMBIN TIME: 15.7 s — AB (ref 11.6–15.2)

## 2014-12-19 MED ORDER — OXYCODONE-ACETAMINOPHEN 5-325 MG PO TABS: 1.0000 | ORAL_TABLET | Freq: Four times a day (QID) | ORAL | Status: DC | PRN

## 2014-12-19 MED ORDER — IBUPROFEN 800 MG PO TABS
800.0000 mg | ORAL_TABLET | Freq: Once | ORAL | Status: AC
  Administered 2014-12-19: 800 mg via ORAL
  Filled 2014-12-19: qty 1

## 2014-12-19 MED ORDER — OXYCODONE-ACETAMINOPHEN 5-325 MG PO TABS
1.0000 | ORAL_TABLET | Freq: Once | ORAL | Status: DC
  Filled 2014-12-19: qty 1

## 2014-12-19 MED ORDER — ONDANSETRON 4 MG PO TBDP
4.0000 mg | ORAL_TABLET | Freq: Once | ORAL | Status: DC
  Filled 2014-12-19: qty 1

## 2014-12-19 NOTE — Discharge Instructions (Signed)
Back Pain, Adult °Low back pain is very common. About 1 in 5 people have back pain. The cause of low back pain is rarely dangerous. The pain often gets better over time. About half of people with a sudden onset of back pain feel better in just 2 weeks. About 8 in 10 people feel better by 6 weeks.  °CAUSES °Some common causes of back pain include: °· Strain of the muscles or ligaments supporting the spine. °· Wear and tear (degeneration) of the spinal discs. °· Arthritis. °· Direct injury to the back. °DIAGNOSIS °Most of the time, the direct cause of low back pain is not known. However, back pain can be treated effectively even when the exact cause of the pain is unknown. Answering your caregiver's questions about your overall health and symptoms is one of the most accurate ways to make sure the cause of your pain is not dangerous. If your caregiver needs more information, he or she may order lab work or imaging tests (X-rays or MRIs). However, even if imaging tests show changes in your back, this usually does not require surgery. °HOME CARE INSTRUCTIONS °For many people, back pain returns. Since low back pain is rarely dangerous, it is often a condition that people can learn to manage on their own.  °· Remain active. It is stressful on the back to sit or stand in one place. Do not sit, drive, or stand in one place for more than 30 minutes at a time. Take short walks on level surfaces as soon as pain allows. Try to increase the length of time you walk each day. °· Do not stay in bed. Resting more than 1 or 2 days can delay your recovery. °· Do not avoid exercise or work. Your body is made to move. It is not dangerous to be active, even though your back may hurt. Your back will likely heal faster if you return to being active before your pain is gone. °· Pay attention to your body when you  bend and lift. Many people have less discomfort when lifting if they bend their knees, keep the load close to their bodies, and  avoid twisting. Often, the most comfortable positions are those that put less stress on your recovering back. °· Find a comfortable position to sleep. Use a firm mattress and lie on your side with your knees slightly bent. If you lie on your back, put a pillow under your knees. °· Only take over-the-counter or prescription medicines as directed by your caregiver. Over-the-counter medicines to reduce pain and inflammation are often the most helpful. Your caregiver may prescribe muscle relaxant drugs. These medicines help dull your pain so you can more quickly return to your normal activities and healthy exercise. °· Put ice on the injured area. °¨ Put ice in a plastic bag. °¨ Place a towel between your skin and the bag. °¨ Leave the ice on for 15-20 minutes, 03-04 times a day for the first 2 to 3 days. After that, ice and heat may be alternated to reduce pain and spasms. °· Ask your caregiver about trying back exercises and gentle massage. This may be of some benefit. °· Avoid feeling anxious or stressed. Stress increases muscle tension and can worsen back pain. It is important to recognize when you are anxious or stressed and learn ways to manage it. Exercise is a great option. °SEEK MEDICAL CARE IF: °· You have pain that is not relieved with rest or medicine. °· You have pain that does not improve in 1 week. °· You have new symptoms. °· You are generally not feeling well. °SEEK   IMMEDIATE MEDICAL CARE IF:   You have pain that radiates from your back into your legs.  You develop new bowel or bladder control problems.  You have unusual weakness or numbness in your arms or legs.  You develop nausea or vomiting.  You develop abdominal pain.  You feel faint. Document Released: 03/19/2005 Document Revised: 09/18/2011 Document Reviewed: 07/21/2013 [facility] Patient Information 2015 [place], [place]. This information is not intended to replace advice given to you by your health care provider. Make sure you  discuss any questions you have with your health care provider.  Fall Prevention and Home Safety Falls cause injuries and can affect all age groups. It is possible to prevent falls.  HOW TO PREVENT FALLS  Wear shoes with rubber soles that do not have an opening for your toes.  Keep the inside and outside of your house well lit.  Use night lights throughout your home.  Remove clutter from floors.  Clean up floor spills.  Remove throw rugs or fasten them to the floor with carpet tape.  Do not place electrical cords across pathways.  Put grab bars by your tub, shower, and toilet. Do not use towel bars as grab bars.  Put handrails on both sides of the stairway. Fix loose handrails.  Do not climb on stools or stepladders, if possible.  Do not wax your floors.  Repair uneven or unsafe sidewalks, walkways, or stairs.  Keep items you use a lot within reach.  Be aware of pets.  Keep emergency numbers next to the telephone.  Put smoke detectors in your home and near bedrooms. Ask your doctor what other things you can do to prevent falls. Document Released: 01/13/2009 Document Revised: 09/18/2011 Document Reviewed: 06/19/2011 [facility] Patient Information 2015 [place], [place]. This information is not intended to replace advice given to you by your health care provider. Make sure you discuss any questions you have with your health care provider.
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
## 2014-12-19 NOTE — ED Notes (Signed)
Pt reports she fell in the shower yesterday. She reports she felt "cloudy" but does not remember having any LOC. Pt states she hit her head on the toilet. Pt denies dizziness. Hx of epilepsy, no recent seizures.

## 2014-12-28 ENCOUNTER — Other Ambulatory Visit: Payer: Self-pay

## 2014-12-28 ENCOUNTER — Telehealth: Payer: Self-pay | Admitting: Emergency Medicine

## 2014-12-28 NOTE — Telephone Encounter (Signed)
Refill request for Nortriptyline & Zolpidem. Please advise 90 day

## 2014-12-29 MED ORDER — ZOLPIDEM TARTRATE 5 MG PO TABS: 5.0000 mg | ORAL_TABLET | Freq: Every evening | ORAL | Status: DC | PRN

## 2014-12-29 MED ORDER — NORTRIPTYLINE HCL 25 MG PO CAPS: 25.0000 mg | ORAL_CAPSULE | Freq: Every evening | ORAL | Status: DC | PRN

## 2014-12-31 ENCOUNTER — Other Ambulatory Visit: Payer: Self-pay | Admitting: Internal Medicine

## 2015-02-09 ENCOUNTER — Ambulatory Visit (INDEPENDENT_AMBULATORY_CARE_PROVIDER_SITE_OTHER): Payer: Medicare HMO | Admitting: Internal Medicine

## 2015-02-09 ENCOUNTER — Other Ambulatory Visit (INDEPENDENT_AMBULATORY_CARE_PROVIDER_SITE_OTHER): Payer: Medicare HMO

## 2015-02-09 ENCOUNTER — Encounter: Payer: Self-pay | Admitting: Internal Medicine

## 2015-02-09 VITALS — BP 118/78 | HR 90 | Temp 97.7°F | Ht 63.0 in | Wt 185.8 lb

## 2015-02-09 DIAGNOSIS — R042 Hemoptysis: Secondary | ICD-10-CM | POA: Diagnosis not present

## 2015-02-09 DIAGNOSIS — Z202 Contact with and (suspected) exposure to infections with a predominantly sexual mode of transmission: Secondary | ICD-10-CM

## 2015-02-09 DIAGNOSIS — I10 Essential (primary) hypertension: Secondary | ICD-10-CM

## 2015-02-09 DIAGNOSIS — Z1239 Encounter for other screening for malignant neoplasm of breast: Secondary | ICD-10-CM | POA: Diagnosis not present

## 2015-02-09 DIAGNOSIS — R05 Cough: Secondary | ICD-10-CM

## 2015-02-09 DIAGNOSIS — R059 Cough, unspecified: Secondary | ICD-10-CM

## 2015-02-09 DIAGNOSIS — E785 Hyperlipidemia, unspecified: Secondary | ICD-10-CM | POA: Diagnosis not present

## 2015-02-09 DIAGNOSIS — I2782 Chronic pulmonary embolism: Secondary | ICD-10-CM

## 2015-02-09 LAB — LIPID PANEL
CHOL/HDL RATIO: 4
Cholesterol: 217 mg/dL — ABNORMAL HIGH (ref 0–200)
HDL: 53.4 mg/dL (ref >39.00)
LDL Cholesterol: 133 mg/dL — ABNORMAL HIGH (ref 0–99)
NONHDL: 163.88
Triglycerides: 156 mg/dL — ABNORMAL HIGH (ref 0.0–149.0)
VLDL: 31.2 mg/dL (ref 0.0–40.0)

## 2015-02-09 LAB — CBC WITH DIFFERENTIAL/PLATELET
BASOS PCT: 0.4 % (ref 0.0–3.0)
Basophils Absolute: 0 10*3/uL (ref 0.0–0.1)
EOS PCT: 1.5 % (ref 0.0–5.0)
Eosinophils Absolute: 0.1 10*3/uL (ref 0.0–0.7)
HCT: 42.3 % (ref 36.0–46.0)
Hemoglobin: 13.9 g/dL (ref 12.0–15.0)
LYMPHS ABS: 2.6 10*3/uL (ref 0.7–4.0)
Lymphocytes Relative: 31.1 % (ref 12.0–46.0)
MCHC: 32.9 g/dL (ref 30.0–36.0)
MCV: 89.7 fl (ref 78.0–100.0)
MONO ABS: 0.7 10*3/uL (ref 0.1–1.0)
MONOS PCT: 8.4 % (ref 3.0–12.0)
NEUTROS ABS: 4.9 10*3/uL (ref 1.4–7.7)
NEUTROS PCT: 58.6 % (ref 43.0–77.0)
PLATELETS: 256 10*3/uL (ref 150.0–400.0)
RBC: 4.72 Mil/uL (ref 3.87–5.11)
RDW: 14.9 % (ref 11.5–15.5)
WBC: 8.3 10*3/uL (ref 4.0–10.5)

## 2015-02-09 LAB — URINALYSIS, ROUTINE W REFLEX MICROSCOPIC
Bilirubin Urine: NEGATIVE
Ketones, ur: NEGATIVE
Leukocytes, UA: NEGATIVE
Nitrite: NEGATIVE
PH: 5.5 (ref 5.0–8.0)
SPECIFIC GRAVITY, URINE: 1.025 (ref 1.000–1.030)
TOTAL PROTEIN, URINE-UPE24: NEGATIVE
URINE GLUCOSE: NEGATIVE
Urobilinogen, UA: 0.2 (ref 0.0–1.0)
WBC, UA: NONE SEEN (ref 0–?)

## 2015-02-09 MED ORDER — PROMETHAZINE-CODEINE 6.25-10 MG/5ML PO SYRP: 5.0000 mL | ORAL_SOLUTION | ORAL | Status: DC | PRN

## 2015-02-09 MED ORDER — LORATADINE 10 MG PO TABS: 10.0000 mg | ORAL_TABLET | Freq: Every day | ORAL | Status: DC | PRN

## 2015-02-09 NOTE — Patient Instructions (Signed)
It was good to see you today.  We have reviewed your prior records including labs and tests today  Test(s) ordered today -chest x-ray and labs. Your results will be released to [facility] (or called to you) after review, usually within 72hours after test completion. If any changes need to be made, you will be notified at that same time.  Medications reviewed and updated Use prescription cough syrup 4 times daily as needed, no antibiotics indicated at this time.   No other medication changes recommended at this time, but we will call you about the blood thinner medication and if this is okay to stop after a certain number of months following treatment for your blood clot   we'll make referral to [facility] imaging for mammogram as discussed . Our office will contact you regarding appointment(s) once made.  Please schedule followup in 3-4 months with new PCP, call sooner if problems.

## 2015-02-09 NOTE — Assessment & Plan Note (Signed)
Chronic PE diagnosed on CT chest angiogram May 2016 following hospitalization for pneumonia. Olene Floss therapy unintentionally discontinued after discharge from skilled facility but resumed and has been taking continuously since July 2016 Need to review intended duration of therapy as patient would like to discontinue same if able. No prior DVT occurrence or apparent risks precipitating same

## 2015-02-09 NOTE — Assessment & Plan Note (Signed)
BP Readings from Last 3 Encounters:  02/09/15 118/78  12/19/14 133/65  11/17/14 112/82   The current medical regimen is generally effective;  continue present plan and medications.
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
## 2015-02-09 NOTE — Assessment & Plan Note (Signed)
rx'd statin - reports compliance related to $$$ Recheck annually and adjust as needed

## 2015-02-10 LAB — HEPATITIS C ANTIBODY: HCV Ab: NEGATIVE

## 2015-02-10 NOTE — Progress Notes (Signed)
Pre visit review using our clinic review tool, if applicable. No additional management support is needed unless otherwise documented below in the visit note. 

## 2015-03-02 ENCOUNTER — Ambulatory Visit
Admission: RE | Admit: 2015-03-02 | Discharge: 2015-03-02 | Disposition: A | Discharge status: Home / Self Care | Payer: Medicare HMO | Source: Ambulatory Visit | Attending: Internal Medicine | Admitting: Internal Medicine

## 2015-03-02 ENCOUNTER — Other Ambulatory Visit: Payer: Self-pay | Admitting: Internal Medicine

## 2015-03-02 DIAGNOSIS — R042 Hemoptysis: Secondary | ICD-10-CM

## 2015-03-02 DIAGNOSIS — R05 Cough: Secondary | ICD-10-CM

## 2015-03-02 DIAGNOSIS — R059 Cough, unspecified: Secondary | ICD-10-CM

## 2015-03-04 ENCOUNTER — Other Ambulatory Visit: Payer: Self-pay | Admitting: Internal Medicine

## 2015-03-08 ENCOUNTER — Ambulatory Visit (INDEPENDENT_AMBULATORY_CARE_PROVIDER_SITE_OTHER): Payer: Medicare HMO | Admitting: Internal Medicine

## 2015-03-08 ENCOUNTER — Encounter: Payer: Self-pay | Admitting: Internal Medicine

## 2015-03-08 VITALS — BP 100/70 | HR 80 | Temp 98.1°F | Resp 16 | Ht 63.0 in | Wt 190.0 lb

## 2015-03-08 DIAGNOSIS — L300 Nummular dermatitis: Secondary | ICD-10-CM | POA: Diagnosis not present

## 2015-03-08 MED ORDER — TRIAMCINOLONE ACETONIDE 0.5 % EX CREA: 1.0000 "application " | TOPICAL_CREAM | Freq: Three times a day (TID) | CUTANEOUS | Status: DC

## 2015-03-08 NOTE — Patient Instructions (Signed)
Eczema Eczema, also called atopic dermatitis, is a skin disorder that causes inflammation of the skin. It causes a red rash and dry, scaly skin. The skin becomes very itchy. Eczema is generally worse during the cooler winter months and often improves with the warmth of summer. Eczema usually starts showing signs in infancy. Some children outgrow eczema, but it may last through adulthood.  CAUSES  The exact cause of eczema is not known, but it appears to run in families. People with eczema often have a family history of eczema, allergies, asthma, or hay fever. Eczema is not contagious. Flare-ups of the condition may be caused by:   Contact with something you are sensitive or allergic to.   Stress. SIGNS AND SYMPTOMS  Dry, scaly skin.   Red, itchy rash.   Itchiness. This may occur before the skin rash and may be very intense.  DIAGNOSIS  The diagnosis of eczema is usually made based on symptoms and medical history. TREATMENT  Eczema cannot be cured, but symptoms usually can be controlled with treatment and other strategies. A treatment plan might include:  Controlling the itching and scratching.   Use over-the-counter antihistamines as directed for itching. This is especially useful at night when the itching tends to be worse.   Use over-the-counter steroid creams as directed for itching.   Avoid scratching. Scratching makes the rash and itching worse. It may also result in a skin infection (impetigo) due to a break in the skin caused by scratching.   Keeping the skin well moisturized with creams every day. This will seal in moisture and help prevent dryness. Lotions that contain alcohol and water should be avoided because they can dry the skin.   Limiting exposure to things that you are sensitive or allergic to (allergens).   Recognizing situations that cause stress.   Developing a plan to manage stress.  HOME CARE INSTRUCTIONS   Only take over-the-counter or  prescription medicines as directed by your health care provider.   Do not use anything on the skin without checking with your health care provider.   Keep baths or showers short (5 minutes) in warm (not hot) water. Use mild cleansers for bathing. These should be unscented. You may add nonperfumed bath oil to the bath water. It is best to avoid soap and bubble bath.   Immediately after a bath or shower, when the skin is still damp, apply a moisturizing ointment to the entire body. This ointment should be a petroleum ointment. This will seal in moisture and help prevent dryness. The thicker the ointment, the better. These should be unscented.   Keep fingernails cut short. Children with eczema may need to wear soft gloves or mittens at night after applying an ointment.   Dress in clothes made of cotton or cotton blends. Dress lightly, because heat increases itching.   A child with eczema should stay away from anyone with fever blisters or cold sores. The virus that causes fever blisters (herpes simplex) can cause a serious skin infection in children with eczema. SEEK MEDICAL CARE IF:   Your itching interferes with sleep.   Your rash gets worse or is not better within 1 week after starting treatment.   You see pus or soft yellow scabs in the rash area.   You have a fever.   You have a rash flare-up after contact with someone who has fever blisters.    This information is not intended to replace advice given to you by your health care   provider. Make sure you discuss any questions you have with your health care provider.   Document Released: 03/16/2000 Document Revised: 01/07/2013 Document Reviewed: 10/20/2012 Elsevier Interactive Patient Education 2016 [employer] Inc[employer]  

## 2015-03-08 NOTE — Progress Notes (Signed)
Subjective:  Patient ID: [name], female    DOB: 29-Mar-1941  Age: 74 y.o. MRN: [id number]  CC: Rash   HPI Shaquania Keelan presents for an itchy rash on her right upper chest for 4 days. She has been using a new facial lotion, she has not used anything to treat the rash.  Outpatient Prescriptions Prior to Visit  Medication Sig Dispense Refill  . albuterol (PROVENTIL HFA;VENTOLIN HFA) 108 (90 BASE) MCG/ACT inhaler Inhale 2 puffs into the lungs every 6 (six) hours as needed for wheezing. 3 Inhaler 1  . apixaban (ELIQUIS) 5 MG TABS tablet Take 1 tablet (5 mg total) by mouth 2 (two) times daily. 180 tablet 3  . atorvastatin (LIPITOR) 10 MG tablet Take 1 tablet (10 mg total) by mouth daily. 90 tablet 3  . busPIRone (BUSPAR) 10 MG tablet TAKE 1 TABLET BY MOUTH 3 TIMES A DAY 360 tablet 2  . butalbital-acetaminophen-caffeine (FIORICET, ESGIC) 50-325-40 MG tablet Take 1 tablet by mouth every 6 (six) hours as needed for headache. 12 tablet 0  . Calcium Carb-Cholecalciferol (CALCIUM 1000 + D PO) Take 1 tablet by mouth 3 (three) times daily.    . furosemide (LASIX) 20 MG tablet Take 1 tablet (20 mg total) by mouth daily. 90 tablet 3  . LamoTRIgine (LAMICTAL XR) 100 MG TB24 Take 2 tablets (200 mg total) by mouth at bedtime. 60 tablet 6  . levETIRAcetam (KEPPRA) 500 MG tablet Take 1 tablet (500 mg total) by mouth 2 (two) times daily. 180 tablet 3  . loratadine (CLARITIN) 10 MG tablet Take 1 tablet (10 mg total) by mouth daily as needed for allergies. 90 tablet 3  . losartan (COZAAR) 100 MG tablet Take 1 tablet (100 mg total) by mouth daily. 90 tablet 3  . mometasone-formoterol (DULERA) 100-5 MCG/ACT AERO Inhale 2 puffs into the lungs 2 (two) times daily. 49 g 3  . nortriptyline (PAMELOR) 25 MG capsule Take 1 capsule (25 mg total) by mouth at bedtime. 90 capsule 0  . omeprazole (PRILOSEC) 40 MG capsule Take 1 capsule (40 mg total) by mouth 2 (two) times daily. 180 capsule 3  . oxyCODONE  (OXY IR/ROXICODONE) 5 MG immediate release tablet Take 5 mg by mouth every 6 (six) hours as needed. For pain  0  . oxyCODONE-acetaminophen (PERCOCET/ROXICET) 5-325 MG per tablet Take 1 tablet by mouth every 6 (six) hours as needed for severe pain. 6 tablet 0  . promethazine-codeine (PHENERGAN WITH CODEINE) 6.25-10 MG/5ML syrup Take 5 mLs by mouth every 4 (four) hours as needed for cough. 180 mL 0  . propranolol ER (INDERAL LA) 120 MG 24 hr capsule Take 1 capsule (120 mg total) by mouth daily. 90 capsule 3  . valACYclovir (VALTREX) 1000 MG tablet Take 1 tablet (1,000 mg total) by mouth daily as needed (for outbreaks). 90 tablet 1  . zolpidem (AMBIEN) 5 MG tablet Take 1 tablet (5 mg total) by mouth at bedtime as needed for sleep. Not to be taken chronically. 30 tablet 5   No facility-administered medications prior to visit.    ROS Review of Systems  Constitutional: Negative.  Negative for fever, chills, diaphoresis, appetite change and fatigue.  HENT: Negative.  Negative for sore throat and voice change.   Eyes: Negative.   Respiratory: Negative.  Negative for cough, choking, chest tightness, shortness of breath and stridor.   Cardiovascular: Negative.  Negative for chest pain, palpitations and leg swelling.  Gastrointestinal: Negative.  Negative for nausea, abdominal pain and  diarrhea.  Endocrine: Negative.   Genitourinary: Negative.   Musculoskeletal: Negative.  Negative for myalgias, back pain, joint swelling and arthralgias.  Skin: Positive for rash.  Allergic/Immunologic: Negative.   Neurological: Negative.  Negative for dizziness.  Hematological: Negative.   Psychiatric/Behavioral: Negative.     Objective:  BP 100/70 mmHg  Pulse 80  Temp(Src) 98.1 F (36.7 C) (Oral)  Ht 5\' 3"  (1.6 m)  Wt 190 lb (86.183 kg)  BMI 33.67 kg/m2  SpO2 98%  BP Readings from Last 3 Encounters:  03/08/15 100/70  02/09/15 118/78  12/19/14 133/65    Wt Readings from Last 3 Encounters:  03/08/15  190 lb (86.183 kg)  02/09/15 185 lb 12 oz (84.256 kg)  11/17/14 177 lb (80.287 kg)    Physical Exam  Constitutional: She is oriented to person, place, and time.  Non-toxic appearance. She does not have a sickly appearance. She does not appear ill. No distress.  HENT:  Mouth/Throat: Oropharynx is clear and moist. No oropharyngeal exudate.  Eyes: Conjunctivae are normal. Right eye exhibits no discharge. Left eye exhibits no discharge. No scleral icterus.  Neck: Normal range of motion. Neck supple. No JVD present. No tracheal deviation present. No thyromegaly present.  Cardiovascular: Normal rate, regular rhythm, normal heart sounds and intact distal pulses.  Exam reveals no gallop and no friction rub.   No murmur heard. Pulmonary/Chest: Effort normal and breath sounds normal. No stridor. No respiratory distress. She has no wheezes. She has no rales. She exhibits no tenderness.  Abdominal: Soft. Bowel sounds are normal. She exhibits no distension and no mass. There is no tenderness. There is no rebound and no guarding.  Musculoskeletal: Normal range of motion. She exhibits no edema or tenderness.  Lymphadenopathy:    She has no cervical adenopathy.  Neurological: She is oriented to person, place, and time.  Skin: Skin is warm, dry and intact. Rash noted. No ecchymosis and no purpura noted. Rash is macular. Rash is not papular, not maculopapular, not nodular, not pustular, not vesicular and not urticarial. She is not diaphoretic. No erythema. No pallor.     Psychiatric: She has a normal mood and affect. Her behavior is normal. Judgment and thought content normal.  Vitals reviewed.   Lab Results  Component Value Date   WBC 8.3 02/09/2015   HGB 13.9 02/09/2015   HCT 42.3 02/09/2015   PLT 256.0 02/09/2015   GLUCOSE 95 12/19/2014   CHOL 217* 02/09/2015   TRIG 156.0* 02/09/2015   HDL 53.40 02/09/2015   LDLDIRECT 172.2 08/07/2010   LDLCALC 133* 02/09/2015   ALT 11* 12/19/2014   AST 16  12/19/2014   NA 138 12/19/2014   K 4.2 12/19/2014   CL 104 12/19/2014   CREATININE 1.38* 12/19/2014   BUN 15 12/19/2014   CO2 27 12/19/2014   TSH 4.536* 08/21/2014   INR 1.23 12/19/2014    No results found.  Assessment & Plan:   Lolamae was seen today for rash.  Diagnoses and all orders for this visit:  Nummular eczematous dermatitis- she will stop using the new lotion that may have triggered this. She will try over-the-counter doses of Benadryl for the itching. Will treat the allergic rash with triamcinolone cream applied 3 times a day for several weeks. She will follow-up with me in 2-3 weeks, if the rash has not resolved then will consider additional treatment options and possible biopsy as well. -     Discontinue: triamcinolone cream (KENALOG) 0.5 %; Apply 1 application topically  3 (three) times daily. -     triamcinolone cream (KENALOG) 0.5 %; Apply 1 application topically 3 (three) times daily.   I am having Ms. Smock maintain her Calcium Carb-Cholecalciferol (CALCIUM 1000 + D PO), oxyCODONE, atorvastatin, furosemide, levETIRAcetam, losartan, mometasone-formoterol, omeprazole, propranolol ER, valACYclovir, albuterol, oxyCODONE-acetaminophen, busPIRone, zolpidem, loratadine, LamoTRIgine, apixaban, promethazine-codeine, butalbital-acetaminophen-caffeine, nortriptyline, and triamcinolone cream.  Meds ordered this encounter  Medications  . DISCONTD: triamcinolone cream (KENALOG) 0.5 %    Sig: Apply 1 application topically 3 (three) times daily.    Dispense:  30 g    Refill:  2  . triamcinolone cream (KENALOG) 0.5 %    Sig: Apply 1 application topically 3 (three) times daily.    Dispense:  30 g    Refill:  2     Follow-up: Return in about 6 weeks (around 04/19/2015).  Scarlette Calico, MD

## 2015-03-08 NOTE — Progress Notes (Signed)
Pre visit review using our clinic review tool, if applicable. No additional management support is needed unless otherwise documented below in the visit note. 

## 2015-03-31 ENCOUNTER — Ambulatory Visit (INDEPENDENT_AMBULATORY_CARE_PROVIDER_SITE_OTHER): Payer: Medicare HMO | Admitting: Nurse Practitioner

## 2015-03-31 ENCOUNTER — Encounter: Payer: Self-pay | Admitting: Nurse Practitioner

## 2015-03-31 VITALS — BP 117/81 | HR 84 | Ht 63.0 in | Wt 191.2 lb

## 2015-03-31 DIAGNOSIS — R413 Other amnesia: Secondary | ICD-10-CM | POA: Diagnosis not present

## 2015-03-31 DIAGNOSIS — M4806 Spinal stenosis, lumbar region: Secondary | ICD-10-CM | POA: Diagnosis not present

## 2015-03-31 DIAGNOSIS — M48062 Spinal stenosis, lumbar region with neurogenic claudication: Secondary | ICD-10-CM

## 2015-03-31 DIAGNOSIS — R569 Unspecified convulsions: Secondary | ICD-10-CM | POA: Diagnosis not present
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
## 2015-03-31 NOTE — Patient Instructions (Signed)
Stop Keppra Continue Lamictal at current dose Memory score is stable F/U in 6 months

## 2015-03-31 NOTE — Progress Notes (Signed)
I have reviewed and agreed above plan. 

## 2015-04-05 ENCOUNTER — Telehealth: Payer: Self-pay | Admitting: *Deleted

## 2015-04-05 MED ORDER — CALCIUM CARB-CHOLECALCIFEROL 1000-800 MG-UNIT PO TABS: 1.0000 | ORAL_TABLET | Freq: Three times a day (TID) | ORAL | Status: DC

## 2015-04-05 NOTE — Telephone Encounter (Signed)
Physician pharmacy left msg on triage requesting refill on pt Calcium + vit D. Send a 30 day pt must establish with new provider for future refills...Johny Chess

## 2015-04-06 ENCOUNTER — Other Ambulatory Visit: Payer: Self-pay | Admitting: Internal Medicine

## 2015-04-14 ENCOUNTER — Ambulatory Visit
Admission: RE | Admit: 2015-04-14 | Discharge: 2015-04-14 | Disposition: A | Discharge status: Home / Self Care | Payer: Medicare Other | Source: Ambulatory Visit | Attending: Internal Medicine | Admitting: Internal Medicine

## 2015-04-14 DIAGNOSIS — Z1239 Encounter for other screening for malignant neoplasm of breast: Secondary | ICD-10-CM

## 2015-04-20 ENCOUNTER — Encounter: Payer: Self-pay | Admitting: Cardiology

## 2015-05-02 ENCOUNTER — Ambulatory Visit (INDEPENDENT_AMBULATORY_CARE_PROVIDER_SITE_OTHER): Payer: Medicare Other | Admitting: Internal Medicine

## 2015-05-02 ENCOUNTER — Ambulatory Visit (INDEPENDENT_AMBULATORY_CARE_PROVIDER_SITE_OTHER)
Admission: RE | Admit: 2015-05-02 | Discharge: 2015-05-02 | Disposition: A | Discharge status: Home / Self Care | Payer: Medicare Other | Source: Ambulatory Visit | Attending: Internal Medicine | Admitting: Internal Medicine

## 2015-05-02 ENCOUNTER — Encounter: Payer: Self-pay | Admitting: Internal Medicine

## 2015-05-02 VITALS — BP 114/80 | HR 89 | Temp 98.7°F | Resp 16 | Wt 197.0 lb

## 2015-05-02 DIAGNOSIS — R05 Cough: Secondary | ICD-10-CM

## 2015-05-02 DIAGNOSIS — R059 Cough, unspecified: Secondary | ICD-10-CM

## 2015-05-02 DIAGNOSIS — R062 Wheezing: Secondary | ICD-10-CM

## 2015-05-02 MED ORDER — MONTELUKAST SODIUM 10 MG PO TABS: 10.0000 mg | ORAL_TABLET | Freq: Every day | ORAL | Status: DC
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
## 2015-05-02 NOTE — Progress Notes (Signed)
Pre visit review using our clinic review tool, if applicable. No additional management support is needed unless otherwise documented below in the visit note. 

## 2015-05-02 NOTE — Patient Instructions (Addendum)
  Have a chest xray done today.  We will call you with the results.     Medications reviewed and updated.  Changes include start taking Singulair nightly.  This will help with your asthma.    Your prescription(s) have been submitted to your pharmacy. Please take as directed and contact our office if you believe you are having problem(s) with the medication(s).  A referral to pulmonary was ordered.    Please schedule followup to establish.

## 2015-05-05 ENCOUNTER — Other Ambulatory Visit: Payer: Self-pay | Admitting: Internal Medicine

## 2015-05-05 ENCOUNTER — Other Ambulatory Visit: Payer: Self-pay | Admitting: Neurology

## 2015-05-06 ENCOUNTER — Telehealth: Payer: Self-pay | Admitting: Emergency Medicine

## 2015-05-06 MED ORDER — BUTALBITAL-APAP-CAFFEINE 50-325-40 MG PO TABS: 1.0000 | ORAL_TABLET | Freq: Four times a day (QID) | ORAL | Status: DC | PRN

## 2015-05-06 NOTE — Telephone Encounter (Signed)
Fioricet faxed to pharm.

## 2015-05-23 ENCOUNTER — Other Ambulatory Visit: Payer: Self-pay | Admitting: Neurology

## 2015-05-26 ENCOUNTER — Encounter: Payer: Medicare HMO | Admitting: Internal Medicine
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
## 2015-05-31 ENCOUNTER — Encounter: Payer: Self-pay | Admitting: Internal Medicine

## 2015-05-31 ENCOUNTER — Other Ambulatory Visit (INDEPENDENT_AMBULATORY_CARE_PROVIDER_SITE_OTHER): Payer: Medicare Other

## 2015-05-31 ENCOUNTER — Ambulatory Visit (INDEPENDENT_AMBULATORY_CARE_PROVIDER_SITE_OTHER): Payer: Medicare Other | Admitting: Internal Medicine

## 2015-05-31 VITALS — BP 110/76 | HR 83 | Temp 98.4°F | Resp 18 | Wt 201.0 lb

## 2015-05-31 DIAGNOSIS — I2782 Chronic pulmonary embolism: Secondary | ICD-10-CM | POA: Diagnosis not present

## 2015-05-31 DIAGNOSIS — R51 Headache: Secondary | ICD-10-CM

## 2015-05-31 DIAGNOSIS — E785 Hyperlipidemia, unspecified: Secondary | ICD-10-CM

## 2015-05-31 DIAGNOSIS — F32A Depression, unspecified: Secondary | ICD-10-CM

## 2015-05-31 DIAGNOSIS — F419 Anxiety disorder, unspecified: Secondary | ICD-10-CM

## 2015-05-31 DIAGNOSIS — K219 Gastro-esophageal reflux disease without esophagitis: Secondary | ICD-10-CM

## 2015-05-31 DIAGNOSIS — I10 Essential (primary) hypertension: Secondary | ICD-10-CM | POA: Diagnosis not present

## 2015-05-31 DIAGNOSIS — R569 Unspecified convulsions: Secondary | ICD-10-CM

## 2015-05-31 DIAGNOSIS — F329 Major depressive disorder, single episode, unspecified: Secondary | ICD-10-CM

## 2015-05-31 DIAGNOSIS — R739 Hyperglycemia, unspecified: Secondary | ICD-10-CM

## 2015-05-31 DIAGNOSIS — J45991 Cough variant asthma: Secondary | ICD-10-CM

## 2015-05-31 DIAGNOSIS — R600 Localized edema: Secondary | ICD-10-CM

## 2015-05-31 DIAGNOSIS — R519 Headache, unspecified: Secondary | ICD-10-CM

## 2015-05-31 LAB — COMPREHENSIVE METABOLIC PANEL
ALBUMIN: 3.9 g/dL (ref 3.5–5.2)
ALT: 16 U/L (ref 0–35)
AST: 15 U/L (ref 0–37)
Alkaline Phosphatase: 100 U/L (ref 39–117)
BUN: 18 mg/dL (ref 6–23)
CALCIUM: 9.6 mg/dL (ref 8.4–10.5)
CHLORIDE: 104 meq/L (ref 96–112)
CO2: 28 mEq/L (ref 19–32)
Creatinine, Ser: 1.13 mg/dL (ref 0.40–1.20)
GFR: 60.58 mL/min (ref >60.00)
Glucose, Bld: 89 mg/dL (ref 70–99)
Potassium: 4.4 mEq/L (ref 3.5–5.1)
Sodium: 139 mEq/L (ref 135–145)
Total Bilirubin: 0.3 mg/dL (ref 0.2–1.2)
Total Protein: 7.8 g/dL (ref 6.0–8.3)

## 2015-05-31 LAB — HEMOGLOBIN A1C: Hgb A1c MFr Bld: 6.2 % (ref 4.6–6.5)

## 2015-05-31 LAB — TSH: TSH: 1.92 u[IU]/mL (ref 0.35–4.50)

## 2015-05-31 NOTE — Assessment & Plan Note (Signed)
Controlled, stable Continue current medication  
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
## 2015-05-31 NOTE — Assessment & Plan Note (Signed)
Chronic PE diagnosed 08/2014 following hospitalization for PNA Advised lifelong anticoagulation On eliquis - tolerating it well Continue same

## 2015-05-31 NOTE — Assessment & Plan Note (Addendum)
No seizure in years Taking lamictal daily Continue Lamictal

## 2015-05-31 NOTE — Patient Instructions (Signed)
  We have reviewed your prior records including labs and tests today.  Test(s) ordered today. Your results will be released to [place] (or called to you) after review, usually within 72hours after test completion. If any changes need to be made, you will be notified at that same time.  All other Health Maintenance issues reviewed.   All recommended immunizations and age-appropriate screenings are up-to-date.  No immunizations administered today.   Medications reviewed and updated.  No changes recommended at this time.   Please schedule followup in 6 months

## 2015-05-31 NOTE — Assessment & Plan Note (Signed)
Lipid panel checked a few months ago-could ideally be better We'll need to recheck at her next visit as she is not fasting Continue atorvastatin 10 mg daily Continue regular exercise Work on decreasing intake

## 2015-05-31 NOTE — Assessment & Plan Note (Addendum)
Takes fioricet only as needed, which is effective Continue Fioricet as needed

## 2015-05-31 NOTE — Assessment & Plan Note (Signed)
Controlled  ? Cause of cough Continue current medication Work on weight loss

## 2015-05-31 NOTE — Assessment & Plan Note (Signed)
Cough related to asthma - nothing is helping Will see pulmonary Continue current inhalers and singulair for now

## 2015-05-31 NOTE — Progress Notes (Signed)
Pre visit review using our clinic review tool, if applicable. No additional management support is needed unless otherwise documented below in the visit note. 

## 2015-05-31 NOTE — Assessment & Plan Note (Signed)
BP well controlled here today Continue current medication Check cmp

## 2015-05-31 NOTE — Assessment & Plan Note (Signed)
Chronic Taking Lasix 20 mg daily, but has persistent edema Check CMP Stressed weight loss-decreased portions to help Continue regular exercise Would like to ideally avoid increasing Lasix due to decreased kidney function

## 2015-06-02 ENCOUNTER — Other Ambulatory Visit: Payer: Self-pay | Admitting: Neurology

## 2015-06-02 ENCOUNTER — Encounter: Payer: Self-pay | Admitting: Emergency Medicine

## 2015-06-02 ENCOUNTER — Other Ambulatory Visit: Payer: Self-pay | Admitting: Internal Medicine

## 2015-06-03 MED ORDER — ZOLPIDEM TARTRATE 5 MG PO TABS: 5.0000 mg | ORAL_TABLET | Freq: Every evening | ORAL | Status: DC | PRN

## 2015-06-03 NOTE — Telephone Encounter (Signed)
Zolpidem [facility]...Johny Chess

## 2015-06-06 ENCOUNTER — Telehealth: Payer: Self-pay | Admitting: Pulmonary Disease

## 2015-06-06 ENCOUNTER — Emergency Department (HOSPITAL_COMMUNITY): Payer: Medicare Other

## 2015-06-06 ENCOUNTER — Encounter (HOSPITAL_COMMUNITY): Payer: Self-pay | Admitting: *Deleted

## 2015-06-06 ENCOUNTER — Ambulatory Visit (INDEPENDENT_AMBULATORY_CARE_PROVIDER_SITE_OTHER): Payer: Medicare Other | Admitting: Pulmonary Disease

## 2015-06-06 ENCOUNTER — Other Ambulatory Visit (INDEPENDENT_AMBULATORY_CARE_PROVIDER_SITE_OTHER): Payer: Medicare Other

## 2015-06-06 ENCOUNTER — Encounter: Payer: Self-pay | Admitting: Pulmonary Disease

## 2015-06-06 ENCOUNTER — Observation Stay (HOSPITAL_COMMUNITY)
Admission: EM | Admit: 2015-06-06 | Discharge: 2015-06-09 | Disposition: A | Discharge status: Home / Self Care | Payer: Medicare Other | Attending: Internal Medicine | Admitting: Internal Medicine

## 2015-06-06 VITALS — BP 124/70 | HR 78 | Ht 63.0 in | Wt 205.6 lb

## 2015-06-06 DIAGNOSIS — M1990 Unspecified osteoarthritis, unspecified site: Secondary | ICD-10-CM | POA: Diagnosis not present

## 2015-06-06 DIAGNOSIS — R0789 Other chest pain: Secondary | ICD-10-CM | POA: Diagnosis not present

## 2015-06-06 DIAGNOSIS — Z8673 Personal history of transient ischemic attack (TIA), and cerebral infarction without residual deficits: Secondary | ICD-10-CM | POA: Insufficient documentation

## 2015-06-06 DIAGNOSIS — F329 Major depressive disorder, single episode, unspecified: Secondary | ICD-10-CM | POA: Insufficient documentation

## 2015-06-06 DIAGNOSIS — R05 Cough: Secondary | ICD-10-CM

## 2015-06-06 DIAGNOSIS — Z7901 Long term (current) use of anticoagulants: Secondary | ICD-10-CM | POA: Diagnosis not present

## 2015-06-06 DIAGNOSIS — I2582 Chronic total occlusion of coronary artery: Secondary | ICD-10-CM | POA: Diagnosis not present

## 2015-06-06 DIAGNOSIS — R600 Localized edema: Secondary | ICD-10-CM | POA: Diagnosis present

## 2015-06-06 DIAGNOSIS — N183 Chronic kidney disease, stage 3 unspecified: Secondary | ICD-10-CM | POA: Diagnosis present

## 2015-06-06 DIAGNOSIS — I129 Hypertensive chronic kidney disease with stage 1 through stage 4 chronic kidney disease, or unspecified chronic kidney disease: Secondary | ICD-10-CM | POA: Insufficient documentation

## 2015-06-06 DIAGNOSIS — R569 Unspecified convulsions: Secondary | ICD-10-CM | POA: Insufficient documentation

## 2015-06-06 DIAGNOSIS — Z6835 Body mass index (BMI) 35.0-35.9, adult: Secondary | ICD-10-CM | POA: Insufficient documentation

## 2015-06-06 DIAGNOSIS — E785 Hyperlipidemia, unspecified: Secondary | ICD-10-CM | POA: Diagnosis present

## 2015-06-06 DIAGNOSIS — E669 Obesity, unspecified: Secondary | ICD-10-CM | POA: Insufficient documentation

## 2015-06-06 DIAGNOSIS — I255 Ischemic cardiomyopathy: Secondary | ICD-10-CM | POA: Clinically undetermined

## 2015-06-06 DIAGNOSIS — R9439 Abnormal result of other cardiovascular function study: Secondary | ICD-10-CM | POA: Clinically undetermined

## 2015-06-06 DIAGNOSIS — K219 Gastro-esophageal reflux disease without esophagitis: Secondary | ICD-10-CM | POA: Diagnosis not present

## 2015-06-06 DIAGNOSIS — R0602 Shortness of breath: Secondary | ICD-10-CM | POA: Diagnosis not present

## 2015-06-06 DIAGNOSIS — I2699 Other pulmonary embolism without acute cor pulmonale: Secondary | ICD-10-CM

## 2015-06-06 DIAGNOSIS — N179 Acute kidney failure, unspecified: Secondary | ICD-10-CM | POA: Diagnosis not present

## 2015-06-06 DIAGNOSIS — R918 Other nonspecific abnormal finding of lung field: Secondary | ICD-10-CM

## 2015-06-06 DIAGNOSIS — I2510 Atherosclerotic heart disease of native coronary artery without angina pectoris: Secondary | ICD-10-CM | POA: Diagnosis not present

## 2015-06-06 DIAGNOSIS — R748 Abnormal levels of other serum enzymes: Secondary | ICD-10-CM | POA: Diagnosis not present

## 2015-06-06 DIAGNOSIS — J302 Other seasonal allergic rhinitis: Secondary | ICD-10-CM

## 2015-06-06 DIAGNOSIS — R059 Cough, unspecified: Secondary | ICD-10-CM

## 2015-06-06 DIAGNOSIS — J45909 Unspecified asthma, uncomplicated: Secondary | ICD-10-CM | POA: Insufficient documentation

## 2015-06-06 DIAGNOSIS — M797 Fibromyalgia: Secondary | ICD-10-CM | POA: Diagnosis not present

## 2015-06-06 DIAGNOSIS — N1832 Chronic kidney disease, stage 3b: Secondary | ICD-10-CM | POA: Diagnosis present

## 2015-06-06 DIAGNOSIS — Z79899 Other long term (current) drug therapy: Secondary | ICD-10-CM | POA: Insufficient documentation

## 2015-06-06 DIAGNOSIS — R7989 Other specified abnormal findings of blood chemistry: Secondary | ICD-10-CM | POA: Diagnosis present

## 2015-06-06 DIAGNOSIS — R1310 Dysphagia, unspecified: Secondary | ICD-10-CM | POA: Diagnosis not present

## 2015-06-06 DIAGNOSIS — R079 Chest pain, unspecified: Secondary | ICD-10-CM | POA: Diagnosis present

## 2015-06-06 DIAGNOSIS — Z86711 Personal history of pulmonary embolism: Secondary | ICD-10-CM | POA: Diagnosis present

## 2015-06-06 DIAGNOSIS — R778 Other specified abnormalities of plasma proteins: Secondary | ICD-10-CM | POA: Diagnosis present

## 2015-06-06 DIAGNOSIS — I10 Essential (primary) hypertension: Secondary | ICD-10-CM | POA: Diagnosis present

## 2015-06-06 LAB — BASIC METABOLIC PANEL
Anion gap: 13 (ref 5–15)
BUN: 21 mg/dL — ABNORMAL HIGH (ref 6–20)
CALCIUM: 9.8 mg/dL (ref 8.9–10.3)
CHLORIDE: 103 mmol/L (ref 101–111)
CO2: 26 mmol/L (ref 22–32)
CREATININE: 1.28 mg/dL — AB (ref 0.44–1.00)
GFR, EST AFRICAN AMERICAN: 47 mL/min — AB (ref >60)
GFR, EST NON AFRICAN AMERICAN: 40 mL/min — AB (ref >60)
Glucose, Bld: 110 mg/dL — ABNORMAL HIGH (ref 65–99)
Potassium: 5.1 mmol/L (ref 3.5–5.1)
SODIUM: 142 mmol/L (ref 135–145)

## 2015-06-06 LAB — CBC WITH DIFFERENTIAL/PLATELET
BASOS PCT: 0 %
Basophils Absolute: 0 10*3/uL (ref 0.0–0.1)
EOS ABS: 0.1 10*3/uL (ref 0.0–0.7)
Eosinophils Relative: 2 %
HCT: 43.5 % (ref 36.0–46.0)
HEMOGLOBIN: 13.7 g/dL (ref 12.0–15.0)
Lymphocytes Relative: 40 %
Lymphs Abs: 3.1 10*3/uL (ref 0.7–4.0)
MCH: 28 pg (ref 26.0–34.0)
MCHC: 31.5 g/dL (ref 30.0–36.0)
MCV: 88.8 fL (ref 78.0–100.0)
MONOS PCT: 7 %
Monocytes Absolute: 0.6 10*3/uL (ref 0.1–1.0)
NEUTROS PCT: 51 %
Neutro Abs: 3.9 10*3/uL (ref 1.7–7.7)
Platelets: 219 10*3/uL (ref 150–400)
RBC: 4.9 MIL/uL (ref 3.87–5.11)
RDW: 14.2 % (ref 11.5–15.5)
WBC: 7.6 10*3/uL (ref 4.0–10.5)

## 2015-06-06 LAB — TROPONIN I: Troponin I: 0.06 ng/mL — ABNORMAL HIGH (ref <0.031)

## 2015-06-06 LAB — RHEUMATOID FACTOR

## 2015-06-06 LAB — BRAIN NATRIURETIC PEPTIDE: Pro B Natriuretic peptide (BNP): 58 pg/mL (ref 0.0–100.0)

## 2015-06-06 MED ORDER — BENZONATATE 100 MG PO CAPS: 100.0000 mg | ORAL_CAPSULE | Freq: Three times a day (TID) | ORAL | Status: DC | PRN

## 2015-06-06 MED ORDER — ASPIRIN 81 MG PO CHEW
324.0000 mg | CHEWABLE_TABLET | Freq: Once | ORAL | Status: AC
  Administered 2015-06-07: 324 mg via ORAL
  Filled 2015-06-06: qty 4
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
## 2015-06-06 NOTE — Telephone Encounter (Signed)
Received call report for [facility] in the lab  Troponin 0.63 Will forward to Dr. Ashok Cordia and also page him to make him aware

## 2015-06-06 NOTE — Telephone Encounter (Signed)
PFT 09/03/11: FVC 1.85 L (78%) FEV1 1.58 L (87%) FEV1/FVC 0.86 FEF 25-75 2.39 L (121%)  IMAGING CXR P/ALAT 05/02/15 (personally reviewed by me): Mild hyperinflation with deep sulci bilaterally. No parenchymal nodule or opacity. No pleural effusion or thickening. Heart normal in size. Mediastinum normal in contour.  LABS 05/31/15 BMP: 139/4.4/104/28/18/1.13/89/9.6 LFT: 3.9/7.8/0.3/100/15/16
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
## 2015-06-06 NOTE — ED Notes (Signed)
Pt reports onset yesterday of right side chest pain and cough. Went to pcp and had blood work done. Was told to come here today due to abnormal labs, possible blood clot. Airway intact at triage, ekg done.

## 2015-06-06 NOTE — Telephone Encounter (Signed)
See phone note documented on 06/06/15
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
## 2015-06-06 NOTE — Patient Instructions (Addendum)
   We are going to do blood work to check for arthritis that could be affecting your lungs  I'm doing a swallowing test to see if there is any problem with your esophagus that could be contributing to your cough  I'm doing an ultrasound of your heart to see if the clot affected your heart or if he may have heart failure contributing to your cough  I'm also doing blood work for your heart to see if you have had any recent heart attack or strain  You can stop using the [facility]  I'm prescribing you Tessalon Perles to help with your cough in the meantime  We will do a special chest x-ray to see if you are having chronic clots that are going to your lungs  I'm repeating a CT scan of your chest to follow-up on the small nodules in your lungs seen on your CT scan in May 2016  Please call my office if you have any new breathing problems or questions before your next appointment  We will be doing a breathing test and walking test at her next appointment  I will see back in 3-4 weeks  TESTS ORDERED: 1. CT chest without contrast 2. V/Q scan 3. 6 minute walk test on room air at next appointment 4. Full pulmonary function testing at next appointment 5. Transthoracic echocardiogram 6. Barium swallow 7. Serum lab tests today

## 2015-06-07 ENCOUNTER — Observation Stay (HOSPITAL_COMMUNITY): Payer: Medicare Other

## 2015-06-07 ENCOUNTER — Encounter (HOSPITAL_COMMUNITY): Payer: Self-pay | Admitting: Internal Medicine

## 2015-06-07 DIAGNOSIS — R931 Abnormal findings on diagnostic imaging of heart and coronary circulation: Secondary | ICD-10-CM | POA: Diagnosis not present

## 2015-06-07 DIAGNOSIS — I10 Essential (primary) hypertension: Secondary | ICD-10-CM | POA: Diagnosis not present

## 2015-06-07 DIAGNOSIS — R0789 Other chest pain: Secondary | ICD-10-CM | POA: Diagnosis not present

## 2015-06-07 DIAGNOSIS — R778 Other specified abnormalities of plasma proteins: Secondary | ICD-10-CM | POA: Diagnosis present

## 2015-06-07 DIAGNOSIS — R7989 Other specified abnormal findings of blood chemistry: Secondary | ICD-10-CM | POA: Diagnosis not present

## 2015-06-07 DIAGNOSIS — N1832 Chronic kidney disease, stage 3b: Secondary | ICD-10-CM | POA: Diagnosis present

## 2015-06-07 DIAGNOSIS — N183 Chronic kidney disease, stage 3 unspecified: Secondary | ICD-10-CM | POA: Diagnosis present

## 2015-06-07 DIAGNOSIS — R9439 Abnormal result of other cardiovascular function study: Secondary | ICD-10-CM | POA: Clinically undetermined

## 2015-06-07 DIAGNOSIS — Z86711 Personal history of pulmonary embolism: Secondary | ICD-10-CM

## 2015-06-07 DIAGNOSIS — I255 Ischemic cardiomyopathy: Secondary | ICD-10-CM | POA: Clinically undetermined

## 2015-06-07 DIAGNOSIS — Z7901 Long term (current) use of anticoagulants: Secondary | ICD-10-CM

## 2015-06-07 DIAGNOSIS — R079 Chest pain, unspecified: Secondary | ICD-10-CM | POA: Diagnosis not present

## 2015-06-07 LAB — NM MYOCAR MULTI W/SPECT W/WALL MOTION / EF
CHL CUP MPHR: 147 {beats}/min
CHL CUP RESTING HR STRESS: 76 {beats}/min
CSEPEDS: 0 s
CSEPHR: 70 %
Estimated workload: 1 METS
Exercise duration (min): 0 min
Peak HR: 103 {beats}/min
RPE: 0

## 2015-06-07 LAB — PROTIME-INR
INR: 1.22 (ref 0.00–1.49)
Prothrombin Time: 15.5 seconds — ABNORMAL HIGH (ref 11.6–15.2)

## 2015-06-07 LAB — URINALYSIS, ROUTINE W REFLEX MICROSCOPIC
BILIRUBIN URINE: NEGATIVE
GLUCOSE, UA: NEGATIVE mg/dL
HGB URINE DIPSTICK: NEGATIVE
KETONES UR: NEGATIVE mg/dL
Leukocytes, UA: NEGATIVE
Nitrite: NEGATIVE
PROTEIN: NEGATIVE mg/dL
Specific Gravity, Urine: 1.02 (ref 1.005–1.030)
pH: 6 (ref 5.0–8.0)

## 2015-06-07 LAB — TROPONIN I
Troponin I: 0.05 ng/mL — ABNORMAL HIGH (ref <0.031)
Troponin I: 0.04 ng/mL — ABNORMAL HIGH (ref <0.031)
Troponin I: 0.04 ng/mL — ABNORMAL HIGH (ref <0.031)

## 2015-06-07 LAB — ANA COMPREHENSIVE PANEL
Centromere Ab Screen: <0.2 AI (ref 0.0–0.9)
DSDNA AB: 1 [IU]/mL (ref 0–9)
ENA SM Ab Ser-aCnc: <0.2 AI (ref 0.0–0.9)
ENA SSA (RO) Ab: <0.2 AI (ref 0.0–0.9)
ENA SSB (LA) Ab: <0.2 AI (ref 0.0–0.9)
Scleroderma SCL-70: <0.2 AI (ref 0.0–0.9)

## 2015-06-07 LAB — CYCLIC CITRUL PEPTIDE ANTIBODY, IGG: Cyclic Citrullin Peptide Ab: <16 Units

## 2015-06-07 LAB — INFLUENZA PANEL BY PCR (TYPE A & B)
H1N1 flu by pcr: NOT DETECTED
Influenza A By PCR: NEGATIVE
Influenza B By PCR: NEGATIVE

## 2015-06-07 LAB — APTT
APTT: 26 s (ref 24–37)
APTT: 33 s (ref 24–37)
APTT: 79 s — AB (ref 24–37)
aPTT: 98 seconds — ABNORMAL HIGH (ref 24–37)

## 2015-06-07 LAB — ANA W/REFLEX: ANA: NEGATIVE

## 2015-06-07 LAB — HEPARIN LEVEL (UNFRACTIONATED): HEPARIN UNFRACTIONATED: 0.89 [IU]/mL — AB (ref 0.30–0.70)

## 2015-06-07 LAB — PROCALCITONIN

## 2015-06-07 MED ORDER — LAMOTRIGINE ER 100 MG PO TB24: 200.0000 mg | ORAL_TABLET | Freq: Every day | ORAL | Status: DC

## 2015-06-07 MED ORDER — MORPHINE SULFATE (PF) 2 MG/ML IV SOLN: 2.0000 mg | INTRAVENOUS | Status: DC | PRN

## 2015-06-07 MED ORDER — PANTOPRAZOLE SODIUM 40 MG PO TBEC
40.0000 mg | DELAYED_RELEASE_TABLET | Freq: Every day | ORAL | Status: DC
  Administered 2015-06-07 – 2015-06-09 (×3): 40 mg via ORAL
  Filled 2015-06-07 (×3): qty 1

## 2015-06-07 MED ORDER — ONDANSETRON HCL 4 MG/2ML IJ SOLN
4.0000 mg | Freq: Four times a day (QID) | INTRAMUSCULAR | Status: DC | PRN
Start: 2015-06-07 — End: 2015-06-09

## 2015-06-07 MED ORDER — FUROSEMIDE 20 MG PO TABS: 20.0000 mg | ORAL_TABLET | Freq: Every day | ORAL | Status: DC

## 2015-06-07 MED ORDER — SODIUM CHLORIDE 0.9 % IV BOLUS (SEPSIS)
500.0000 mL | Freq: Once | INTRAVENOUS | Status: AC
  Administered 2015-06-07: 500 mL via INTRAVENOUS

## 2015-06-07 MED ORDER — BUTALBITAL-APAP-CAFFEINE 50-325-40 MG PO TABS: 1.0000 | ORAL_TABLET | Freq: Four times a day (QID) | ORAL | Status: DC | PRN

## 2015-06-07 MED ORDER — ASPIRIN EC 325 MG PO TBEC
325.0000 mg | DELAYED_RELEASE_TABLET | Freq: Every day | ORAL | Status: DC
  Administered 2015-06-07: 325 mg via ORAL
  Filled 2015-06-07: qty 1

## 2015-06-07 MED ORDER — ATORVASTATIN CALCIUM 10 MG PO TABS
10.0000 mg | ORAL_TABLET | Freq: Every day | ORAL | Status: DC
  Administered 2015-06-07: 10 mg via ORAL
  Filled 2015-06-07: qty 1

## 2015-06-07 MED ORDER — NORTRIPTYLINE HCL 25 MG PO CAPS
25.0000 mg | ORAL_CAPSULE | Freq: Every day | ORAL | Status: DC
  Administered 2015-06-07 – 2015-06-08 (×3): 25 mg via ORAL
  Filled 2015-06-07 (×4): qty 1

## 2015-06-07 MED ORDER — REGADENOSON 0.4 MG/5ML IV SOLN
0.4000 mg | Freq: Once | INTRAVENOUS | Status: DC
  Filled 2015-06-07: qty 5

## 2015-06-07 MED ORDER — LAMOTRIGINE 25 MG PO TABS
100.0000 mg | ORAL_TABLET | Freq: Two times a day (BID) | ORAL | Status: DC
  Administered 2015-06-07 – 2015-06-09 (×5): 100 mg via ORAL
  Filled 2015-06-07 (×5): qty 4

## 2015-06-07 MED ORDER — BENZONATATE 100 MG PO CAPS
100.0000 mg | ORAL_CAPSULE | Freq: Three times a day (TID) | ORAL | Status: DC | PRN
  Administered 2015-06-07 – 2015-06-08 (×2): 100 mg via ORAL
  Filled 2015-06-07 (×2): qty 1

## 2015-06-07 MED ORDER — REGADENOSON 0.4 MG/5ML IV SOLN
INTRAVENOUS | Status: AC
  Filled 2015-06-07: qty 5

## 2015-06-07 MED ORDER — SODIUM CHLORIDE 0.9 % IV SOLN
INTRAVENOUS | Status: DC
  Administered 2015-06-08: 07:00:00 via INTRAVENOUS

## 2015-06-07 MED ORDER — PROPRANOLOL HCL ER 120 MG PO CP24
120.0000 mg | ORAL_CAPSULE | Freq: Every day | ORAL | Status: DC
  Administered 2015-06-07 – 2015-06-09 (×3): 120 mg via ORAL
  Filled 2015-06-07 (×5): qty 1

## 2015-06-07 MED ORDER — SODIUM CHLORIDE 0.9 % IV SOLN: 250.0000 mL | INTRAVENOUS | Status: DC | PRN

## 2015-06-07 MED ORDER — SODIUM CHLORIDE 0.9% FLUSH: 3.0000 mL | INTRAVENOUS | Status: DC | PRN

## 2015-06-07 MED ORDER — REGADENOSON 0.4 MG/5ML IV SOLN
0.4000 mg | Freq: Once | INTRAVENOUS | Status: AC
  Administered 2015-06-07: 0.4 mg via INTRAVENOUS
  Filled 2015-06-07: qty 5

## 2015-06-07 MED ORDER — BUSPIRONE HCL 10 MG PO TABS
10.0000 mg | ORAL_TABLET | Freq: Three times a day (TID) | ORAL | Status: DC
  Administered 2015-06-07 – 2015-06-09 (×6): 10 mg via ORAL
  Filled 2015-06-07 (×6): qty 1

## 2015-06-07 MED ORDER — TECHNETIUM TC 99M SESTAMIBI GENERIC - CARDIOLITE
10.0000 | Freq: Once | INTRAVENOUS | Status: AC | PRN
  Administered 2015-06-07: 10 via INTRAVENOUS

## 2015-06-07 MED ORDER — HEPARIN (PORCINE) IN NACL 100-0.45 UNIT/ML-% IJ SOLN
1050.0000 [IU]/h | INTRAMUSCULAR | Status: DC
  Administered 2015-06-07 (×2): 1200 [IU]/h via INTRAVENOUS
  Filled 2015-06-07 (×2): qty 250

## 2015-06-07 MED ORDER — ACETAMINOPHEN 325 MG PO TABS: 650.0000 mg | ORAL_TABLET | ORAL | Status: DC | PRN

## 2015-06-07 MED ORDER — ZOLPIDEM TARTRATE 5 MG PO TABS
5.0000 mg | ORAL_TABLET | Freq: Every evening | ORAL | Status: DC | PRN
Start: 2015-06-07 — End: 2015-06-09

## 2015-06-07 MED ORDER — LOSARTAN POTASSIUM 50 MG PO TABS
100.0000 mg | ORAL_TABLET | Freq: Every day | ORAL | Status: DC
  Administered 2015-06-07 – 2015-06-09 (×3): 100 mg via ORAL
  Filled 2015-06-07 (×3): qty 2

## 2015-06-07 MED ORDER — TECHNETIUM TC 99M SESTAMIBI GENERIC - CARDIOLITE
30.0000 | Freq: Once | INTRAVENOUS | Status: AC | PRN
  Administered 2015-06-07: 30 via INTRAVENOUS

## 2015-06-07 MED ORDER — ALBUTEROL SULFATE (2.5 MG/3ML) 0.083% IN NEBU: 3.0000 mL | INHALATION_SOLUTION | RESPIRATORY_TRACT | Status: DC | PRN

## 2015-06-07 MED ORDER — MONTELUKAST SODIUM 10 MG PO TABS
10.0000 mg | ORAL_TABLET | Freq: Every day | ORAL | Status: DC
  Administered 2015-06-07 – 2015-06-08 (×2): 10 mg via ORAL
  Filled 2015-06-07 (×2): qty 1

## 2015-06-07 MED ORDER — SODIUM CHLORIDE 0.9% FLUSH: 3.0000 mL | Freq: Two times a day (BID) | INTRAVENOUS | Status: DC

## 2015-06-07 MED ORDER — LEVOFLOXACIN IN D5W 750 MG/150ML IV SOLN
750.0000 mg | INTRAVENOUS | Status: DC
  Administered 2015-06-07 – 2015-06-09 (×2): 750 mg via INTRAVENOUS
  Filled 2015-06-07 (×2): qty 150

## 2015-06-07 NOTE — Progress Notes (Addendum)
Patient seen and examined this morning  75 y/o female, seen by cardiology in the past with a history of HTN and dyslipidemia. Prior Nuclear stress scans in 2007 and 2011 negative. No history of MI or CAD. She was seen by Dr Ashok Cordia 06/06/15 and had complained of persistent cough as well as atypical chest pain. Troponins ere ordered and have come back abnormal 0.06-0.04. Consulted cardiology and patient ordered to have a Lexi scan Myoview today Chest x-ray suspicious for pneumonia, continue antibiotics Discontinue heparin drip if lexiscan Myoview negative  Anticipate discharge tomorrow

## 2015-06-07 NOTE — Progress Notes (Signed)
Pharmacy Antibiotic Note  [name] is a 75 y.o. female admitted on 06/06/2015 with Possible PNA.  Pharmacy has been consulted for Levaquin dosing.  CXR with some patchy infiltrates, WBC WNL  Plan: -Levaquin 750 mg IV q48h -Trend WBC, temp, renal function -Drug levels as indicated   Height: 5\' 3"  (160 cm) Weight: 199 lb 6.4 oz (90.447 kg) IBW/kg (Calculated) : 52.4  Temp (24hrs), Avg:98.1 F (36.7 C), Min:97.9 F (36.6 C), Max:98.5 F (36.9 C)   Recent Labs Lab 05/31/15 1145 06/06/15 2221  WBC  --  7.6  CREATININE 1.13 1.28*    Estimated Creatinine Clearance: 41.8 mL/min (by C-G formula based on Cr of 1.28).    Allergies  Allergen Reactions  . Pollen Extract Other (See Comments)    Seasonal allergies     Narda Bonds 06/07/2015 6:28 AM

## 2015-06-07 NOTE — Progress Notes (Addendum)
ANTICOAGULATION CONSULT NOTE - Initial Consult  Pharmacy Consult for Heparin Indication: chest pain/ACS  Allergies  Allergen Reactions  . Pollen Extract Other (See Comments)    Seasonal allergies    Patient Measurements: Height: 5\' 3"  (160 cm) Weight: 199 lb 6.4 oz (90.447 kg) IBW/kg (Calculated) : 52.4 Heparin Dosing Weight: 75 kg  Vital Signs: Temp: 97.9 F (36.6 C) (03/07 0812) Temp Source: Oral (03/07 0812) BP: 115/63 mmHg (03/07 1250) Pulse Rate: 79 (03/07 1250)  Labs:  Recent Labs  06/06/15 2221 06/07/15 0322 06/07/15 0323 06/07/15 0815 06/07/15 0933  HGB 13.7  --   --   --   --   HCT 43.5  --   --   --   --   PLT 219  --   --   --   --   APTT  --  26  --   --  33  LABPROT  --   --  15.5*  --   --   INR  --   --  1.22  --   --   HEPARINUNFRC  --   --  0.89*  --   --   CREATININE 1.28*  --   --   --   --   TROPONINI 0.06* 0.05*  --  0.04* 0.04*    Estimated Creatinine Clearance: 41.8 mL/min (by C-G formula based on Cr of 1.28).   Assessment: 75 y.o. female with cough/chest pain and elevated cardiac markers, h/o PE and Eliquis on hold, for heparin. aPTT = 33, was ordered for 1200, but lab drawn it at 0930 with other labs.   Goal of Therapy:  APTT 66-102 while Eliquis affecting anti-Xa level Heparin level 0.3-0.7 units/ml Monitor platelets by anticoagulation protocol: Yes   Plan:  Recheck aPTT at 1330, to allow 8 hrs after heparin initiation per protocol.  [name], PharmD, BCPS  Clinical Pharmacist  Pager: [phone]   06/07/2015,1:11 PM   Addendum: aPTT = 98, therapeutic.   Plan: Continue heparin 1200 units/hr Recheck confirmatory level at 2200 F/u after [facility]
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
## 2015-06-07 NOTE — ED Notes (Signed)
Attempted to call report x 1  

## 2015-06-07 NOTE — Progress Notes (Signed)
ANTICOAGULATION CONSULT NOTE - Follow Up Consult  Pharmacy Consult for Heparin Indication: chest pain/ACS  Allergies  Allergen Reactions  . Pollen Extract Other (See Comments)    Seasonal allergies    Patient Measurements: Height: 5\' 3"  (160 cm) Weight: 199 lb 6.4 oz (90.447 kg) IBW/kg (Calculated) : 52.4 Heparin Dosing Weight: 73 kg  Vital Signs: Temp: 98.6 F (37 C) (03/07 2013) Temp Source: Oral (03/07 2013) BP: 131/75 mmHg (03/07 2013) Pulse Rate: 85 (03/07 2013)  Labs:  Recent Labs  06/06/15 2221  06/07/15 0322 06/07/15 0323 06/07/15 0815 06/07/15 0933 06/07/15 1437 06/07/15 2150  HGB 13.7  --   --   --   --   --   --   --   HCT 43.5  --   --   --   --   --   --   --   PLT 219  --   --   --   --   --   --   --   APTT  --   < > 26  --   --  33 98* 79*  LABPROT  --   --   --  15.5*  --   --   --   --   INR  --   --   --  1.22  --   --   --   --   HEPARINUNFRC  --   --   --  0.89*  --   --   --   --   CREATININE 1.28*  --   --   --   --   --   --   --   TROPONINI 0.06*  --  0.05*  --  0.04* 0.04*  --   --   < > = values in this interval not displayed.  Estimated Creatinine Clearance: 41.8 mL/min (by C-G formula based on Cr of 1.28).   Medications:  Heparin @ 1200 units/hr (12 ml/hr)  Assessment: 74 YOF on apixaban PTA for history of PE - held on admit in anticipation that cardiac procedures may be needed - and transitioned to heparin.  A aPTT level this evening remains therapeutic (aPTT 79 << 98, goal of 66-102 seconds). No bleeding or issues with the drip noted at this time.   Goal of Therapy:  Heparin level 0.3-0.7 units/ml aPTT 66-102 seconds Monitor platelets by anticoagulation protocol: Yes   Plan:  1. Continue heparin at 1200 units/hr (12 ml/hr) 2. Will continue to monitor for any signs/symptoms of bleeding and will follow up with aPTT and heparin level in the a.m.   Alycia Rossetti, PharmD, BCPS Clinical Pharmacist Pager:  [phone] 06/07/2015 10:28 PM

## 2015-06-07 NOTE — Progress Notes (Signed)
nuc study completed without complications.  Nuc results to follow.   
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
## 2015-06-07 NOTE — Progress Notes (Signed)
Pt arrived to floor in NAD, no pain, pt A/Ox4, steady on feet. Pt refused bed alarm, VSS. Will continue to monitor.

## 2015-06-08 ENCOUNTER — Encounter (HOSPITAL_COMMUNITY)
Admission: EM | Disposition: A | Discharge status: Home / Self Care | Payer: Self-pay | Source: Home / Self Care | Attending: Emergency Medicine

## 2015-06-08 ENCOUNTER — Encounter (HOSPITAL_COMMUNITY): Payer: Self-pay | Admitting: Interventional Cardiology

## 2015-06-08 ENCOUNTER — Ambulatory Visit (HOSPITAL_BASED_OUTPATIENT_CLINIC_OR_DEPARTMENT_OTHER): Payer: Medicare Other

## 2015-06-08 DIAGNOSIS — R079 Chest pain, unspecified: Secondary | ICD-10-CM

## 2015-06-08 DIAGNOSIS — I2510 Atherosclerotic heart disease of native coronary artery without angina pectoris: Secondary | ICD-10-CM | POA: Diagnosis not present

## 2015-06-08 DIAGNOSIS — R7989 Other specified abnormal findings of blood chemistry: Secondary | ICD-10-CM | POA: Diagnosis not present

## 2015-06-08 DIAGNOSIS — R931 Abnormal findings on diagnostic imaging of heart and coronary circulation: Secondary | ICD-10-CM | POA: Diagnosis not present

## 2015-06-08 DIAGNOSIS — R072 Precordial pain: Secondary | ICD-10-CM | POA: Diagnosis not present

## 2015-06-08 DIAGNOSIS — I255 Ischemic cardiomyopathy: Secondary | ICD-10-CM | POA: Diagnosis not present

## 2015-06-08 DIAGNOSIS — E785 Hyperlipidemia, unspecified: Secondary | ICD-10-CM

## 2015-06-08 HISTORY — PX: CARDIAC CATHETERIZATION: SHX172

## 2015-06-08 LAB — ECHOCARDIOGRAM COMPLETE
E decel time: 222 msec
FS: 27 % — AB (ref 28–44)
Height: 63 in
IVS/LV PW RATIO, ED: 1.07
IVS: 0.1 cm (ref 0.6–1.1)
LA diam end sys: 3.1 cm
LVIDD: 3.8 cm (ref 3.5–6.0)
LVIDS: 2.8 cm (ref 2.1–4.0)
LVOT area: 3.14 cm2
LVOTD: 2 cm
MV Peak grad: 3 mmHg
MV pk E vel: 88.3 m/s
MVPKAVEL: 119 m/s
PW: 1.2 mm — AB (ref 0.6–1.1)
WEIGHTICAEL: 3204.8 [oz_av]

## 2015-06-08 LAB — BASIC METABOLIC PANEL
ANION GAP: 5 (ref 5–15)
BUN: 16 mg/dL (ref 6–20)
CALCIUM: 8.5 mg/dL — AB (ref 8.9–10.3)
CO2: 25 mmol/L (ref 22–32)
Chloride: 110 mmol/L (ref 101–111)
Creatinine, Ser: 1.18 mg/dL — ABNORMAL HIGH (ref 0.44–1.00)
GFR calc Af Amer: 52 mL/min — ABNORMAL LOW (ref >60)
GFR calc non Af Amer: 45 mL/min — ABNORMAL LOW (ref >60)
GLUCOSE: 116 mg/dL — AB (ref 65–99)
Potassium: 4.2 mmol/L (ref 3.5–5.1)
Sodium: 140 mmol/L (ref 135–145)

## 2015-06-08 LAB — POCT I-STAT 3, ART BLOOD GAS (G3+)
Acid-base deficit: 1 mmol/L (ref 0.0–2.0)
Bicarbonate: 25.3 mEq/L — ABNORMAL HIGH (ref 20.0–24.0)
O2 Saturation: 97 %
PCO2 ART: 46.1 mmHg — AB (ref 35.0–45.0)
PH ART: 7.347 — AB (ref 7.350–7.450)
PO2 ART: 97 mmHg (ref 80.0–100.0)
TCO2: 27 mmol/L (ref 0–100)

## 2015-06-08 LAB — COMPREHENSIVE METABOLIC PANEL
ALBUMIN: 2.6 g/dL — AB (ref 3.5–5.0)
ALK PHOS: 92 U/L (ref 38–126)
ALT: 16 U/L (ref 14–54)
AST: 18 U/L (ref 15–41)
Anion gap: 10 (ref 5–15)
BILIRUBIN TOTAL: 0.3 mg/dL (ref 0.3–1.2)
BUN: 17 mg/dL (ref 6–20)
CO2: 25 mmol/L (ref 22–32)
Calcium: 8.6 mg/dL — ABNORMAL LOW (ref 8.9–10.3)
Chloride: 107 mmol/L (ref 101–111)
Creatinine, Ser: 1.17 mg/dL — ABNORMAL HIGH (ref 0.44–1.00)
GFR calc Af Amer: 52 mL/min — ABNORMAL LOW (ref >60)
GFR, EST NON AFRICAN AMERICAN: 45 mL/min — AB (ref >60)
GLUCOSE: 111 mg/dL — AB (ref 65–99)
POTASSIUM: 4.2 mmol/L (ref 3.5–5.1)
Sodium: 142 mmol/L (ref 135–145)
TOTAL PROTEIN: 5.7 g/dL — AB (ref 6.5–8.1)

## 2015-06-08 LAB — POCT I-STAT 3, VENOUS BLOOD GAS (G3P V)
ACID-BASE DEFICIT: 1 mmol/L (ref 0.0–2.0)
Bicarbonate: 24.8 mEq/L — ABNORMAL HIGH (ref 20.0–24.0)
O2 Saturation: 73 %
PH VEN: 7.329 — AB (ref 7.250–7.300)
TCO2: 26 mmol/L (ref 0–100)
pCO2, Ven: 47.1 mmHg (ref 45.0–50.0)
pO2, Ven: 42 mmHg (ref 31.0–45.0)

## 2015-06-08 LAB — CBC
HEMATOCRIT: 37.7 % (ref 36.0–46.0)
Hemoglobin: 11.8 g/dL — ABNORMAL LOW (ref 12.0–15.0)
MCH: 27.6 pg (ref 26.0–34.0)
MCHC: 31.3 g/dL (ref 30.0–36.0)
MCV: 88.1 fL (ref 78.0–100.0)
Platelets: 183 10*3/uL (ref 150–400)
RBC: 4.28 MIL/uL (ref 3.87–5.11)
RDW: 14.5 % (ref 11.5–15.5)
WBC: 6.8 10*3/uL (ref 4.0–10.5)

## 2015-06-08 LAB — PROTIME-INR
INR: 1.2 (ref 0.00–1.49)
PROTHROMBIN TIME: 15.3 s — AB (ref 11.6–15.2)

## 2015-06-08 LAB — POCT ACTIVATED CLOTTING TIME
ACTIVATED CLOTTING TIME: 255 s
ACTIVATED CLOTTING TIME: 204 s

## 2015-06-08 LAB — APTT: APTT: 116 s — AB (ref 24–37)

## 2015-06-08 LAB — HEPARIN LEVEL (UNFRACTIONATED): HEPARIN UNFRACTIONATED: 1.2 [IU]/mL — AB (ref 0.30–0.70)

## 2015-06-08 SURGERY — LEFT HEART CATH AND CORONARY ANGIOGRAPHY
Anesthesia: LOCAL

## 2015-06-08 MED ORDER — ACETAMINOPHEN 325 MG PO TABS: 650.0000 mg | ORAL_TABLET | ORAL | Status: DC | PRN

## 2015-06-08 MED ORDER — VERAPAMIL HCL 2.5 MG/ML IV SOLN
INTRAVENOUS | Status: AC
  Filled 2015-06-08: qty 2

## 2015-06-08 MED ORDER — ONDANSETRON HCL 4 MG/2ML IJ SOLN: 4.0000 mg | Freq: Four times a day (QID) | INTRAMUSCULAR | Status: DC | PRN

## 2015-06-08 MED ORDER — NITROGLYCERIN 0.4 MG SL SUBL
0.4000 mg | SUBLINGUAL_TABLET | SUBLINGUAL | Status: DC | PRN
  Filled 2015-06-08: qty 25

## 2015-06-08 MED ORDER — HEPARIN (PORCINE) IN NACL 2-0.9 UNIT/ML-% IJ SOLN
INTRAMUSCULAR | Status: AC
  Filled 2015-06-08: qty 1000

## 2015-06-08 MED ORDER — ATORVASTATIN CALCIUM 40 MG PO TABS
40.0000 mg | ORAL_TABLET | Freq: Every day | ORAL | Status: DC
  Administered 2015-06-08 – 2015-06-09 (×2): 40 mg via ORAL
  Filled 2015-06-08: qty 1

## 2015-06-08 MED ORDER — HEPARIN SODIUM (PORCINE) 1000 UNIT/ML IJ SOLN
INTRAMUSCULAR | Status: AC
  Filled 2015-06-08: qty 1

## 2015-06-08 MED ORDER — LIDOCAINE HCL (PF) 1 % IJ SOLN
INTRAMUSCULAR | Status: AC
  Filled 2015-06-08: qty 30

## 2015-06-08 MED ORDER — SODIUM CHLORIDE 0.9% FLUSH: 3.0000 mL | INTRAVENOUS | Status: DC | PRN

## 2015-06-08 MED ORDER — NITROGLYCERIN 1 MG/10 ML FOR IR/CATH LAB
INTRA_ARTERIAL | Status: AC
  Filled 2015-06-08: qty 10

## 2015-06-08 MED ORDER — IOHEXOL 350 MG/ML SOLN
INTRAVENOUS | Status: DC | PRN
  Administered 2015-06-08: 85 mL via INTRA_ARTERIAL

## 2015-06-08 MED ORDER — FENTANYL CITRATE (PF) 100 MCG/2ML IJ SOLN
INTRAMUSCULAR | Status: AC
  Filled 2015-06-08: qty 2

## 2015-06-08 MED ORDER — OXYCODONE-ACETAMINOPHEN 5-325 MG PO TABS: 1.0000 | ORAL_TABLET | ORAL | Status: DC | PRN

## 2015-06-08 MED ORDER — FUROSEMIDE 40 MG PO TABS
40.0000 mg | ORAL_TABLET | Freq: Every day | ORAL | Status: DC
  Administered 2015-06-08 – 2015-06-09 (×2): 40 mg via ORAL
  Filled 2015-06-08 (×2): qty 1

## 2015-06-08 MED ORDER — APIXABAN 5 MG PO TABS
5.0000 mg | ORAL_TABLET | Freq: Two times a day (BID) | ORAL | Status: DC
  Administered 2015-06-08 – 2015-06-09 (×2): 5 mg via ORAL
  Filled 2015-06-08 (×2): qty 1

## 2015-06-08 MED ORDER — FENTANYL CITRATE (PF) 100 MCG/2ML IJ SOLN
INTRAMUSCULAR | Status: DC | PRN
  Administered 2015-06-08 (×2): 50 ug via INTRAVENOUS

## 2015-06-08 MED ORDER — MIDAZOLAM HCL 2 MG/2ML IJ SOLN
INTRAMUSCULAR | Status: DC | PRN
  Administered 2015-06-08 (×2): 1 mg via INTRAVENOUS

## 2015-06-08 MED ORDER — SODIUM CHLORIDE 0.9 % IV SOLN: 250.0000 mL | INTRAVENOUS | Status: DC | PRN

## 2015-06-08 MED ORDER — SODIUM CHLORIDE 0.9 % WEIGHT BASED INFUSION: 1.0000 mL/kg/h | INTRAVENOUS | Status: AC

## 2015-06-08 MED ORDER — SODIUM CHLORIDE 0.9% FLUSH
3.0000 mL | Freq: Two times a day (BID) | INTRAVENOUS | Status: DC
Start: 2015-06-08 — End: 2015-06-09
  Administered 2015-06-09: 3 mL via INTRAVENOUS

## 2015-06-08 MED ORDER — HEPARIN (PORCINE) IN NACL 2-0.9 UNIT/ML-% IJ SOLN
INTRAMUSCULAR | Status: DC | PRN
  Administered 2015-06-08: 13:00:00

## 2015-06-08 MED ORDER — ATORVASTATIN CALCIUM 40 MG PO TABS
40.0000 mg | ORAL_TABLET | Freq: Every day | ORAL | Status: DC
  Filled 2015-06-08: qty 1

## 2015-06-08 MED ORDER — VERAPAMIL HCL 2.5 MG/ML IV SOLN
INTRAVENOUS | Status: DC | PRN
  Administered 2015-06-08: 12:00:00 via INTRA_ARTERIAL

## 2015-06-08 MED ORDER — ASPIRIN EC 81 MG PO TBEC
81.0000 mg | DELAYED_RELEASE_TABLET | Freq: Every day | ORAL | Status: DC
  Administered 2015-06-09: 81 mg via ORAL
  Filled 2015-06-08: qty 1

## 2015-06-08 MED ORDER — HEPARIN SODIUM (PORCINE) 1000 UNIT/ML IJ SOLN
INTRAMUSCULAR | Status: DC | PRN
  Administered 2015-06-08: 4500 [IU] via INTRAVENOUS

## 2015-06-08 MED ORDER — MIDAZOLAM HCL 2 MG/2ML IJ SOLN
INTRAMUSCULAR | Status: AC
  Filled 2015-06-08: qty 2

## 2015-06-08 MED ORDER — ENOXAPARIN SODIUM 40 MG/0.4ML ~~LOC~~ SOLN: 40.0000 mg | SUBCUTANEOUS | Status: DC

## 2015-06-08 MED ORDER — LIDOCAINE HCL (PF) 1 % IJ SOLN
INTRAMUSCULAR | Status: DC | PRN
  Administered 2015-06-08: 4 mL via INTRADERMAL
  Administered 2015-06-08: 5 mL via INTRADERMAL

## 2015-06-08 SURGICAL SUPPLY — 12 items
CATH BALLN WEDGE 5F 110CM (CATHETERS) ×1 IMPLANT
CATH INFINITI 5 FR JL3.5 (CATHETERS) ×1 IMPLANT
CATH INFINITI JR4 5F (CATHETERS) ×1 IMPLANT
DEVICE RAD COMP TR BAND LRG (VASCULAR PRODUCTS) ×2 IMPLANT
GLIDESHEATH SLEND A-KIT 6F 22G (SHEATH) ×1 IMPLANT
KIT HEART LEFT (KITS) ×2 IMPLANT
PACK CARDIAC CATHETERIZATION (CUSTOM PROCEDURE TRAY) ×2 IMPLANT
SHEATH FAST CATH BRACH 5F 5CM (SHEATH) ×1 IMPLANT
TRANSDUCER W/STOPCOCK (MISCELLANEOUS) ×2 IMPLANT
TUBING CIL FLEX 10 FLL-RA (TUBING) ×2 IMPLANT
WIRE HI TORQ VERSACORE-J 145CM (WIRE) ×1 IMPLANT
WIRE SAFE-T 1.5MM-J .035X260CM (WIRE) ×1 IMPLANT

## 2015-06-08 NOTE — Progress Notes (Signed)
[facility] PROGRESS NOTE  [name] [id number] DOB: 12/26/1940 DOA: 06/06/2015 PCP: Binnie Rail, MD  Length of stay:    Assessment/Plan: Principal Problem:   Elevated troponin Active Problems:   Dyslipidemia   Fibromyalgia   HTN (hypertension)   Obese   History of pulmonary embolus (PE)-May 2016   Chest pain   Chronic anticoagulation-Eliquis   CRI stage 3, GFR 30-59 mL/min   Abnormal nuclear stress test   Cardiomyopathy, ischemic - suggested by Myoview    Brief summary 75 y/o female, seen by cardiology in the past with a history of HTN and dyslipidemia. Prior Nuclear stress scans in 2007 and 2011 negative. No history of MI or CAD. She was seen by Dr Ashok Cordia 06/06/15 and had complained of persistent cough as well as atypical chest pain. Troponins ere ordered and have come back abnormal 0.06-0.04. The pt complains of localized Rt sided chest pain. She has had this for > month. It is not exertional, no convincing assocaited symptoms. Myoview was ordered on admission.   Assessment and plan  1. Chest pain with elevated troponin - chest pain appears atypical but patient's troponin is elevated, Myoview shows global hypokinesia, EF of 25%. Patient scheduled for right and left heart cath today. Patient is on Apixaban and since anticipation of possible procedure we will keep patient on heparin. 2-D echo pending.   2. Acute renal failure - patient's creatinine is mildly elevated.  Patient receiving IV fluids for cardiac cath. Currently on Cozaar, may also need Lasix prior to discharge, monitor renal function closely 3. History of PE on Apixaban - in anticipation of possible cardiac procedures I have placed patient on heparin and will hold Apixaban for now. 4. Hypertension - see #2. 5. History of seizure on Lamictal. 6. History of chronic cough with possible cough variant asthma. 7. GERD. PPI  DVT prophylaxsis heparin drip  Code Status:      Code Status Orders         Start     Ordered   06/07/15 0224  Full code   Continuous     06/07/15 0224       Family Communication: Discussed in detail with the patient, all imaging results, lab results explained to the patient   Disposition Plan: Pending cardiology recommendations    Consultants:  Cardiology  Procedures:  None  Antibiotics: Anti-infectives    Start     Dose/Rate Route Frequency Ordered Stop   06/07/15 0645  levofloxacin (LEVAQUIN) IVPB 750 mg     750 mg 100 mL/hr over 90 Minutes Intravenous Every 48 hours 06/07/15 0630           HPI/Subjective: Denies any chest pain, shortness of breath, continues to have cough  Objective: Filed Vitals:   06/07/15 1408 06/07/15 2013 06/08/15 0019 06/08/15 0518  BP: 150/75 131/75 110/58 138/67  Pulse: 85 85 87 77  Temp: 97.9 F (36.6 C) 98.6 F (37 C) 98.5 F (36.9 C) 98.1 F (36.7 C)  TempSrc: Oral Oral Oral Oral  Resp: 17 18 18 18   Height:      Weight:    90.855 kg (200 lb 4.8 oz)  SpO2: 100% 99% 93% 98%    Intake/Output Summary (Last 24 hours) at 06/08/15 0848 Last data filed at 06/08/15 0648  Gross per 24 hour  Intake  915.6 ml  Output      0 ml  Net  915.6 ml    Exam:  General: No  acute respiratory distress Lungs: Clear to auscultation bilaterally without wheezes or crackles Cardiovascular: Regular rate and rhythm without murmur gallop or rub normal S1 and S2 Abdomen: Nontender, nondistended, soft, bowel sounds positive, no rebound, no ascites, no appreciable mass Extremities: No significant cyanosis, clubbing, or edema bilateral lower extremities     Data Review   Micro Results No results found for this or any previous visit (from the past 240 hour(s)).  Radiology Reports Dg Chest 2 View  06/06/2015  CLINICAL DATA:  Cough and right-sided chest pain for 2 days. EXAM: CHEST  2 VIEW COMPARISON:  05/02/2015 FINDINGS: Normal heart size and pulmonary vascularity. Emphysematous changes in the lungs. Suggestion  of vague patchy infiltration in the lung bases possibly representing early pneumonia. Scarring in the right mid lung. No pleural effusions. No pneumothorax. IMPRESSION: Emphysematous changes in the lungs with suggestion of patchy infiltration in the lung bases. Electronically Signed   By: Lucienne Capers M.D.   On: 06/06/2015 22:45   Nm Myocar Multi W/spect W/wall Motion / Ef  06/07/2015  CLINICAL DATA:  Chest pain EXAM: MYOCARDIAL IMAGING WITH SPECT (REST AND PHARMACOLOGIC-STRESS) GATED LEFT VENTRICULAR WALL MOTION STUDY LEFT VENTRICULAR EJECTION FRACTION TECHNIQUE: Standard myocardial SPECT imaging was performed after resting intravenous injection of 10 mCi Tc-23m sestamibi. Subsequently, intravenous infusion of Lexiscan was performed under the supervision of the Cardiology staff. At peak effect of the drug, 30 mCi Tc-35m sestamibi was injected intravenously and standard myocardial SPECT imaging was performed. Quantitative gated imaging was also performed to evaluate left ventricular wall motion, and estimate left ventricular ejection fraction. COMPARISON:  None. FINDINGS: Perfusion: There is a large amount of gastrointestinal and hepatic activity. This severely limits perfusion defect determination. There is an apparent defect at the inferior apex on stress images which re- perfuses on rest imaging. This may be artifactual. There is also diminished diffuse and profusion within the inferior wall. Again, this may be a true perfusion defect or artifactual. Wall Motion: Severe global hypokinesis is noted. Left Ventricular Ejection Fraction: 25 % End diastolic volume 56 ml End systolic volume 42 ml IMPRESSION: 1. The study was limited. There is diminished perfusion in the inferior wall an a possible stress-induced perfusion defect at the inferior apex as described above. These findings may be artifactual. 2. Severe global hypokinesis. 3. Left ventricular ejection fraction 25% 4. High-risk stress test findings*.  *2012 Appropriate Use Criteria for Coronary Revascularization Focused Update: J Am Coll Cardiol. [id number]. http://content.[email or web address].aspx?articleid=[email or web address] Electronically Signed   By: Marybelle Killings M.D.   On: 06/07/2015 16:41     CBC  Recent Labs Lab 06/06/15 2221 06/08/15 0430  WBC 7.6 6.8  HGB 13.7 11.8*  HCT 43.5 37.7  PLT 219 183  MCV 88.8 88.1  MCH 28.0 27.6  MCHC 31.5 31.3  RDW 14.2 14.5  LYMPHSABS 3.1  --   MONOABS 0.6  --   EOSABS 0.1  --   BASOSABS 0.0  --     Chemistries   Recent Labs Lab 06/06/15 2221 06/08/15 0417 06/08/15 0430  NA 142 140 142  K 5.1 4.2 4.2  CL 103 110 107  CO2 26 25 25   GLUCOSE 110* 116* 111*  BUN 21* 16 17  CREATININE 1.28* 1.18* 1.17*  CALCIUM 9.8 8.5* 8.6*  AST  --   --  18  ALT  --   --  16  ALKPHOS  --   --  92  BILITOT  --   --  0.3   ------------------------------------------------------------------------------------------------------------------  estimated creatinine clearance is 45.8 mL/min (by C-G formula based on Cr of 1.17). ------------------------------------------------------------------------------------------------------------------ No results for input(s): HGBA1C in the last 72 hours. ------------------------------------------------------------------------------------------------------------------ No results for input(s): CHOL, HDL, LDLCALC, TRIG, CHOLHDL, LDLDIRECT in the last 72 hours. ------------------------------------------------------------------------------------------------------------------ No results for input(s): TSH, T4TOTAL, T3FREE, THYROIDAB in the last 72 hours.  Invalid input(s): FREET3 ------------------------------------------------------------------------------------------------------------------ No results for input(s): VITAMINB12, FOLATE, FERRITIN, TIBC, IRON, RETICCTPCT in the last 72 hours.  Coagulation profile  Recent Labs Lab 06/07/15 0323 06/08/15 0417  INR  1.22 1.20    No results for input(s): DDIMER in the last 72 hours.  Cardiac Enzymes  Recent Labs Lab 06/07/15 0322 06/07/15 0815 06/07/15 0933  TROPONINI 0.05* 0.04* 0.04*   ------------------------------------------------------------------------------------------------------------------ Invalid input(s): POCBNP   CBG: No results for input(s): GLUCAP in the last 168 hours.     Studies: Dg Chest 2 View  06/06/2015  CLINICAL DATA:  Cough and right-sided chest pain for 2 days. EXAM: CHEST  2 VIEW COMPARISON:  05/02/2015 FINDINGS: Normal heart size and pulmonary vascularity. Emphysematous changes in the lungs. Suggestion of vague patchy infiltration in the lung bases possibly representing early pneumonia. Scarring in the right mid lung. No pleural effusions. No pneumothorax. IMPRESSION: Emphysematous changes in the lungs with suggestion of patchy infiltration in the lung bases. Electronically Signed   By: Lucienne Capers M.D.   On: 06/06/2015 22:45   Nm Myocar Multi W/spect W/wall Motion / Ef  06/07/2015  CLINICAL DATA:  Chest pain EXAM: MYOCARDIAL IMAGING WITH SPECT (REST AND PHARMACOLOGIC-STRESS) GATED LEFT VENTRICULAR WALL MOTION STUDY LEFT VENTRICULAR EJECTION FRACTION TECHNIQUE: Standard myocardial SPECT imaging was performed after resting intravenous injection of 10 mCi Tc-8m sestamibi. Subsequently, intravenous infusion of Lexiscan was performed under the supervision of the Cardiology staff. At peak effect of the drug, 30 mCi Tc-70m sestamibi was injected intravenously and standard myocardial SPECT imaging was performed. Quantitative gated imaging was also performed to evaluate left ventricular wall motion, and estimate left ventricular ejection fraction. COMPARISON:  None. FINDINGS: Perfusion: There is a large amount of gastrointestinal and hepatic activity. This severely limits perfusion defect determination. There is an apparent defect at the inferior apex on stress images which  re- perfuses on rest imaging. This may be artifactual. There is also diminished diffuse and profusion within the inferior wall. Again, this may be a true perfusion defect or artifactual. Wall Motion: Severe global hypokinesis is noted. Left Ventricular Ejection Fraction: 25 % End diastolic volume 56 ml End systolic volume 42 ml IMPRESSION: 1. The study was limited. There is diminished perfusion in the inferior wall an a possible stress-induced perfusion defect at the inferior apex as described above. These findings may be artifactual. 2. Severe global hypokinesis. 3. Left ventricular ejection fraction 25% 4. High-risk stress test findings*. *2012 Appropriate Use Criteria for Coronary Revascularization Focused Update: J Am Coll Cardiol. B5713794. http://content.[email or web address].aspx?articleid=[email or web address] Electronically Signed   By: Marybelle Killings M.D.   On: 06/07/2015 16:41      Lab Results  Component Value Date   HGBA1C 6.2 05/31/2015   Lab Results  Component Value Date   LDLCALC 133* 02/09/2015   CREATININE 1.17* 06/08/2015       Scheduled Meds: . aspirin EC  325 mg Oral Daily  . atorvastatin  10 mg Oral Daily  . busPIRone  10 mg Oral TID  . lamoTRIgine  100 mg Oral BID  . levofloxacin (LEVAQUIN) IV  750 mg Intravenous Q48H  . losartan  100 mg Oral Daily  . montelukast  10 mg Oral QHS  . nortriptyline  25 mg Oral QHS  . pantoprazole  40 mg Oral Daily  . propranolol ER  120 mg Oral Daily  . regadenoson  0.4 mg Intravenous Once  . sodium chloride flush  3 mL Intravenous Q12H   Continuous Infusions: . sodium chloride 75 mL/hr at 06/08/15 0645  . heparin 1,050 Units/hr (06/08/15 0646)    Principal Problem:   Elevated troponin Active Problems:   Dyslipidemia   Fibromyalgia   HTN (hypertension)   Obese   History of pulmonary embolus (PE)-May 2016   Chest pain   Chronic anticoagulation-Eliquis   CRI stage 3, GFR 30-59 mL/min   Abnormal nuclear stress test    Cardiomyopathy, ischemic - suggested by Myoview    Time spent: 45 minutes   [facility] Pager [phone]. If 7PM-7AM, please contact night-coverage at [email or web address], password [facility] 06/08/2015, 8:48 AM

## 2015-06-08 NOTE — Assessment & Plan Note (Addendum)
Good L-R collaterals, but evidence of ischemia on Myoview.   She is not noting anginal pain - but this could explain mild troponin elevation with watershed ischemia.  Will keep on ASA - but can reduce to 81 mg. No Plavix as she is on Eliquis -- is on statin - would increase to 40mg  atorvastatin Will have her see Dr. Beau Fanny or [place] (CTO team) to review anatomy & symptoms to determine if she will benefit from CTO revascularization

## 2015-06-08 NOTE — Assessment & Plan Note (Signed)
More aggressive goal with documented CAD -- increase Lipitor to 40mg .

## 2015-06-08 NOTE — Discharge Instructions (Signed)
Angina Pectoris  Angina pectoris, often called angina, is extreme discomfort in the chest, neck, or arm. This is caused by a lack of blood in the middle and thickest layer of the heart wall (myocardium). There are four types of angina:  · Stable angina. Stable angina usually occurs in episodes of predictable frequency and duration. It is usually brought on by physical activity, stress, or excitement. Stable angina usually lasts a few minutes and can often be relieved by a medicine that you place under your tongue. This medicine is called sublingual nitroglycerin.  · Unstable angina. Unstable angina can occur even when you are doing little or no physical activity. It can even occur while you are sleeping or when you are at rest. It can suddenly increase in severity or frequency. It may not be relieved by sublingual nitroglycerin, and it can last up to 30 minutes.  · Microvascular angina. This type of angina is caused by a disorder of tiny blood vessels called arterioles. Microvascular angina is more common in women. The pain may be more severe and last longer than other types of angina pectoris.  · Prinzmetal or variant angina. This type of angina pectoris is rare and usually occurs when you are doing little or no physical activity. It especially occurs in the early morning hours.  CAUSES  Atherosclerosis is the cause of angina. This is the buildup of fat and cholesterol (plaque) on the inside of the arteries. Over time, the plaque may narrow or block the artery, and this will lessen blood flow to the heart. Plaque can also become weak and break off within a coronary artery to form a clot and cause a sudden blockage.  RISK FACTORS  Risk factors common to both men and women include:  · High cholesterol levels.  · High blood pressure (hypertension).  · Tobacco use.  · Diabetes.  · Family history of angina.  · Obesity.  · Lack of exercise.  · A diet high in saturated fats.  Women are at greater risk for angina if they  are:  · Over age 55.  · Postmenopausal.  SYMPTOMS  Many people do not experience any symptoms during the early stages of angina. As the condition progresses, symptoms common to both men and women may include:  · Chest pain.    The pain can be described as a crushing or squeezing in the chest, or a tightness, pressure, fullness, or heaviness in the chest.    The pain can last more than a few minutes, or it can stop and recur.  · Pain in the arms, neck, jaw, or back.  · Unexplained heartburn or indigestion.  · Shortness of breath.  · Nausea.  · Sudden cold sweats.  · Sudden light-headedness.  Many women have chest discomfort and some of the other symptoms. However, women often have different (atypical) symptoms, such as:   · Fatigue.  · Unexplained feelings of nervousness or anxiety.  · Unexplained weakness.  · Dizziness or fainting.  Sometimes, women may have angina without any symptoms.  DIAGNOSIS   Tests to diagnose angina may include:  · ECG (electrocardiogram).  · Exercise stress test. This looks for signs of blockage when the heart is being exercised.  · Pharmacologic stress test. This test looks for signs of blockage when the heart is being stressed with a medicine.  · Blood tests.  · Coronary angiogram. This is a procedure to look at the coronary arteries to see if there is any blockage.    TREATMENT   The treatment of angina may include the following:  · Healthy behavioral changes to reduce or control risk factors.  · Medicine.  · Coronary stenting. A stent helps to keep an artery open.  · Coronary angioplasty. This procedure widens a narrowed or blocked artery.  · Coronary artery bypass surgery. This will allow your blood to pass the blockage (bypass) to reach your heart.  HOME CARE INSTRUCTIONS   · Take medicines only as directed by your health care provider.  · Do not take the following medicines unless your health care provider approves:    Nonsteroidal anti-inflammatory drugs (NSAIDs), such as ibuprofen,  naproxen, or celecoxib.    Vitamin supplements that contain vitamin A, vitamin E, or both.    Hormone replacement therapy that contains estrogen with or without progestin.  · Manage other health conditions such as hypertension and diabetes as directed by your health care provider.  · Follow a heart-healthy diet. A dietitian can help to educate you about healthy food options and changes.  · Use healthy cooking methods such as roasting, grilling, broiling, baking, poaching, steaming, or stir-frying. Talk to a dietitian to learn more about healthy cooking methods.  · Follow an exercise program approved by your health care provider.  · Maintain a healthy weight. Lose weight as approved by your health care provider.  · Plan rest periods when fatigued.  · Learn to manage stress.  · Do not use any tobacco products, including cigarettes, chewing tobacco, or electronic cigarettes. If you need help quitting, ask your health care provider.  · If you drink alcohol, and your health care provider approves, limit your alcohol intake to no more than 1 drink per day. One drink equals 12 ounces of beer, 5 ounces of wine, or 1½ ounces of hard liquor.  · Stop illegal drug use.  · Keep all follow-up visits as directed by your health care provider. This is important.  SEEK IMMEDIATE MEDICAL CARE IF:   · You have pain in your chest, neck, arm, jaw, stomach, or back that lasts more than a few minutes, is recurring, or is unrelieved by taking sublingual nitroglycerin.  · You have profuse sweating without cause.  · You have unexplained:    Heartburn or indigestion.    Shortness of breath or difficulty breathing.    Nausea or vomiting.    Fatigue.    Feelings of nervousness or anxiety.    Weakness.    Diarrhea.  · You have sudden light-headedness or dizziness.  · You faint.  These symptoms may represent a serious problem that is an emergency. Do not wait to see if the symptoms will go away. Get medical help right away. Call your local  emergency services (911 in the [place] Do not drive yourself to the hospital.     This information is not intended to replace advice given to you by your health care provider. Make sure you discuss any questions you have with your health care provider.     Document Released: 03/19/2005 Document Revised: 04/09/2014 Document Reviewed: 07/21/2013  Elsevier Interactive Patient Education ©2016 [employer]

## 2015-06-08 NOTE — Subjective & Objective (Addendum)
Feels relatively OK. Had some aching in R leg this AM.   No CP.  Cough is stable. Anxious  BP 142/69 mmHg  Pulse 80  Temp(Src) 98.1 F (36.7 C) (Oral)  Resp 14  Ht 5\' 3"  (1.6 m)  Wt 200 lb 4.8 oz (90.855 kg)  BMI 35.49 kg/m2  SpO2 99%  General appearance: alert, cooperative, appears stated age and no distress Neck: no adenopathy, no carotid bruit, no JVD, supple, symmetrical, trachea midline and thyroid not enlarged, symmetric, no tenderness/mass/nodules Lungs: clear to auscultation bilaterally and normal percussion bilaterally Heart: regular rate and rhythm, S1, S2 normal, no murmur, click, rub or gallop Abdomen: soft, non-tender; bowel sounds normal; no masses,  no organomegaly Extremities: edema 1-22+ non-pitting Pulses: 2+ and symmetric

## 2015-06-08 NOTE — Progress Notes (Signed)
ANTICOAGULATION CONSULT NOTE - Follow Up Consult  Pharmacy Consult for Heparin Indication: chest pain/ACS  Allergies  Allergen Reactions  . Pollen Extract Other (See Comments)    Seasonal allergies    Patient Measurements: Height: 5\' 3"  (160 cm) Weight: 200 lb 4.8 oz (90.855 kg) IBW/kg (Calculated) : 52.4 Heparin Dosing Weight: 73 kg  Vital Signs: Temp: 98.1 F (36.7 C) (03/08 0518) Temp Source: Oral (03/08 0518) BP: 138/67 mmHg (03/08 0518) Pulse Rate: 77 (03/08 0518)  Labs:  Recent Labs  06/06/15 2221  06/07/15 0322 06/07/15 0323 06/07/15 0815 06/07/15 0933 06/07/15 1437 06/07/15 2150 06/08/15 0417 06/08/15 0430  HGB 13.7  --   --   --   --   --   --   --   --  11.8*  HCT 43.5  --   --   --   --   --   --   --   --  37.7  PLT 219  --   --   --   --   --   --   --   --  183  APTT  --   < > 26  --   --  33 98* 79*  --  116*  LABPROT  --   --   --  15.5*  --   --   --   --  15.3*  --   INR  --   --   --  1.22  --   --   --   --  1.20  --   HEPARINUNFRC  --   --   --  0.89*  --   --   --   --   --  1.20*  CREATININE 1.28*  --   --   --   --   --   --   --  1.18* 1.17*  TROPONINI 0.06*  --  0.05*  --  0.04* 0.04*  --   --   --   --   < > = values in this interval not displayed.  Estimated Creatinine Clearance: 45.8 mL/min (by C-G formula based on Cr of 1.17).  Assessment: 75 YOF on apixaban PTA for history of PE - held on admit in anticipation that cardiac procedures may be needed - and transitioned to heparin. aPTT level supratherapeutic (116 sec) on 1200 units/hr. Heparin level remains elevated - may still be affected by apixaban. Hgb down a bit, no bleeding noted.  Goal of Therapy:  Heparin level 0.3-0.7 units/ml aPTT 66-102 seconds Monitor platelets by anticoagulation protocol: Yes   Plan:  Decrease heparin to 1050 units/hr  Will f/u PTT in 8 hours  Sherlon Handing, PharmD, BCPS Clinical pharmacist, pager [phone] 06/08/2015 6:09 AM

## 2015-06-08 NOTE — Interval H&P Note (Signed)
Cath Lab Visit (complete for each Cath Lab visit)  Clinical Evaluation Leading to the Procedure:   ACS: Yes.    Non-ACS:    Anginal Classification: CCS III  Anti-ischemic medical therapy: Minimal Therapy (1 class of medications)  Non-Invasive Test Results: Intermediate-risk stress test findings: cardiac mortality 1-3%/year  Prior CABG: No previous CABG      History and Physical Interval Note:  06/08/2015 11:44 AM  [name]  has presented today for surgery, with the diagnosis of unstable angina  The various methods of treatment have been discussed with the patient and family. After consideration of risks, benefits and other options for treatment, the patient has consented to  Procedure(s): Left Heart Cath and Coronary Angiography (N/A) as a surgical intervention .  The patient's history has been reviewed, patient examined, no change in status, stable for surgery.  I have reviewed the patient's chart and labs.  Questions were answered to the patient's satisfaction.     Sinclair Grooms

## 2015-06-08 NOTE — Assessment & Plan Note (Signed)
Showed reduced EF with Inferior Infarct & peri-infarct ischemia -- confirmed by cath today. mRCA CTO - with collaterals. EF on LV Gram looks better than 25%.

## 2015-06-08 NOTE — H&P (View-Only) (Signed)
[facility] PROGRESS NOTE  [name] [id number] DOB: [birth year] DOA: 06/06/2015 PCP: Binnie Rail, MD  Length of stay:    Assessment/Plan: Principal Problem:   Elevated troponin Active Problems:   Dyslipidemia   Fibromyalgia   HTN (hypertension)   Obese   History of pulmonary embolus (PE)-May 2016   Chest pain   Chronic anticoagulation-Eliquis   CRI stage 3, GFR 30-59 mL/min   Abnormal nuclear stress test   Cardiomyopathy, ischemic - suggested by Myoview    Brief summary 75 y/o female, seen by cardiology in the past with a history of HTN and dyslipidemia. Prior Nuclear stress scans in 2007 and 2011 negative. No history of MI or CAD. She was seen by Dr Ashok Cordia 06/06/15 and had complained of persistent cough as well as atypical chest pain. Troponins ere ordered and have come back abnormal 0.06-0.04. The pt complains of localized Rt sided chest pain. She has had this for > month. It is not exertional, no convincing assocaited symptoms. Myoview was ordered on admission.   Assessment and plan  1. Chest pain with elevated troponin - chest pain appears atypical but patient's troponin is elevated, Myoview shows global hypokinesia, EF of 25%. Patient scheduled for right and left heart cath today. Patient is on Apixaban and since anticipation of possible procedure we will keep patient on heparin. 2-D echo pending.   2. Acute renal failure - patient's creatinine is mildly elevated.  Patient receiving IV fluids for cardiac cath. Currently on Cozaar, may also need Lasix prior to discharge, monitor renal function closely 3. History of PE on Apixaban - in anticipation of possible cardiac procedures I have placed patient on heparin and will hold Apixaban for now. 4. Hypertension - see #2. 5. History of seizure on Lamictal. 6. History of chronic cough with possible cough variant asthma. 7. GERD. PPI  DVT prophylaxsis heparin drip  Code Status:      Code Status Orders         Start     Ordered   06/07/15 0224  Full code   Continuous     06/07/15 0224       Family Communication: Discussed in detail with the patient, all imaging results, lab results explained to the patient   Disposition Plan: Pending cardiology recommendations    Consultants:  Cardiology  Procedures:  None  Antibiotics: Anti-infectives    Start     Dose/Rate Route Frequency Ordered Stop   06/07/15 0645  levofloxacin (LEVAQUIN) IVPB 750 mg     750 mg 100 mL/hr over 90 Minutes Intravenous Every 48 hours 06/07/15 0630           HPI/Subjective: Denies any chest pain, shortness of breath, continues to have cough  Objective: Filed Vitals:   06/07/15 1408 06/07/15 2013 06/08/15 0019 06/08/15 0518  BP: 150/75 131/75 110/58 138/67  Pulse: 85 85 87 77  Temp: 97.9 F (36.6 C) 98.6 F (37 C) 98.5 F (36.9 C) 98.1 F (36.7 C)  TempSrc: Oral Oral Oral Oral  Resp: 17 18 18 18   Height:      Weight:    90.855 kg (200 lb 4.8 oz)  SpO2: 100% 99% 93% 98%    Intake/Output Summary (Last 24 hours) at 06/08/15 0848 Last data filed at 06/08/15 0648  Gross per 24 hour  Intake  915.6 ml  Output      0 ml  Net  915.6 ml    Exam:  General: No  acute respiratory distress Lungs: Clear to auscultation bilaterally without wheezes or crackles Cardiovascular: Regular rate and rhythm without murmur gallop or rub normal S1 and S2 Abdomen: Nontender, nondistended, soft, bowel sounds positive, no rebound, no ascites, no appreciable mass Extremities: No significant cyanosis, clubbing, or edema bilateral lower extremities     Data Review   Micro Results No results found for this or any previous visit (from the past 240 hour(s)).  Radiology Reports Dg Chest 2 View  06/06/2015  CLINICAL DATA:  Cough and right-sided chest pain for 2 days. EXAM: CHEST  2 VIEW COMPARISON:  05/02/2015 FINDINGS: Normal heart size and pulmonary vascularity. Emphysematous changes in the lungs. Suggestion  of vague patchy infiltration in the lung bases possibly representing early pneumonia. Scarring in the right mid lung. No pleural effusions. No pneumothorax. IMPRESSION: Emphysematous changes in the lungs with suggestion of patchy infiltration in the lung bases. Electronically Signed   By: Lucienne Capers M.D.   On: 06/06/2015 22:45   Nm Myocar Multi W/spect W/wall Motion / Ef  06/07/2015  CLINICAL DATA:  Chest pain EXAM: MYOCARDIAL IMAGING WITH SPECT (REST AND PHARMACOLOGIC-STRESS) GATED LEFT VENTRICULAR WALL MOTION STUDY LEFT VENTRICULAR EJECTION FRACTION TECHNIQUE: Standard myocardial SPECT imaging was performed after resting intravenous injection of 10 mCi Tc-48m sestamibi. Subsequently, intravenous infusion of Lexiscan was performed under the supervision of the Cardiology staff. At peak effect of the drug, 30 mCi Tc-18m sestamibi was injected intravenously and standard myocardial SPECT imaging was performed. Quantitative gated imaging was also performed to evaluate left ventricular wall motion, and estimate left ventricular ejection fraction. COMPARISON:  None. FINDINGS: Perfusion: There is a large amount of gastrointestinal and hepatic activity. This severely limits perfusion defect determination. There is an apparent defect at the inferior apex on stress images which re- perfuses on rest imaging. This may be artifactual. There is also diminished diffuse and profusion within the inferior wall. Again, this may be a true perfusion defect or artifactual. Wall Motion: Severe global hypokinesis is noted. Left Ventricular Ejection Fraction: 25 % End diastolic volume 56 ml End systolic volume 42 ml IMPRESSION: 1. The study was limited. There is diminished perfusion in the inferior wall an a possible stress-induced perfusion defect at the inferior apex as described above. These findings may be artifactual. 2. Severe global hypokinesis. 3. Left ventricular ejection fraction 25% 4. High-risk stress test findings*.  *2012 Appropriate Use Criteria for Coronary Revascularization Focused Update: J Am Coll Cardiol. B5713794. http://content.[email or web address]?[email or web address] Electronically Signed   By: Marybelle Killings M.D.   On: 06/07/2015 16:41     CBC  Recent Labs Lab 06/06/15 2221 06/08/15 0430  WBC 7.6 6.8  HGB 13.7 11.8*  HCT 43.5 37.7  PLT 219 183  MCV 88.8 88.1  MCH 28.0 27.6  MCHC 31.5 31.3  RDW 14.2 14.5  LYMPHSABS 3.1  --   MONOABS 0.6  --   EOSABS 0.1  --   BASOSABS 0.0  --     Chemistries   Recent Labs Lab 06/06/15 2221 06/08/15 0417 06/08/15 0430  NA 142 140 142  K 5.1 4.2 4.2  CL 103 110 107  CO2 26 25 25   GLUCOSE 110* 116* 111*  BUN 21* 16 17  CREATININE 1.28* 1.18* 1.17*  CALCIUM 9.8 8.5* 8.6*  AST  --   --  18  ALT  --   --  16  ALKPHOS  --   --  92  BILITOT  --   --  0.3   ------------------------------------------------------------------------------------------------------------------  estimated creatinine clearance is 45.8 mL/min (by C-G formula based on Cr of 1.17). ------------------------------------------------------------------------------------------------------------------ No results for input(s): HGBA1C in the last 72 hours. ------------------------------------------------------------------------------------------------------------------ No results for input(s): CHOL, HDL, LDLCALC, TRIG, CHOLHDL, LDLDIRECT in the last 72 hours. ------------------------------------------------------------------------------------------------------------------ No results for input(s): TSH, T4TOTAL, T3FREE, THYROIDAB in the last 72 hours.  Invalid input(s): FREET3 ------------------------------------------------------------------------------------------------------------------ No results for input(s): VITAMINB12, FOLATE, FERRITIN, TIBC, IRON, RETICCTPCT in the last 72 hours.  Coagulation profile  Recent Labs Lab 06/07/15 0323 06/08/15 0417  INR  1.22 1.20    No results for input(s): DDIMER in the last 72 hours.  Cardiac Enzymes  Recent Labs Lab 06/07/15 0322 06/07/15 0815 06/07/15 0933  TROPONINI 0.05* 0.04* 0.04*   ------------------------------------------------------------------------------------------------------------------ Invalid input(s): POCBNP   CBG: No results for input(s): GLUCAP in the last 168 hours.     Studies: Dg Chest 2 View  06/06/2015  CLINICAL DATA:  Cough and right-sided chest pain for 2 days. EXAM: CHEST  2 VIEW COMPARISON:  05/02/2015 FINDINGS: Normal heart size and pulmonary vascularity. Emphysematous changes in the lungs. Suggestion of vague patchy infiltration in the lung bases possibly representing early pneumonia. Scarring in the right mid lung. No pleural effusions. No pneumothorax. IMPRESSION: Emphysematous changes in the lungs with suggestion of patchy infiltration in the lung bases. Electronically Signed   By: Lucienne Capers M.D.   On: 06/06/2015 22:45   Nm Myocar Multi W/spect W/wall Motion / Ef  06/07/2015  CLINICAL DATA:  Chest pain EXAM: MYOCARDIAL IMAGING WITH SPECT (REST AND PHARMACOLOGIC-STRESS) GATED LEFT VENTRICULAR WALL MOTION STUDY LEFT VENTRICULAR EJECTION FRACTION TECHNIQUE: Standard myocardial SPECT imaging was performed after resting intravenous injection of 10 mCi Tc-58m sestamibi. Subsequently, intravenous infusion of Lexiscan was performed under the supervision of the Cardiology staff. At peak effect of the drug, 30 mCi Tc-63m sestamibi was injected intravenously and standard myocardial SPECT imaging was performed. Quantitative gated imaging was also performed to evaluate left ventricular wall motion, and estimate left ventricular ejection fraction. COMPARISON:  None. FINDINGS: Perfusion: There is a large amount of gastrointestinal and hepatic activity. This severely limits perfusion defect determination. There is an apparent defect at the inferior apex on stress images which  re- perfuses on rest imaging. This may be artifactual. There is also diminished diffuse and profusion within the inferior wall. Again, this may be a true perfusion defect or artifactual. Wall Motion: Severe global hypokinesis is noted. Left Ventricular Ejection Fraction: 25 % End diastolic volume 56 ml End systolic volume 42 ml IMPRESSION: 1. The study was limited. There is diminished perfusion in the inferior wall an a possible stress-induced perfusion defect at the inferior apex as described above. These findings may be artifactual. 2. Severe global hypokinesis. 3. Left ventricular ejection fraction 25% 4. High-risk stress test findings*. *2012 Appropriate Use Criteria for Coronary Revascularization Focused Update: J Am Coll Cardiol. B5713794. http://content.[email or web address].aspx?articleid=[email or web address] Electronically Signed   By: Marybelle Killings M.D.   On: 06/07/2015 16:41      Lab Results  Component Value Date   HGBA1C 6.2 05/31/2015   Lab Results  Component Value Date   LDLCALC 133* 02/09/2015   CREATININE 1.17* 06/08/2015       Scheduled Meds: . aspirin EC  325 mg Oral Daily  . atorvastatin  10 mg Oral Daily  . busPIRone  10 mg Oral TID  . lamoTRIgine  100 mg Oral BID  . levofloxacin (LEVAQUIN) IV  750 mg Intravenous Q48H  . losartan  100 mg Oral Daily  . montelukast  10 mg Oral QHS  . nortriptyline  25 mg Oral QHS  . pantoprazole  40 mg Oral Daily  . propranolol ER  120 mg Oral Daily  . regadenoson  0.4 mg Intravenous Once  . sodium chloride flush  3 mL Intravenous Q12H   Continuous Infusions: . sodium chloride 75 mL/hr at 06/08/15 0645  . heparin 1,050 Units/hr (06/08/15 0646)    Principal Problem:   Elevated troponin Active Problems:   Dyslipidemia   Fibromyalgia   HTN (hypertension)   Obese   History of pulmonary embolus (PE)-May 2016   Chest pain   Chronic anticoagulation-Eliquis   CRI stage 3, GFR 30-59 mL/min   Abnormal nuclear stress test    Cardiomyopathy, ischemic - suggested by Myoview    Time spent: 45 minutes   [facility] Pager [phone]. If 7PM-7AM, please contact night-coverage at [email or web address], password [facility] 06/08/2015, 8:48 AM

## 2015-06-08 NOTE — Assessment & Plan Note (Addendum)
LV Gram EF ~45-50%. Suspect inaccurate read on Myoview. Echo pending. She is already on ARB, BB & Lasix. With LVEDP ~14 & PCWP 16 - will increase PO Lasix to 40 mg. (? Could some of her cough be related to intermittent DHF with high LV filling pressures -- "Cardiac Asthma".  Pressures would not suggest that at this junction, but is reasonable to avoid increased LV filling)

## 2015-06-08 NOTE — Assessment & Plan Note (Signed)
I suspect that her R sided CP has nothing to do with her new CAD Dx. Probably MSK related with chronic cough.

## 2015-06-08 NOTE — Progress Notes (Signed)
Subjective/Objective Feels relatively OK. Had some aching in R leg this AM.   No CP.  Cough is stable. Anxious  BP 142/69 mmHg  Pulse 80  Temp(Src) 98.1 F (36.7 C) (Oral)  Resp 14  Ht 5\' 3"  (1.6 m)  Wt 200 lb 4.8 oz (90.855 kg)  BMI 35.49 kg/m2  SpO2 99%  General appearance: alert, cooperative, appears stated age and no distress Neck: no adenopathy, no carotid bruit, no JVD, supple, symmetrical, trachea midline and thyroid not enlarged, symmetric, no tenderness/mass/nodules Lungs: clear to auscultation bilaterally and normal percussion bilaterally Heart: regular rate and rhythm, S1, S2 normal, no murmur, click, rub or gallop Abdomen: soft, non-tender; bowel sounds normal; no masses,  no organomegaly Extremities: edema 1-22+ non-pitting Pulses: 2+ and symmetric   Scheduled Meds: . [MAR Hold] aspirin EC  325 mg Oral Daily  . [MAR Hold] atorvastatin  10 mg Oral Daily  . [MAR Hold] busPIRone  10 mg Oral TID  . [MAR Hold] lamoTRIgine  100 mg Oral BID  . [MAR Hold] levofloxacin (LEVAQUIN) IV  750 mg Intravenous Q48H  . [MAR Hold] losartan  100 mg Oral Daily  . [MAR Hold] montelukast  10 mg Oral QHS  . [MAR Hold] nortriptyline  25 mg Oral QHS  . [MAR Hold] pantoprazole  40 mg Oral Daily  . [MAR Hold] propranolol ER  120 mg Oral Daily  . regadenoson  0.4 mg Intravenous Once  . sodium chloride flush  3 mL Intravenous Q12H   Continuous Infusions: . sodium chloride 75 mL/hr at 06/08/15 0645  . sodium chloride 1 mL/kg/hr (06/08/15 1252)  . heparin 1,050 Units/hr (06/08/15 0646)   PRN Meds:sodium chloride, [MAR Hold] acetaminophen, [MAR Hold] albuterol, [MAR Hold] benzonatate, [MAR Hold] butalbital-acetaminophen-caffeine, [MAR Hold]  morphine injection, nitroGLYCERIN, [MAR Hold] ondansetron (ZOFRAN) IV, sodium chloride flush, [MAR Hold] zolpidem  Vital signs in last 24 hours: Temp:  [97.9 F (36.6 C)-98.6 F (37 C)] 98.1 F (36.7 C) (03/08 0518) Pulse Rate:  [77-106] 80  (03/08 1255) Resp:  [10-22] 14 (03/08 1255) BP: (110-154)/(58-87) 142/69 mmHg (03/08 1255) SpO2:  [93 %-100 %] 99 % (03/08 1255) Weight:  [200 lb 4.8 oz (90.855 kg)] 200 lb 4.8 oz (90.855 kg) (03/08 0518)  Intake/Output last 3 shifts: I/O last 3 completed shifts: In: 915.6 [P.O.:600; I.V.:315.6] Out: 300 [Urine:300] Intake/Output this shift:    CARDIAC CATH: Conclusion    1. Mid RCA lesion, 100% stenosed. 2. Ost 1st Diag lesion, 50% stenosed. 3. Prox LAD lesion, 50% stenosed. 4. Ost 1st Sept lesion, 70% stenosed. 5. Ost 2nd Mrg to 2nd Mrg lesion, 70% stenosed. 6. Mid Cx lesion, 70% stenosed.   Chronic total occlusion of the dominant right coronary in the midsegment. The right coronary artery is well collateralized from the left coronary system predominantly around the left ventricular apex via the LAD.  Moderate proximal to mid LAD stenosis and moderate to moderately severe second obtuse marginal stenosis.  Low normal to normal left ventricular systolic function with EF 50% and normal left ventricular end-diastolic pressure.  Normal pulmonary artery pressures.  RECOMMENDATIONS:   Antiplatelet therapy after completion of anticoagulation therapy for DVT.  No acute coronary/ischemic heart disease issue is identified at this time.  The troponin leak is likely demand related in the setting of chronic total occlusion of the right coronary.  Will have the CTO team look at the patient's angiograms to determine if she could be a candidate for CTO revascularization. This is important given  the dependency of the dominant right coronary territory on the LAD. The LAD has moderate disease.  Needs chronic cardiology follow up.   Diagnostic Diagram        Problem Assessment/Plan Cardiovascular and Mediastinum Cardiomyopathy, ischemic - suggested by Myoview Assessment & Plan LV Gram EF ~45-50%. Suspect inaccurate read on Myoview. Echo pending. She is already on ARB, BB &  Lasix. With LVEDP ~14 & PCWP 16 - will increase PO Lasix to 40 mg. (? Could some of her cough be related to intermittent DHF with high LV filling pressures -- "Cardiac Asthma".  Pressures would not suggest that at this junction, but is reasonable to avoid increased LV filling)  Coronary artery disease, occlusive: mid RCA CTO with L-R collaterals Assessment & Plan Good L-R collaterals, but evidence of ischemia on Myoview.   She is not noting anginal pain - but this could explain mild troponin elevation with watershed ischemia.  Will keep on ASA - but can reduce to 81 mg. No Plavix as she is on Eliquis -- is on statin - would increase to 40mg  atorvastatin Will have her see Dr. Beau Fanny or [place] (CTO team) to review anatomy & symptoms to determine if she will benefit from CTO revascularization  Essential hypertension Assessment & Plan Seems to be controlled on current meds.  Other Abnormal nuclear stress test Assessment & Plan Showed reduced EF with Inferior Infarct & peri-infarct ischemia -- confirmed by cath today. mRCA CTO - with collaterals. EF on LV Gram looks better than 25%.  Dyslipidemia, goal LDL below 70 Assessment & Plan More aggressive goal with documented CAD -- increase Lipitor to 40mg .  Chest wall pain Assessment & Plan I suspect that her R sided CP has nothing to do with her new CAD Dx. Probably MSK related with chronic cough.   I do not think that she needs to stay for additional evaluation or Rx.  With Medication adjustments made, can probably d/c after Bed Rest.  Will need APP f/u appointment - then would schedule with Dr. Martinique for CTO Consultation  & me for continuity.   Leonie Man, M.D., M.S. Interventional Cardiologist   Pager # [phone] Phone # [phone] [address] [place], [place] [address]

## 2015-06-08 NOTE — Assessment & Plan Note (Signed)
Seems to be controlled on current meds.

## 2015-06-08 NOTE — Progress Notes (Signed)
  Echocardiogram 2D Echocardiogram has been performed.  [name] 06/08/2015, 10:30 AM

## 2015-06-09 ENCOUNTER — Telehealth: Payer: Self-pay | Admitting: Pulmonary Disease

## 2015-06-09 DIAGNOSIS — R079 Chest pain, unspecified: Secondary | ICD-10-CM | POA: Insufficient documentation

## 2015-06-09 DIAGNOSIS — R0789 Other chest pain: Secondary | ICD-10-CM | POA: Diagnosis not present

## 2015-06-09 DIAGNOSIS — R072 Precordial pain: Secondary | ICD-10-CM

## 2015-06-09 DIAGNOSIS — R931 Abnormal findings on diagnostic imaging of heart and coronary circulation: Secondary | ICD-10-CM | POA: Diagnosis not present

## 2015-06-09 DIAGNOSIS — R7989 Other specified abnormal findings of blood chemistry: Secondary | ICD-10-CM | POA: Diagnosis not present

## 2015-06-09 DIAGNOSIS — I255 Ischemic cardiomyopathy: Secondary | ICD-10-CM | POA: Diagnosis not present

## 2015-06-09 LAB — CBC
HCT: 38.1 % (ref 36.0–46.0)
Hemoglobin: 12.4 g/dL (ref 12.0–15.0)
MCH: 28.7 pg (ref 26.0–34.0)
MCHC: 32.5 g/dL (ref 30.0–36.0)
MCV: 88.2 fL (ref 78.0–100.0)
PLATELETS: 183 10*3/uL (ref 150–400)
RBC: 4.32 MIL/uL (ref 3.87–5.11)
RDW: 14.4 % (ref 11.5–15.5)
WBC: 6 10*3/uL (ref 4.0–10.5)

## 2015-06-09 LAB — COMPREHENSIVE METABOLIC PANEL
ALBUMIN: 2.8 g/dL — AB (ref 3.5–5.0)
ALT: 17 U/L (ref 14–54)
ANION GAP: 7 (ref 5–15)
AST: 21 U/L (ref 15–41)
Alkaline Phosphatase: 74 U/L (ref 38–126)
BILIRUBIN TOTAL: 0.5 mg/dL (ref 0.3–1.2)
BUN: 16 mg/dL (ref 6–20)
CO2: 24 mmol/L (ref 22–32)
Calcium: 8.9 mg/dL (ref 8.9–10.3)
Chloride: 107 mmol/L (ref 101–111)
Creatinine, Ser: 1.2 mg/dL — ABNORMAL HIGH (ref 0.44–1.00)
GFR calc Af Amer: 51 mL/min — ABNORMAL LOW (ref >60)
GFR, EST NON AFRICAN AMERICAN: 44 mL/min — AB (ref >60)
GLUCOSE: 103 mg/dL — AB (ref 65–99)
POTASSIUM: 4.4 mmol/L (ref 3.5–5.1)
Sodium: 138 mmol/L (ref 135–145)
TOTAL PROTEIN: 6.3 g/dL — AB (ref 6.5–8.1)

## 2015-06-09 LAB — PROCALCITONIN: Procalcitonin: <0.1 ng/mL

## 2015-06-09 MED ORDER — IRBESARTAN 150 MG PO TABS: 150.0000 mg | ORAL_TABLET | Freq: Every day | ORAL | Status: DC

## 2015-06-09 MED ORDER — PANTOPRAZOLE SODIUM 40 MG PO TBEC: 40.0000 mg | DELAYED_RELEASE_TABLET | Freq: Every day | ORAL | Status: DC

## 2015-06-09 MED ORDER — FUROSEMIDE 40 MG PO TABS: 40.0000 mg | ORAL_TABLET | Freq: Every day | ORAL | Status: DC

## 2015-06-09 MED ORDER — ATORVASTATIN CALCIUM 40 MG PO TABS: 40.0000 mg | ORAL_TABLET | Freq: Every day | ORAL | Status: DC

## 2015-06-09 MED ORDER — LEVOFLOXACIN 500 MG PO TABS: 500.0000 mg | ORAL_TABLET | Freq: Every day | ORAL | Status: DC

## 2015-06-09 MED ORDER — ASPIRIN 81 MG PO TBEC: 81.0000 mg | DELAYED_RELEASE_TABLET | Freq: Every day | ORAL | Status: DC

## 2015-06-09 MED ORDER — IRBESARTAN 300 MG PO TABS: 150.0000 mg | ORAL_TABLET | Freq: Every day | ORAL | Status: DC

## 2015-06-09 MED FILL — Nitroglycerin IV Soln 100 MCG/ML in D5W: INTRA_ARTERIAL | Qty: 10 | Status: AC

## 2015-06-09 NOTE — Assessment & Plan Note (Addendum)
LV Gram EF ~45-50% - with normal EF of 55-60% byinaccurate read on Myoview.  She is already on ARB, BB & Lasix. --   With her cough, I would switch her from losartan 100 mg to irbesartan starting at 150 mg  Increased by mouth Lasix to 40 mg due to elevated wedge pressure and EDP. -- Perhaps some of her cough could be related to increased filling pressures and pulmonary vascular congestion.

## 2015-06-09 NOTE — Discharge Summary (Signed)
Physician Discharge Summary  [name] MRN: [id number] DOB/AGE: 1940/05/07 75 y.o.  PCP: Binnie Rail, MD   Admit date: 06/06/2015 Discharge date: 06/09/2015  Discharge Diagnoses:   Principal Problem:   Elevated troponin Active Problems:   Dyslipidemia, goal LDL below 70   Fibromyalgia   Essential hypertension   Obese   History of pulmonary embolus (PE)-May 2016   Chest wall pain   Chronic anticoagulation-Eliquis   CRI stage 3, GFR 30-59 mL/min   Abnormal nuclear stress test   Cardiomyopathy, ischemic - suggested by Myoview   Coronary artery disease, occlusive: mid RCA CTO with L-R collaterals   Pain in the chest   Chest pain    Follow-up recommendations Follow-up with PCP in 3-5 days , including all  additional recommended appointments as below Follow-up CBC, CMP in 3-5 days Patient to follow-up with cardiology for CTO Consultation      Medication List    STOP taking these medications        butalbital-acetaminophen-caffeine 50-325-40 MG tablet  Commonly known as:  FIORICET, ESGIC     losartan 100 MG tablet  Commonly known as:  COZAAR     omeprazole 40 MG capsule  Commonly known as:  PRILOSEC  Replaced by:  pantoprazole 40 MG tablet      TAKE these medications        apixaban 5 MG Tabs tablet  Commonly known as:  ELIQUIS  Take 1 tablet (5 mg total) by mouth 2 (two) times daily.     aspirin 81 MG EC tablet  Take 1 tablet (81 mg total) by mouth daily.     atorvastatin 40 MG tablet  Commonly known as:  LIPITOR  Take 1 tablet (40 mg total) by mouth daily.     benzonatate 100 MG capsule  Commonly known as:  TESSALON  Take 1-2 capsules (100-200 mg total) by mouth 3 (three) times daily as needed for cough.     busPIRone 10 MG tablet  Commonly known as:  BUSPAR  TAKE 1 TABLET BY MOUTH 3 TIMES A DAY     CALCIUM 500/D 500-400 MG-UNIT Chew  Generic drug:  Calcium Carb-Cholecalciferol  CHEW TWO TABLETS BY MOUTH THREE TIMES DAILY.      furosemide 40 MG tablet  Commonly known as:  LASIX  Take 1 tablet (40 mg total) by mouth daily.     irbesartan 150 MG tablet  Commonly known as:  AVAPRO  Take 1 tablet (150 mg total) by mouth daily.     LAMICTAL XR 100 MG Tb24  Generic drug:  LamoTRIgine  Take 2 tablets (200 mg total) by mouth at bedtime.     levofloxacin 500 MG tablet  Commonly known as:  LEVAQUIN  Take 1 tablet (500 mg total) by mouth daily.     montelukast 10 MG tablet  Commonly known as:  SINGULAIR  Take 1 tablet (10 mg total) by mouth at bedtime.     nortriptyline 25 MG capsule  Commonly known as:  PAMELOR  TAKE 1 CAPSULE BY MOUTH EVERY NIGHT AT BEDTIME     pantoprazole 40 MG tablet  Commonly known as:  PROTONIX  Take 1 tablet (40 mg total) by mouth daily.     propranolol ER 120 MG 24 hr capsule  Commonly known as:  INDERAL LA  Take 1 capsule (120 mg total) by mouth daily.     triamcinolone cream 0.5 %  Commonly known as:  KENALOG  apply to affected area three times  a day as needed for rash/itching     valACYclovir 1000 MG tablet  Commonly known as:  VALTREX  TAKE 1 TABLET BY MOUTH ONCE DAILY AS NEEDED FOR OUTBREAKS     VENTOLIN HFA 108 (90 Base) MCG/ACT inhaler  Generic drug:  albuterol  INHALE 2 PUFFS BY MOUTH EVERY 6 HOURS AS NEEDED FOR WHEEZING     zolpidem 5 MG tablet  Commonly known as:  AMBIEN  Take 1 tablet (5 mg total) by mouth at bedtime as needed for sleep. Not to be taken chronically.         Discharge Condition: Stable   Discharge Instructions       Discharge Instructions    Diet - low sodium heart healthy    Complete by:  As directed      Increase activity slowly    Complete by:  As directed            Allergies  Allergen Reactions  . Pollen Extract Other (See Comments)    Seasonal allergies      Disposition: 01-Home or Self Care   Consults:  Cardiology     Significant Diagnostic Studies:  Dg Chest 2 View  06/06/2015  CLINICAL DATA:  Cough and  right-sided chest pain for 2 days. EXAM: CHEST  2 VIEW COMPARISON:  05/02/2015 FINDINGS: Normal heart size and pulmonary vascularity. Emphysematous changes in the lungs. Suggestion of vague patchy infiltration in the lung bases possibly representing early pneumonia. Scarring in the right mid lung. No pleural effusions. No pneumothorax. IMPRESSION: Emphysematous changes in the lungs with suggestion of patchy infiltration in the lung bases. Electronically Signed   By: Lucienne Capers M.D.   On: 06/06/2015 22:45   Nm Myocar Multi W/spect W/wall Motion / Ef  06/07/2015  CLINICAL DATA:  Chest pain EXAM: MYOCARDIAL IMAGING WITH SPECT (REST AND PHARMACOLOGIC-STRESS) GATED LEFT VENTRICULAR WALL MOTION STUDY LEFT VENTRICULAR EJECTION FRACTION TECHNIQUE: Standard myocardial SPECT imaging was performed after resting intravenous injection of 10 mCi Tc-18msestamibi. Subsequently, intravenous infusion of Lexiscan was performed under the supervision of the Cardiology staff. At peak effect of the drug, 30 mCi Tc-91mestamibi was injected intravenously and standard myocardial SPECT imaging was performed. Quantitative gated imaging was also performed to evaluate left ventricular wall motion, and estimate left ventricular ejection fraction. COMPARISON:  None. FINDINGS: Perfusion: There is a large amount of gastrointestinal and hepatic activity. This severely limits perfusion defect determination. There is an apparent defect at the inferior apex on stress images which re- perfuses on rest imaging. This may be artifactual. There is also diminished diffuse and profusion within the inferior wall. Again, this may be a true perfusion defect or artifactual. Wall Motion: Severe global hypokinesis is noted. Left Ventricular Ejection Fraction: 25 % End diastolic volume 56 ml End systolic volume 42 ml IMPRESSION: 1. The study was limited. There is diminished perfusion in the inferior wall an a possible stress-induced perfusion defect at the  inferior apex as described above. These findings may be artifactual. 2. Severe global hypokinesis. 3. Left ventricular ejection fraction 25% 4. High-risk stress test findings*. *2012 Appropriate Use Criteria for Coronary Revascularization Focused Update: J Am Coll Cardiol. 204010;27(2):536-644http://[email or web address].comspx?articleid=[email or web address] Electronically Signed   By: ArMarybelle Killings.D.   On: 06/07/2015 16:41     CARDIAC CATH: Conclusion    1. Mid RCA lesion, 100% stenosed. 2. Ost 1st Diag lesion, 50% stenosed. 3. Prox LAD lesion, 50% stenosed. 4. Ost 1st Sept lesion, 70% stenosed. 5. Ost 2nd  Mrg to 2nd Mrg lesion, 70% stenosed. 6. Mid Cx lesion, 70% stenosed.   Chronic total occlusion of the dominant right coronary in the midsegment. The right coronary artery is well collateralized from the left coronary system predominantly around the left ventricular apex via the LAD.  Moderate proximal to mid LAD stenosis and moderate to moderately severe second obtuse marginal stenosis.  Low normal to normal left ventricular systolic function with EF 50% and normal left ventricular end-diastolic pressure.  Normal pulmonary artery pressures.  RECOMMENDATIONS:   Antiplatelet therapy after completion of anticoagulation therapy for DVT.  No acute coronary/ischemic heart disease issue is identified at this time.  The troponin leak is likely demand related in the setting of chronic total occlusion of the right coronary.  Will have the CTO team look at the patient's angiograms to determine if she could be a candidate for CTO revascularization. This is important given the dependency of the dominant right coronary territory on the LAD. The LAD has moderate disease.  Needs chronic cardiology follow up.   Diagnostic Diagram              Filed Weights   06/07/15 0154 06/08/15 0518 06/09/15 0551  Weight: 90.447 kg (199 lb 6.4 oz) 90.855 kg (200 lb 4.8 oz) 89.903 kg (198 lb  3.2 oz)     Microbiology: No results found for this or any previous visit (from the past 240 hour(s)).     Blood Culture    Component Value Date/Time   SDES URINE, CLEAN CATCH 06/05/2014 2341   SPECREQUEST NONE 06/05/2014 2341   CULT NO GROWTH Performed at [facility]  06/05/2014 2341   REPTSTATUS 06/07/2014 FINAL 06/05/2014 2341      Labs: Results for orders placed or performed during the hospital encounter of 06/06/15 (from the past 48 hour(s))  APTT     Status: Abnormal   Collection Time: 06/07/15  2:37 PM  Result Value Ref Range   aPTT 98 (H) 24 - 37 seconds    Comment:        IF BASELINE aPTT IS ELEVATED, SUGGEST PATIENT RISK ASSESSMENT BE USED TO DETERMINE APPROPRIATE ANTICOAGULANT THERAPY.   APTT     Status: Abnormal   Collection Time: 06/07/15  9:50 PM  Result Value Ref Range   aPTT 79 (H) 24 - 37 seconds    Comment:        IF BASELINE aPTT IS ELEVATED, SUGGEST PATIENT RISK ASSESSMENT BE USED TO DETERMINE APPROPRIATE ANTICOAGULANT THERAPY.   Protime-INR     Status: Abnormal   Collection Time: 06/08/15  4:17 AM  Result Value Ref Range   Prothrombin Time 15.3 (H) 11.6 - 15.2 seconds   INR 1.20 0.00 - 3.29  Basic metabolic panel     Status: Abnormal   Collection Time: 06/08/15  4:17 AM  Result Value Ref Range   Sodium 140 135 - 145 mmol/L   Potassium 4.2 3.5 - 5.1 mmol/L    Comment: DELTA CHECK NOTED   Chloride 110 101 - 111 mmol/L   CO2 25 22 - 32 mmol/L   Glucose, Bld 116 (H) 65 - 99 mg/dL   BUN 16 6 - 20 mg/dL   Creatinine, Ser 1.18 (H) 0.44 - 1.00 mg/dL   Calcium 8.5 (L) 8.9 - 10.3 mg/dL   GFR calc non Af Amer 45 (L) >60 mL/min   GFR calc Af Amer 52 (L) >60 mL/min    Comment: (NOTE) The eGFR has been calculated using the  CKD EPI equation. This calculation has not been validated in all clinical situations. eGFR's persistently <60 mL/min signify possible Chronic Kidney Disease.    Anion gap 5 5 - 15  Heparin level (unfractionated)      Status: Abnormal   Collection Time: 06/08/15  4:30 AM  Result Value Ref Range   Heparin Unfractionated 1.20 (H) 0.30 - 0.70 IU/mL    Comment: SPECIMEN CHECKED FOR CLOTS RESULTS CONFIRMED BY MANUAL DILUTION REPEATED TO VERIFY        IF HEPARIN RESULTS ARE BELOW EXPECTED VALUES, AND PATIENT DOSAGE HAS BEEN CONFIRMED, SUGGEST FOLLOW UP TESTING OF ANTITHROMBIN III LEVELS.   CBC     Status: Abnormal   Collection Time: 06/08/15  4:30 AM  Result Value Ref Range   WBC 6.8 4.0 - 10.5 K/uL   RBC 4.28 3.87 - 5.11 MIL/uL   Hemoglobin 11.8 (L) 12.0 - 15.0 g/dL   HCT 37.7 36.0 - 46.0 %   MCV 88.1 78.0 - 100.0 fL   MCH 27.6 26.0 - 34.0 pg   MCHC 31.3 30.0 - 36.0 g/dL   RDW 14.5 11.5 - 15.5 %   Platelets 183 150 - 400 K/uL  Comprehensive metabolic panel     Status: Abnormal   Collection Time: 06/08/15  4:30 AM  Result Value Ref Range   Sodium 142 135 - 145 mmol/L   Potassium 4.2 3.5 - 5.1 mmol/L    Comment: DELTA CHECK NOTED   Chloride 107 101 - 111 mmol/L   CO2 25 22 - 32 mmol/L   Glucose, Bld 111 (H) 65 - 99 mg/dL   BUN 17 6 - 20 mg/dL   Creatinine, Ser 1.17 (H) 0.44 - 1.00 mg/dL   Calcium 8.6 (L) 8.9 - 10.3 mg/dL   Total Protein 5.7 (L) 6.5 - 8.1 g/dL   Albumin 2.6 (L) 3.5 - 5.0 g/dL   AST 18 15 - 41 U/L   ALT 16 14 - 54 U/L   Alkaline Phosphatase 92 38 - 126 U/L   Total Bilirubin 0.3 0.3 - 1.2 mg/dL   GFR calc non Af Amer 45 (L) >60 mL/min   GFR calc Af Amer 52 (L) >60 mL/min    Comment: (NOTE) The eGFR has been calculated using the CKD EPI equation. This calculation has not been validated in all clinical situations. eGFR's persistently <60 mL/min signify possible Chronic Kidney Disease.    Anion gap 10 5 - 15  APTT     Status: Abnormal   Collection Time: 06/08/15  4:30 AM  Result Value Ref Range   aPTT 116 (H) 24 - 37 seconds    Comment:        IF BASELINE aPTT IS ELEVATED, SUGGEST PATIENT RISK ASSESSMENT BE USED TO DETERMINE APPROPRIATE ANTICOAGULANT  THERAPY. SPECIMEN CHECKED FOR CLOTS REPEATED TO VERIFY   I-STAT 3, venous blood gas (G3P V)     Status: Abnormal   Collection Time: 06/08/15 12:09 PM  Result Value Ref Range   pH, Ven 7.329 (H) 7.250 - 7.300   pCO2, Ven 47.1 45.0 - 50.0 mmHg   pO2, Ven 42.0 31.0 - 45.0 mmHg   Bicarbonate 24.8 (H) 20.0 - 24.0 mEq/L   TCO2 26 0 - 100 mmol/L   O2 Saturation 73.0 %   Acid-base deficit 1.0 0.0 - 2.0 mmol/L   Patient temperature HIDE    Sample type VENOUS   I-STAT 3, arterial blood gas (G3+)     Status: Abnormal   Collection Time:  06/08/15 12:25 PM  Result Value Ref Range   pH, Arterial 7.347 (L) 7.350 - 7.450   pCO2 arterial 46.1 (H) 35.0 - 45.0 mmHg   pO2, Arterial 97.0 80.0 - 100.0 mmHg   Bicarbonate 25.3 (H) 20.0 - 24.0 mEq/L   TCO2 27 0 - 100 mmol/L   O2 Saturation 97.0 %   Acid-base deficit 1.0 0.0 - 2.0 mmol/L   Patient temperature HIDE    Sample type ARTERIAL   POCT Activated clotting time     Status: None   Collection Time: 06/08/15 12:25 PM  Result Value Ref Range   Activated Clotting Time 255 seconds  POCT Activated clotting time     Status: None   Collection Time: 06/08/15  1:26 PM  Result Value Ref Range   Activated Clotting Time 204 seconds  CBC     Status: None   Collection Time: 06/09/15  5:24 AM  Result Value Ref Range   WBC 6.0 4.0 - 10.5 K/uL   RBC 4.32 3.87 - 5.11 MIL/uL   Hemoglobin 12.4 12.0 - 15.0 g/dL   HCT 38.1 36.0 - 46.0 %   MCV 88.2 78.0 - 100.0 fL   MCH 28.7 26.0 - 34.0 pg   MCHC 32.5 30.0 - 36.0 g/dL   RDW 14.4 11.5 - 15.5 %   Platelets 183 150 - 400 K/uL  Procalcitonin     Status: None   Collection Time: 06/09/15  5:24 AM  Result Value Ref Range   Procalcitonin <0.10 ng/mL    Comment:        Interpretation: PCT (Procalcitonin) <= 0.5 ng/mL: Systemic infection (sepsis) is not likely. Local bacterial infection is possible. (NOTE)         ICU PCT Algorithm               Non ICU PCT Algorithm    ----------------------------      ------------------------------         PCT < 0.25 ng/mL                 PCT < 0.1 ng/mL     Stopping of antibiotics            Stopping of antibiotics       strongly encouraged.               strongly encouraged.    ----------------------------     ------------------------------       PCT level decrease by               PCT < 0.25 ng/mL       >= 80% from peak PCT       OR PCT 0.25 - 0.5 ng/mL          Stopping of antibiotics                                             encouraged.     Stopping of antibiotics           encouraged.    ----------------------------     ------------------------------       PCT level decrease by              PCT >= 0.25 ng/mL       < 80% from peak PCT        AND PCT >= 0.5 ng/mL  Continuin g antibiotics                                              encouraged.       Continuing antibiotics            encouraged.    ----------------------------     ------------------------------     PCT level increase compared          PCT > 0.5 ng/mL         with peak PCT AND          PCT >= 0.5 ng/mL             Escalation of antibiotics                                          strongly encouraged.      Escalation of antibiotics        strongly encouraged.   Comprehensive metabolic panel     Status: Abnormal   Collection Time: 06/09/15  5:24 AM  Result Value Ref Range   Sodium 138 135 - 145 mmol/L   Potassium 4.4 3.5 - 5.1 mmol/L   Chloride 107 101 - 111 mmol/L   CO2 24 22 - 32 mmol/L   Glucose, Bld 103 (H) 65 - 99 mg/dL   BUN 16 6 - 20 mg/dL   Creatinine, Ser 1.20 (H) 0.44 - 1.00 mg/dL   Calcium 8.9 8.9 - 10.3 mg/dL   Total Protein 6.3 (L) 6.5 - 8.1 g/dL   Albumin 2.8 (L) 3.5 - 5.0 g/dL   AST 21 15 - 41 U/L   ALT 17 14 - 54 U/L   Alkaline Phosphatase 74 38 - 126 U/L   Total Bilirubin 0.5 0.3 - 1.2 mg/dL   GFR calc non Af Amer 44 (L) >60 mL/min   GFR calc Af Amer 51 (L) >60 mL/min    Comment: (NOTE) The eGFR has been calculated using the CKD EPI  equation. This calculation has not been validated in all clinical situations. eGFR's persistently <60 mL/min signify possible Chronic Kidney Disease.    Anion gap 7 5 - 15     Lipid Panel     Component Value Date/Time   CHOL 217* 02/09/2015 1030   TRIG 156.0* 02/09/2015 1030   HDL 53.40 02/09/2015 1030   CHOLHDL 4 02/09/2015 1030   VLDL 31.2 02/09/2015 1030   LDLCALC 133* 02/09/2015 1030   LDLDIRECT 172.2 08/07/2010 1125     Lab Results  Component Value Date   HGBA1C 6.2 05/31/2015     Lab Results  Component Value Date   LDLCALC 133* 02/09/2015   CREATININE 1.20* 06/09/2015     HPI :y/o female, seen by cardiology in the past with a history of HTN and dyslipidemia. Prior Nuclear stress scans in 2007 and 2011 negative. No history of MI or CAD. She was seen by Dr Ashok Cordia 06/06/15 and had complained of persistent cough as well as atypical chest pain. Troponins ere ordered and have come back abnormal 0.06-0.04. The pt complains of localized Rt sided chest pain. She has had this for > month. It is not exertional, no convincing assocaited symptoms. Myoview was ordered on admission.   HOSPITAL COURSE:   Cardiomyopathy, ischemic -  suggested by Myoview LV Gram EF ~45-50%.  Echo EF: 55% - 60%. Patient switch from Cozaar to irbesartan due to cough,, continue BB & Lasix. With LVEDP ~14 & PCWP 16 - increased PO Lasix to 40 mg.   Will need APP f/u appointment - then would schedule with Dr. Martinique for CTO Consultation & me for continuity.   Coronary artery disease, occlusive: mid RCA CTO with L-R collaterals Good L-R collaterals, but evidence of ischemia on Myoview.  She is not noting anginal pain - but this could explain mild troponin elevation with watershed ischemia.  Continue aspirin 81 mg a day No Plavix as she is on Eliquis Increased Lipitor to 61m   Will have her see Dr. VBeau Fannyor JMartinique(CTO team) to review anatomy & symptoms to determine if she will benefit from CTO  revascularization  Essential hypertension Seems to be controlled on current meds.    Dyslipidemia, goal LDL below 70 More aggressive goal with documented CAD -- increase Lipitor to 470m   Acute renal failure - patient's creatinine is mildly elevated. Patient received IV fluids for cardiac cath. Patient on ARB, Lasix, creatinine 1.2 prior to discharge, follow closely   Seizure disorder-continue Lamictal    Discharge Exam:    Blood pressure 102/61, pulse 75, temperature 98.4 F (36.9 C), temperature source Oral, resp. rate 18, height _0  (1.6 m), weight 89.903 kg (198 lb 3.2 oz), SpO2 99 %.   General appearance: alert, cooperative, appears stated age and no distress Neck: no adenopathy, no carotid bruit, no JVD, supple, symmetrical, trachea midline and thyroid not enlarged, symmetric, no tenderness/mass/nodules Lungs: clear to auscultation bilaterally and normal percussion bilaterally Heart: regular rate and rhythm, S1, S2 normal, no murmur, click, rub or gallop Abdomen: soft, non-tender; bowel sounds normal; no masses, no organomegaly Extremities: edema 1-22+ non-pitting Pulses: 2+ and symmetric   Follow-up Information    Follow up with CRSanda KleinMD.   Specialty:  Cardiology   Why:  office will contact you   Contact information:   [phone]     Follow up with StBinnie RailMD. Schedule an appointment as soon as possible for a visit in 3 days.   Specialty:  Internal Medicine   Contact information:   [address] [place] [phone]     Signed: ABReyne Dumas/12/2015, 11:53 AM        Time spent >45 mins

## 2015-06-09 NOTE — Telephone Encounter (Signed)
Patient had echo but there is no way she had a 6MWT or Full PFT since she was just in hospital and underwent heart catheterization. These tests still need to be done unless you can show me where they are. Thanks.

## 2015-06-09 NOTE — Assessment & Plan Note (Signed)
Showed reduced EF with Inferior Infarct & peri-infarct ischemia -- confirmed by cath today. However EF on LV gram and echocardiogram appeared to be normal. mRCA CTO - with collaterals. This would suggest that the significantly reduced EF on nuclear stress test was based on poor quality study an under estimation of EF with abnormal wall motion.

## 2015-06-09 NOTE — Assessment & Plan Note (Signed)
Good L-R collaterals, but evidence of ischemia on Myoview.   She does have moderate disease in the left coronary system, however no suggestion of ischemia in that distribution. She does not have any significant anginal symptoms, but has been having some dyspnea and cough. Dr. Tamala Julian suggested the possibility of CTO PCI. [name] has looked at the images and felt like she would be a good candidate for revascularization of her CT of the RCA. With that in mind, would consider starting Plavix at 75 mg without aspirin in addition to the Eliquis. Hopefully we can stop Eliquis after one year of into correlation for PE.  We will arrange for her to see a PA/NP in our [place] office, hopefully, [name] is available. Otherwise we would arrange for her to see [name] to discuss CTO with escalation procedure and to see if she would qualify from a symptom standpoint and if she would be interested in the procedure.

## 2015-06-09 NOTE — Assessment & Plan Note (Signed)
We are increasing her Lasix dose. I don't think this is related to heart failure because her right heart Pressures were relatively normal. Probably more related to venous stasis. We can recommend compression stockings as an outpatient.

## 2015-06-09 NOTE — Progress Notes (Signed)
DAILY PROGRESS NOTE  Subjective/Objective Feels relatively OK. No further chest pain. Cough has been stable since she got a "medicine for ". No significant dyspnea. Stable edema  BP 102/61 mmHg  Pulse 75  Temp(Src) 98.4 F (36.9 C) (Oral)  Resp 18  Ht 5\' 3"  (1.6 m)  Wt 198 lb 3.2 oz (89.903 kg)  BMI 35.12 kg/m2  SpO2 99%  General appearance: alert, cooperative, appears stated age and no distress Neck: no adenopathy, no carotid bruit, no JVD, supple, symmetrical, trachea midline and thyroid not enlarged, symmetric, no tenderness/mass/nodules Lungs: clear to auscultation bilaterally and normal percussion bilaterally Heart: regular rate and rhythm, S1, S2 normal, no murmur, click, rub or gallop Abdomen: soft, non-tender; bowel sounds normal; no masses,  no organomegaly Extremities: edema 1-22+ non-pitting Pulses: 2+ and symmetric  Cardiac catheterization: 100% mid RCA occlusion. Proximal LAD roughly 50% at D1 roughly 50%. SP1 70% ostial, OM to 70% mid circumflex 70%. --> Recommendation was to initiate a platelet agent. Consider CTO PCI of RCA.  Echocardiogram: Left ventricle: The cavity size was normal. There was mild  concentric hypertrophy. Systolic function was normal. The  estimated ejection fraction was in the range of 55% to 60%. Wall  motion was normal; there were no regional wall motion  abnormalities. Doppler parameters are consistent with abnormal  left ventricular relaxation (grade 1 diastolic dysfunction).  There was no evidence of elevated ventricular filling pressure by  Doppler parameters. - Mitral valve: Structurally normal valve. There was mild  regurgitation. - Right ventricle: The cavity size was normal. Wall thickness was  normal. Systolic function was normal. - Right atrium: The atrium was normal in size. - Tricuspid valve: There was mild regurgitation. - Pulmonic valve: There was trivial regurgitation. - Pulmonary arteries: Systolic pressure was  within the normal  range. - Inferior vena cava: The vessel was normal in size. - Pericardium, extracardiac: There was no pericardial effusion.    Scheduled Meds: . apixaban  5 mg Oral BID  . aspirin EC  81 mg Oral Daily  . atorvastatin  40 mg Oral Daily  . busPIRone  10 mg Oral TID  . furosemide  40 mg Oral Daily  . irbesartan  150 mg Oral Daily  . lamoTRIgine  100 mg Oral BID  . levofloxacin (LEVAQUIN) IV  750 mg Intravenous Q48H  . montelukast  10 mg Oral QHS  . nortriptyline  25 mg Oral QHS  . pantoprazole  40 mg Oral Daily  . propranolol ER  120 mg Oral Daily  . sodium chloride flush  3 mL Intravenous Q12H   Continuous Infusions:  PRN Meds:sodium chloride, acetaminophen, albuterol, benzonatate, butalbital-acetaminophen-caffeine, morphine injection, nitroGLYCERIN, ondansetron (ZOFRAN) IV, ondansetron (ZOFRAN) IV, oxyCODONE-acetaminophen, sodium chloride flush, zolpidem  Vital signs in last 24 hours: Temp:  [97.5 F (36.4 C)-98.4 F (36.9 C)] 98.4 F (36.9 C) (03/09 0551) Pulse Rate:  [75-107] 75 (03/09 0551) Resp:  [14-20] 18 (03/09 0551) BP: (102-157)/(61-84) 102/61 mmHg (03/09 0551) SpO2:  [95 %-100 %] 99 % (03/09 0551) Weight:  [198 lb 3.2 oz (89.903 kg)] 198 lb 3.2 oz (89.903 kg) (03/09 0551)  Intake/Output last 3 shifts: I/O last 3 completed shifts: In: 1785.6 [P.O.:1320; I.V.:315.6; IV Piggyback:150] Out: 0  Intake/Output this shift: Total I/O In: 220 [P.O.:220] Out: -   Problem Assessment/Plan Cardiovascular and Mediastinum Cardiomyopathy, ischemic - suggested by Myoview Assessment & Plan LV Gram EF ~45-50% - with normal EF of 55-60% byinaccurate read on Myoview.  She is already on ARB,  BB & Lasix. --   With her cough, I would switch her from losartan 100 mg to irbesartan starting at 150 mg  Increased by mouth Lasix to 40 mg due to elevated wedge pressure and EDP. -- Perhaps some of her cough could be related to increased filling pressures and  pulmonary vascular congestion.  Coronary artery disease, occlusive: mid RCA CTO with L-R collaterals Assessment & Plan Good L-R collaterals, but evidence of ischemia on Myoview.   She does have moderate disease in the left coronary system, however no suggestion of ischemia in that distribution. She does not have any significant anginal symptoms, but has been having some dyspnea and cough. Dr. Tamala Julian suggested the possibility of CTO PCI. Dr. Martinique has looked at the images and felt like she would be a good candidate for revascularization of her CT of the RCA. With that in mind, would consider starting Plavix at 75 mg without aspirin in addition to the Eliquis. Hopefully we can stop Eliquis after one year of into correlation for PE.  We will arrange for her to see a PA/NP in our [place] office, hopefully, Shanon Brow Dr. Martinique is available. Otherwise we would arrange for her to see Dr. Martinique to discuss CTO with escalation procedure and to see if she would qualify from a symptom standpoint and if she would be interested in the procedure.  Other Abnormal nuclear stress test Assessment & Plan Showed reduced EF with Inferior Infarct & peri-infarct ischemia -- confirmed by cath today. However EF on LV gram and echocardiogram appeared to be normal. mRCA CTO - with collaterals. This would suggest that the significantly reduced EF on nuclear stress test was based on poor quality study an under estimation of EF with abnormal wall motion.  Bilateral leg edema Assessment & Plan We are increasing her Lasix dose. I don't think this is related to heart failure because her right heart Pressures were relatively normal. Probably more related to venous stasis. We can recommend compression stockings as an outpatient.  Chronic anticoagulation-Eliquis Assessment & Plan She tells me that the plan was for her to be on this indefinitely. I don't know the extent of her pulmonary embolism. We would need to discuss with her  primary provider and review her history to see the extent of her pulmonary emboli. Perhaps we could stop after one year. If not I would consider ELIQUIS plus Plavix if she requires PCI. If we do not go with PCI was able to do ELIQUIS plus low-dose aspirin.   Pain in the chest Assessment & Plan I'm not certain that the pain in her chest was at all related to cardiac ischemia. Very atypical right-sided chest pain. With her coronary disease I would expect her to have exertional chest pain not resting chest pain and it would not be right-sided sharp pain. This is probably related to her cough and musculoskeletal    HARDING, Leonie Green, M.D., M.S. Interventional Cardiologist   Pager # [phone] Phone # [phone] [address] [place], [place] [address]

## 2015-06-09 NOTE — Assessment & Plan Note (Signed)
I'm not certain that the pain in her chest was at all related to cardiac ischemia. Very atypical right-sided chest pain. With her coronary disease I would expect her to have exertional chest pain not resting chest pain and it would not be right-sided sharp pain. This is probably related to her cough and musculoskeletal

## 2015-06-09 NOTE — Care Management Obs Status (Signed)
[facility] NOTIFICATION   Patient Details  Name: Jovanka Remus MRN: [id number] Date of Birth: [birth year]   Medicare Observation Status Notification Given:  Yes    Royston Bake, RN 06/09/2015, 2:44 PM

## 2015-06-09 NOTE — Telephone Encounter (Signed)
Called and spoke to pt. Pt states she has already had an echo, PFT, and 6MWT within the last few days and is questioning what other tests she is scheduled for. Advised pt that she still needs to have her CT chest, VQ scan, and Barium swallow. Pt verbalized understanding and denied any further questions or concerns at this time.    Will send to Dr. Ashok Cordia as FYI d/t 6MWT and PFT not being done the day of upcoming appt with JN.

## 2015-06-09 NOTE — Subjective & Objective (Signed)
Feels relatively OK. No further chest pain. Cough has been stable since she got a "medicine for ". No significant dyspnea. Stable edema  BP 102/61 mmHg  Pulse 75  Temp(Src) 98.4 F (36.9 C) (Oral)  Resp 18  Ht 5\' 3"  (1.6 m)  Wt 198 lb 3.2 oz (89.903 kg)  BMI 35.12 kg/m2  SpO2 99%  General appearance: alert, cooperative, appears stated age and no distress Neck: no adenopathy, no carotid bruit, no JVD, supple, symmetrical, trachea midline and thyroid not enlarged, symmetric, no tenderness/mass/nodules Lungs: clear to auscultation bilaterally and normal percussion bilaterally Heart: regular rate and rhythm, S1, S2 normal, no murmur, click, rub or gallop Abdomen: soft, non-tender; bowel sounds normal; no masses,  no organomegaly Extremities: edema 1-22+ non-pitting Pulses: 2+ and symmetric  Cardiac catheterization: 100% mid RCA occlusion. Proximal LAD roughly 50% at D1 roughly 50%. SP1 70% ostial, OM to 70% mid circumflex 70%. --> Recommendation was to initiate a platelet agent. Consider CTO PCI of RCA.  Echocardiogram: Left ventricle: The cavity size was normal. There was mild  concentric hypertrophy. Systolic function was normal. The  estimated ejection fraction was in the range of 55% to 60%. Wall  motion was normal; there were no regional wall motion  abnormalities. Doppler parameters are consistent with abnormal  left ventricular relaxation (grade 1 diastolic dysfunction).  There was no evidence of elevated ventricular filling pressure by  Doppler parameters. - Mitral valve: Structurally normal valve. There was mild  regurgitation. - Right ventricle: The cavity size was normal. Wall thickness was  normal. Systolic function was normal. - Right atrium: The atrium was normal in size. - Tricuspid valve: There was mild regurgitation. - Pulmonic valve: There was trivial regurgitation. - Pulmonary arteries: Systolic pressure was within the normal  range. - Inferior vena  cava: The vessel was normal in size. - Pericardium, extracardiac: There was no pericardial effusion.

## 2015-06-09 NOTE — Assessment & Plan Note (Signed)
She tells me that the plan was for her to be on this indefinitely. I don't know the extent of her pulmonary embolism. We would need to discuss with her primary provider and review her history to see the extent of her pulmonary emboli. Perhaps we could stop after one year. If not I would consider ELIQUIS plus Plavix if she requires PCI. If we do not go with PCI was able to do ELIQUIS plus low-dose aspirin.

## 2015-06-10 ENCOUNTER — Telehealth: Payer: Self-pay | Admitting: *Deleted

## 2015-06-10 NOTE — Telephone Encounter (Signed)
Transition Care Management Follow-up Telephone Call   Date discharged? 06/09/15   How have you been since you were released from the hospital? *Pt states she is feeling ok   Do you understand why you were in the hospital? YES   Do you understand the discharge instructions? YES   Where were you discharged to? Home   Items Reviewed:  Medications reviewed: YES  Allergies reviewed: YES  Dietary changes reviewed: NO  Referrals reviewed: YES   Functional Questionnaire:   Activities of Daily Living (ADLs):   She states she are independent in the following: ambulation, bathing and hygiene, feeding, continence, grooming, toileting and dressing States she require assistance with the following: ambulation   Any transportation issues/concerns?: NO   Any patient concerns? NO   Confirmed importance and date/time of follow-up visits scheduled YES, appt 06/14/15  Provider Appointment booked with Dr. Quay Burow  Confirmed with patient if condition begins to worsen call PCP or go to the ER.  Patient was given the office number and encouraged to call back with question or concerns.  : YES

## 2015-06-10 NOTE — Telephone Encounter (Signed)
SHOULD PATIENT BE TAKING ELIQUIS  AND ASPIRIN? REVIEW  HOSPITAL NOTE 06/09/15  THE FIRST PST APPOINTMENT WILL BE WITH AN EXTENDER.

## 2015-06-10 NOTE — Telephone Encounter (Signed)
Both  HARDING, Leonie Green, MD

## 2015-06-10 NOTE — Telephone Encounter (Signed)
Forward to Dr. Harding 

## 2015-06-10 NOTE — Telephone Encounter (Signed)
Called and spoke to pt. Informed pt she will still need 6MWT and PFT. Informed pt of the time, date, and location of the tests. Pt verbalized understanding and denied any further questions or concerns at this time.

## 2015-06-10 NOTE — Telephone Encounter (Signed)
Confirmed with patient she is to take her Asp and Eliquis. I let her know it was ok'd with both Dr. Ellyn Hack and Kerin Ransom. She said thank you for the call back and is pleased with getting a response.   Marlis Edelson, CMA

## 2015-06-10 NOTE — Telephone Encounter (Signed)
Patient lvm to get a call from Kerin Ransom wanting to know do she take both her 81mg  aspirin and Eliquis or do she take one or the other. She knows they both blood thinners and wants to make sure she is taking them correct.  Her discharge papers do not state to stop either be she says she was told not to take them together and is now confused on what to do because she takes so many medications.  She would like a call back to discuss the issue. She has to go pick up her glasses at 2pm and will not be available at that time.

## 2015-06-10 NOTE — Telephone Encounter (Signed)
Will forward to [facility] triage. Pt is a Dr. Sallyanne Kuster pt and has a question for [employer], PA-C.

## 2015-06-14 ENCOUNTER — Inpatient Hospital Stay: Payer: Medicare Other | Admitting: Internal Medicine

## 2015-06-21 ENCOUNTER — Emergency Department (HOSPITAL_COMMUNITY): Payer: Medicare Other

## 2015-06-21 ENCOUNTER — Encounter (HOSPITAL_COMMUNITY): Payer: Self-pay | Admitting: Emergency Medicine

## 2015-06-21 ENCOUNTER — Emergency Department (HOSPITAL_COMMUNITY)
Admission: EM | Admit: 2015-06-21 | Discharge: 2015-06-21 | Disposition: A | Discharge status: Home / Self Care | Payer: Medicare Other | Attending: Emergency Medicine | Admitting: Emergency Medicine

## 2015-06-21 ENCOUNTER — Telehealth: Payer: Self-pay | Admitting: Pulmonary Disease

## 2015-06-21 DIAGNOSIS — F419 Anxiety disorder, unspecified: Secondary | ICD-10-CM | POA: Diagnosis not present

## 2015-06-21 DIAGNOSIS — M1712 Unilateral primary osteoarthritis, left knee: Secondary | ICD-10-CM | POA: Insufficient documentation

## 2015-06-21 DIAGNOSIS — Z7901 Long term (current) use of anticoagulants: Secondary | ICD-10-CM | POA: Diagnosis not present

## 2015-06-21 DIAGNOSIS — K219 Gastro-esophageal reflux disease without esophagitis: Secondary | ICD-10-CM | POA: Insufficient documentation

## 2015-06-21 DIAGNOSIS — M8580 Other specified disorders of bone density and structure, unspecified site: Secondary | ICD-10-CM | POA: Insufficient documentation

## 2015-06-21 DIAGNOSIS — R0602 Shortness of breath: Secondary | ICD-10-CM | POA: Diagnosis not present

## 2015-06-21 DIAGNOSIS — Z8673 Personal history of transient ischemic attack (TIA), and cerebral infarction without residual deficits: Secondary | ICD-10-CM | POA: Insufficient documentation

## 2015-06-21 DIAGNOSIS — F329 Major depressive disorder, single episode, unspecified: Secondary | ICD-10-CM | POA: Insufficient documentation

## 2015-06-21 DIAGNOSIS — J45909 Unspecified asthma, uncomplicated: Secondary | ICD-10-CM | POA: Diagnosis not present

## 2015-06-21 DIAGNOSIS — G4700 Insomnia, unspecified: Secondary | ICD-10-CM | POA: Diagnosis not present

## 2015-06-21 DIAGNOSIS — M25512 Pain in left shoulder: Secondary | ICD-10-CM | POA: Insufficient documentation

## 2015-06-21 DIAGNOSIS — Z7982 Long term (current) use of aspirin: Secondary | ICD-10-CM | POA: Insufficient documentation

## 2015-06-21 DIAGNOSIS — I10 Essential (primary) hypertension: Secondary | ICD-10-CM | POA: Insufficient documentation

## 2015-06-21 DIAGNOSIS — Z86711 Personal history of pulmonary embolism: Secondary | ICD-10-CM | POA: Insufficient documentation

## 2015-06-21 DIAGNOSIS — R509 Fever, unspecified: Secondary | ICD-10-CM | POA: Diagnosis not present

## 2015-06-21 DIAGNOSIS — R05 Cough: Secondary | ICD-10-CM | POA: Diagnosis not present

## 2015-06-21 DIAGNOSIS — Z8701 Personal history of pneumonia (recurrent): Secondary | ICD-10-CM | POA: Diagnosis not present

## 2015-06-21 DIAGNOSIS — G43909 Migraine, unspecified, not intractable, without status migrainosus: Secondary | ICD-10-CM | POA: Insufficient documentation

## 2015-06-21 DIAGNOSIS — Z79899 Other long term (current) drug therapy: Secondary | ICD-10-CM | POA: Insufficient documentation

## 2015-06-21 DIAGNOSIS — E785 Hyperlipidemia, unspecified: Secondary | ICD-10-CM | POA: Diagnosis not present

## 2015-06-21 DIAGNOSIS — R059 Cough, unspecified: Secondary | ICD-10-CM

## 2015-06-21 DIAGNOSIS — M25511 Pain in right shoulder: Secondary | ICD-10-CM | POA: Insufficient documentation

## 2015-06-21 LAB — CBC WITH DIFFERENTIAL/PLATELET
BASOS PCT: 0 %
Basophils Absolute: 0 10*3/uL (ref 0.0–0.1)
Eosinophils Absolute: 0.2 10*3/uL (ref 0.0–0.7)
Eosinophils Relative: 2 %
HEMATOCRIT: 41.7 % (ref 36.0–46.0)
HEMOGLOBIN: 13.9 g/dL (ref 12.0–15.0)
LYMPHS PCT: 39 %
Lymphs Abs: 3.1 10*3/uL (ref 0.7–4.0)
MCH: 28.7 pg (ref 26.0–34.0)
MCHC: 33.3 g/dL (ref 30.0–36.0)
MCV: 86.2 fL (ref 78.0–100.0)
MONOS PCT: 7 %
Monocytes Absolute: 0.6 10*3/uL (ref 0.1–1.0)
NEUTROS ABS: 4.2 10*3/uL (ref 1.7–7.7)
NEUTROS PCT: 52 %
Platelets: 234 10*3/uL (ref 150–400)
RBC: 4.84 MIL/uL (ref 3.87–5.11)
RDW: 13.9 % (ref 11.5–15.5)
WBC: 8 10*3/uL (ref 4.0–10.5)

## 2015-06-21 LAB — BASIC METABOLIC PANEL
ANION GAP: 8 (ref 5–15)
BUN: 20 mg/dL (ref 6–20)
CHLORIDE: 103 mmol/L (ref 101–111)
CO2: 27 mmol/L (ref 22–32)
CREATININE: 1.34 mg/dL — AB (ref 0.44–1.00)
Calcium: 9.6 mg/dL (ref 8.9–10.3)
GFR calc non Af Amer: 38 mL/min — ABNORMAL LOW (ref >60)
GFR, EST AFRICAN AMERICAN: 44 mL/min — AB (ref >60)
Glucose, Bld: 106 mg/dL — ABNORMAL HIGH (ref 65–99)
Potassium: 4.4 mmol/L (ref 3.5–5.1)
Sodium: 138 mmol/L (ref 135–145)

## 2015-06-21 LAB — BRAIN NATRIURETIC PEPTIDE: B Natriuretic Peptide: 24.8 pg/mL (ref 0.0–100.0)

## 2015-06-21 LAB — I-STAT TROPONIN, ED
Troponin i, poc: 0 ng/mL (ref 0.00–0.08)
Troponin i, poc: 0 ng/mL (ref 0.00–0.08)

## 2015-06-21 MED ORDER — ASPIRIN 81 MG PO CHEW
324.0000 mg | CHEWABLE_TABLET | Freq: Once | ORAL | Status: AC
  Administered 2015-06-21: 324 mg via ORAL
  Filled 2015-06-21: qty 4

## 2015-06-21 NOTE — Telephone Encounter (Signed)
Late Add:  Patients vital signs at time of assessment 11:10am.  BP: 114/64, Pulse: 101; O2: 100% RA Patient left prior to provider assessment. Patient went directly to ED verified on Epic.

## 2015-06-21 NOTE — ED Notes (Signed)
Pt made aware that we are waiting to run an 8pm troponin.  Verbalized understanding.

## 2015-06-21 NOTE — ED Notes (Signed)
Patient states she has been "feeling bad for 3-4 days." - believes it was the flu. Reports body aches for 3-4 days and a non-productive cough since November. Denies sore throat, fever, nausea, vomiting, diarrhea.

## 2015-06-21 NOTE — ED Notes (Signed)
Pt currently denying complaints.  Sts "I was feeling bad earlier and the doctor would not see me."  Further, Pt reports that she doesn't feel that she needs further cardiac testing, "because they did everything in the hospital."

## 2015-06-21 NOTE — Telephone Encounter (Addendum)
Patient came into office demanding to be seen right away because she is having chest pain. She was offered an appointment at [facility] with Judson Roch but refused saying that she needs to be seen now. Brought patient back to a room and checked her vitals and got more history from patient.  She said that the pain she is having is in the joints of her arms on both sides, she said that she has SOB, cough.  Patient had been hospitalized for heart problems at the beginning of this month.  Spoke with Dr. Annamaria Boots and Rexene Edison, NP, they both agreed that patient needed to go via EMS to ER.  Advised patient that we are going to send her to ER, she refused EMS and said that she has someone with her that can drive her across the street to ER.  Called [facility] and spoke with Camera operator, advised her that patient is on her way and that she refused EMS.  Nothing further needed.

## 2015-06-21 NOTE — Discharge Instructions (Signed)
You may have a virus causing your cough. Your CXR does not show pneumonia. Please follow-up with her cardiologist tomorrow as scheduled, and continue all lung testing and heart testing this week as scheduled. Return without fail for worsening symptoms including worsening pain, difficulty breathing, passing out, or any other symptoms concerning to you.  Cough, Adult Coughing is a reflex that clears your throat and your airways. Coughing helps to heal and protect your lungs. It is normal to cough occasionally, but a cough that happens with other symptoms or lasts a long time may be a sign of a condition that needs treatment. A cough may last only 2-3 weeks (acute), or it may last longer than 8 weeks (chronic). CAUSES Coughing is commonly caused by:  Breathing in substances that irritate your lungs.  A viral or bacterial respiratory infection.  Allergies.  Asthma.  Postnasal drip.  Smoking.  Acid backing up from the stomach into the esophagus (gastroesophageal reflux).  Certain medicines.  Chronic lung problems, including COPD (or rarely, lung cancer).  Other medical conditions such as heart failure. HOME CARE INSTRUCTIONS  Pay attention to any changes in your symptoms. Take these actions to help with your discomfort:  Take medicines only as told by your health care provider.  If you were prescribed an antibiotic medicine, take it as told by your health care provider. Do not stop taking the antibiotic even if you start to feel better.  Talk with your health care provider before you take a cough suppressant medicine.  Drink enough fluid to keep your urine clear or pale yellow.  If the air is dry, use a cold steam vaporizer or humidifier in your bedroom or your home to help loosen secretions.  Avoid anything that causes you to cough at work or at home.  If your cough is worse at night, try sleeping in a semi-upright position.  Avoid cigarette smoke. If you smoke, quit smoking.  If you need help quitting, ask your health care provider.  Avoid caffeine.  Avoid alcohol.  Rest as needed. SEEK MEDICAL CARE IF:   You have new symptoms.  You cough up pus.  Your cough does not get better after 2-3 weeks, or your cough gets worse.  You cannot control your cough with suppressant medicines and you are losing sleep.  You develop pain that is getting worse or pain that is not controlled with pain medicines.  You have a fever.  You have unexplained weight loss.  You have night sweats. SEEK IMMEDIATE MEDICAL CARE IF:  You cough up blood.  You have difficulty breathing.  Your heartbeat is very fast.   This information is not intended to replace advice given to you by your health care provider. Make sure you discuss any questions you have with your health care provider.   Document Released: 09/15/2010 Document Revised: 12/08/2014 Document Reviewed: 05/26/2014 Elsevier Interactive Patient Education [employer].

## 2015-06-21 NOTE — ED Notes (Signed)
MD at bedside. 
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
## 2015-06-21 NOTE — ED Notes (Signed)
Per Liu MD, Pt c/o generalized chest and upper back pain.  Pt was recently admitted for an elevated troponin.    Per chart review, Pt was at [place] office earlier today and demanding to be seen.  Pt was offered an appointment around 3p, but stated that she could not wait.  Pt directed to ED and refused EMS transport.

## 2015-06-22 ENCOUNTER — Ambulatory Visit (INDEPENDENT_AMBULATORY_CARE_PROVIDER_SITE_OTHER)
Admission: RE | Admit: 2015-06-22 | Discharge: 2015-06-22 | Disposition: A | Discharge status: Home / Self Care | Payer: Medicare Other | Source: Ambulatory Visit | Attending: Pulmonary Disease | Admitting: Pulmonary Disease

## 2015-06-22 ENCOUNTER — Ambulatory Visit (HOSPITAL_COMMUNITY): Payer: Medicare Other

## 2015-06-22 DIAGNOSIS — R918 Other nonspecific abnormal finding of lung field: Secondary | ICD-10-CM

## 2015-06-22 MED ORDER — DEXAMETHASONE SODIUM PHOSPHATE 10 MG/ML IJ SOLN
INTRAMUSCULAR | Status: AC
Start: 2015-06-22 — End: 2015-06-22
  Filled 2015-06-22: qty 1

## 2015-06-22 MED ORDER — ONDANSETRON HCL 4 MG/2ML IJ SOLN
INTRAMUSCULAR | Status: AC
  Filled 2015-06-22: qty 4

## 2015-06-22 MED ORDER — MIDAZOLAM HCL 2 MG/2ML IJ SOLN
INTRAMUSCULAR | Status: AC
  Filled 2015-06-22: qty 2

## 2015-06-22 MED ORDER — FENTANYL CITRATE (PF) 250 MCG/5ML IJ SOLN
INTRAMUSCULAR | Status: AC
  Filled 2015-06-22: qty 5

## 2015-06-22 MED ORDER — ROCURONIUM BROMIDE 100 MG/10ML IV SOLN
INTRAVENOUS | Status: AC
  Filled 2015-06-22: qty 1

## 2015-06-22 MED ORDER — PROPOFOL 10 MG/ML IV BOLUS
INTRAVENOUS | Status: AC
  Filled 2015-06-22: qty 20

## 2015-06-22 MED ORDER — LIDOCAINE HCL (CARDIAC) 20 MG/ML IV SOLN
INTRAVENOUS | Status: AC
  Filled 2015-06-22: qty 5

## 2015-06-23 ENCOUNTER — Other Ambulatory Visit: Payer: Self-pay | Admitting: Internal Medicine

## 2015-06-23 ENCOUNTER — Ambulatory Visit (HOSPITAL_COMMUNITY)
Admission: RE | Admit: 2015-06-23 | Discharge: 2015-06-23 | Disposition: A | Discharge status: Home / Self Care | Payer: Medicare Other | Source: Ambulatory Visit | Attending: Pulmonary Disease | Admitting: Pulmonary Disease

## 2015-06-23 ENCOUNTER — Encounter (HOSPITAL_COMMUNITY)
Admission: RE | Admit: 2015-06-23 | Discharge: 2015-06-23 | Disposition: A | Discharge status: Home / Self Care | Payer: Medicare Other | Source: Ambulatory Visit | Attending: Pulmonary Disease | Admitting: Pulmonary Disease

## 2015-06-23 ENCOUNTER — Other Ambulatory Visit: Payer: Self-pay | Admitting: Neurology

## 2015-06-23 ENCOUNTER — Telehealth: Payer: Self-pay | Admitting: *Deleted

## 2015-06-23 ENCOUNTER — Telehealth: Payer: Self-pay | Admitting: Pulmonary Disease

## 2015-06-23 DIAGNOSIS — I749 Embolism and thrombosis of unspecified artery: Secondary | ICD-10-CM

## 2015-06-23 DIAGNOSIS — R079 Chest pain, unspecified: Secondary | ICD-10-CM | POA: Diagnosis not present

## 2015-06-23 DIAGNOSIS — R0600 Dyspnea, unspecified: Secondary | ICD-10-CM | POA: Insufficient documentation

## 2015-06-23 DIAGNOSIS — K219 Gastro-esophageal reflux disease without esophagitis: Secondary | ICD-10-CM

## 2015-06-23 DIAGNOSIS — R05 Cough: Secondary | ICD-10-CM | POA: Diagnosis not present

## 2015-06-23 DIAGNOSIS — I2699 Other pulmonary embolism without acute cor pulmonale: Secondary | ICD-10-CM | POA: Diagnosis not present

## 2015-06-23 DIAGNOSIS — R0602 Shortness of breath: Secondary | ICD-10-CM | POA: Diagnosis not present

## 2015-06-23 MED ORDER — TECHNETIUM TC 99M DIETHYLENETRIAME-PENTAACETIC ACID: 28.6000 | Freq: Once | INTRAVENOUS | Status: DC | PRN

## 2015-06-23 MED ORDER — TECHNETIUM TO 99M ALBUMIN AGGREGATED
4.1000 | Freq: Once | INTRAVENOUS | Status: AC | PRN
  Administered 2015-06-23: 4 via INTRAVENOUS

## 2015-06-23 NOTE — Telephone Encounter (Signed)
Pt VQ scan came back positive for PE.

## 2015-06-23 NOTE — Telephone Encounter (Signed)
Spoke with Dr. Ashok Cordia. He has already called and spoke with pt personally regarding results.  I have placed referral to hematology. Nothing further needed

## 2015-06-23 NOTE — Telephone Encounter (Signed)
Patient contacted and relayed the results of her VQ scan, CT scan, serum lab tests, echocardiogram, and barium swallow. Currently on systemic anticoagulation with Eliquis. Hemoglobin normal. Renal function marginal with creatinine clearance approximately 44. Patient has no signs of active bleeding at this time. Referring to hematology for further evaluation given history of a DVT and her daughter that seemingly was unprovoked.

## 2015-06-24 ENCOUNTER — Ambulatory Visit (INDEPENDENT_AMBULATORY_CARE_PROVIDER_SITE_OTHER): Payer: Medicare Other | Admitting: Cardiology

## 2015-06-24 ENCOUNTER — Encounter: Payer: Self-pay | Admitting: Cardiology

## 2015-06-24 VITALS — BP 122/72 | HR 84 | Ht 63.0 in | Wt 190.3 lb

## 2015-06-24 DIAGNOSIS — I2510 Atherosclerotic heart disease of native coronary artery without angina pectoris: Secondary | ICD-10-CM | POA: Diagnosis not present

## 2015-06-24 DIAGNOSIS — R7989 Other specified abnormal findings of blood chemistry: Secondary | ICD-10-CM

## 2015-06-24 DIAGNOSIS — Z86711 Personal history of pulmonary embolism: Secondary | ICD-10-CM | POA: Diagnosis not present

## 2015-06-24 DIAGNOSIS — R778 Other specified abnormalities of plasma proteins: Secondary | ICD-10-CM

## 2015-06-24 DIAGNOSIS — Z7901 Long term (current) use of anticoagulants: Secondary | ICD-10-CM | POA: Diagnosis not present

## 2015-06-24 NOTE — Telephone Encounter (Signed)
Called and spoke to pharmacist. They advised that last rx they received for this medication was 11/2014. Pt needs refills. Advised I will check to make sure ok to refill first.He verbalized understanding.

## 2015-06-24 NOTE — Assessment & Plan Note (Signed)
Medical Rx, doubt this is causing her chest pain

## 2015-06-24 NOTE — Progress Notes (Signed)
06/24/2015 [name]   1940-08-20  [id number]  Primary Physician Binnie Rail, MD Primary Cardiologist: Dr Ellyn Hack  HPI:  75 y/o female, seen by cardiology in the past with a history of HTN and dyslipidemia. Prior Nuclear stress scans in 2007 and 2011 were negative. No history of MI or CAD. She was seen by Dr Ashok Cordia on 06/06/15 and had complained of persistent cough as well as atypical chest pain. Troponins were ordered and came back abnormal 0.06-0.04. She was admitted for further evaluation. Myoview was abnormal with LVD with inferior scar. It was decided to proceed to cath. This was done by Dr Ellyn Hack 06/08/15 and revealed a CTO of her RCA with L-R collaterals. She had a 50% LAD, 70% OM1, and 70% OM2. The plan is for medical Rx. Interestingly, her echo showed normal LVF with an EF of 55-60% (no WMA). She is in the office today for follow up. She had a VQ yesterday which showed a high probability for PE. Dr Ashok Cordia has referred her to a Hematologist. She tells me she has been compliant with her Eliquis but I'm not 100% clear on what other medication she takes at home. She denies any anginal symptoms.    Current Outpatient Prescriptions  Medication Sig Dispense Refill  . apixaban (ELIQUIS) 5 MG TABS tablet Take 1 tablet (5 mg total) by mouth 2 (two) times daily. 180 tablet 3  . aspirin 81 MG EC tablet Take 1 tablet (81 mg total) by mouth daily. 30 tablet 0  . atorvastatin (LIPITOR) 40 MG tablet Take 1 tablet (40 mg total) by mouth daily. 30 tablet 1  . benzonatate (TESSALON) 100 MG capsule Take 1-2 capsules (100-200 mg total) by mouth 3 (three) times daily as needed for cough. 90 capsule 1  . busPIRone (BUSPAR) 10 MG tablet TAKE 1 TABLET BY MOUTH 3 TIMES A DAY 360 tablet 2  . CALCIUM 500/D 500-400 MG-UNIT tablet CHEW TWO TABLETS BY MOUTH THREE TIMES DAILY. 180 tablet 1  . furosemide (LASIX) 40 MG tablet Take 1 tablet (40 mg total) by mouth daily. 30 tablet 1  . irbesartan (AVAPRO) 150  MG tablet Take 1 tablet (150 mg total) by mouth daily. 30 tablet 0  . LamoTRIgine (LAMICTAL XR) 100 MG TB24 Take 2 tablets (200 mg total) by mouth at bedtime. 60 tablet 6  . levofloxacin (LEVAQUIN) 500 MG tablet Take 1 tablet (500 mg total) by mouth daily. 2 tablet 0  . montelukast (SINGULAIR) 10 MG tablet Take 1 tablet (10 mg total) by mouth at bedtime. 30 tablet 3  . nortriptyline (PAMELOR) 25 MG capsule TAKE 1 CAPSULE BY MOUTH EVERY NIGHT AT BEDTIME 90 capsule 1  . omeprazole (PRILOSEC) 40 MG capsule Take 20 mg by mouth daily.    . pantoprazole (PROTONIX) 40 MG tablet Take 1 tablet (40 mg total) by mouth daily. 30 tablet 1  . propranolol ER (INDERAL LA) 120 MG 24 hr capsule Take 1 capsule (120 mg total) by mouth daily. 90 capsule 3  . triamcinolone cream (KENALOG) 0.5 % apply to affected area three times a day as needed for rash/itching  0  . valACYclovir (VALTREX) 1000 MG tablet TAKE 1 TABLET BY MOUTH ONCE DAILY AS NEEDED FOR OUTBREAKS 90 tablet 0  . VENTOLIN HFA 108 (90 Base) MCG/ACT inhaler INHALE 2 PUFFS BY MOUTH EVERY 6 HOURS AS NEEDED FOR WHEEZING 54 each 1  . zolpidem (AMBIEN) 5 MG tablet Take 1 tablet (5 mg total) by mouth at  bedtime as needed for sleep. Not to be taken chronically. 30 tablet 5   No current facility-administered medications for this visit.   Facility-Administered Medications Ordered in Other Visits  Medication Dose Route Frequency Provider Last Rate Last Dose  . technetium TC 27M diethylenetriame-pentaacetic acid (DTPA) injection 28.6 milli Curie  28.6 milli Curie Intravenous Once PRN Javier Glazier, MD        Allergies  Allergen Reactions  . Pollen Extract Other (See Comments)    Seasonal allergies    Social History   Social History  . Marital Status: Divorced    Spouse Name: N/A  . Number of Children: 3  . Years of Education: 14   Occupational History  . service area     Retired/Disabled   Social History Main Topics  . Smoking status: Passive  Smoke Exposure - Never Smoker  . Smokeless tobacco: Never Used     Comment: From Father.  . Alcohol Use: 0.0 oz/week    0 Standard drinks or equivalent per week     Comment: rarely wine  . Drug Use: No  . Sexual Activity: Not on file   Other Topics Concern  . Not on file   Social History Narrative   Patient lives at home alone. Patient is retired/Disabled.   Education two years of college.   Right handed.   Caffeine - None      [facility]:   Originally from [place]. Previously lived in [place], [place]. Previous travel to [place], [place]. 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 She also worked at [employer]. No pets currently. No bird exposure. No indoor plants. No mold exposure. Enjoys reading.      Review of Systems: General: negative for chills, fever, night sweats or weight changes.  Cardiovascular: negative for chest pain, dyspnea on exertion, edema, orthopnea, palpitations, paroxysmal nocturnal dyspnea or shortness of breath Dermatological: negative for rash Respiratory: negative for cough or wheezing Urologic: negative for hematuria Abdominal: negative for nausea, vomiting, diarrhea, bright red blood per rectum, melena, or hematemesis Neurologic: negative for visual changes, syncope, or dizziness All other systems reviewed and are otherwise negative except as noted above.    Blood pressure 122/72, pulse 84, height 5\' 3"  (1.6 m), weight 190 lb 4.8 oz (86.32 kg).  General appearance: alert, cooperative and no distress Neck: no carotid bruit and no JVD Lungs: clear to auscultation bilaterally Heart: regular rate and rhythm Abdomen: obese, non tender Extremities: 1+ edema Skin: Skin color, texture, turgor normal. No rashes or lesions Neurologic: Grossly normal  EKG NSR  ASSESSMENT AND PLAN:   Elevated troponin Sent to the hospital 06/07/15 by Dr Ashok Cordia after the pts Troponin came back elevated  Coronary artery disease, occlusive: mid RCA  CTO with L-R collaterals Medical Rx, doubt this is causing her chest pain  History of pulmonary embolus (PE)-May 2016 Recent VQ continues to show PE  Chronic anticoagulation-Eliquis She is to see a hematologist for further evluation   PLAN  From a cardiac standpoint she is stable. Continue statin Rx. I have asked her to come back Monday with medications from home so we can sort that out.   Recurrent PE on Eliquis is concerning, will defer to Dr Ashok Cordia and Hematology. F/U Dr Ellyn Hack in 3  Months.  Erlene Quan PA-C 06/24/2015 4:39 PM

## 2015-06-24 NOTE — Assessment & Plan Note (Signed)
Sent to the hospital 06/07/15 by Dr Ashok Cordia after the pts Troponin came back elevated

## 2015-06-24 NOTE — Assessment & Plan Note (Signed)
She is to see a hematologist for further evluation

## 2015-06-24 NOTE — Assessment & Plan Note (Signed)
Recent VQ continues to show PE

## 2015-06-24 NOTE — Patient Instructions (Signed)
Schedule appointment with our Pharmacist Erasmo Downer to review medications.Bring all medications in a bag to appointment.    Your physician recommends that you schedule a follow-up appointment in: 3 months with Dr.Harding.

## 2015-06-27 ENCOUNTER — Ambulatory Visit (INDEPENDENT_AMBULATORY_CARE_PROVIDER_SITE_OTHER): Payer: Medicare Other | Admitting: Pharmacist Clinician (PhC)/ Clinical Pharmacy Specialist

## 2015-06-27 ENCOUNTER — Encounter: Payer: Self-pay | Admitting: Pharmacist Clinician (PhC)/ Clinical Pharmacy Specialist

## 2015-06-27 VITALS — BP 112/68

## 2015-06-27 DIAGNOSIS — Z7901 Long term (current) use of anticoagulants: Secondary | ICD-10-CM | POA: Diagnosis not present

## 2015-06-27 DIAGNOSIS — R600 Localized edema: Secondary | ICD-10-CM | POA: Diagnosis not present

## 2015-06-27 NOTE — Patient Instructions (Signed)
Stop the pantoprazole for 2 weeks.  It's for heartburn.  If you have no heartburn or stomach problems, then stay off.  If you develop heartburn or your cough gets worse, restart.  Take furosemide 40 mg - 2 tablets for 2 days - then go back to 1 tablet daily.  This should help decrease the fluid in your legs.  If it does not, call Dr. Allison Quarry nurse [phone]

## 2015-06-27 NOTE — Assessment & Plan Note (Signed)
Complains again today of the edema.  Advised she take furosemide 40 mg 2 tablets x 2 days then resume with once daily.  Call to Dr. Allison Quarry RN if no improvement.

## 2015-06-27 NOTE — Assessment & Plan Note (Signed)
Patient medication list updated.  Of note on many medications she brings in show last refill date matching to what she would be expected to have on hand.  However her Eliquis bottle is dated Jan 30 and has about 5-7 days of medication.  She states that she just dumps new bottle into old.  However on 2-3 other medications, she actually brings in 2 bottles.  I cannot determine whether she is compliant with the Eliquis or not.    In reviewing all of her medications, patient states no problems with heartburn in past several years.  Advised she stop the pantoprazole and see if she feels any new developing symptoms.  If not she may d/c this medication.  Advised that if she feels heartburn or an increase in her cough, she should resume.   All other medications to remain the same.
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
## 2015-06-28 ENCOUNTER — Telehealth: Payer: Self-pay | Admitting: *Deleted

## 2015-06-28 ENCOUNTER — Ambulatory Visit (HOSPITAL_COMMUNITY)
Admission: RE | Admit: 2015-06-28 | Discharge: 2015-06-28 | Disposition: A | Discharge status: Home / Self Care | Payer: Medicare Other | Source: Ambulatory Visit | Attending: Pulmonary Disease | Admitting: Pulmonary Disease

## 2015-06-28 ENCOUNTER — Encounter: Payer: Self-pay | Admitting: *Deleted

## 2015-06-28 DIAGNOSIS — R05 Cough: Secondary | ICD-10-CM | POA: Diagnosis not present

## 2015-06-28 DIAGNOSIS — R059 Cough, unspecified: Secondary | ICD-10-CM

## 2015-06-28 LAB — PULMONARY FUNCTION TEST
DL/VA % pred: 83 %
DL/VA: 3.84 ml/min/mmHg/L
DLCO COR % PRED: 46 %
DLCO cor: 10.3 ml/min/mmHg
DLCO unc % pred: 47 %
DLCO unc: 10.46 ml/min/mmHg
FEF 25-75 Post: 1.52 L/sec
FEF 25-75 Pre: 2.43 L/sec
FEF2575-%CHANGE-POST: -37 %
FEF2575-%Pred-Post: 105 %
FEF2575-%Pred-Pre: 168 %
FEV1-%CHANGE-POST: -7 %
FEV1-%Pred-Post: 100 %
FEV1-%Pred-Pre: 108 %
FEV1-POST: 1.61 L
FEV1-Pre: 1.73 L
FEV1FVC-%CHANGE-POST: 2 %
FEV1FVC-%Pred-Pre: 110 %
FEV6-%Change-Post: -9 %
FEV6-%PRED-POST: 93 %
FEV6-%Pred-Pre: 102 %
FEV6-PRE: 2.05 L
FEV6-Post: 1.86 L
FEV6FVC-%PRED-PRE: 104 %
FEV6FVC-%Pred-Post: 104 %
FVC-%CHANGE-POST: -9 %
FVC-%PRED-POST: 89 %
FVC-%Pred-Pre: 98 %
FVC-Post: 1.86 L
FVC-Pre: 2.05 L
POST FEV1/FVC RATIO: 87 %
PRE FEV6/FVC RATIO: 100 %
Post FEV6/FVC ratio: 100 %
Pre FEV1/FVC ratio: 85 %
RV % PRED: 80 %
RV: 1.76 L
TLC % pred: 83 %
TLC: 4.01 L

## 2015-06-28 MED ORDER — ALBUTEROL SULFATE (2.5 MG/3ML) 0.083% IN NEBU
2.5000 mg | INHALATION_SOLUTION | Freq: Once | RESPIRATORY_TRACT | Status: AC
  Administered 2015-06-28: 2.5 mg via RESPIRATORY_TRACT

## 2015-06-28 NOTE — Telephone Encounter (Signed)
Error

## 2015-06-29 ENCOUNTER — Encounter: Payer: Self-pay | Admitting: Hematology and Oncology

## 2015-06-29 ENCOUNTER — Telehealth: Payer: Self-pay | Admitting: Hematology and Oncology

## 2015-06-29 NOTE — Telephone Encounter (Signed)
Mailed np packet Faxed np letter to referring office to contact pt with appt. Intake Referring Dr. Ashok Cordia

## 2015-06-30 ENCOUNTER — Ambulatory Visit (INDEPENDENT_AMBULATORY_CARE_PROVIDER_SITE_OTHER): Payer: Medicare Other | Admitting: Pulmonary Disease

## 2015-06-30 ENCOUNTER — Telehealth: Payer: Self-pay | Admitting: Pulmonary Disease

## 2015-06-30 ENCOUNTER — Encounter: Payer: Self-pay | Admitting: Pulmonary Disease

## 2015-06-30 VITALS — BP 116/66 | HR 82 | Ht 63.0 in | Wt 201.8 lb

## 2015-06-30 DIAGNOSIS — R0600 Dyspnea, unspecified: Secondary | ICD-10-CM

## 2015-06-30 DIAGNOSIS — R059 Cough, unspecified: Secondary | ICD-10-CM

## 2015-06-30 DIAGNOSIS — I2782 Chronic pulmonary embolism: Secondary | ICD-10-CM

## 2015-06-30 DIAGNOSIS — R05 Cough: Secondary | ICD-10-CM | POA: Diagnosis not present

## 2015-06-30 DIAGNOSIS — R918 Other nonspecific abnormal finding of lung field: Secondary | ICD-10-CM | POA: Diagnosis not present

## 2015-06-30 MED ORDER — FLUTICASONE PROPIONATE 50 MCG/ACT NA SUSP: 1.0000 | Freq: Two times a day (BID) | NASAL | Status: DC

## 2015-06-30 NOTE — Patient Instructions (Signed)
   Continue using her Singulair as prescribed  I'm starting you on Flonase to help with your allergies which should also help her cough  Don't forget about your appointment with Dr. Alvy Bimler in April  I will see you back in 1-2 months but please call me if you have any new problems or questions.  TESTS ORDERED: 1. Maxillofacial CT without contrast

## 2015-06-30 NOTE — Telephone Encounter (Signed)
PFT 06/28/15: FVC 2.05 L (98%) FEV1 1.73 L (180%) FEV1/FVC 0.5 FEF 25-75 2.43 L (168%) no bronchodilator response TLC 4.01 L (83%) RV 80% DLCO uncorrected 46% (hemoglobin 13.9 but unable to perform DLCO correctly)  IMAGING Barium Swallow 06/23/15 (per radiologist): No reflux or mucosal abnormality appreciated.  VQ Scan 06/23/15 (per radiologist): High probability pulmonary embolus with bilateral wedge-shaped perfusion mismatches.  CXR PA/LAT 06/23/15 (personally reviewed by me): No nodule or opacity appreciated. No pleural effusion appreciated. Heart normal in size. Mediastinum normal in contour.  CT Chest W/O 06/22/15 (personally reviewed by me): Subcentimeter parenchymal nodules with largest measuring 5 mm left lower lobe. Nodules do not appear to be progressing and if anything may be regressing compared with prior CT imaging. No opacity appreciated. No pleural effusion or thickening. No pericardial effusion. No pathologic mediastinal adenopathy.  LABS 06/06/15 ANA:  Negative Anti-Jo1:  <0.2 Anti-Centromere Ab:  <0.2 Anti-CCP:  <16 RF:  <10 DS DNA Ab:  1 Smith Ab:  <0.2 RNP Ab:  <0.2 SSA:  <0.2 SSB:  <0.2 SCL-70:  <0.2 Anti-Chromatin Ab:  <0.2

## 2015-06-30 NOTE — Progress Notes (Signed)
Result reviewed. 
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
## 2015-07-06 DIAGNOSIS — I2510 Atherosclerotic heart disease of native coronary artery without angina pectoris: Secondary | ICD-10-CM | POA: Diagnosis not present

## 2015-07-06 DIAGNOSIS — I252 Old myocardial infarction: Secondary | ICD-10-CM | POA: Diagnosis not present

## 2015-07-06 DIAGNOSIS — I2699 Other pulmonary embolism without acute cor pulmonale: Secondary | ICD-10-CM | POA: Diagnosis not present

## 2015-07-06 DIAGNOSIS — I10 Essential (primary) hypertension: Secondary | ICD-10-CM | POA: Diagnosis not present

## 2015-07-06 DIAGNOSIS — J45909 Unspecified asthma, uncomplicated: Secondary | ICD-10-CM | POA: Diagnosis not present

## 2015-07-11 ENCOUNTER — Ambulatory Visit (INDEPENDENT_AMBULATORY_CARE_PROVIDER_SITE_OTHER)
Admission: RE | Admit: 2015-07-11 | Discharge: 2015-07-11 | Disposition: A | Discharge status: Home / Self Care | Payer: Medicare Other | Source: Ambulatory Visit | Attending: Pulmonary Disease | Admitting: Pulmonary Disease

## 2015-07-11 DIAGNOSIS — R05 Cough: Secondary | ICD-10-CM | POA: Diagnosis not present

## 2015-07-11 DIAGNOSIS — R059 Cough, unspecified: Secondary | ICD-10-CM

## 2015-07-15 ENCOUNTER — Encounter: Payer: Self-pay | Admitting: Hematology and Oncology

## 2015-07-15 ENCOUNTER — Ambulatory Visit (HOSPITAL_BASED_OUTPATIENT_CLINIC_OR_DEPARTMENT_OTHER): Payer: Medicare Other | Admitting: Hematology and Oncology

## 2015-07-15 VITALS — BP 118/64 | HR 80 | Temp 98.1°F | Resp 18 | Ht 63.0 in | Wt 207.5 lb

## 2015-07-15 DIAGNOSIS — Z8042 Family history of malignant neoplasm of prostate: Secondary | ICD-10-CM | POA: Diagnosis not present

## 2015-07-15 DIAGNOSIS — Z8371 Family history of colonic polyps: Secondary | ICD-10-CM | POA: Diagnosis not present

## 2015-07-15 DIAGNOSIS — Z86711 Personal history of pulmonary embolism: Secondary | ICD-10-CM

## 2015-07-15 DIAGNOSIS — Z7901 Long term (current) use of anticoagulants: Secondary | ICD-10-CM | POA: Diagnosis not present
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
## 2015-07-19 NOTE — Telephone Encounter (Deleted)
na

## 2015-07-22 ENCOUNTER — Other Ambulatory Visit: Payer: Self-pay | Admitting: Internal Medicine

## 2015-07-27 ENCOUNTER — Other Ambulatory Visit: Payer: Self-pay | Admitting: Pulmonary Disease

## 2015-07-27 ENCOUNTER — Other Ambulatory Visit: Payer: Self-pay | Admitting: Internal Medicine

## 2015-07-28 NOTE — Telephone Encounter (Signed)
Spoke with pt. Handicap form mailed to pt per request.

## 2015-08-16 ENCOUNTER — Ambulatory Visit: Payer: Medicare HMO | Admitting: Nurse Practitioner

## 2015-08-17 ENCOUNTER — Encounter: Payer: Self-pay | Admitting: Nurse Practitioner

## 2015-08-18 ENCOUNTER — Telehealth: Payer: Self-pay

## 2015-08-18 NOTE — Telephone Encounter (Signed)
PA initiated via CoverMyMeds key [id number]

## 2015-08-19 NOTE — Telephone Encounter (Signed)
Pt's plan only allows for #90 tablets in a 365 day period. Preferred medications are Trazodone and Rozerem, please advise. Thanks!

## 2015-08-19 NOTE — Telephone Encounter (Signed)
Pt agreed that she rarely takes medication and currently has enough to last for a few months.

## 2015-08-19 NOTE — Telephone Encounter (Signed)
i think she takes [place] infrequently so she may be ok with the quantity limitation - if not ask her if she would like to try something different

## 2015-08-25 ENCOUNTER — Other Ambulatory Visit: Payer: Self-pay | Admitting: Pulmonary Disease

## 2015-08-25 ENCOUNTER — Other Ambulatory Visit: Payer: Self-pay | Admitting: Internal Medicine

## 2015-08-31 DIAGNOSIS — I2510 Atherosclerotic heart disease of native coronary artery without angina pectoris: Secondary | ICD-10-CM | POA: Diagnosis not present

## 2015-08-31 DIAGNOSIS — I10 Essential (primary) hypertension: Secondary | ICD-10-CM | POA: Diagnosis not present

## 2015-08-31 DIAGNOSIS — I252 Old myocardial infarction: Secondary | ICD-10-CM | POA: Diagnosis not present

## 2015-08-31 DIAGNOSIS — E785 Hyperlipidemia, unspecified: Secondary | ICD-10-CM | POA: Diagnosis not present

## 2015-09-05 ENCOUNTER — Encounter: Payer: Self-pay | Admitting: Nurse Practitioner

## 2015-09-05 ENCOUNTER — Ambulatory Visit: Payer: Medicare Other | Admitting: Pulmonary Disease

## 2015-09-05 ENCOUNTER — Ambulatory Visit (INDEPENDENT_AMBULATORY_CARE_PROVIDER_SITE_OTHER): Payer: Medicare Other | Admitting: Nurse Practitioner

## 2015-09-05 VITALS — BP 125/75 | HR 89 | Resp 16 | Ht 63.0 in | Wt 200.0 lb

## 2015-09-05 DIAGNOSIS — R569 Unspecified convulsions: Secondary | ICD-10-CM

## 2015-09-05 DIAGNOSIS — M4806 Spinal stenosis, lumbar region: Secondary | ICD-10-CM | POA: Diagnosis not present

## 2015-09-05 DIAGNOSIS — R413 Other amnesia: Secondary | ICD-10-CM | POA: Diagnosis not present

## 2015-09-05 DIAGNOSIS — M48062 Spinal stenosis, lumbar region with neurogenic claudication: Secondary | ICD-10-CM

## 2015-09-05 MED ORDER — LAMOTRIGINE ER 100 MG PO TB24: 200.0000 mg | ORAL_TABLET | Freq: Every day | ORAL | Status: DC
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
## 2015-09-05 NOTE — Patient Instructions (Signed)
Continue Lamictal at current dose Call for any seizure activity Memory score is stable 30/30 Follow up yearly next with Dr. Krista Blue

## 2015-09-06 NOTE — Progress Notes (Signed)
I have reviewed and agreed above plan. 

## 2015-09-08 ENCOUNTER — Telehealth: Payer: Self-pay | Admitting: Pulmonary Disease

## 2015-09-08 ENCOUNTER — Ambulatory Visit (INDEPENDENT_AMBULATORY_CARE_PROVIDER_SITE_OTHER): Payer: Medicare Other | Admitting: Pulmonary Disease

## 2015-09-08 ENCOUNTER — Encounter: Payer: Self-pay | Admitting: Pulmonary Disease

## 2015-09-08 VITALS — BP 108/64 | HR 70 | Ht 63.0 in | Wt 200.0 lb

## 2015-09-08 DIAGNOSIS — R059 Cough, unspecified: Secondary | ICD-10-CM

## 2015-09-08 DIAGNOSIS — K219 Gastro-esophageal reflux disease without esophagitis: Secondary | ICD-10-CM

## 2015-09-08 DIAGNOSIS — J309 Allergic rhinitis, unspecified: Secondary | ICD-10-CM | POA: Diagnosis not present

## 2015-09-08 DIAGNOSIS — R05 Cough: Secondary | ICD-10-CM

## 2015-09-08 DIAGNOSIS — I2782 Chronic pulmonary embolism: Secondary | ICD-10-CM | POA: Diagnosis not present

## 2015-09-08 NOTE — Telephone Encounter (Signed)
IMAGING MAXILLOFACIAL CT W/O 07/11/15 (per radiologist): Clear sinuses with midline septum.
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
## 2015-09-08 NOTE — Patient Instructions (Signed)
   I'm referring you to an ENT doctor to help [place] figure out what's causing your cough.  Call me if you have any new breathing problems before your next appointment.  I will see you back in 3 months or sooner if needed.

## 2015-09-09 ENCOUNTER — Emergency Department (HOSPITAL_COMMUNITY): Payer: No Typology Code available for payment source

## 2015-09-09 ENCOUNTER — Encounter (HOSPITAL_COMMUNITY): Payer: Self-pay | Admitting: Emergency Medicine

## 2015-09-09 ENCOUNTER — Emergency Department (HOSPITAL_COMMUNITY)
Admission: EM | Admit: 2015-09-09 | Discharge: 2015-09-09 | Disposition: A | Discharge status: Home / Self Care | Payer: No Typology Code available for payment source | Attending: Emergency Medicine | Admitting: Emergency Medicine

## 2015-09-09 DIAGNOSIS — Y999 Unspecified external cause status: Secondary | ICD-10-CM | POA: Insufficient documentation

## 2015-09-09 DIAGNOSIS — Z96651 Presence of right artificial knee joint: Secondary | ICD-10-CM | POA: Insufficient documentation

## 2015-09-09 DIAGNOSIS — I10 Essential (primary) hypertension: Secondary | ICD-10-CM | POA: Insufficient documentation

## 2015-09-09 DIAGNOSIS — Y939 Activity, unspecified: Secondary | ICD-10-CM | POA: Insufficient documentation

## 2015-09-09 DIAGNOSIS — R609 Edema, unspecified: Secondary | ICD-10-CM | POA: Insufficient documentation

## 2015-09-09 DIAGNOSIS — Z79899 Other long term (current) drug therapy: Secondary | ICD-10-CM | POA: Insufficient documentation

## 2015-09-09 DIAGNOSIS — R0789 Other chest pain: Secondary | ICD-10-CM | POA: Diagnosis not present

## 2015-09-09 DIAGNOSIS — Z8673 Personal history of transient ischemic attack (TIA), and cerebral infarction without residual deficits: Secondary | ICD-10-CM | POA: Insufficient documentation

## 2015-09-09 DIAGNOSIS — R079 Chest pain, unspecified: Secondary | ICD-10-CM | POA: Diagnosis not present

## 2015-09-09 DIAGNOSIS — Y92410 Unspecified street and highway as the place of occurrence of the external cause: Secondary | ICD-10-CM | POA: Diagnosis not present

## 2015-09-09 DIAGNOSIS — R0602 Shortness of breath: Secondary | ICD-10-CM | POA: Diagnosis not present

## 2015-09-09 DIAGNOSIS — J45909 Unspecified asthma, uncomplicated: Secondary | ICD-10-CM | POA: Diagnosis not present

## 2015-09-09 DIAGNOSIS — S299XXA Unspecified injury of thorax, initial encounter: Secondary | ICD-10-CM | POA: Diagnosis not present

## 2015-09-09 DIAGNOSIS — Z7982 Long term (current) use of aspirin: Secondary | ICD-10-CM | POA: Insufficient documentation

## 2015-09-09 DIAGNOSIS — Z7901 Long term (current) use of anticoagulants: Secondary | ICD-10-CM | POA: Diagnosis not present

## 2015-09-09 DIAGNOSIS — Z7722 Contact with and (suspected) exposure to environmental tobacco smoke (acute) (chronic): Secondary | ICD-10-CM | POA: Diagnosis not present

## 2015-09-09 LAB — CBC
HEMATOCRIT: 42.7 % (ref 36.0–46.0)
HEMOGLOBIN: 13.2 g/dL (ref 12.0–15.0)
MCH: 27.6 pg (ref 26.0–34.0)
MCHC: 30.9 g/dL (ref 30.0–36.0)
MCV: 89.1 fL (ref 78.0–100.0)
Platelets: 234 10*3/uL (ref 150–400)
RBC: 4.79 MIL/uL (ref 3.87–5.11)
RDW: 14.1 % (ref 11.5–15.5)
WBC: 7 10*3/uL (ref 4.0–10.5)

## 2015-09-09 LAB — BRAIN NATRIURETIC PEPTIDE: B Natriuretic Peptide: 65.5 pg/mL (ref 0.0–100.0)

## 2015-09-09 LAB — URINALYSIS, ROUTINE W REFLEX MICROSCOPIC
Bilirubin Urine: NEGATIVE
GLUCOSE, UA: NEGATIVE mg/dL
HGB URINE DIPSTICK: NEGATIVE
KETONES UR: NEGATIVE mg/dL
LEUKOCYTES UA: NEGATIVE
Nitrite: NEGATIVE
PH: 5.5 (ref 5.0–8.0)
Protein, ur: NEGATIVE mg/dL
Specific Gravity, Urine: 1.008 (ref 1.005–1.030)

## 2015-09-09 LAB — BASIC METABOLIC PANEL
ANION GAP: 7 (ref 5–15)
BUN: 18 mg/dL (ref 6–20)
CALCIUM: 9.5 mg/dL (ref 8.9–10.3)
CO2: 25 mmol/L (ref 22–32)
Chloride: 107 mmol/L (ref 101–111)
Creatinine, Ser: 1.2 mg/dL — ABNORMAL HIGH (ref 0.44–1.00)
GFR, EST AFRICAN AMERICAN: 51 mL/min — AB (ref >60)
GFR, EST NON AFRICAN AMERICAN: 44 mL/min — AB (ref >60)
Glucose, Bld: 121 mg/dL — ABNORMAL HIGH (ref 65–99)
POTASSIUM: 4 mmol/L (ref 3.5–5.1)
Sodium: 139 mmol/L (ref 135–145)

## 2015-09-09 LAB — I-STAT TROPONIN, ED
TROPONIN I, POC: 0.01 ng/mL (ref 0.00–0.08)
Troponin i, poc: 0 ng/mL (ref 0.00–0.08)

## 2015-09-09 MED ORDER — ASPIRIN 81 MG PO CHEW
324.0000 mg | CHEWABLE_TABLET | Freq: Once | ORAL | Status: AC
  Administered 2015-09-09: 324 mg via ORAL
  Filled 2015-09-09: qty 4

## 2015-09-09 MED ORDER — IPRATROPIUM-ALBUTEROL 0.5-2.5 (3) MG/3ML IN SOLN
3.0000 mL | Freq: Once | RESPIRATORY_TRACT | Status: AC
  Administered 2015-09-09: 3 mL via RESPIRATORY_TRACT
  Filled 2015-09-09: qty 3

## 2015-09-09 MED ORDER — NITROGLYCERIN 0.4 MG SL SUBL
0.4000 mg | SUBLINGUAL_TABLET | SUBLINGUAL | Status: DC | PRN
  Filled 2015-09-09: qty 1

## 2015-09-09 MED ORDER — ACETAMINOPHEN 325 MG PO TABS
975.0000 mg | ORAL_TABLET | Freq: Once | ORAL | Status: DC
  Filled 2015-09-09: qty 3
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
## 2015-09-09 NOTE — ED Notes (Signed)
Pt ambulates independently and with steady gait at time of discharge. Discharge instructions and follow up information reviewed with patient. No other questions or concerns voiced at this time.  

## 2015-09-09 NOTE — ED Notes (Signed)
Pt ambulatory to restroom
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
## 2015-09-17 ENCOUNTER — Other Ambulatory Visit: Payer: Self-pay | Admitting: Internal Medicine

## 2015-09-17 ENCOUNTER — Other Ambulatory Visit: Payer: Self-pay | Admitting: Neurology

## 2015-09-20 ENCOUNTER — Other Ambulatory Visit: Payer: Self-pay | Admitting: Pulmonary Disease

## 2015-09-22 ENCOUNTER — Other Ambulatory Visit: Payer: Self-pay | Admitting: Emergency Medicine

## 2015-09-22 MED ORDER — PANTOPRAZOLE SODIUM 40 MG PO TBEC: 40.0000 mg | DELAYED_RELEASE_TABLET | Freq: Every day | ORAL | Status: DC

## 2015-09-22 MED ORDER — FUROSEMIDE 40 MG PO TABS: 40.0000 mg | ORAL_TABLET | Freq: Every day | ORAL | Status: DC

## 2015-09-22 MED ORDER — ATORVASTATIN CALCIUM 40 MG PO TABS: 40.0000 mg | ORAL_TABLET | Freq: Every day | ORAL | Status: DC

## 2015-09-23 NOTE — Telephone Encounter (Signed)
Lasix script had printed instead going electronically. [facility]...[name]

## 2015-09-29 ENCOUNTER — Ambulatory Visit: Payer: Medicare HMO | Admitting: Nurse Practitioner

## 2015-10-05 DIAGNOSIS — K219 Gastro-esophageal reflux disease without esophagitis: Secondary | ICD-10-CM | POA: Diagnosis not present

## 2015-10-05 DIAGNOSIS — R05 Cough: Secondary | ICD-10-CM | POA: Diagnosis not present

## 2015-10-06 ENCOUNTER — Ambulatory Visit (HOSPITAL_COMMUNITY)
Admission: EM | Admit: 2015-10-06 | Discharge: 2015-10-06 | Disposition: A | Discharge status: Home / Self Care | Payer: Medicare Other | Attending: Emergency Medicine | Admitting: Emergency Medicine

## 2015-10-06 ENCOUNTER — Encounter (HOSPITAL_COMMUNITY): Payer: Self-pay | Admitting: Emergency Medicine

## 2015-10-06 DIAGNOSIS — R109 Unspecified abdominal pain: Secondary | ICD-10-CM

## 2015-10-06 LAB — POCT URINALYSIS DIP (DEVICE)
BILIRUBIN URINE: NEGATIVE
GLUCOSE, UA: NEGATIVE mg/dL
KETONES UR: NEGATIVE mg/dL
LEUKOCYTES UA: NEGATIVE
NITRITE: NEGATIVE
Protein, ur: NEGATIVE mg/dL
Specific Gravity, Urine: 1.015 (ref 1.005–1.030)
Urobilinogen, UA: 0.2 mg/dL (ref 0.0–1.0)
pH: 5 (ref 5.0–8.0)

## 2015-10-06 NOTE — ED Notes (Signed)
Bilateral flank pain.  Also complains of abdominal "swelling"/"bloating".  Denies n/v/d.  Last bm was yesterday and reported as normal.  Patient denies urinary symptoms.

## 2015-10-06 NOTE — Discharge Instructions (Signed)
Flank Pain Flank pain is pain in your side. The flank is the area of your side between your upper belly (abdomen) and your back. Pain in this area can be caused by many different things. Seama care and treatment will depend on the cause of your pain.  Rest as told by your doctor.  Drink enough fluids to keep your pee (urine) clear or pale yellow.  Only take medicine as told by your doctor.  Tell your doctor about any changes in your pain.  Follow up with your doctor. GET HELP RIGHT AWAY IF:   Your pain does not get better with medicine.   You have new symptoms or your symptoms get worse.  Your pain gets worse.   You have belly (abdominal) pain.   You are short of breath.   You always feel sick to your stomach (nauseous).   You keep throwing up (vomiting).   You have puffiness (swelling) in your belly.   You feel light-headed or you pass out (faint).   You have blood in your pee.  You have a fever or lasting symptoms for more than 2-3 days.  You have a fever and your symptoms suddenly get worse. MAKE SURE YOU:   Understand these instructions.  Will watch your condition.  Will get help right away if you are not doing well or get worse.   This information is not intended to replace advice given to you by your health care provider. Make sure you discuss any questions you have with your health care provider.   Document Released: 12/27/2007 Document Revised: 04/09/2014 Document Reviewed: 11/01/2011 Elsevier Interactive Patient Education 2016 [place] therapy can help ease sore, stiff, injured, and tight muscles and joints. Heat relaxes your muscles, which may help ease your pain.  RISKS AND COMPLICATIONS If you have any of the following conditions, do not use heat therapy unless your health care provider has approved:  Poor circulation.  Healing wounds or scarred skin in the area being treated.  Diabetes, heart disease,  or high blood pressure.  Not being able to feel (numbness) the area being treated.  Unusual swelling of the area being treated.  Active infections.  Blood clots.  Cancer.  Inability to communicate pain. This may include young children and people who have problems with their brain function (dementia).  Pregnancy. Heat therapy should only be used on old, pre-existing, or long-lasting (chronic) injuries. Do not use heat therapy on new injuries unless directed by your health care provider. HOW TO USE HEAT THERAPY There are several different kinds of heat therapy, including:  Moist heat pack.  Warm water bath.  Hot water bottle.  Electric heating pad.  Heated gel pack.  Heated wrap.  Electric heating pad. Use the heat therapy method suggested by your health care provider. Follow your health care provider's instructions on when and how to use heat therapy. GENERAL HEAT THERAPY RECOMMENDATIONS  Do not sleep while using heat therapy. Only use heat therapy while you are awake.  Your skin may turn pink while using heat therapy. Do not use heat therapy if your skin turns red.  Do not use heat therapy if you have new pain.  High heat or long exposure to heat can cause burns. Be careful when using heat therapy to avoid burning your skin.  Do not use heat therapy on areas of your skin that are already irritated, such as with a rash or sunburn. SEEK MEDICAL CARE IF:  You have blisters, redness, swelling, or numbness.  You have new pain.  Your pain is worse. MAKE SURE YOU:  Understand these instructions.  Will watch your condition.  Will get help right away if you are not doing well or get worse.   This information is not intended to replace advice given to you by your health care provider. Make sure you discuss any questions you have with your health care provider.   Document Released: 06/11/2011 Document Revised: 04/09/2014 Document Reviewed: 05/12/2013 Elsevier  Interactive Patient Education [employer].
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
## 2015-10-27 ENCOUNTER — Other Ambulatory Visit (INDEPENDENT_AMBULATORY_CARE_PROVIDER_SITE_OTHER): Payer: Medicare Other

## 2015-10-27 DIAGNOSIS — R0989 Other specified symptoms and signs involving the circulatory and respiratory systems: Secondary | ICD-10-CM

## 2015-11-08 DIAGNOSIS — R569 Unspecified convulsions: Secondary | ICD-10-CM | POA: Diagnosis not present

## 2015-11-08 DIAGNOSIS — Z86711 Personal history of pulmonary embolism: Secondary | ICD-10-CM | POA: Diagnosis not present

## 2015-11-08 DIAGNOSIS — I10 Essential (primary) hypertension: Secondary | ICD-10-CM | POA: Diagnosis not present

## 2015-11-08 DIAGNOSIS — Z7901 Long term (current) use of anticoagulants: Secondary | ICD-10-CM | POA: Diagnosis not present

## 2015-11-15 ENCOUNTER — Encounter (HOSPITAL_COMMUNITY): Payer: Self-pay

## 2015-11-15 ENCOUNTER — Other Ambulatory Visit (HOSPITAL_COMMUNITY): Payer: Medicare Other

## 2015-11-15 DIAGNOSIS — R0989 Other specified symptoms and signs involving the circulatory and respiratory systems: Secondary | ICD-10-CM

## 2015-11-15 NOTE — Progress Notes (Unsigned)
[name]'s echo was ordered 06/06/2015 and the echo was performed on 06/08/2015 at the hospital as an inpatient. If clinically indicated, please reorder the echo and have her scheduled at the [facility] office.

## 2015-11-16 ENCOUNTER — Other Ambulatory Visit: Payer: Self-pay | Admitting: Internal Medicine

## 2015-11-17 DIAGNOSIS — I10 Essential (primary) hypertension: Secondary | ICD-10-CM | POA: Diagnosis not present

## 2015-11-17 DIAGNOSIS — E785 Hyperlipidemia, unspecified: Secondary | ICD-10-CM | POA: Diagnosis not present

## 2015-11-17 DIAGNOSIS — J45909 Unspecified asthma, uncomplicated: Secondary | ICD-10-CM | POA: Diagnosis not present

## 2015-11-17 DIAGNOSIS — I2510 Atherosclerotic heart disease of native coronary artery without angina pectoris: Secondary | ICD-10-CM | POA: Diagnosis not present

## 2015-11-17 DIAGNOSIS — I252 Old myocardial infarction: Secondary | ICD-10-CM | POA: Diagnosis not present

## 2015-11-17 NOTE — Telephone Encounter (Signed)
ambien last filled 07/21/15 according to [place] controlled substance database - all meds approved

## 2015-11-17 NOTE — Telephone Encounter (Signed)
Please advise, [name] is not do until 12/04/15, states to not be taken chronically.

## 2015-11-18 NOTE — Telephone Encounter (Signed)
RX faxed to [facility] 

## 2015-11-28 ENCOUNTER — Other Ambulatory Visit: Payer: Self-pay | Admitting: Internal Medicine

## 2015-11-29 ENCOUNTER — Encounter: Payer: Medicare Other | Admitting: Internal Medicine

## 2015-11-29 ENCOUNTER — Encounter: Payer: Self-pay | Admitting: Internal Medicine

## 2015-11-29 DIAGNOSIS — Z1211 Encounter for screening for malignant neoplasm of colon: Secondary | ICD-10-CM | POA: Diagnosis not present

## 2015-11-29 DIAGNOSIS — D126 Benign neoplasm of colon, unspecified: Secondary | ICD-10-CM | POA: Diagnosis not present

## 2015-11-29 DIAGNOSIS — D123 Benign neoplasm of transverse colon: Secondary | ICD-10-CM | POA: Diagnosis not present

## 2015-11-30 ENCOUNTER — Other Ambulatory Visit: Payer: Self-pay | Admitting: Gastroenterology

## 2015-11-30 DIAGNOSIS — D123 Benign neoplasm of transverse colon: Secondary | ICD-10-CM | POA: Diagnosis not present

## 2015-12-11 ENCOUNTER — Ambulatory Visit (HOSPITAL_COMMUNITY)
Admission: EM | Admit: 2015-12-11 | Discharge: 2015-12-11 | Disposition: A | Discharge status: Home / Self Care | Payer: Medicare Other | Attending: Family Medicine | Admitting: Family Medicine

## 2015-12-11 ENCOUNTER — Encounter (HOSPITAL_COMMUNITY): Payer: Self-pay | Admitting: Family Medicine

## 2015-12-11 DIAGNOSIS — R202 Paresthesia of skin: Secondary | ICD-10-CM | POA: Diagnosis not present
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
## 2015-12-11 NOTE — Discharge Instructions (Signed)
The temporary numbness that he was experiencing her arm and then later in your lower legs does not constitute a stroke. You're already taking medicine that should prevent having a stroke.  Signs of a stroke would be double vision, slurred speech, weakness in arm or leg one side of the body, or loss of consciousness. She did get one of these, call 911 and go to the emergency department

## 2015-12-11 NOTE — ED Triage Notes (Signed)
Patient states that last night she felt disoriented and numbness in her right hand and left lower extremities. She has a hx of heart attack at 75 y/o. Feels disoriented now. Knows where she is and is able to answer questions during triage.

## 2015-12-14 ENCOUNTER — Telehealth: Payer: Self-pay | Admitting: Neurology

## 2015-12-14 NOTE — Telephone Encounter (Signed)
Patient called to request xray of head, states she has blood clots and head is swollen, please call [phone].

## 2015-12-14 NOTE — Telephone Encounter (Signed)
She is seen here for her seizure disorder. She has a history of pulmonary emboli.  She is not short of breath and she has no swelling in her legs.  Says she has no swelling anywhere.  She is just have an increase in headaches over the last 2-3 weeks.  Says she is going to see her PCP about her headaches.  Dr. Krista Blue reviewed her chart - history of migraines noted.  No concerns on her last MRI brain.  She has medication to take for headaches at home.  She should keep her appt w/ PCP.  We are glad to evaluate further, if needed.

## 2015-12-23 ENCOUNTER — Other Ambulatory Visit: Payer: Self-pay | Admitting: Internal Medicine

## 2016-01-17 ENCOUNTER — Other Ambulatory Visit (HOSPITAL_COMMUNITY): Payer: Self-pay | Admitting: *Deleted

## 2016-01-20 ENCOUNTER — Telehealth (HOSPITAL_COMMUNITY): Payer: Self-pay | Admitting: Emergency Medicine

## 2016-01-20 ENCOUNTER — Other Ambulatory Visit: Payer: Self-pay | Admitting: Internal Medicine

## 2016-01-20 ENCOUNTER — Encounter (HOSPITAL_COMMUNITY): Payer: Self-pay | Admitting: Emergency Medicine

## 2016-01-20 ENCOUNTER — Ambulatory Visit (HOSPITAL_COMMUNITY)
Admission: EM | Admit: 2016-01-20 | Discharge: 2016-01-20 | Disposition: A | Discharge status: Home / Self Care | Payer: Medicare Other | Attending: Family Medicine | Admitting: Family Medicine

## 2016-01-20 DIAGNOSIS — M25552 Pain in left hip: Secondary | ICD-10-CM

## 2016-01-20 MED ORDER — PREDNISONE 20 MG PO TABS: ORAL_TABLET | ORAL | 0 refills | Status: DC
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
## 2016-01-20 NOTE — Telephone Encounter (Signed)
Per pt, called in medication to [facility])  Rx was sent originally to [facility] (Mail Order)

## 2016-01-20 NOTE — ED Triage Notes (Signed)
Here for intermittent left side hip pain onset x5 days  Reports pain increases w/activity  Denies inj/trauma  A&O x4... NAD

## 2016-01-23 ENCOUNTER — Telehealth (HOSPITAL_COMMUNITY): Payer: Self-pay | Admitting: Emergency Medicine

## 2016-01-23 NOTE — Telephone Encounter (Signed)
Patient came to department asking about swelling in left hand.  Patient thinks it could be related to an allergy to medication.  Spoke with Dr Joseph Art about patient situation and concern.  Dr Joseph Art said to stop taking prednisone.  Patient's reason for taking prednisone is no longer hurting.  Left hand is slightly swollen .  Denies any injury to this hand.

## 2016-01-24 ENCOUNTER — Encounter: Payer: Self-pay | Admitting: Internal Medicine

## 2016-01-24 ENCOUNTER — Ambulatory Visit (INDEPENDENT_AMBULATORY_CARE_PROVIDER_SITE_OTHER): Payer: Medicare Other | Admitting: Internal Medicine

## 2016-01-24 ENCOUNTER — Other Ambulatory Visit (INDEPENDENT_AMBULATORY_CARE_PROVIDER_SITE_OTHER): Payer: Medicare Other

## 2016-01-24 VITALS — BP 136/82 | HR 68 | Temp 98.6°F | Resp 16 | Ht 63.0 in | Wt 212.0 lb

## 2016-01-24 DIAGNOSIS — K219 Gastro-esophageal reflux disease without esophagitis: Secondary | ICD-10-CM | POA: Diagnosis not present

## 2016-01-24 DIAGNOSIS — F419 Anxiety disorder, unspecified: Secondary | ICD-10-CM

## 2016-01-24 DIAGNOSIS — Z0000 Encounter for general adult medical examination without abnormal findings: Secondary | ICD-10-CM | POA: Diagnosis not present

## 2016-01-24 DIAGNOSIS — R600 Localized edema: Secondary | ICD-10-CM | POA: Diagnosis not present

## 2016-01-24 DIAGNOSIS — F329 Major depressive disorder, single episode, unspecified: Secondary | ICD-10-CM

## 2016-01-24 DIAGNOSIS — F32A Depression, unspecified: Secondary | ICD-10-CM

## 2016-01-24 DIAGNOSIS — E0500 Thyrotoxicosis with diffuse goiter without thyrotoxic crisis or storm: Secondary | ICD-10-CM

## 2016-01-24 DIAGNOSIS — M85861 Other specified disorders of bone density and structure, right lower leg: Secondary | ICD-10-CM | POA: Diagnosis not present

## 2016-01-24 DIAGNOSIS — N183 Chronic kidney disease, stage 3 unspecified: Secondary | ICD-10-CM

## 2016-01-24 DIAGNOSIS — R7303 Prediabetes: Secondary | ICD-10-CM

## 2016-01-24 DIAGNOSIS — I2510 Atherosclerotic heart disease of native coronary artery without angina pectoris: Secondary | ICD-10-CM

## 2016-01-24 DIAGNOSIS — A6000 Herpesviral infection of urogenital system, unspecified: Secondary | ICD-10-CM

## 2016-01-24 DIAGNOSIS — I10 Essential (primary) hypertension: Secondary | ICD-10-CM

## 2016-01-24 DIAGNOSIS — R569 Unspecified convulsions: Secondary | ICD-10-CM

## 2016-01-24 DIAGNOSIS — M85862 Other specified disorders of bone density and structure, left lower leg: Secondary | ICD-10-CM

## 2016-01-24 DIAGNOSIS — G479 Sleep disorder, unspecified: Secondary | ICD-10-CM

## 2016-01-24 LAB — COMPREHENSIVE METABOLIC PANEL
ALT: 16 U/L (ref 0–35)
AST: 14 U/L (ref 0–37)
Albumin: 3.9 g/dL (ref 3.5–5.2)
Alkaline Phosphatase: 103 U/L (ref 39–117)
BILIRUBIN TOTAL: 0.3 mg/dL (ref 0.2–1.2)
BUN: 19 mg/dL (ref 6–23)
CALCIUM: 9.4 mg/dL (ref 8.4–10.5)
CO2: 30 mEq/L (ref 19–32)
CREATININE: 1.23 mg/dL — AB (ref 0.40–1.20)
Chloride: 102 mEq/L (ref 96–112)
GFR: 54.84 mL/min — AB (ref >60.00)
Glucose, Bld: 98 mg/dL (ref 70–99)
Potassium: 3.9 mEq/L (ref 3.5–5.1)
Sodium: 138 mEq/L (ref 135–145)
Total Protein: 7.7 g/dL (ref 6.0–8.3)

## 2016-01-24 LAB — LIPID PANEL
CHOL/HDL RATIO: 4
Cholesterol: 181 mg/dL (ref 0–200)
HDL: 50.1 mg/dL (ref >39.00)
LDL Cholesterol: 103 mg/dL — ABNORMAL HIGH (ref 0–99)
NONHDL: 130.85
Triglycerides: 139 mg/dL (ref 0.0–149.0)
VLDL: 27.8 mg/dL (ref 0.0–40.0)

## 2016-01-24 LAB — T3, FREE: T3, Free: 3.4 pg/mL (ref 2.3–4.2)

## 2016-01-24 LAB — T4, FREE: Free T4: 0.91 ng/dL (ref 0.60–1.60)

## 2016-01-24 LAB — TSH: TSH: 3.33 u[IU]/mL (ref 0.35–4.50)

## 2016-01-24 LAB — HEMOGLOBIN A1C: Hgb A1c MFr Bld: 6.2 % (ref 4.6–6.5)

## 2016-01-24 MED ORDER — VALACYCLOVIR HCL 1 G PO TABS: 1000.0000 mg | ORAL_TABLET | Freq: Every day | ORAL | 0 refills | Status: DC

## 2016-01-24 NOTE — Assessment & Plan Note (Signed)
Recheck dexa discussed calcium and vitamin D Stressed regular exercise

## 2016-01-24 NOTE — Assessment & Plan Note (Signed)
Takes [name] only as needed

## 2016-01-24 NOTE — Patient Instructions (Addendum)
  [name] , Thank you for taking time to come for your Medicare Wellness Visit. I appreciate your ongoing commitment to your health goals. Please review the following plan we discussed and let me know if I can assist you in the future.   These are the goals we discussed: Goals    Work on weight loss      This is a list of the screening recommended for you and due dates:  Health Maintenance  Topic Date Due  . Tetanus Vaccine  04/02/2013  . Mammogram  04/13/2017  . Colon Cancer Screening  11/28/2020  . Flu Shot  Completed  . DEXA scan (bone density measurement)  Completed  . Shingles Vaccine  Completed  . Pneumonia vaccines  Completed     Test(s) ordered today. Your results will be released to [place] (or called to you) after review, usually within 72hours after test completion. If any changes need to be made, you will be notified at that same time.  All other Health Maintenance issues reviewed.   All recommended immunizations and age-appropriate screenings are up-to-date or discussed.  No immunizations administered today.   Medications reviewed and updated.  No changes recommended at this time.  A bone density scan was ordered.    Please followup in 6 months
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
## 2016-01-24 NOTE — Assessment & Plan Note (Signed)
Check a1c Work on weight loss

## 2016-01-24 NOTE — Assessment & Plan Note (Signed)
Takes valtrex as needed Last outbreak 3 months ago Has 2-3 episodes a year Continue valtrex as needed

## 2016-01-24 NOTE — Assessment & Plan Note (Signed)
management per neurology No seizure in years On lamictal

## 2016-01-24 NOTE — Assessment & Plan Note (Signed)
cmp

## 2016-01-24 NOTE — Assessment & Plan Note (Signed)
Controlled, stable Continue current dose of medication  

## 2016-01-24 NOTE — Assessment & Plan Note (Signed)
GERD controlled Continue daily medication  

## 2016-01-24 NOTE — Assessment & Plan Note (Addendum)
Check tfts - if normal will continue to monitor If tfts abnormal will refer back to Dr Chalmers Cater

## 2016-01-24 NOTE — Assessment & Plan Note (Signed)
BP well controlled Current regimen effective and well tolerated Continue current medications at current doses cmp  
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
## 2016-01-24 NOTE — Assessment & Plan Note (Signed)
Taking lasix daily - does not seem to help Work on weight loss Elevated legs when sitting Continue lasix at current dose

## 2016-01-24 NOTE — Progress Notes (Signed)
Pre visit review using our clinic review tool, if applicable. No additional management support is needed unless otherwise documented below in the visit note. 

## 2016-01-25 LAB — CBC WITH DIFFERENTIAL/PLATELET
Basophils Absolute: 276 cells/uL — ABNORMAL HIGH (ref 0–200)
Basophils Relative: 2 %
Eosinophils Absolute: 276 cells/uL (ref 15–500)
Eosinophils Relative: 2 %
HEMATOCRIT: 39.6 % (ref 35.0–45.0)
Hemoglobin: 13.4 g/dL (ref 11.7–15.5)
LYMPHS PCT: 43 %
Lymphs Abs: 5934 cells/uL — ABNORMAL HIGH (ref 850–3900)
MCH: 28.6 pg (ref 27.0–33.0)
MCHC: 33.8 g/dL (ref 32.0–36.0)
MCV: 84.6 fL (ref 80.0–100.0)
MONO ABS: 1104 {cells}/uL — AB (ref 200–950)
MPV: 9.5 fL (ref 7.5–12.5)
Monocytes Relative: 8 %
NEUTROS PCT: 45 %
Neutro Abs: 6210 cells/uL (ref 1500–7800)
Platelets: 431 10*3/uL — ABNORMAL HIGH (ref 140–400)
RBC: 4.68 MIL/uL (ref 3.80–5.10)
RDW: 15.7 % — AB (ref 11.0–15.0)
WBC: 13.8 10*3/uL — AB (ref 3.8–10.8)

## 2016-01-26 ENCOUNTER — Encounter: Payer: Self-pay | Admitting: Emergency Medicine

## 2016-01-26 NOTE — Progress Notes (Signed)
Pre visit review using our clinic review tool, if applicable. No additional management support is needed unless otherwise documented below in the visit note. 

## 2016-02-03 ENCOUNTER — Encounter: Payer: Medicare Other | Admitting: Internal Medicine

## 2016-02-08 ENCOUNTER — Ambulatory Visit (INDEPENDENT_AMBULATORY_CARE_PROVIDER_SITE_OTHER)
Admission: RE | Admit: 2016-02-08 | Discharge: 2016-02-08 | Disposition: A | Discharge status: Home / Self Care | Payer: Medicare Other | Source: Ambulatory Visit | Attending: Internal Medicine | Admitting: Internal Medicine

## 2016-02-08 DIAGNOSIS — M85862 Other specified disorders of bone density and structure, left lower leg: Secondary | ICD-10-CM

## 2016-02-08 DIAGNOSIS — M85861 Other specified disorders of bone density and structure, right lower leg: Secondary | ICD-10-CM | POA: Diagnosis not present

## 2016-02-10 ENCOUNTER — Encounter: Payer: Self-pay | Admitting: Internal Medicine

## 2016-02-17 ENCOUNTER — Other Ambulatory Visit: Payer: Self-pay | Admitting: Internal Medicine

## 2016-02-22 DIAGNOSIS — I10 Essential (primary) hypertension: Secondary | ICD-10-CM | POA: Diagnosis not present

## 2016-02-22 DIAGNOSIS — I252 Old myocardial infarction: Secondary | ICD-10-CM | POA: Diagnosis not present

## 2016-02-22 DIAGNOSIS — I2510 Atherosclerotic heart disease of native coronary artery without angina pectoris: Secondary | ICD-10-CM | POA: Diagnosis not present

## 2016-02-22 DIAGNOSIS — R0609 Other forms of dyspnea: Secondary | ICD-10-CM | POA: Diagnosis not present

## 2016-02-22 DIAGNOSIS — R002 Palpitations: Secondary | ICD-10-CM | POA: Diagnosis not present

## 2016-03-01 DIAGNOSIS — R002 Palpitations: Secondary | ICD-10-CM | POA: Diagnosis not present

## 2016-03-01 DIAGNOSIS — J45909 Unspecified asthma, uncomplicated: Secondary | ICD-10-CM | POA: Diagnosis not present

## 2016-03-01 DIAGNOSIS — R0609 Other forms of dyspnea: Secondary | ICD-10-CM | POA: Diagnosis not present

## 2016-03-01 DIAGNOSIS — I252 Old myocardial infarction: Secondary | ICD-10-CM | POA: Diagnosis not present

## 2016-03-01 DIAGNOSIS — I2510 Atherosclerotic heart disease of native coronary artery without angina pectoris: Secondary | ICD-10-CM | POA: Diagnosis not present

## 2016-03-16 ENCOUNTER — Other Ambulatory Visit: Payer: Self-pay | Admitting: Internal Medicine

## 2016-03-16 DIAGNOSIS — Z1231 Encounter for screening mammogram for malignant neoplasm of breast: Secondary | ICD-10-CM

## 2016-03-19 DIAGNOSIS — Z1211 Encounter for screening for malignant neoplasm of colon: Secondary | ICD-10-CM | POA: Diagnosis not present

## 2016-03-19 DIAGNOSIS — K5900 Constipation, unspecified: Secondary | ICD-10-CM | POA: Diagnosis not present

## 2016-03-30 ENCOUNTER — Ambulatory Visit (HOSPITAL_COMMUNITY)
Admission: EM | Admit: 2016-03-30 | Discharge: 2016-03-30 | Disposition: A | Discharge status: Home / Self Care | Payer: Medicare Other | Attending: Family Medicine | Admitting: Family Medicine

## 2016-03-30 ENCOUNTER — Encounter (HOSPITAL_COMMUNITY): Payer: Self-pay | Admitting: *Deleted

## 2016-03-30 DIAGNOSIS — R059 Cough, unspecified: Secondary | ICD-10-CM

## 2016-03-30 DIAGNOSIS — R05 Cough: Secondary | ICD-10-CM | POA: Diagnosis not present

## 2016-03-30 MED ORDER — BENZONATATE 100 MG PO CAPS: 100.0000 mg | ORAL_CAPSULE | Freq: Three times a day (TID) | ORAL | 0 refills | Status: DC

## 2016-03-30 NOTE — Discharge Instructions (Signed)
See your Physician for recheck next week  °
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
## 2016-03-30 NOTE — ED Triage Notes (Signed)
Pt   Reports  Has   A  Cough        Pain is  Worse  When     She   Coughs   No  Nausea  Vomiting         No  Diarrhea        Drove     Here        The     Cough  Is  Non  Productive

## 2016-04-01 ENCOUNTER — Emergency Department (HOSPITAL_COMMUNITY)
Admission: EM | Admit: 2016-04-01 | Discharge: 2016-04-01 | Disposition: A | Discharge status: Home / Self Care | Payer: Medicare Other | Attending: Physician Assistant | Admitting: Physician Assistant

## 2016-04-01 ENCOUNTER — Emergency Department (HOSPITAL_COMMUNITY): Payer: Medicare Other

## 2016-04-01 ENCOUNTER — Encounter (HOSPITAL_COMMUNITY): Payer: Self-pay | Admitting: *Deleted

## 2016-04-01 DIAGNOSIS — Z7982 Long term (current) use of aspirin: Secondary | ICD-10-CM | POA: Diagnosis not present

## 2016-04-01 DIAGNOSIS — J45909 Unspecified asthma, uncomplicated: Secondary | ICD-10-CM | POA: Insufficient documentation

## 2016-04-01 DIAGNOSIS — Z79899 Other long term (current) drug therapy: Secondary | ICD-10-CM | POA: Insufficient documentation

## 2016-04-01 DIAGNOSIS — Z7722 Contact with and (suspected) exposure to environmental tobacco smoke (acute) (chronic): Secondary | ICD-10-CM | POA: Insufficient documentation

## 2016-04-01 DIAGNOSIS — Z7901 Long term (current) use of anticoagulants: Secondary | ICD-10-CM | POA: Diagnosis not present

## 2016-04-01 DIAGNOSIS — Z8673 Personal history of transient ischemic attack (TIA), and cerebral infarction without residual deficits: Secondary | ICD-10-CM | POA: Insufficient documentation

## 2016-04-01 DIAGNOSIS — I2510 Atherosclerotic heart disease of native coronary artery without angina pectoris: Secondary | ICD-10-CM | POA: Diagnosis not present

## 2016-04-01 DIAGNOSIS — I10 Essential (primary) hypertension: Secondary | ICD-10-CM | POA: Insufficient documentation

## 2016-04-01 DIAGNOSIS — R0789 Other chest pain: Secondary | ICD-10-CM | POA: Insufficient documentation

## 2016-04-01 DIAGNOSIS — R079 Chest pain, unspecified: Secondary | ICD-10-CM | POA: Diagnosis present

## 2016-04-01 DIAGNOSIS — R05 Cough: Secondary | ICD-10-CM | POA: Diagnosis not present

## 2016-04-01 DIAGNOSIS — Z96651 Presence of right artificial knee joint: Secondary | ICD-10-CM | POA: Diagnosis not present

## 2016-04-01 LAB — URINALYSIS, ROUTINE W REFLEX MICROSCOPIC
Bilirubin Urine: NEGATIVE
GLUCOSE, UA: NEGATIVE mg/dL
Ketones, ur: NEGATIVE mg/dL
Nitrite: NEGATIVE
PROTEIN: NEGATIVE mg/dL
Specific Gravity, Urine: 1.01 (ref 1.005–1.030)
pH: 5.5 (ref 5.0–8.0)

## 2016-04-01 LAB — COMPREHENSIVE METABOLIC PANEL
ALT: 15 U/L (ref 14–54)
ANION GAP: 8 (ref 5–15)
AST: 16 U/L (ref 15–41)
Albumin: 3.2 g/dL — ABNORMAL LOW (ref 3.5–5.0)
Alkaline Phosphatase: 100 U/L (ref 38–126)
BUN: 14 mg/dL (ref 6–20)
CHLORIDE: 105 mmol/L (ref 101–111)
CO2: 26 mmol/L (ref 22–32)
CREATININE: 1.19 mg/dL — AB (ref 0.44–1.00)
Calcium: 9.5 mg/dL (ref 8.9–10.3)
GFR, EST AFRICAN AMERICAN: 51 mL/min — AB (ref >60)
GFR, EST NON AFRICAN AMERICAN: 44 mL/min — AB (ref >60)
Glucose, Bld: 107 mg/dL — ABNORMAL HIGH (ref 65–99)
POTASSIUM: 3.9 mmol/L (ref 3.5–5.1)
Sodium: 139 mmol/L (ref 135–145)
Total Bilirubin: 0.5 mg/dL (ref 0.3–1.2)
Total Protein: 7.6 g/dL (ref 6.5–8.1)

## 2016-04-01 LAB — D-DIMER, QUANTITATIVE: D-Dimer, Quant: 0.31 ug/mL-FEU (ref 0.00–0.50)

## 2016-04-01 LAB — CBC
HCT: 43.1 % (ref 36.0–46.0)
HEMOGLOBIN: 13.9 g/dL (ref 12.0–15.0)
MCH: 28 pg (ref 26.0–34.0)
MCHC: 32.3 g/dL (ref 30.0–36.0)
MCV: 86.9 fL (ref 78.0–100.0)
PLATELETS: 234 10*3/uL (ref 150–400)
RBC: 4.96 MIL/uL (ref 3.87–5.11)
RDW: 14.3 % (ref 11.5–15.5)
WBC: 6.6 10*3/uL (ref 4.0–10.5)

## 2016-04-01 LAB — URINALYSIS, MICROSCOPIC (REFLEX)

## 2016-04-01 LAB — LIPASE, BLOOD: LIPASE: 23 U/L (ref 11–51)

## 2016-04-01 MED ORDER — CYCLOBENZAPRINE HCL 10 MG PO TABS: 10.0000 mg | ORAL_TABLET | Freq: Two times a day (BID) | ORAL | 0 refills | Status: DC | PRN

## 2016-04-01 MED ORDER — ACETAMINOPHEN 500 MG PO TABS: 500.0000 mg | ORAL_TABLET | Freq: Four times a day (QID) | ORAL | 0 refills | Status: DC | PRN

## 2016-04-01 NOTE — Discharge Instructions (Signed)
You were seen today with pain on your right chest wall. You did not have a pulmonary embolism based on her blood work. Your chest x-ray looks good. It may be that this is a muscle pain. Especially because it happens when you coughed. Please take the muscle relaxant and ibuprofen to help with her pain. Please return with any concerns.
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
## 2016-04-01 NOTE — ED Triage Notes (Signed)
Pt reports right side pain that radiates around to her back x 2 days. Went to ucc but reports being given tessalon and it does not help her. Pt reports cough is non productive. Denies urinary symptoms.

## 2016-04-03 ENCOUNTER — Ambulatory Visit: Payer: Medicare Other | Admitting: Internal Medicine

## 2016-04-13 ENCOUNTER — Other Ambulatory Visit: Payer: Self-pay | Admitting: Internal Medicine

## 2016-04-16 ENCOUNTER — Ambulatory Visit: Payer: Medicare Other

## 2016-04-17 ENCOUNTER — Ambulatory Visit
Admission: RE | Admit: 2016-04-17 | Discharge: 2016-04-17 | Disposition: A | Discharge status: Home / Self Care | Payer: Medicare Other | Source: Ambulatory Visit | Attending: Internal Medicine | Admitting: Internal Medicine

## 2016-04-17 DIAGNOSIS — Z1231 Encounter for screening mammogram for malignant neoplasm of breast: Secondary | ICD-10-CM

## 2016-04-17 NOTE — Telephone Encounter (Signed)
Called refill into pharamcy spoke w/Jessica gave MD authorization. Updated med list.../lmb

## 2016-04-17 NOTE — Telephone Encounter (Signed)
Ok to fill 

## 2016-05-11 ENCOUNTER — Other Ambulatory Visit: Payer: Self-pay | Admitting: Internal Medicine

## 2016-05-11 ENCOUNTER — Other Ambulatory Visit: Payer: Self-pay | Admitting: Pulmonary Disease

## 2016-05-14 NOTE — Telephone Encounter (Signed)
Ok to fill 

## 2016-05-15 NOTE — Telephone Encounter (Signed)
Ok to fill 

## 2016-05-16 NOTE — Telephone Encounter (Signed)
[facility] spoke w/Katie gave MD authorization.../lm,b

## 2016-05-23 ENCOUNTER — Other Ambulatory Visit: Payer: Self-pay | Admitting: Cardiology

## 2016-05-23 DIAGNOSIS — R0609 Other forms of dyspnea: Secondary | ICD-10-CM | POA: Diagnosis not present

## 2016-05-23 DIAGNOSIS — I252 Old myocardial infarction: Secondary | ICD-10-CM | POA: Diagnosis not present

## 2016-05-23 DIAGNOSIS — I25119 Atherosclerotic heart disease of native coronary artery with unspecified angina pectoris: Secondary | ICD-10-CM | POA: Diagnosis not present

## 2016-05-23 DIAGNOSIS — E785 Hyperlipidemia, unspecified: Secondary | ICD-10-CM | POA: Diagnosis not present

## 2016-05-23 DIAGNOSIS — R079 Chest pain, unspecified: Secondary | ICD-10-CM

## 2016-05-23 DIAGNOSIS — I10 Essential (primary) hypertension: Secondary | ICD-10-CM | POA: Diagnosis not present

## 2016-05-31 DIAGNOSIS — H35033 Hypertensive retinopathy, bilateral: Secondary | ICD-10-CM | POA: Diagnosis not present

## 2016-05-31 DIAGNOSIS — Z961 Presence of intraocular lens: Secondary | ICD-10-CM | POA: Diagnosis not present

## 2016-05-31 DIAGNOSIS — H04123 Dry eye syndrome of bilateral lacrimal glands: Secondary | ICD-10-CM | POA: Diagnosis not present

## 2016-05-31 DIAGNOSIS — H524 Presbyopia: Secondary | ICD-10-CM | POA: Diagnosis not present

## 2016-05-31 DIAGNOSIS — H40023 Open angle with borderline findings, high risk, bilateral: Secondary | ICD-10-CM | POA: Diagnosis not present

## 2016-06-04 ENCOUNTER — Ambulatory Visit (HOSPITAL_COMMUNITY): Payer: Medicare Other

## 2016-06-06 ENCOUNTER — Ambulatory Visit (HOSPITAL_COMMUNITY)
Admission: RE | Admit: 2016-06-06 | Discharge: 2016-06-06 | Disposition: A | Discharge status: Home / Self Care | Payer: Medicare Other | Source: Ambulatory Visit | Attending: Cardiology | Admitting: Cardiology

## 2016-06-06 DIAGNOSIS — I10 Essential (primary) hypertension: Secondary | ICD-10-CM | POA: Diagnosis not present

## 2016-06-06 DIAGNOSIS — R079 Chest pain, unspecified: Secondary | ICD-10-CM

## 2016-06-06 DIAGNOSIS — R0609 Other forms of dyspnea: Secondary | ICD-10-CM | POA: Diagnosis not present

## 2016-06-06 DIAGNOSIS — I252 Old myocardial infarction: Secondary | ICD-10-CM | POA: Diagnosis not present

## 2016-06-06 DIAGNOSIS — I25119 Atherosclerotic heart disease of native coronary artery with unspecified angina pectoris: Secondary | ICD-10-CM | POA: Diagnosis not present

## 2016-06-06 MED ORDER — TECHNETIUM TC 99M TETROFOSMIN IV KIT
10.0000 | PACK | Freq: Once | INTRAVENOUS | Status: AC | PRN
  Administered 2016-06-06: 10 via INTRAVENOUS

## 2016-06-06 MED ORDER — REGADENOSON 0.4 MG/5ML IV SOLN
0.4000 mg | Freq: Once | INTRAVENOUS | Status: AC
  Administered 2016-06-06: 0.4 mg via INTRAVENOUS

## 2016-06-06 MED ORDER — TECHNETIUM TC 99M TETROFOSMIN IV KIT
30.0000 | PACK | Freq: Once | INTRAVENOUS | Status: AC | PRN
  Administered 2016-06-06: 30 via INTRAVENOUS

## 2016-06-06 MED ORDER — REGADENOSON 0.4 MG/5ML IV SOLN
INTRAVENOUS | Status: AC
  Administered 2016-06-06: 0.4 mg via INTRAVENOUS
  Filled 2016-06-06: qty 5

## 2016-06-08 ENCOUNTER — Other Ambulatory Visit: Payer: Self-pay | Admitting: Internal Medicine

## 2016-06-08 ENCOUNTER — Other Ambulatory Visit: Payer: Self-pay | Admitting: Nurse Practitioner

## 2016-06-11 NOTE — Telephone Encounter (Signed)
Please advise, not on current med list.  

## 2016-07-05 ENCOUNTER — Other Ambulatory Visit: Payer: Self-pay | Admitting: Internal Medicine

## 2016-07-05 NOTE — Telephone Encounter (Signed)
Not on med list for a few months - ? Taking.  BP was controlled when she was just here and apparently not on it.  Ok to refuse refill

## 2016-07-05 NOTE — Telephone Encounter (Signed)
Please advise, I dont see on pts med list.  

## 2016-07-17 ENCOUNTER — Encounter: Payer: Self-pay | Admitting: Internal Medicine

## 2016-07-17 ENCOUNTER — Ambulatory Visit (INDEPENDENT_AMBULATORY_CARE_PROVIDER_SITE_OTHER): Payer: Medicare Other | Admitting: Internal Medicine

## 2016-07-17 VITALS — BP 118/68 | HR 78 | Temp 97.8°F | Ht 63.0 in | Wt 207.0 lb

## 2016-07-17 DIAGNOSIS — Z6836 Body mass index (BMI) 36.0-36.9, adult: Secondary | ICD-10-CM

## 2016-07-17 DIAGNOSIS — R05 Cough: Secondary | ICD-10-CM | POA: Diagnosis not present

## 2016-07-17 DIAGNOSIS — R059 Cough, unspecified: Secondary | ICD-10-CM

## 2016-07-17 NOTE — Assessment & Plan Note (Signed)
Dry cough - for a least 3 months Has seen pulm  - they recommended seeing ENT at her last visit with them - she has not seen ENT Will refer to ENT Stressed making sure she is taking her allergy, GERD and asthma medication daily

## 2016-07-17 NOTE — Progress Notes (Signed)
Pre visit review using our clinic review tool, if applicable. No additional management support is needed unless otherwise documented below in the visit note. 
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
## 2016-07-17 NOTE — Patient Instructions (Addendum)
A referral was ordered for ENT for your cough and nutrition.    Take all of your medications daily.     Schedule a follow up visit with me 2-3 months

## 2016-07-17 NOTE — Assessment & Plan Note (Signed)
She wants to lose weight and is interested in seeing a nutritionist Will refer

## 2016-07-31 ENCOUNTER — Other Ambulatory Visit (INDEPENDENT_AMBULATORY_CARE_PROVIDER_SITE_OTHER): Payer: Medicare Other

## 2016-07-31 ENCOUNTER — Ambulatory Visit (INDEPENDENT_AMBULATORY_CARE_PROVIDER_SITE_OTHER): Payer: Medicare Other | Admitting: Internal Medicine

## 2016-07-31 VITALS — BP 130/80 | HR 79 | Temp 98.3°F | Resp 16 | Wt 207.0 lb

## 2016-07-31 DIAGNOSIS — R7303 Prediabetes: Secondary | ICD-10-CM

## 2016-07-31 DIAGNOSIS — I10 Essential (primary) hypertension: Secondary | ICD-10-CM | POA: Diagnosis not present

## 2016-07-31 DIAGNOSIS — N183 Chronic kidney disease, stage 3 unspecified: Secondary | ICD-10-CM

## 2016-07-31 DIAGNOSIS — E0500 Thyrotoxicosis with diffuse goiter without thyrotoxic crisis or storm: Secondary | ICD-10-CM | POA: Diagnosis not present

## 2016-07-31 DIAGNOSIS — M791 Myalgia, unspecified site: Secondary | ICD-10-CM

## 2016-07-31 DIAGNOSIS — R600 Localized edema: Secondary | ICD-10-CM | POA: Diagnosis not present

## 2016-07-31 DIAGNOSIS — R5383 Other fatigue: Secondary | ICD-10-CM

## 2016-07-31 LAB — COMPREHENSIVE METABOLIC PANEL
ALT: 15 U/L (ref 0–35)
AST: 15 U/L (ref 0–37)
Albumin: 3.9 g/dL (ref 3.5–5.2)
Alkaline Phosphatase: 92 U/L (ref 39–117)
BILIRUBIN TOTAL: 0.3 mg/dL (ref 0.2–1.2)
BUN: 16 mg/dL (ref 6–23)
CO2: 30 meq/L (ref 19–32)
CREATININE: 1.17 mg/dL (ref 0.40–1.20)
Calcium: 9.7 mg/dL (ref 8.4–10.5)
Chloride: 104 mEq/L (ref 96–112)
GFR: 58.01 mL/min — AB (ref >60.00)
GLUCOSE: 104 mg/dL — AB (ref 70–99)
Potassium: 4.3 mEq/L (ref 3.5–5.1)
SODIUM: 140 meq/L (ref 135–145)
TOTAL PROTEIN: 7.8 g/dL (ref 6.0–8.3)

## 2016-07-31 LAB — CBC WITH DIFFERENTIAL/PLATELET
BASOS ABS: 0.1 10*3/uL (ref 0.0–0.1)
Basophils Relative: 0.8 % (ref 0.0–3.0)
EOS ABS: 0.2 10*3/uL (ref 0.0–0.7)
Eosinophils Relative: 3.1 % (ref 0.0–5.0)
HCT: 42.7 % (ref 36.0–46.0)
Hemoglobin: 14 g/dL (ref 12.0–15.0)
LYMPHS ABS: 2.5 10*3/uL (ref 0.7–4.0)
Lymphocytes Relative: 38.2 % (ref 12.0–46.0)
MCHC: 32.8 g/dL (ref 30.0–36.0)
MCV: 87.7 fl (ref 78.0–100.0)
Monocytes Absolute: 0.6 10*3/uL (ref 0.1–1.0)
Monocytes Relative: 9.8 % (ref 3.0–12.0)
NEUTROS ABS: 3.1 10*3/uL (ref 1.4–7.7)
NEUTROS PCT: 48.1 % (ref 43.0–77.0)
PLATELETS: 256 10*3/uL (ref 150.0–400.0)
RBC: 4.87 Mil/uL (ref 3.87–5.11)
RDW: 14.8 % (ref 11.5–15.5)
WBC: 6.5 10*3/uL (ref 4.0–10.5)

## 2016-07-31 LAB — BRAIN NATRIURETIC PEPTIDE: PRO B NATRI PEPTIDE: 50 pg/mL (ref 0.0–100.0)

## 2016-07-31 LAB — TSH: TSH: 2.76 u[IU]/mL (ref 0.35–4.50)

## 2016-07-31 LAB — HEMOGLOBIN A1C: HEMOGLOBIN A1C: 6.3 % (ref 4.6–6.5)

## 2016-07-31 LAB — SEDIMENTATION RATE: Sed Rate: 102 mm/hr — ABNORMAL HIGH (ref 0–30)

## 2016-07-31 LAB — C-REACTIVE PROTEIN: CRP: 1.5 mg/dL (ref 0.5–20.0)

## 2016-07-31 LAB — CK: Total CK: 200 U/L — ABNORMAL HIGH (ref 7–177)
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
## 2016-07-31 NOTE — Patient Instructions (Addendum)
  Test(s) ordered today. Your results will be released to [place] (or called to you) after review, usually within 72hours after test completion. If any changes need to be made, you will be notified at that same time.   Medications reviewed and updated.   No changes recommended at this time.    Please followup if symptoms do not improve

## 2016-07-31 NOTE — Progress Notes (Signed)
Pre visit review using our clinic review tool, if applicable. No additional management support is needed unless otherwise documented below in the visit note. 

## 2016-08-01 ENCOUNTER — Encounter: Payer: Self-pay | Admitting: Internal Medicine

## 2016-08-01 ENCOUNTER — Telehealth: Payer: Self-pay | Admitting: Internal Medicine

## 2016-08-01 DIAGNOSIS — R5383 Other fatigue: Secondary | ICD-10-CM | POA: Insufficient documentation

## 2016-08-01 DIAGNOSIS — M353 Polymyalgia rheumatica: Secondary | ICD-10-CM

## 2016-08-01 DIAGNOSIS — M791 Myalgia, unspecified site: Secondary | ICD-10-CM | POA: Insufficient documentation

## 2016-08-01 MED ORDER — PREDNISONE 20 MG PO TABS: 20.0000 mg | ORAL_TABLET | Freq: Every day | ORAL | 0 refills | Status: DC

## 2016-08-01 NOTE — Telephone Encounter (Signed)
sent 

## 2016-08-01 NOTE — Telephone Encounter (Signed)
Spoke with pt, she is okay with starting the steriods, please send to [facility] on file. And okay with referral. Appt has been made for 1 week follow-up. Please add in sig that it needs to be taken with food, im not sure if she understood everything I told her.

## 2016-08-01 NOTE — Telephone Encounter (Signed)
Thyroid, liver and blood counts normal.  Kidney function minimally reduced - stable. Sugars in prediabetic range.  She has indication of muscle inflammation - the treatment for this is steroids.  We need to start her on them today and she needs to take them daily in the morning with food.  This may elevate her sugars.  She will need to see a rheumatologist - I will refer her.  She needs to follow up with me in one week.  Yesterday she said she had some headaches - but I need to know if these are new or chronic.  Also is she having any jaw pain, face pain or blurry vision - if so the dose of the steroids will need to be higher and she will need further evaluation.  Please let me know and I will send in the steroids to her POF.

## 2016-08-01 NOTE — Addendum Note (Signed)
Addended by: Earnstine Regal on: 08/01/2016 02:10 PM   Modules accepted: Orders

## 2016-08-01 NOTE — Assessment & Plan Note (Signed)
Check cmp 

## 2016-08-01 NOTE — Telephone Encounter (Signed)
Rec'd call the prednisone was sent to wrong pharmacy. Needing rx sent to [facility]. Resent to correct pharmacy...[name]

## 2016-08-01 NOTE — Assessment & Plan Note (Signed)
Significant fatigue for 1.[redacted] weeks Along with muscle pain Concern for PMR or statin induced muscle pain Check labs

## 2016-08-01 NOTE — Assessment & Plan Note (Signed)
Associated with fatigue for 1.5 weeks Will check ck, esr, crp to rule out inflammatory muscle disease

## 2016-08-01 NOTE — Assessment & Plan Note (Signed)
BP well controlled Continue current medication

## 2016-08-01 NOTE — Assessment & Plan Note (Signed)
Check tsh Adjust med if needed

## 2016-08-01 NOTE — Assessment & Plan Note (Signed)
Unknown cause Somewhat controlled with lasix 40 mg daily - may need to adjust medication in future, but today edema is minimal Continue low sodium diet, elevating legs and exercise Work on weight loss Does not tolerate compression socks Can refer to vascular for their opinion but has good pulses and I do not think there is any intervention needed

## 2016-08-01 NOTE — Assessment & Plan Note (Signed)
a1c today
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
## 2016-08-08 ENCOUNTER — Ambulatory Visit: Payer: Medicare Other | Admitting: Internal Medicine

## 2016-08-09 ENCOUNTER — Ambulatory Visit (INDEPENDENT_AMBULATORY_CARE_PROVIDER_SITE_OTHER): Payer: Medicare Other | Admitting: Internal Medicine

## 2016-08-09 ENCOUNTER — Other Ambulatory Visit (INDEPENDENT_AMBULATORY_CARE_PROVIDER_SITE_OTHER): Payer: Medicare Other

## 2016-08-09 ENCOUNTER — Encounter: Payer: Self-pay | Admitting: Internal Medicine

## 2016-08-09 VITALS — BP 118/62 | HR 85 | Temp 98.5°F | Resp 18 | Wt 209.0 lb

## 2016-08-09 DIAGNOSIS — R700 Elevated erythrocyte sedimentation rate: Secondary | ICD-10-CM

## 2016-08-09 DIAGNOSIS — R1012 Left upper quadrant pain: Secondary | ICD-10-CM | POA: Diagnosis not present

## 2016-08-09 DIAGNOSIS — R748 Abnormal levels of other serum enzymes: Secondary | ICD-10-CM | POA: Diagnosis not present

## 2016-08-09 LAB — COMPREHENSIVE METABOLIC PANEL
ALT: 11 U/L (ref 0–35)
AST: 12 U/L (ref 0–37)
Albumin: 3.7 g/dL (ref 3.5–5.2)
Alkaline Phosphatase: 92 U/L (ref 39–117)
BUN: 17 mg/dL (ref 6–23)
CALCIUM: 9.2 mg/dL (ref 8.4–10.5)
CHLORIDE: 104 meq/L (ref 96–112)
CO2: 29 meq/L (ref 19–32)
CREATININE: 1.17 mg/dL (ref 0.40–1.20)
GFR: 58.01 mL/min — ABNORMAL LOW (ref >60.00)
Glucose, Bld: 103 mg/dL — ABNORMAL HIGH (ref 70–99)
POTASSIUM: 3.8 meq/L (ref 3.5–5.1)
SODIUM: 139 meq/L (ref 135–145)
Total Bilirubin: 0.3 mg/dL (ref 0.2–1.2)
Total Protein: 7.1 g/dL (ref 6.0–8.3)

## 2016-08-09 LAB — URINALYSIS, ROUTINE W REFLEX MICROSCOPIC
Bilirubin Urine: NEGATIVE
Hgb urine dipstick: NEGATIVE
KETONES UR: NEGATIVE
Leukocytes, UA: NEGATIVE
Nitrite: NEGATIVE
SPECIFIC GRAVITY, URINE: 1.015 (ref 1.000–1.030)
Total Protein, Urine: NEGATIVE
URINE GLUCOSE: NEGATIVE
UROBILINOGEN UA: 0.2 (ref 0.0–1.0)
pH: 6 (ref 5.0–8.0)

## 2016-08-09 LAB — LIPASE: Lipase: 19 U/L (ref 11.0–59.0)

## 2016-08-09 LAB — AMYLASE: Amylase: 39 U/L (ref 27–131)

## 2016-08-09 LAB — SEDIMENTATION RATE: SED RATE: 75 mm/h — AB (ref 0–30)

## 2016-08-09 LAB — CK: CK TOTAL: 161 U/L (ref 7–177)

## 2016-08-09 NOTE — Assessment & Plan Note (Signed)
Recheck CK today - she is on a statin, but has been on it for a long time seems unlikely that she has myopathy related to the atorvastatin

## 2016-08-09 NOTE — Patient Instructions (Addendum)
Have blood work and your urine tested today.   An Ultrasound was ordered of your abdomen - someone will call you to schedule this.   Do not take the prednisone until we advise you to - we need to see how the blood work is.

## 2016-08-09 NOTE — Assessment & Plan Note (Signed)
Recheck ESR Concern for possible polymyalgia rheumatica, but she states her pain has improved We'll recheck today-she did not start the steroids so we will hold off until we get the results of blood work

## 2016-08-09 NOTE — Assessment & Plan Note (Addendum)
Started one week ago-just after she left here Seems to be very position related and therefore is likely musculoskeletal No gastrointestinal or urinary symptoms present Given persistence of pain and the severity of pain at this morning we'll check an abdominal ultrasound Also check blood work today including amylase and lipase, urinalysis and urine culture
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
## 2016-08-10 LAB — URINE CULTURE: Organism ID, Bacteria: NO GROWTH

## 2016-08-17 ENCOUNTER — Ambulatory Visit
Admission: RE | Admit: 2016-08-17 | Discharge: 2016-08-17 | Disposition: A | Discharge status: Home / Self Care | Payer: Medicare Other | Source: Ambulatory Visit | Attending: Internal Medicine | Admitting: Internal Medicine

## 2016-08-17 ENCOUNTER — Encounter: Payer: Self-pay | Admitting: Internal Medicine

## 2016-08-17 ENCOUNTER — Other Ambulatory Visit: Payer: Self-pay | Admitting: *Deleted

## 2016-08-17 DIAGNOSIS — R1012 Left upper quadrant pain: Secondary | ICD-10-CM

## 2016-08-17 DIAGNOSIS — D1771 Benign lipomatous neoplasm of kidney: Secondary | ICD-10-CM | POA: Insufficient documentation

## 2016-08-17 MED ORDER — ZOLPIDEM TARTRATE 5 MG PO TABS: ORAL_TABLET | ORAL | 3 refills | Status: DC

## 2016-08-17 MED ORDER — ATORVASTATIN CALCIUM 40 MG PO TABS: 40.0000 mg | ORAL_TABLET | Freq: Every day | ORAL | 5 refills | Status: DC

## 2016-08-17 MED ORDER — MONTELUKAST SODIUM 10 MG PO TABS: 10.0000 mg | ORAL_TABLET | Freq: Every day | ORAL | 5 refills | Status: DC

## 2016-08-17 MED ORDER — BUTALBITAL-APAP-CAFFEINE 50-325-40 MG PO TABS: ORAL_TABLET | ORAL | 0 refills | Status: DC

## 2016-08-17 MED ORDER — ALBUTEROL SULFATE HFA 108 (90 BASE) MCG/ACT IN AERS: INHALATION_SPRAY | RESPIRATORY_TRACT | 2 refills | Status: DC

## 2016-08-17 MED ORDER — PANTOPRAZOLE SODIUM 40 MG PO TBEC: 40.0000 mg | DELAYED_RELEASE_TABLET | Freq: Every day | ORAL | 5 refills | Status: DC

## 2016-08-17 MED ORDER — NORTRIPTYLINE HCL 25 MG PO CAPS: 25.0000 mg | ORAL_CAPSULE | Freq: Every day | ORAL | 5 refills | Status: DC

## 2016-08-17 MED ORDER — FUROSEMIDE 40 MG PO TABS: 40.0000 mg | ORAL_TABLET | Freq: Every day | ORAL | 5 refills | Status: DC

## 2016-08-17 NOTE — Telephone Encounter (Signed)
[place] controlled substance database checked.  Ok to fill medications.

## 2016-08-17 NOTE — Telephone Encounter (Signed)
Printed both scripts, MD signed [facility]ed to [facility]...Johny Chess
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
## 2016-08-22 DIAGNOSIS — I10 Essential (primary) hypertension: Secondary | ICD-10-CM | POA: Diagnosis not present

## 2016-08-22 DIAGNOSIS — I25118 Atherosclerotic heart disease of native coronary artery with other forms of angina pectoris: Secondary | ICD-10-CM | POA: Diagnosis not present

## 2016-08-22 DIAGNOSIS — E785 Hyperlipidemia, unspecified: Secondary | ICD-10-CM | POA: Diagnosis not present

## 2016-08-22 DIAGNOSIS — I252 Old myocardial infarction: Secondary | ICD-10-CM | POA: Diagnosis not present

## 2016-08-23 ENCOUNTER — Encounter: Payer: Self-pay | Admitting: Internal Medicine

## 2016-08-23 ENCOUNTER — Ambulatory Visit (INDEPENDENT_AMBULATORY_CARE_PROVIDER_SITE_OTHER): Payer: Medicare Other | Admitting: Internal Medicine

## 2016-08-23 VITALS — BP 118/70 | HR 83 | Temp 98.1°F | Resp 16 | Wt 206.0 lb

## 2016-08-23 DIAGNOSIS — M353 Polymyalgia rheumatica: Secondary | ICD-10-CM

## 2016-08-23 DIAGNOSIS — R700 Elevated erythrocyte sedimentation rate: Secondary | ICD-10-CM

## 2016-08-23 HISTORY — DX: Polymyalgia rheumatica: M35.3
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
## 2016-08-23 NOTE — Patient Instructions (Addendum)
Polymyalgia Rheumatic  - PMR  Decrease prednisone to 10 mg on Tuesday.    See the rheumatology on 6/7 and they will manage your steroids from there.

## 2016-08-23 NOTE — Assessment & Plan Note (Signed)
?   PMR or not Has started prednisone Has rheumatology appointment to help sort this out

## 2016-08-23 NOTE — Assessment & Plan Note (Signed)
?  Diagnosis of PMR or not Esr very elevated and initially presented with fatigue and muscle pain - symptoms improved without treatment - just exercise, but ESR still elevated Prednisone started - ? Really need or not Decrease prednisone to 10 mg daily - ? Need at this point,  ? Truly has PMR or not Has rheumatology appointment for further evaluation

## 2016-09-04 ENCOUNTER — Ambulatory Visit (INDEPENDENT_AMBULATORY_CARE_PROVIDER_SITE_OTHER): Payer: Medicare Other | Admitting: Neurology

## 2016-09-04 ENCOUNTER — Encounter: Payer: Self-pay | Admitting: Neurology

## 2016-09-04 VITALS — BP 132/69 | HR 81 | Ht 63.0 in | Wt 207.0 lb

## 2016-09-04 DIAGNOSIS — R569 Unspecified convulsions: Secondary | ICD-10-CM | POA: Diagnosis not present

## 2016-09-04 DIAGNOSIS — M353 Polymyalgia rheumatica: Secondary | ICD-10-CM | POA: Insufficient documentation

## 2016-09-04 DIAGNOSIS — R05 Cough: Secondary | ICD-10-CM | POA: Diagnosis not present

## 2016-09-04 DIAGNOSIS — G3184 Mild cognitive impairment, so stated: Secondary | ICD-10-CM | POA: Diagnosis not present

## 2016-09-04 DIAGNOSIS — R51 Headache: Secondary | ICD-10-CM

## 2016-09-04 DIAGNOSIS — R519 Headache, unspecified: Secondary | ICD-10-CM

## 2016-09-04 MED ORDER — LAMOTRIGINE ER 100 MG PO TB24: 200.0000 mg | ORAL_TABLET | Freq: Every day | ORAL | 4 refills | Status: DC
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
## 2016-09-06 DIAGNOSIS — M353 Polymyalgia rheumatica: Secondary | ICD-10-CM | POA: Diagnosis not present

## 2016-09-06 DIAGNOSIS — M791 Myalgia: Secondary | ICD-10-CM | POA: Diagnosis not present

## 2016-09-06 DIAGNOSIS — M2550 Pain in unspecified joint: Secondary | ICD-10-CM | POA: Diagnosis not present

## 2016-09-20 DIAGNOSIS — R2243 Localized swelling, mass and lump, lower limb, bilateral: Secondary | ICD-10-CM | POA: Diagnosis not present

## 2016-10-11 ENCOUNTER — Ambulatory Visit: Payer: Medicare Other | Admitting: Internal Medicine

## 2016-10-17 DIAGNOSIS — M353 Polymyalgia rheumatica: Secondary | ICD-10-CM | POA: Diagnosis not present

## 2016-10-17 DIAGNOSIS — M791 Myalgia: Secondary | ICD-10-CM | POA: Diagnosis not present

## 2016-10-17 DIAGNOSIS — M2550 Pain in unspecified joint: Secondary | ICD-10-CM | POA: Diagnosis not present

## 2016-10-26 ENCOUNTER — Ambulatory Visit: Payer: Medicare Other | Admitting: Nurse Practitioner

## 2016-10-28 ENCOUNTER — Emergency Department (HOSPITAL_COMMUNITY)
Admission: EM | Admit: 2016-10-28 | Discharge: 2016-10-28 | Disposition: A | Discharge status: Home / Self Care | Payer: Medicare Other | Attending: Emergency Medicine | Admitting: Emergency Medicine

## 2016-10-28 ENCOUNTER — Encounter (HOSPITAL_COMMUNITY): Payer: Self-pay | Admitting: Emergency Medicine

## 2016-10-28 ENCOUNTER — Emergency Department (HOSPITAL_COMMUNITY): Payer: Medicare Other

## 2016-10-28 DIAGNOSIS — I10 Essential (primary) hypertension: Secondary | ICD-10-CM | POA: Diagnosis not present

## 2016-10-28 DIAGNOSIS — Z7982 Long term (current) use of aspirin: Secondary | ICD-10-CM | POA: Insufficient documentation

## 2016-10-28 DIAGNOSIS — Z96651 Presence of right artificial knee joint: Secondary | ICD-10-CM | POA: Diagnosis not present

## 2016-10-28 DIAGNOSIS — Z79899 Other long term (current) drug therapy: Secondary | ICD-10-CM | POA: Diagnosis not present

## 2016-10-28 DIAGNOSIS — J45909 Unspecified asthma, uncomplicated: Secondary | ICD-10-CM | POA: Diagnosis not present

## 2016-10-28 DIAGNOSIS — Z7722 Contact with and (suspected) exposure to environmental tobacco smoke (acute) (chronic): Secondary | ICD-10-CM | POA: Insufficient documentation

## 2016-10-28 DIAGNOSIS — Z7901 Long term (current) use of anticoagulants: Secondary | ICD-10-CM | POA: Insufficient documentation

## 2016-10-28 DIAGNOSIS — I2510 Atherosclerotic heart disease of native coronary artery without angina pectoris: Secondary | ICD-10-CM | POA: Insufficient documentation

## 2016-10-28 DIAGNOSIS — R202 Paresthesia of skin: Secondary | ICD-10-CM | POA: Insufficient documentation

## 2016-10-28 DIAGNOSIS — R200 Anesthesia of skin: Secondary | ICD-10-CM | POA: Diagnosis not present

## 2016-10-28 LAB — HEPATIC FUNCTION PANEL
ALBUMIN: 3.1 g/dL — AB (ref 3.5–5.0)
ALK PHOS: 81 U/L (ref 38–126)
ALT: 15 U/L (ref 14–54)
AST: 20 U/L (ref 15–41)
BILIRUBIN TOTAL: 0.5 mg/dL (ref 0.3–1.2)
Bilirubin, Direct: <0.1 mg/dL — ABNORMAL LOW (ref 0.1–0.5)
Total Protein: 6.7 g/dL (ref 6.5–8.1)

## 2016-10-28 LAB — URINALYSIS, ROUTINE W REFLEX MICROSCOPIC
Bacteria, UA: NONE SEEN
Bilirubin Urine: NEGATIVE
GLUCOSE, UA: NEGATIVE mg/dL
Ketones, ur: NEGATIVE mg/dL
Leukocytes, UA: NEGATIVE
Nitrite: NEGATIVE
PH: 5 (ref 5.0–8.0)
Protein, ur: NEGATIVE mg/dL
RBC / HPF: NONE SEEN RBC/hpf (ref 0–5)
SPECIFIC GRAVITY, URINE: 1.004 — AB (ref 1.005–1.030)
SQUAMOUS EPITHELIAL / LPF: NONE SEEN
WBC, UA: NONE SEEN WBC/hpf (ref 0–5)

## 2016-10-28 LAB — BASIC METABOLIC PANEL
ANION GAP: 7 (ref 5–15)
BUN: 15 mg/dL (ref 6–20)
CHLORIDE: 105 mmol/L (ref 101–111)
CO2: 28 mmol/L (ref 22–32)
Calcium: 9.2 mg/dL (ref 8.9–10.3)
Creatinine, Ser: 1.25 mg/dL — ABNORMAL HIGH (ref 0.44–1.00)
GFR calc non Af Amer: 41 mL/min — ABNORMAL LOW (ref >60)
GFR, EST AFRICAN AMERICAN: 48 mL/min — AB (ref >60)
GLUCOSE: 120 mg/dL — AB (ref 65–99)
POTASSIUM: 3.6 mmol/L (ref 3.5–5.1)
Sodium: 140 mmol/L (ref 135–145)

## 2016-10-28 LAB — CBC
HCT: 39.8 % (ref 36.0–46.0)
Hemoglobin: 13 g/dL (ref 12.0–15.0)
MCH: 28.5 pg (ref 26.0–34.0)
MCHC: 32.7 g/dL (ref 30.0–36.0)
MCV: 87.3 fL (ref 78.0–100.0)
PLATELETS: 252 10*3/uL (ref 150–400)
RBC: 4.56 MIL/uL (ref 3.87–5.11)
RDW: 14.3 % (ref 11.5–15.5)
WBC: 8.2 10*3/uL (ref 4.0–10.5)

## 2016-10-28 LAB — CBG MONITORING, ED: Glucose-Capillary: 120 mg/dL — ABNORMAL HIGH (ref 65–99)

## 2016-10-28 NOTE — ED Notes (Signed)
Gave a cup of water for patient to drink per provider.

## 2016-10-28 NOTE — Discharge Instructions (Signed)
Your lab tests and head CT results were reassuring today. Please follow up with your primary care provider and your neurologist for any further management of this issue. Return to the ED as needed.

## 2016-10-28 NOTE — ED Triage Notes (Signed)
Pt. Stated, I've had some numbness all week off and on all week on the left side. Like this morning my left hand felt tight to open.
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
## 2016-11-10 ENCOUNTER — Other Ambulatory Visit: Payer: Self-pay | Admitting: Internal Medicine
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
## 2016-11-19 ENCOUNTER — Ambulatory Visit (INDEPENDENT_AMBULATORY_CARE_PROVIDER_SITE_OTHER): Payer: Medicare Other | Admitting: Internal Medicine

## 2016-11-19 ENCOUNTER — Encounter: Payer: Self-pay | Admitting: Internal Medicine

## 2016-11-19 ENCOUNTER — Other Ambulatory Visit (INDEPENDENT_AMBULATORY_CARE_PROVIDER_SITE_OTHER): Payer: Medicare Other

## 2016-11-19 VITALS — BP 130/76 | HR 90 | Temp 98.3°F | Resp 16 | Wt 205.0 lb

## 2016-11-19 DIAGNOSIS — F32A Depression, unspecified: Secondary | ICD-10-CM

## 2016-11-19 DIAGNOSIS — F419 Anxiety disorder, unspecified: Secondary | ICD-10-CM | POA: Diagnosis not present

## 2016-11-19 DIAGNOSIS — R7303 Prediabetes: Secondary | ICD-10-CM

## 2016-11-19 DIAGNOSIS — E785 Hyperlipidemia, unspecified: Secondary | ICD-10-CM | POA: Diagnosis not present

## 2016-11-19 DIAGNOSIS — K219 Gastro-esophageal reflux disease without esophagitis: Secondary | ICD-10-CM | POA: Diagnosis not present

## 2016-11-19 DIAGNOSIS — I10 Essential (primary) hypertension: Secondary | ICD-10-CM | POA: Diagnosis not present

## 2016-11-19 DIAGNOSIS — R600 Localized edema: Secondary | ICD-10-CM

## 2016-11-19 DIAGNOSIS — F329 Major depressive disorder, single episode, unspecified: Secondary | ICD-10-CM | POA: Diagnosis not present

## 2016-11-19 DIAGNOSIS — M353 Polymyalgia rheumatica: Secondary | ICD-10-CM

## 2016-11-19 LAB — COMPREHENSIVE METABOLIC PANEL
ALBUMIN: 3.5 g/dL (ref 3.5–5.2)
ALT: 10 U/L (ref 0–35)
AST: 13 U/L (ref 0–37)
Alkaline Phosphatase: 85 U/L (ref 39–117)
BILIRUBIN TOTAL: 0.5 mg/dL (ref 0.2–1.2)
BUN: 13 mg/dL (ref 6–23)
CALCIUM: 9.1 mg/dL (ref 8.4–10.5)
CHLORIDE: 103 meq/L (ref 96–112)
CO2: 27 meq/L (ref 19–32)
CREATININE: 1.07 mg/dL (ref 0.40–1.20)
GFR: 64.26 mL/min (ref >60.00)
Glucose, Bld: 99 mg/dL (ref 70–99)
Potassium: 3.6 mEq/L (ref 3.5–5.1)
Sodium: 138 mEq/L (ref 135–145)
Total Protein: 7.5 g/dL (ref 6.0–8.3)

## 2016-11-19 LAB — HEMOGLOBIN A1C: Hgb A1c MFr Bld: 6.2 % (ref 4.6–6.5)

## 2016-11-19 NOTE — Assessment & Plan Note (Signed)
Controlled Continue regular exercise Continue lasix 40 mg daily

## 2016-11-19 NOTE — Assessment & Plan Note (Signed)
BP well controlled Current regimen effective and well tolerated Continue current medications at current doses  

## 2016-11-19 NOTE — Assessment & Plan Note (Signed)
Check a1c Low sugar / carb diet Stressed regular exercise, weight loss  

## 2016-11-19 NOTE — Assessment & Plan Note (Addendum)
Controlled, stable Continue current dose of medication - buspar  

## 2016-11-19 NOTE — Assessment & Plan Note (Signed)
GERD controlled Continue daily medication  

## 2016-11-19 NOTE — Assessment & Plan Note (Signed)
Controlled, stable Continue current dose of medication  

## 2016-11-19 NOTE — Patient Instructions (Addendum)
  Test(s) ordered today. Your results will be released to [place] (or called to you) after review, usually within 72hours after test completion. If any changes need to be made, you will be notified at that same time.  All other Health Maintenance issues reviewed.   All recommended immunizations and age-appropriate screenings are up-to-date or discussed.  No immunizations administered today.   Medications reviewed and updated.  No changes recommended at this time.   Please follow up as scheduled

## 2016-11-19 NOTE — Assessment & Plan Note (Signed)
Weaned off prednisone No symptoms - muscle feel good

## 2016-11-19 NOTE — Assessment & Plan Note (Signed)
Check lipid panel  Continue daily statin Regular exercise and healthy diet encouraged  

## 2016-11-20 ENCOUNTER — Encounter: Payer: Self-pay | Admitting: Emergency Medicine

## 2016-11-23 ENCOUNTER — Ambulatory Visit (INDEPENDENT_AMBULATORY_CARE_PROVIDER_SITE_OTHER): Payer: Medicare Other | Admitting: *Deleted

## 2016-11-23 VITALS — BP 134/80 | HR 74 | Resp 20 | Ht 63.0 in | Wt 204.0 lb

## 2016-11-23 DIAGNOSIS — Z0000 Encounter for general adult medical examination without abnormal findings: Secondary | ICD-10-CM | POA: Diagnosis not present

## 2016-11-23 DIAGNOSIS — Z23 Encounter for immunization: Secondary | ICD-10-CM | POA: Diagnosis not present
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
## 2016-11-23 NOTE — Progress Notes (Signed)
Pre visit review using our clinic review tool, if applicable. No additional management support is needed unless otherwise documented below in the visit note. 

## 2016-11-23 NOTE — Patient Instructions (Addendum)
Continue to eat heart healthy diet (full of fruits, vegetables, whole grains, lean protein, water--limit salt, fat, and sugar intake) and increase physical activity as tolerated.  Continue doing brain stimulating activities (puzzles, reading, adult coloring books, staying active) to keep memory sharp.    [name] , Thank you for taking time to come for your Medicare Wellness Visit. I appreciate your ongoing commitment to your health goals. Please review the following plan we discussed and let me know if I can assist you in the future.   These are the goals we discussed: Goals    . I would like to be more involve with activities and vounteer       This is a list of the screening recommended for you and due dates:  Health Maintenance  Topic Date Due  . Tetanus Vaccine  04/02/2013  . Flu Shot  10/31/2016  . DEXA scan (bone density measurement)  02/07/2018  . Colon Cancer Screening  11/28/2020  . Pneumonia vaccines  Completed     Insomnia Insomnia is a sleep disorder that makes it difficult to fall asleep or to stay asleep. Insomnia can cause tiredness (fatigue), low energy, difficulty concentrating, mood swings, and poor performance at work or school. There are three different ways to classify insomnia:  Difficulty falling asleep.  Difficulty staying asleep.  Waking up too early in the morning.  Any type of insomnia can be long-term (chronic) or short-term (acute). Both are common. Short-term insomnia usually lasts for three months or less. Chronic insomnia occurs at least three times a week for longer than three months. What are the causes? Insomnia may be caused by another condition, situation, or substance, such as:  Anxiety.  Certain medicines.  Gastroesophageal reflux disease (GERD) or other gastrointestinal conditions.  Asthma or other breathing conditions.  Restless legs syndrome, sleep apnea, or other sleep disorders.  Chronic pain.  Menopause. This may  include hot flashes.  Stroke.  Abuse of alcohol, tobacco, or illegal drugs.  Depression.  Caffeine.  Neurological disorders, such as Alzheimer disease.  An overactive thyroid (hyperthyroidism).  The cause of insomnia may not be known. What increases the risk? Risk factors for insomnia include:  Gender. Women are more commonly affected than men.  Age. Insomnia is more common as you get older.  Stress. This may involve your professional or personal life.  Income. Insomnia is more common in people with lower income.  Lack of exercise.  Irregular work schedule or night shifts.  Traveling between different time zones.  What are the signs or symptoms? If you have insomnia, trouble falling asleep or trouble staying asleep is the main symptom. This may lead to other symptoms, such as:  Feeling fatigued.  Feeling nervous about going to sleep.  Not feeling rested in the morning.  Having trouble concentrating.  Feeling irritable, anxious, or depressed.  How is this treated? Treatment for insomnia depends on the cause. If your insomnia is caused by an underlying condition, treatment will focus on addressing the condition. Treatment may also include:  Medicines to help you sleep.  Counseling or therapy.  Lifestyle adjustments.  Follow these instructions at home:  Take medicines only as directed by your health care provider.  Keep regular sleeping and waking hours. Avoid naps.  Keep a sleep diary to help you and your health care provider figure out what could be causing your insomnia. Include: ? When you sleep. ? When you wake up during the night. ? How well you sleep. ?  How rested you feel the next day. ? Any side effects of medicines you are taking. ? What you eat and drink.  Make your bedroom a comfortable place where it is easy to fall asleep: ? Put up shades or special blackout curtains to block light from outside. ? Use a white noise machine to block  noise. ? Keep the temperature cool.  Exercise regularly as directed by your health care provider. Avoid exercising right before bedtime.  Use relaxation techniques to manage stress. Ask your health care provider to suggest some techniques that may work well for you. These may include: ? Breathing exercises. ? Routines to release muscle tension. ? Visualizing peaceful scenes.  Cut back on alcohol, caffeinated beverages, and cigarettes, especially close to bedtime. These can disrupt your sleep.  Do not overeat or eat spicy foods right before bedtime. This can lead to digestive discomfort that can make it hard for you to sleep.  Limit screen use before bedtime. This includes: ? Watching TV. ? Using your smartphone, tablet, and computer.  Stick to a routine. This can help you fall asleep faster. Try to do a quiet activity, brush your teeth, and go to bed at the same time each night.  Get out of bed if you are still awake after 15 minutes of trying to sleep. Keep the lights down, but try reading or doing a quiet activity. When you feel sleepy, go back to bed.  Make sure that you drive carefully. Avoid driving if you feel very sleepy.  Keep all follow-up appointments as directed by your health care provider. This is important. Contact a health care provider if:  You are tired throughout the day or have trouble in your daily routine due to sleepiness.  You continue to have sleep problems or your sleep problems get worse. Get help right away if:  You have serious thoughts about hurting yourself or someone else. This information is not intended to replace advice given to you by your health care provider. Make sure you discuss any questions you have with your health care provider. Document Released: 03/16/2000 Document Revised: 08/19/2015 Document Reviewed: 12/18/2013 Elsevier Interactive Patient Education  [employer].

## 2016-11-26 DIAGNOSIS — I10 Essential (primary) hypertension: Secondary | ICD-10-CM | POA: Diagnosis not present

## 2016-11-26 DIAGNOSIS — E785 Hyperlipidemia, unspecified: Secondary | ICD-10-CM | POA: Diagnosis not present

## 2016-11-26 DIAGNOSIS — I252 Old myocardial infarction: Secondary | ICD-10-CM | POA: Diagnosis not present

## 2016-11-26 DIAGNOSIS — I25118 Atherosclerotic heart disease of native coronary artery with other forms of angina pectoris: Secondary | ICD-10-CM | POA: Diagnosis not present

## 2016-11-26 DIAGNOSIS — Z86711 Personal history of pulmonary embolism: Secondary | ICD-10-CM | POA: Diagnosis not present

## 2016-11-29 DIAGNOSIS — I10 Essential (primary) hypertension: Secondary | ICD-10-CM | POA: Diagnosis not present

## 2016-11-29 DIAGNOSIS — E785 Hyperlipidemia, unspecified: Secondary | ICD-10-CM | POA: Diagnosis not present

## 2016-12-12 ENCOUNTER — Other Ambulatory Visit: Payer: Self-pay | Admitting: Internal Medicine

## 2017-01-08 ENCOUNTER — Encounter: Payer: Self-pay | Admitting: Internal Medicine

## 2017-01-08 ENCOUNTER — Ambulatory Visit (INDEPENDENT_AMBULATORY_CARE_PROVIDER_SITE_OTHER): Payer: Medicare Other | Admitting: Internal Medicine

## 2017-01-08 VITALS — BP 104/66 | HR 79 | Temp 98.2°F | Resp 16 | Wt 204.0 lb

## 2017-01-08 DIAGNOSIS — R7303 Prediabetes: Secondary | ICD-10-CM | POA: Diagnosis not present

## 2017-01-08 DIAGNOSIS — M2550 Pain in unspecified joint: Secondary | ICD-10-CM

## 2017-01-08 DIAGNOSIS — I10 Essential (primary) hypertension: Secondary | ICD-10-CM | POA: Diagnosis not present

## 2017-01-08 DIAGNOSIS — M159 Polyosteoarthritis, unspecified: Secondary | ICD-10-CM | POA: Insufficient documentation

## 2017-01-08 NOTE — Assessment & Plan Note (Addendum)
Having increased joint pain over last week - has gotten better ? Fibromyalgia flare - no concerning signs on exam Treat symptomatically Restart regular exercise Call if no improvement

## 2017-01-08 NOTE — Assessment & Plan Note (Signed)
BP well controlled Current regimen effective and well tolerated Continue current medications at current doses  

## 2017-01-08 NOTE — Assessment & Plan Note (Signed)
Lab Results  Component Value Date   HGBA1C 6.2 11/19/2016   Continue low sugar/carb diet Continue regular exercise

## 2017-01-08 NOTE — Patient Instructions (Addendum)
  No immunizations administered today.   Medications reviewed and updated.  No changes recommended at this time.    Please followup in 6 months   
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
## 2017-02-13 ENCOUNTER — Ambulatory Visit: Payer: Medicare Other | Admitting: Internal Medicine

## 2017-02-20 ENCOUNTER — Telehealth: Payer: Self-pay | Admitting: Internal Medicine

## 2017-02-20 NOTE — Telephone Encounter (Signed)
I have received disability discharge forms from [place]. I have called and LVM for patient to call back and discusses.   Dr.Burns are you aware of thses forms, is this something you would approve?

## 2017-02-21 NOTE — Telephone Encounter (Signed)
I saw the form.  I am not sure why she needs disability - I may not approve it - we need to get more information first.

## 2017-02-25 DIAGNOSIS — E785 Hyperlipidemia, unspecified: Secondary | ICD-10-CM | POA: Diagnosis not present

## 2017-02-25 DIAGNOSIS — Z86711 Personal history of pulmonary embolism: Secondary | ICD-10-CM | POA: Diagnosis not present

## 2017-02-25 DIAGNOSIS — I25118 Atherosclerotic heart disease of native coronary artery with other forms of angina pectoris: Secondary | ICD-10-CM | POA: Diagnosis not present

## 2017-02-25 DIAGNOSIS — I10 Essential (primary) hypertension: Secondary | ICD-10-CM | POA: Diagnosis not present

## 2017-02-25 DIAGNOSIS — I252 Old myocardial infarction: Secondary | ICD-10-CM | POA: Diagnosis not present

## 2017-02-25 NOTE — Telephone Encounter (Signed)
I have called and LVM again for patient to call back and discuss to find out more information.

## 2017-02-27 ENCOUNTER — Other Ambulatory Visit: Payer: Self-pay | Admitting: Internal Medicine

## 2017-02-28 NOTE — Telephone Encounter (Signed)
Called and spoke with the patient. She said that she has a school loan of $930. She is requesting this form be completed so that they can take the bill away incase something were to happen to her, she would not want her children to have to deal with paying for it. She explained to me that her "disability" is that she is sick due to heart issues and epilepsy.

## 2017-03-04 NOTE — Telephone Encounter (Signed)
Yes, it is ok

## 2017-03-04 NOTE — Telephone Encounter (Signed)
Please advise if this is ok with Dr. Quay Burow?

## 2017-03-06 ENCOUNTER — Telehealth: Payer: Self-pay | Admitting: Internal Medicine

## 2017-03-06 NOTE — Telephone Encounter (Signed)
Copied from [facility]. Topic: Inquiry >> Mar 06, 2017 12:03 PM Neva Seat wrote: Pt is asking if the form regarding her school loan has been filled out.  She states she can come by office to pick it up when it is ready.  Please call pt back.

## 2017-03-06 NOTE — Telephone Encounter (Signed)
Dr.Burns I am unclear on how to move forward on the forms. I am going to give you the forms to advise on or fill out. Thank you.

## 2017-03-06 NOTE — Telephone Encounter (Signed)
Placed forms in MD's box.

## 2017-03-06 NOTE — Telephone Encounter (Signed)
See phone encounter 11/21.

## 2017-03-07 NOTE — Telephone Encounter (Signed)
Please call her and let her know neurology and cardiology have never mentioned any limitations in her notes and we can not fill out the form.

## 2017-03-07 NOTE — Telephone Encounter (Signed)
I called 2x times. And it just rings for minutes, no VM will pick up.   Patient needs to be informed that we are unable to completed the form. After reviewing the form, we have no mention that she has any kind of limitations from any of her doctors. I will place the form up front for her to pick up. If she would like it mailed to her please advise. Thank you.

## 2017-03-18 NOTE — Telephone Encounter (Signed)
Patient called back about the forms. I explained the information to her. She is going to ask another one of doctors to complete. She asked the form be mailed back.

## 2017-03-28 ENCOUNTER — Other Ambulatory Visit: Payer: Self-pay | Admitting: Internal Medicine

## 2017-04-26 ENCOUNTER — Encounter (HOSPITAL_COMMUNITY): Payer: Self-pay

## 2017-04-26 ENCOUNTER — Emergency Department (HOSPITAL_COMMUNITY): Payer: Medicare Other

## 2017-04-26 DIAGNOSIS — I2510 Atherosclerotic heart disease of native coronary artery without angina pectoris: Secondary | ICD-10-CM | POA: Insufficient documentation

## 2017-04-26 DIAGNOSIS — Z79899 Other long term (current) drug therapy: Secondary | ICD-10-CM | POA: Insufficient documentation

## 2017-04-26 DIAGNOSIS — Z7722 Contact with and (suspected) exposure to environmental tobacco smoke (acute) (chronic): Secondary | ICD-10-CM | POA: Diagnosis not present

## 2017-04-26 DIAGNOSIS — M79621 Pain in right upper arm: Secondary | ICD-10-CM | POA: Diagnosis not present

## 2017-04-26 DIAGNOSIS — I129 Hypertensive chronic kidney disease with stage 1 through stage 4 chronic kidney disease, or unspecified chronic kidney disease: Secondary | ICD-10-CM | POA: Insufficient documentation

## 2017-04-26 DIAGNOSIS — N183 Chronic kidney disease, stage 3 (moderate): Secondary | ICD-10-CM | POA: Diagnosis not present

## 2017-04-26 DIAGNOSIS — Z7982 Long term (current) use of aspirin: Secondary | ICD-10-CM | POA: Insufficient documentation

## 2017-04-26 DIAGNOSIS — Z7901 Long term (current) use of anticoagulants: Secondary | ICD-10-CM | POA: Insufficient documentation

## 2017-04-26 DIAGNOSIS — M79601 Pain in right arm: Secondary | ICD-10-CM | POA: Diagnosis not present

## 2017-04-26 DIAGNOSIS — J45909 Unspecified asthma, uncomplicated: Secondary | ICD-10-CM | POA: Diagnosis not present

## 2017-04-26 DIAGNOSIS — Z96651 Presence of right artificial knee joint: Secondary | ICD-10-CM | POA: Insufficient documentation

## 2017-04-26 DIAGNOSIS — R0789 Other chest pain: Secondary | ICD-10-CM | POA: Diagnosis not present

## 2017-04-26 DIAGNOSIS — Z8673 Personal history of transient ischemic attack (TIA), and cerebral infarction without residual deficits: Secondary | ICD-10-CM | POA: Insufficient documentation

## 2017-04-26 DIAGNOSIS — R079 Chest pain, unspecified: Secondary | ICD-10-CM | POA: Diagnosis not present

## 2017-04-26 LAB — CBC
HCT: 42 % (ref 36.0–46.0)
HEMOGLOBIN: 13.4 g/dL (ref 12.0–15.0)
MCH: 28.9 pg (ref 26.0–34.0)
MCHC: 31.9 g/dL (ref 30.0–36.0)
MCV: 90.7 fL (ref 78.0–100.0)
PLATELETS: 252 10*3/uL (ref 150–400)
RBC: 4.63 MIL/uL (ref 3.87–5.11)
RDW: 14.6 % (ref 11.5–15.5)
WBC: 8.9 10*3/uL (ref 4.0–10.5)

## 2017-04-26 LAB — BASIC METABOLIC PANEL
ANION GAP: 10 (ref 5–15)
BUN: 14 mg/dL (ref 6–20)
CALCIUM: 8.9 mg/dL (ref 8.9–10.3)
CO2: 25 mmol/L (ref 22–32)
CREATININE: 1.19 mg/dL — AB (ref 0.44–1.00)
Chloride: 102 mmol/L (ref 101–111)
GFR calc Af Amer: 50 mL/min — ABNORMAL LOW (ref >60)
GFR, EST NON AFRICAN AMERICAN: 44 mL/min — AB (ref >60)
GLUCOSE: 108 mg/dL — AB (ref 65–99)
Potassium: 4.1 mmol/L (ref 3.5–5.1)
Sodium: 137 mmol/L (ref 135–145)

## 2017-04-26 LAB — I-STAT TROPONIN, ED: TROPONIN I, POC: 0 ng/mL (ref 0.00–0.08)

## 2017-04-26 NOTE — ED Triage Notes (Signed)
Pt states that this afternoon she began to have R arm pain, and CP on the right side, denies SOB/n/v. Pt states that she took nitro without relief

## 2017-04-27 ENCOUNTER — Emergency Department (HOSPITAL_COMMUNITY)
Admission: EM | Admit: 2017-04-27 | Discharge: 2017-04-27 | Disposition: A | Discharge status: Home / Self Care | Payer: Medicare Other | Attending: Emergency Medicine | Admitting: Emergency Medicine

## 2017-04-27 DIAGNOSIS — M79621 Pain in right upper arm: Secondary | ICD-10-CM | POA: Diagnosis not present

## 2017-04-27 DIAGNOSIS — M79601 Pain in right arm: Secondary | ICD-10-CM

## 2017-04-27 LAB — I-STAT TROPONIN, ED: TROPONIN I, POC: 0 ng/mL (ref 0.00–0.08)

## 2017-04-27 MED ORDER — IBUPROFEN 600 MG PO TABS: 600.0000 mg | ORAL_TABLET | Freq: Three times a day (TID) | ORAL | 0 refills | Status: DC | PRN
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
## 2017-05-29 ENCOUNTER — Other Ambulatory Visit: Payer: Self-pay | Admitting: Internal Medicine

## 2017-06-19 DIAGNOSIS — H40023 Open angle with borderline findings, high risk, bilateral: Secondary | ICD-10-CM | POA: Diagnosis not present

## 2017-06-19 DIAGNOSIS — H35371 Puckering of macula, right eye: Secondary | ICD-10-CM | POA: Diagnosis not present

## 2017-06-19 DIAGNOSIS — H43813 Vitreous degeneration, bilateral: Secondary | ICD-10-CM | POA: Diagnosis not present

## 2017-06-19 DIAGNOSIS — H35033 Hypertensive retinopathy, bilateral: Secondary | ICD-10-CM | POA: Diagnosis not present
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
## 2017-07-09 ENCOUNTER — Encounter: Payer: Medicare Other | Admitting: Internal Medicine

## 2017-07-09 DIAGNOSIS — Z0289 Encounter for other administrative examinations: Secondary | ICD-10-CM

## 2017-07-22 DIAGNOSIS — I25118 Atherosclerotic heart disease of native coronary artery with other forms of angina pectoris: Secondary | ICD-10-CM | POA: Diagnosis not present

## 2017-07-22 DIAGNOSIS — I252 Old myocardial infarction: Secondary | ICD-10-CM | POA: Diagnosis not present

## 2017-07-22 DIAGNOSIS — I10 Essential (primary) hypertension: Secondary | ICD-10-CM | POA: Diagnosis not present

## 2017-07-22 DIAGNOSIS — E785 Hyperlipidemia, unspecified: Secondary | ICD-10-CM | POA: Diagnosis not present

## 2017-07-22 DIAGNOSIS — Z86711 Personal history of pulmonary embolism: Secondary | ICD-10-CM | POA: Diagnosis not present

## 2017-07-26 DIAGNOSIS — I252 Old myocardial infarction: Secondary | ICD-10-CM | POA: Diagnosis not present

## 2017-07-26 DIAGNOSIS — I10 Essential (primary) hypertension: Secondary | ICD-10-CM | POA: Diagnosis not present

## 2017-07-26 DIAGNOSIS — E785 Hyperlipidemia, unspecified: Secondary | ICD-10-CM | POA: Diagnosis not present

## 2017-07-26 DIAGNOSIS — Z86711 Personal history of pulmonary embolism: Secondary | ICD-10-CM | POA: Diagnosis not present

## 2017-07-26 DIAGNOSIS — I25118 Atherosclerotic heart disease of native coronary artery with other forms of angina pectoris: Secondary | ICD-10-CM | POA: Diagnosis not present

## 2017-08-09 ENCOUNTER — Other Ambulatory Visit: Payer: Self-pay | Admitting: Internal Medicine

## 2017-08-13 ENCOUNTER — Other Ambulatory Visit: Payer: Self-pay | Admitting: Internal Medicine

## 2017-08-13 DIAGNOSIS — Z1231 Encounter for screening mammogram for malignant neoplasm of breast: Secondary | ICD-10-CM

## 2017-08-22 ENCOUNTER — Other Ambulatory Visit: Payer: Self-pay | Admitting: Internal Medicine

## 2017-08-31 ENCOUNTER — Other Ambulatory Visit: Payer: Self-pay | Admitting: Internal Medicine

## 2017-09-03 ENCOUNTER — Ambulatory Visit
Admission: RE | Admit: 2017-09-03 | Discharge: 2017-09-03 | Disposition: A | Discharge status: Home / Self Care | Payer: Medicare Other | Source: Ambulatory Visit | Attending: Internal Medicine | Admitting: Internal Medicine

## 2017-09-03 DIAGNOSIS — Z1231 Encounter for screening mammogram for malignant neoplasm of breast: Secondary | ICD-10-CM | POA: Diagnosis not present
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
## 2017-09-04 ENCOUNTER — Ambulatory Visit: Payer: Medicare Other | Admitting: Nurse Practitioner
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
## 2017-09-05 ENCOUNTER — Other Ambulatory Visit (INDEPENDENT_AMBULATORY_CARE_PROVIDER_SITE_OTHER): Payer: Medicare Other

## 2017-09-05 ENCOUNTER — Ambulatory Visit (INDEPENDENT_AMBULATORY_CARE_PROVIDER_SITE_OTHER): Payer: Medicare Other | Admitting: Internal Medicine

## 2017-09-05 ENCOUNTER — Encounter: Payer: Self-pay | Admitting: Internal Medicine

## 2017-09-05 ENCOUNTER — Encounter: Payer: Self-pay | Admitting: Nurse Practitioner

## 2017-09-05 VITALS — BP 116/64 | HR 70 | Temp 98.1°F | Resp 16 | Wt 196.0 lb

## 2017-09-05 DIAGNOSIS — R109 Unspecified abdominal pain: Secondary | ICD-10-CM

## 2017-09-05 DIAGNOSIS — R1031 Right lower quadrant pain: Secondary | ICD-10-CM | POA: Diagnosis not present

## 2017-09-05 LAB — COMPREHENSIVE METABOLIC PANEL
ALBUMIN: 3.8 g/dL (ref 3.5–5.2)
ALK PHOS: 89 U/L (ref 39–117)
ALT: 10 U/L (ref 0–35)
AST: 13 U/L (ref 0–37)
BILIRUBIN TOTAL: 0.3 mg/dL (ref 0.2–1.2)
BUN: 21 mg/dL (ref 6–23)
CALCIUM: 9.8 mg/dL (ref 8.4–10.5)
CO2: 27 mEq/L (ref 19–32)
CREATININE: 1.29 mg/dL — AB (ref 0.40–1.20)
Chloride: 104 mEq/L (ref 96–112)
GFR: 51.68 mL/min — ABNORMAL LOW (ref >60.00)
Glucose, Bld: 108 mg/dL — ABNORMAL HIGH (ref 70–99)
Potassium: 4.9 mEq/L (ref 3.5–5.1)
Sodium: 138 mEq/L (ref 135–145)
TOTAL PROTEIN: 7.4 g/dL (ref 6.0–8.3)

## 2017-09-05 LAB — URINALYSIS, ROUTINE W REFLEX MICROSCOPIC
BILIRUBIN URINE: NEGATIVE
KETONES UR: NEGATIVE
Leukocytes, UA: NEGATIVE
Nitrite: NEGATIVE
SPECIFIC GRAVITY, URINE: 1.025 (ref 1.000–1.030)
Total Protein, Urine: NEGATIVE
UROBILINOGEN UA: 0.2 (ref 0.0–1.0)
Urine Glucose: NEGATIVE
pH: 5.5 (ref 5.0–8.0)

## 2017-09-05 LAB — CBC WITH DIFFERENTIAL/PLATELET
BASOS ABS: 0.1 10*3/uL (ref 0.0–0.1)
Basophils Relative: 0.9 % (ref 0.0–3.0)
EOS ABS: 0.1 10*3/uL (ref 0.0–0.7)
Eosinophils Relative: 1.3 % (ref 0.0–5.0)
HEMATOCRIT: 40.5 % (ref 36.0–46.0)
HEMOGLOBIN: 13.4 g/dL (ref 12.0–15.0)
LYMPHS PCT: 30 % (ref 12.0–46.0)
Lymphs Abs: 2.2 10*3/uL (ref 0.7–4.0)
MCHC: 33 g/dL (ref 30.0–36.0)
MCV: 88.8 fl (ref 78.0–100.0)
MONOS PCT: 8.5 % (ref 3.0–12.0)
Monocytes Absolute: 0.6 10*3/uL (ref 0.1–1.0)
Neutro Abs: 4.4 10*3/uL (ref 1.4–7.7)
Neutrophils Relative %: 59.3 % (ref 43.0–77.0)
Platelets: 269 10*3/uL (ref 150.0–400.0)
RBC: 4.56 Mil/uL (ref 3.87–5.11)
RDW: 15.2 % (ref 11.5–15.5)
WBC: 7.4 10*3/uL (ref 4.0–10.5)

## 2017-09-05 NOTE — Patient Instructions (Signed)
  Test(s) ordered today. Your results will be released to [facility] (or called to you) after review, usually within 72hours after test completion. If any changes need to be made, you will be notified at that same time.   Medications reviewed and updated.  No changes recommended at this time.  A Ct scan was ordered - someone will call you to schedule this.

## 2017-09-05 NOTE — Assessment & Plan Note (Signed)
Right flank and right lower quadrant pain radiating to pubic area Pain is intermittent and sharp, it getting worse No change in bowels or association with bowels.  No urinary symptoms No history of kidney stones No increase pain with movement ? Palpable mass in RLQ Concern for UTI or renal stone, possibly constipation, less likely adnexal issue or appendicitis Check CBC, CMP CT renal stone study Urinalysis, urine culture

## 2017-09-05 NOTE — Assessment & Plan Note (Addendum)
Right flank and right lower quadrant pain radiating to pubic area Pain is intermittent and sharp, it getting worse No change in bowels or association with bowels.  No urinary symptoms No history of kidney stones No increase pain with movement Concern for UTI or renal stone, possibly constipation, less likely adnexal issue or appendicitis Check CBC, CMP CT renal stone study Urinalysis, urine culture

## 2017-09-06 LAB — URINE CULTURE
MICRO NUMBER:: 90681412
Result:: NO GROWTH
SPECIMEN QUALITY: ADEQUATE
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
## 2017-09-12 ENCOUNTER — Encounter: Payer: Self-pay | Admitting: Nurse Practitioner

## 2017-09-12 ENCOUNTER — Ambulatory Visit (INDEPENDENT_AMBULATORY_CARE_PROVIDER_SITE_OTHER): Payer: Medicare Other | Admitting: Nurse Practitioner

## 2017-09-12 ENCOUNTER — Other Ambulatory Visit: Payer: Self-pay | Admitting: Neurology

## 2017-09-12 ENCOUNTER — Other Ambulatory Visit: Payer: Self-pay | Admitting: Internal Medicine

## 2017-09-12 VITALS — BP 99/57 | HR 83 | Ht 63.0 in | Wt 195.0 lb

## 2017-09-12 DIAGNOSIS — R569 Unspecified convulsions: Secondary | ICD-10-CM | POA: Diagnosis not present

## 2017-09-12 DIAGNOSIS — G3184 Mild cognitive impairment, so stated: Secondary | ICD-10-CM

## 2017-09-12 DIAGNOSIS — F0390 Unspecified dementia without behavioral disturbance: Secondary | ICD-10-CM | POA: Insufficient documentation

## 2017-09-12 MED ORDER — LAMOTRIGINE ER 100 MG PO TB24: 200.0000 mg | ORAL_TABLET | Freq: Every day | ORAL | 3 refills | Status: DC

## 2017-09-12 NOTE — Patient Instructions (Signed)
Continue Lamictal at current dose Call for any seizure activity Memory score is stable 27/30 referred to research study Continue moderate exercise F/U in 6 months

## 2017-09-13 NOTE — Progress Notes (Signed)
I have reviewed and agreed above plan. 

## 2017-09-17 ENCOUNTER — Ambulatory Visit (INDEPENDENT_AMBULATORY_CARE_PROVIDER_SITE_OTHER)
Admission: RE | Admit: 2017-09-17 | Discharge: 2017-09-17 | Disposition: A | Discharge status: Home / Self Care | Payer: Medicare Other | Source: Ambulatory Visit | Attending: Internal Medicine | Admitting: Internal Medicine

## 2017-09-17 DIAGNOSIS — R1031 Right lower quadrant pain: Secondary | ICD-10-CM

## 2017-09-18 ENCOUNTER — Encounter: Payer: Self-pay | Admitting: Emergency Medicine

## 2017-09-18 ENCOUNTER — Encounter: Payer: Self-pay | Admitting: Internal Medicine

## 2017-09-18 DIAGNOSIS — N8110 Cystocele, unspecified: Secondary | ICD-10-CM | POA: Insufficient documentation

## 2017-09-19 ENCOUNTER — Other Ambulatory Visit: Payer: Self-pay | Admitting: Internal Medicine

## 2017-09-22 ENCOUNTER — Other Ambulatory Visit: Payer: Self-pay

## 2017-09-22 ENCOUNTER — Encounter (HOSPITAL_COMMUNITY): Payer: Self-pay | Admitting: Emergency Medicine

## 2017-09-22 ENCOUNTER — Emergency Department (HOSPITAL_COMMUNITY): Payer: Medicare Other

## 2017-09-22 ENCOUNTER — Emergency Department (HOSPITAL_COMMUNITY)
Admission: EM | Admit: 2017-09-22 | Discharge: 2017-09-22 | Disposition: A | Discharge status: Home / Self Care | Payer: Medicare Other | Attending: Emergency Medicine | Admitting: Emergency Medicine

## 2017-09-22 DIAGNOSIS — E785 Hyperlipidemia, unspecified: Secondary | ICD-10-CM | POA: Insufficient documentation

## 2017-09-22 DIAGNOSIS — Z7982 Long term (current) use of aspirin: Secondary | ICD-10-CM | POA: Insufficient documentation

## 2017-09-22 DIAGNOSIS — J45909 Unspecified asthma, uncomplicated: Secondary | ICD-10-CM | POA: Diagnosis not present

## 2017-09-22 DIAGNOSIS — Z8673 Personal history of transient ischemic attack (TIA), and cerebral infarction without residual deficits: Secondary | ICD-10-CM | POA: Insufficient documentation

## 2017-09-22 DIAGNOSIS — Z7722 Contact with and (suspected) exposure to environmental tobacco smoke (acute) (chronic): Secondary | ICD-10-CM | POA: Insufficient documentation

## 2017-09-22 DIAGNOSIS — I2510 Atherosclerotic heart disease of native coronary artery without angina pectoris: Secondary | ICD-10-CM | POA: Diagnosis not present

## 2017-09-22 DIAGNOSIS — K5900 Constipation, unspecified: Secondary | ICD-10-CM | POA: Insufficient documentation

## 2017-09-22 DIAGNOSIS — R1030 Lower abdominal pain, unspecified: Secondary | ICD-10-CM | POA: Diagnosis present

## 2017-09-22 DIAGNOSIS — I10 Essential (primary) hypertension: Secondary | ICD-10-CM | POA: Insufficient documentation

## 2017-09-22 DIAGNOSIS — Z79899 Other long term (current) drug therapy: Secondary | ICD-10-CM | POA: Diagnosis not present

## 2017-09-22 DIAGNOSIS — R1031 Right lower quadrant pain: Secondary | ICD-10-CM | POA: Diagnosis not present

## 2017-09-22 LAB — CBC
HEMATOCRIT: 44.7 % (ref 36.0–46.0)
HEMOGLOBIN: 13.4 g/dL (ref 12.0–15.0)
MCH: 28.6 pg (ref 26.0–34.0)
MCHC: 30 g/dL (ref 30.0–36.0)
MCV: 95.3 fL (ref 78.0–100.0)
Platelets: 250 10*3/uL (ref 150–400)
RBC: 4.69 MIL/uL (ref 3.87–5.11)
RDW: 14.5 % (ref 11.5–15.5)
WBC: 6.7 10*3/uL (ref 4.0–10.5)

## 2017-09-22 LAB — URINALYSIS, ROUTINE W REFLEX MICROSCOPIC
BACTERIA UA: NONE SEEN
Bilirubin Urine: NEGATIVE
Glucose, UA: NEGATIVE mg/dL
Ketones, ur: NEGATIVE mg/dL
Leukocytes, UA: NEGATIVE
NITRITE: NEGATIVE
Protein, ur: NEGATIVE mg/dL
SPECIFIC GRAVITY, URINE: 1.01 (ref 1.005–1.030)
pH: 5 (ref 5.0–8.0)

## 2017-09-22 LAB — COMPREHENSIVE METABOLIC PANEL
ALBUMIN: 3.3 g/dL — AB (ref 3.5–5.0)
ALK PHOS: 99 U/L (ref 38–126)
ALT: 13 U/L — AB (ref 14–54)
ANION GAP: 8 (ref 5–15)
AST: 13 U/L — ABNORMAL LOW (ref 15–41)
BILIRUBIN TOTAL: 0.3 mg/dL (ref 0.3–1.2)
BUN: 18 mg/dL (ref 6–20)
CHLORIDE: 107 mmol/L (ref 101–111)
CO2: 23 mmol/L (ref 22–32)
Calcium: 9 mg/dL (ref 8.9–10.3)
Creatinine, Ser: 1.29 mg/dL — ABNORMAL HIGH (ref 0.44–1.00)
GFR calc Af Amer: 45 mL/min — ABNORMAL LOW (ref >60)
GFR calc non Af Amer: 39 mL/min — ABNORMAL LOW (ref >60)
GLUCOSE: 118 mg/dL — AB (ref 65–99)
POTASSIUM: 4.7 mmol/L (ref 3.5–5.1)
SODIUM: 138 mmol/L (ref 135–145)
Total Protein: 7.3 g/dL (ref 6.5–8.1)

## 2017-09-22 LAB — LIPASE, BLOOD: Lipase: 37 U/L (ref 11–51)

## 2017-09-22 MED ORDER — POLYETHYLENE GLYCOL 3350 17 GM/SCOOP PO POWD: 1.0000 | Freq: Once | ORAL | 0 refills | Status: AC

## 2017-09-22 NOTE — Discharge Instructions (Signed)
Please use 8 capfuls of Miralax with 32 oz of fluid up to twice a day until you have normal bowel movements. Then continuu 1 capful per day.

## 2017-09-22 NOTE — ED Triage Notes (Signed)
C/o RLQ pain that radiates to R flank x 2-3 months.  States she had CT scan last week and was told something was wrong with her bladder but she is unsure of results.  Denies urinary complaints, nausea, and vomiting.

## 2017-09-22 NOTE — ED Notes (Signed)
Patient transported to X-ray 
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
## 2017-09-26 ENCOUNTER — Telehealth: Payer: Self-pay | Admitting: Nurse Practitioner

## 2017-09-26 NOTE — Telephone Encounter (Signed)
Pts daughter Andee Poles requesting a call wanting to discuss he mother's symptoms & previous office visit. Danielle didn't want to go into full detail with me but stating her mother has become more forgetful. Please call to advise.

## 2017-09-26 NOTE — Telephone Encounter (Signed)
I called and spoke to pt, she relayed did not want to do research study (has heart condition and concerned about medications she may have to take).  She wanted me to let research know and also Dr. Krista Blue.  I would relay.  I then called and spoke to her daughter, Andee Poles (on [place]), she was not with her mother when she came in to the visit on 09-02-17.  She is wanting to speak to Carolyn,NP about the diagnosis -dementia? ([facility]) that was given to pt. I would forward to CM/NP [phone] to please call.

## 2017-09-26 NOTE — Telephone Encounter (Signed)
Called daughter and discussed last visit

## 2017-09-28 ENCOUNTER — Other Ambulatory Visit: Payer: Self-pay | Admitting: Internal Medicine

## 2017-10-01 DIAGNOSIS — M4807 Spinal stenosis, lumbosacral region: Secondary | ICD-10-CM | POA: Diagnosis not present

## 2017-10-17 ENCOUNTER — Other Ambulatory Visit: Payer: Self-pay | Admitting: Internal Medicine

## 2017-10-21 DIAGNOSIS — Z86711 Personal history of pulmonary embolism: Secondary | ICD-10-CM | POA: Diagnosis not present

## 2017-10-21 DIAGNOSIS — E785 Hyperlipidemia, unspecified: Secondary | ICD-10-CM | POA: Diagnosis not present

## 2017-10-21 DIAGNOSIS — I25118 Atherosclerotic heart disease of native coronary artery with other forms of angina pectoris: Secondary | ICD-10-CM | POA: Diagnosis not present

## 2017-10-21 DIAGNOSIS — I252 Old myocardial infarction: Secondary | ICD-10-CM | POA: Diagnosis not present

## 2017-10-21 DIAGNOSIS — I10 Essential (primary) hypertension: Secondary | ICD-10-CM | POA: Diagnosis not present

## 2017-10-22 DIAGNOSIS — E785 Hyperlipidemia, unspecified: Secondary | ICD-10-CM | POA: Diagnosis not present

## 2017-10-22 DIAGNOSIS — I10 Essential (primary) hypertension: Secondary | ICD-10-CM | POA: Diagnosis not present

## 2017-10-28 ENCOUNTER — Other Ambulatory Visit: Payer: Self-pay | Admitting: Internal Medicine

## 2017-10-31 ENCOUNTER — Other Ambulatory Visit: Payer: Self-pay | Admitting: Internal Medicine
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
## 2017-11-11 NOTE — Patient Instructions (Addendum)
  Test(s) ordered today. Your results will be released to [place] (or called to you) after review, usually within 72hours after test completion. If any changes need to be made, you will be notified at that same time.   Medications reviewed and updated.  Changes include:  1. We will stop the buspar 2.  Stop the nortriptyline  3.  Decrease pantoprazole to 20 mg daily  Your prescription(s) have been submitted to your pharmacy. Please take as directed and contact our office if you believe you are having problem(s) with the medication(s).    Please followup in 6 months

## 2017-11-12 ENCOUNTER — Encounter: Payer: Self-pay | Admitting: Internal Medicine

## 2017-11-12 ENCOUNTER — Ambulatory Visit (INDEPENDENT_AMBULATORY_CARE_PROVIDER_SITE_OTHER): Payer: Medicare Other | Admitting: Internal Medicine

## 2017-11-12 ENCOUNTER — Other Ambulatory Visit (INDEPENDENT_AMBULATORY_CARE_PROVIDER_SITE_OTHER): Payer: Medicare Other

## 2017-11-12 VITALS — BP 110/60 | HR 75 | Ht 63.0 in | Wt 194.0 lb

## 2017-11-12 DIAGNOSIS — R519 Headache, unspecified: Secondary | ICD-10-CM

## 2017-11-12 DIAGNOSIS — K219 Gastro-esophageal reflux disease without esophagitis: Secondary | ICD-10-CM | POA: Diagnosis not present

## 2017-11-12 DIAGNOSIS — G479 Sleep disorder, unspecified: Secondary | ICD-10-CM

## 2017-11-12 DIAGNOSIS — I10 Essential (primary) hypertension: Secondary | ICD-10-CM | POA: Diagnosis not present

## 2017-11-12 DIAGNOSIS — I2510 Atherosclerotic heart disease of native coronary artery without angina pectoris: Secondary | ICD-10-CM

## 2017-11-12 DIAGNOSIS — M797 Fibromyalgia: Secondary | ICD-10-CM

## 2017-11-12 DIAGNOSIS — N183 Chronic kidney disease, stage 3 unspecified: Secondary | ICD-10-CM

## 2017-11-12 DIAGNOSIS — F419 Anxiety disorder, unspecified: Secondary | ICD-10-CM

## 2017-11-12 DIAGNOSIS — R7303 Prediabetes: Secondary | ICD-10-CM

## 2017-11-12 DIAGNOSIS — E785 Hyperlipidemia, unspecified: Secondary | ICD-10-CM | POA: Diagnosis not present

## 2017-11-12 DIAGNOSIS — J45991 Cough variant asthma: Secondary | ICD-10-CM

## 2017-11-12 DIAGNOSIS — R51 Headache: Secondary | ICD-10-CM

## 2017-11-12 LAB — CBC WITH DIFFERENTIAL/PLATELET
BASOS ABS: 0 10*3/uL (ref 0.0–0.1)
Basophils Relative: 0.5 % (ref 0.0–3.0)
EOS ABS: 0.1 10*3/uL (ref 0.0–0.7)
Eosinophils Relative: 1.4 % (ref 0.0–5.0)
HCT: 41.4 % (ref 36.0–46.0)
Hemoglobin: 13.7 g/dL (ref 12.0–15.0)
LYMPHS ABS: 2.2 10*3/uL (ref 0.7–4.0)
Lymphocytes Relative: 37.3 % (ref 12.0–46.0)
MCHC: 33.1 g/dL (ref 30.0–36.0)
MCV: 89.8 fl (ref 78.0–100.0)
MONOS PCT: 8.5 % (ref 3.0–12.0)
Monocytes Absolute: 0.5 10*3/uL (ref 0.1–1.0)
NEUTROS ABS: 3 10*3/uL (ref 1.4–7.7)
NEUTROS PCT: 52.3 % (ref 43.0–77.0)
PLATELETS: 257 10*3/uL (ref 150.0–400.0)
RBC: 4.61 Mil/uL (ref 3.87–5.11)
RDW: 14.4 % (ref 11.5–15.5)
WBC: 5.8 10*3/uL (ref 4.0–10.5)

## 2017-11-12 LAB — COMPREHENSIVE METABOLIC PANEL
ALT: 13 U/L (ref 0–35)
AST: 12 U/L (ref 0–37)
Albumin: 3.9 g/dL (ref 3.5–5.2)
Alkaline Phosphatase: 93 U/L (ref 39–117)
BILIRUBIN TOTAL: 0.4 mg/dL (ref 0.2–1.2)
BUN: 17 mg/dL (ref 6–23)
CO2: 29 mEq/L (ref 19–32)
CREATININE: 1.33 mg/dL — AB (ref 0.40–1.20)
Calcium: 9.8 mg/dL (ref 8.4–10.5)
Chloride: 104 mEq/L (ref 96–112)
GFR: 49.86 mL/min — AB (ref >60.00)
GLUCOSE: 111 mg/dL — AB (ref 70–99)
Potassium: 4.9 mEq/L (ref 3.5–5.1)
Sodium: 139 mEq/L (ref 135–145)
TOTAL PROTEIN: 7.8 g/dL (ref 6.0–8.3)

## 2017-11-12 LAB — LIPID PANEL
CHOLESTEROL: 140 mg/dL (ref 0–200)
HDL: 44.6 mg/dL (ref >39.00)
LDL Cholesterol: 79 mg/dL (ref 0–99)
NonHDL: 95
Total CHOL/HDL Ratio: 3
Triglycerides: 81 mg/dL (ref 0.0–149.0)
VLDL: 16.2 mg/dL (ref 0.0–40.0)

## 2017-11-12 LAB — HEMOGLOBIN A1C: HEMOGLOBIN A1C: 6.2 % (ref 4.6–6.5)

## 2017-11-12 MED ORDER — PANTOPRAZOLE SODIUM 20 MG PO TBEC: 20.0000 mg | DELAYED_RELEASE_TABLET | Freq: Every day | ORAL | 5 refills | Status: DC

## 2017-11-12 MED ORDER — ALBUTEROL SULFATE HFA 108 (90 BASE) MCG/ACT IN AERS: INHALATION_SPRAY | RESPIRATORY_TRACT | 8 refills | Status: DC

## 2017-11-12 NOTE — Assessment & Plan Note (Addendum)
Uses albuterol as needed Taking singulair nightly controlled Continue above

## 2017-11-12 NOTE — Assessment & Plan Note (Signed)
Off of buspar for the past three weeks - denies anxiety Will hold - she will let me know if anxiety increases - can consider restarting it at 5 mg

## 2017-11-12 NOTE — Assessment & Plan Note (Addendum)
Takes fioricet as needed only - effective Continue as needed only

## 2017-11-12 NOTE — Assessment & Plan Note (Signed)
Taking Ambien only as needed Controlled Continue

## 2017-11-12 NOTE — Assessment & Plan Note (Signed)
BP well controlled Current regimen effective and well tolerated Continue current medications at current doses cmp  

## 2017-11-12 NOTE — Assessment & Plan Note (Signed)
Check a1c Low sugar / carb diet Stressed regular exercise, weight loss  

## 2017-11-12 NOTE — Assessment & Plan Note (Signed)
Drinks water throughout the day Avoids NSAIDs CMP

## 2017-11-12 NOTE — Assessment & Plan Note (Signed)
She denies chest pain, palpitations and shortness of breath Continue current medications Check CBC, CMP, lipids

## 2017-11-12 NOTE — Assessment & Plan Note (Addendum)
Taking nortriptyline nightly She is exercising regularly She wonders if she really needs the nortriptyline-we will try to discontinue, but if fibromyalgia pain or headaches worsen may need to restart

## 2017-11-12 NOTE — Assessment & Plan Note (Signed)
Takes protonix 40 mg daily No gerd Will try decrease to 20 mg daily.  If she has increased gerd will adjust medications

## 2017-11-13 ENCOUNTER — Ambulatory Visit: Payer: Medicare Other | Admitting: Pulmonary Disease

## 2017-11-13 ENCOUNTER — Encounter: Payer: Self-pay | Admitting: Pulmonary Disease

## 2017-11-13 NOTE — Telephone Encounter (Signed)
Error
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
## 2017-11-15 ENCOUNTER — Ambulatory Visit (INDEPENDENT_AMBULATORY_CARE_PROVIDER_SITE_OTHER): Payer: Medicare Other | Admitting: Internal Medicine

## 2017-11-15 ENCOUNTER — Encounter: Payer: Self-pay | Admitting: Internal Medicine

## 2017-11-15 VITALS — BP 122/68 | HR 91 | Temp 98.2°F | Resp 16 | Wt 195.0 lb

## 2017-11-15 DIAGNOSIS — S93401A Sprain of unspecified ligament of right ankle, initial encounter: Secondary | ICD-10-CM | POA: Diagnosis not present

## 2017-11-15 DIAGNOSIS — W19XXXA Unspecified fall, initial encounter: Secondary | ICD-10-CM | POA: Diagnosis not present

## 2017-11-15 DIAGNOSIS — M791 Myalgia, unspecified site: Secondary | ICD-10-CM | POA: Diagnosis not present

## 2017-11-15 NOTE — Assessment & Plan Note (Signed)
She fell yesterday at the gym-likely mechanical, possibly secondary to being distracted No concerning prodromal syndrome Discussed fall prevention

## 2017-11-15 NOTE — Assessment & Plan Note (Signed)
Muscular pain right side, bilateral upper arms and upper back secondary to fall yesterday at the gym Pain is improved since yesterday Overall pain is mild Tylenol as needed Heat, hot shower Call if no improvement

## 2017-11-15 NOTE — Patient Instructions (Addendum)
Take tylenol as needed.   Use ice on the right ankle.  Rest and elevate the foot.    Hot showers help with the muscle aches.     Call if no improvement       Ankle Sprain An ankle sprain is a stretch or tear in one of the tough, fiber-like tissues (ligaments) in the ankle. The ligaments in your ankle help to hold the bones of the ankle together. What are the causes? This condition is often caused by stepping on or falling on the outer edge of the foot. What increases the risk? This condition is more likely to develop in people who play sports. What are the signs or symptoms? Symptoms of this condition include:  Pain in your ankle.  Swelling.  Bruising. Bruising may develop right after you sprain your ankle or 1-2 days later.  Trouble standing or walking, especially when you turn or change directions.  How is this diagnosed? This condition is diagnosed with a physical exam. During the exam, your health care provider will press on certain parts of your foot and ankle and try to move them in certain ways. X-rays may be taken to see how severe the sprain is and to check for broken bones. How is this treated? This condition may be treated with:  A brace. This is used to keep the ankle from moving until it heals.  An elastic bandage. This is used to support the ankle.  Crutches.  Pain medicine.  Surgery. This may be needed if the sprain is severe.  Physical therapy. This may help to improve the range of motion in the ankle.  Follow these instructions at home:  Rest your ankle.  Take over-the-counter and prescription medicines only as told by your health care provider.  For 2-3 days, keep your ankle raised (elevated) above the level of your heart as much as possible.  If directed, apply ice to the area: ? Put ice in a plastic bag. ? Place a towel between your skin and the bag. ? Leave the ice on for 20 minutes, 2-3 times a day.  If you were given a brace: ? Wear  it as directed. ? Remove it to shower or bathe. ? Try not to move your ankle much, but wiggle your toes from time to time. This helps to prevent swelling.  If you were given an elastic bandage (dressing): ? Remove it to shower or bathe. ? Try not to move your ankle much, but wiggle your toes from time to time. This helps to prevent swelling. ? Adjust the dressing to make it more comfortable if it feels too tight. ? Loosen the dressing if you have numbness or tingling in your foot, or if your foot becomes cold and blue.  If you have crutches, use them as told by your health care provider. Continue to use them until you can walk without feeling pain in your ankle. Contact a health care provider if:  You have rapidly increasing bruising or swelling.  Your pain is not relieved with medicine. Get help right away if:  Your toes or foot becomes numb or blue.  You have severe pain that gets worse. This information is not intended to replace advice given to you by your health care provider. Make sure you discuss any questions you have with your health care provider. Document Released: 03/19/2005 Document Revised: 04/26/2016 Document Reviewed: 10/19/2014 Elsevier Interactive Patient Education  [employer].

## 2017-11-15 NOTE — Assessment & Plan Note (Signed)
Secondary to fall yesterday at the gym Overall mild in nature Continue to ice, rest and elevate Call if no improvement
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
## 2017-11-18 ENCOUNTER — Ambulatory Visit: Payer: Medicare Other | Admitting: Pulmonary Disease

## 2017-11-22 ENCOUNTER — Other Ambulatory Visit: Payer: Self-pay | Admitting: Internal Medicine

## 2017-11-26 ENCOUNTER — Ambulatory Visit: Payer: Medicare Other

## 2017-11-28 ENCOUNTER — Other Ambulatory Visit: Payer: Self-pay | Admitting: Internal Medicine
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
## 2017-12-16 ENCOUNTER — Ambulatory Visit (INDEPENDENT_AMBULATORY_CARE_PROVIDER_SITE_OTHER): Payer: Medicare Other | Admitting: *Deleted

## 2017-12-16 VITALS — BP 124/82 | HR 95 | Resp 17 | Ht 63.0 in | Wt 194.0 lb

## 2017-12-16 DIAGNOSIS — Z0000 Encounter for general adult medical examination without abnormal findings: Secondary | ICD-10-CM

## 2017-12-16 NOTE — Patient Instructions (Addendum)
Continue doing brain stimulating activities (puzzles, reading, adult coloring books, staying active) to keep memory sharp.   Continue to eat heart healthy diet (full of fruits, vegetables, whole grains, lean protein, water--limit salt, fat, and sugar intake) and increase physical activity as tolerated.  If you or someone you know has experienced the death of a loved one, the support of others can play an invaluable role in the healing process. [facility] offers grief and loss services for anyone in the community. Contact us today [phone]  Ms. Hadlock , Thank you for taking time to come for your Medicare Wellness Visit. I appreciate your ongoing commitment to your health goals. Please review the following plan we discussed and let me know if I can assist you in the future.   These are the goals we discussed: Goals    . I would like to be more involve with activities and volunteer in my church    . Patient Stated     Continue to do things to stimulate my mind and brain. 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       This is a list of the screening recommended for you and due dates:  Health Maintenance  Topic Date Due  . Flu Shot  10/31/2017  . Tetanus Vaccine  09/06/2018*  . DEXA scan (bone density measurement)  02/07/2018  . Colon Cancer Screening  11/28/2020  . Pneumonia vaccines  Completed  *Topic was postponed. The date shown is not the original due date.   Health Maintenance, Female Adopting a healthy lifestyle and getting preventive care can go a long way to promote health and wellness. Talk with your health care provider about what schedule of regular examinations is right for you. This is a good chance for you to check in with your provider about disease prevention and staying healthy. In between checkups, there are plenty of things you can do on your own. Experts have done a lot of research about which lifestyle changes and preventive measures  are most likely to keep you healthy. Ask your health care provider for more information. Weight and diet Eat a healthy diet  Be sure to include plenty of vegetables, fruits, low-fat dairy products, and lean protein.  Do not eat a lot of foods high in solid fats, added sugars, or salt.  Get regular exercise. This is one of the most important things you can do for your health. ? Most adults should exercise for at least 150 minutes each week. The exercise should increase your heart rate and make you sweat (moderate-intensity exercise). ? Most adults should also do strengthening exercises at least twice a week. This is in addition to the moderate-intensity exercise.  Maintain a healthy weight  Body mass index (BMI) is a measurement that can be used to identify possible weight problems. It estimates body fat based on height and weight. Your health care provider can help determine your BMI and help you achieve or maintain a healthy weight.  For females 15 years of age and older: ? A BMI below 18.5 is considered underweight. ? A BMI of 18.5 to 24.9 is normal. ? A BMI of 25 to 29.9 is considered overweight. ? A BMI of 30 and above is considered obese.  Watch levels of cholesterol and blood lipids  You should start having your blood tested for lipids and cholesterol at 76 years of age, then have this test every 5 years.  You may need to have  your cholesterol levels checked more often if: ? Your lipid or cholesterol levels are high. ? You are older than 76 years of age. ? You are at high risk for heart disease.  Cancer screening Lung Cancer  Lung cancer screening is recommended for adults 20-45 years old who are at high risk for lung cancer because of a history of smoking.  A yearly low-dose CT scan of the lungs is recommended for people who: ? Currently smoke. ? Have quit within the past 15 years. ? Have at least a 30-pack-year history of smoking. A pack year is smoking an average of  one pack of cigarettes a day for 1 year.  Yearly screening should continue until it has been 15 years since you quit.  Yearly screening should stop if you develop a health problem that would prevent you from having lung cancer treatment.  Breast Cancer  Practice breast self-awareness. This means understanding how your breasts normally appear and feel.  It also means doing regular breast self-exams. Let your health care provider know about any changes, no matter how small.  If you are in your 20s or 30s, you should have a clinical breast exam (CBE) by a health care provider every 1-3 years as part of a regular health exam.  If you are 43 or older, have a CBE every year. Also consider having a breast X-ray (mammogram) every year.  If you have a family history of breast cancer, talk to your health care provider about genetic screening.  If you are at high risk for breast cancer, talk to your health care provider about having an MRI and a mammogram every year.  Breast cancer gene (BRCA) assessment is recommended for women who have family members with BRCA-related cancers. BRCA-related cancers include: ? Breast. ? Ovarian. ? Tubal. ? Peritoneal cancers.  Results of the assessment will determine the need for genetic counseling and BRCA1 and BRCA2 testing.  Cervical Cancer Your health care provider may recommend that you be screened regularly for cancer of the pelvic organs (ovaries, uterus, and vagina). This screening involves a pelvic examination, including checking for microscopic changes to the surface of your cervix (Pap test). You may be encouraged to have this screening done every 3 years, beginning at age 38.  For women ages 61-65, health care providers may recommend pelvic exams and Pap testing every 3 years, or they may recommend the Pap and pelvic exam, combined with testing for human papilloma virus (HPV), every 5 years. Some types of HPV increase your risk of cervical cancer.  Testing for HPV may also be done on women of any age with unclear Pap test results.  Other health care providers may not recommend any screening for nonpregnant women who are considered low risk for pelvic cancer and who do not have symptoms. Ask your health care provider if a screening pelvic exam is right for you.  If you have had past treatment for cervical cancer or a condition that could lead to cancer, you need Pap tests and screening for cancer for at least 20 years after your treatment. If Pap tests have been discontinued, your risk factors (such as having a new sexual partner) need to be reassessed to determine if screening should resume. Some women have medical problems that increase the chance of getting cervical cancer. In these cases, your health care provider may recommend more frequent screening and Pap tests.  Colorectal Cancer  This type of cancer can be detected and often prevented.  Routine  colorectal cancer screening usually begins at 76 years of age and continues through 76 years of age.  Your health care provider may recommend screening at an earlier age if you have risk factors for colon cancer.  Your health care provider may also recommend using home test kits to check for hidden blood in the stool.  A small camera at the end of a tube can be used to examine your colon directly (sigmoidoscopy or colonoscopy). This is done to check for the earliest forms of colorectal cancer.  Routine screening usually begins at age 15.  Direct examination of the colon should be repeated every 5-10 years through 76 years of age. However, you may need to be screened more often if early forms of precancerous polyps or small growths are found.  Skin Cancer  Check your skin from head to toe regularly.  Tell your health care provider about any new moles or changes in moles, especially if there is a change in a mole's shape or color.  Also tell your health care provider if you have a mole  that is larger than the size of a pencil eraser.  Always use sunscreen. Apply sunscreen liberally and repeatedly throughout the day.  Protect yourself by wearing long sleeves, pants, a wide-brimmed hat, and sunglasses whenever you are outside.  Heart disease, diabetes, and high blood pressure  High blood pressure causes heart disease and increases the risk of stroke. High blood pressure is more likely to develop in: ? People who have blood pressure in the high end of the normal range (130-139/85-89 mm Hg). ? People who are overweight or obese. ? People who are African American.  If you are 38-11 years of age, have your blood pressure checked every 3-5 years. If you are 53 years of age or older, have your blood pressure checked every year. You should have your blood pressure measured twice-once when you are at a hospital or clinic, and once when you are not at a hospital or clinic. Record the average of the two measurements. To check your blood pressure when you are not at a hospital or clinic, you can use: ? An automated blood pressure machine at a pharmacy. ? A home blood pressure monitor.  If you are between 39 years and 35 years old, ask your health care provider if you should take aspirin to prevent strokes.  Have regular diabetes screenings. This involves taking a blood sample to check your fasting blood sugar level. ? If you are at a normal weight and have a low risk for diabetes, have this test once every three years after 76 years of age. ? If you are overweight and have a high risk for diabetes, consider being tested at a younger age or more often. Preventing infection Hepatitis B  If you have a higher risk for hepatitis B, you should be screened for this virus. You are considered at high risk for hepatitis B if: ? You were born in a country where hepatitis B is common. Ask your health care provider which countries are considered high risk. ? Your parents were born in a high-risk  country, and you have not been immunized against hepatitis B (hepatitis B vaccine). ? You have HIV or AIDS. ? You use needles to inject street drugs. ? You live with someone who has hepatitis B. ? You have had sex with someone who has hepatitis B. ? You get hemodialysis treatment. ? You take certain medicines for conditions, including cancer, organ transplantation,  and autoimmune conditions.  Hepatitis C  Blood testing is recommended for: ? Everyone born from [birth year] through 1964. ? Anyone with known risk factors for hepatitis C.  Sexually transmitted infections (STIs)  You should be screened for sexually transmitted infections (STIs) including gonorrhea and chlamydia if: ? You are sexually active and are younger than 76 years of age. ? You are older than 76 years of age and your health care provider tells you that you are at risk for this type of infection. ? Your sexual activity has changed since you were last screened and you are at an increased risk for chlamydia or gonorrhea. Ask your health care provider if you are at risk.  If you do not have HIV, but are at risk, it may be recommended that you take a prescription medicine daily to prevent HIV infection. This is called pre-exposure prophylaxis (PrEP). You are considered at risk if: ? You are sexually active and do not regularly use condoms or know the HIV status of your partner(s). ? You take drugs by injection. ? You are sexually active with a partner who has HIV.  Talk with your health care provider about whether you are at high risk of being infected with HIV. If you choose to begin PrEP, you should first be tested for HIV. You should then be tested every 3 months for as long as you are taking PrEP. Pregnancy  If you are premenopausal and you may become pregnant, ask your health care provider about preconception counseling.  If you may become pregnant, take 400 to 800 micrograms (mcg) of folic acid every day.  If you want to  prevent pregnancy, talk to your health care provider about birth control (contraception). Osteoporosis and menopause  Osteoporosis is a disease in which the bones lose minerals and strength with aging. This can result in serious bone fractures. Your risk for osteoporosis can be identified using a bone density scan.  If you are 53 years of age or older, or if you are at risk for osteoporosis and fractures, ask your health care provider if you should be screened.  Ask your health care provider whether you should take a calcium or vitamin D supplement to lower your risk for osteoporosis.  Menopause may have certain physical symptoms and risks.  Hormone replacement therapy may reduce some of these symptoms and risks. Talk to your health care provider about whether hormone replacement therapy is right for you. Follow these instructions at home:  Schedule regular health, dental, and eye exams.  Stay current with your immunizations.  Do not use any tobacco products including cigarettes, chewing tobacco, or electronic cigarettes.  If you are pregnant, do not drink alcohol.  If you are breastfeeding, limit how much and how often you drink alcohol.  Limit alcohol intake to no more than 1 drink per day for nonpregnant women. One drink equals 12 ounces of beer, 5 ounces of wine, or 1 ounces of hard liquor.  Do not use street drugs.  Do not share needles.  Ask your health care provider for help if you need support or information about quitting drugs.  Tell your health care provider if you often feel depressed.  Tell your health care provider if you have ever been abused or do not feel safe at home. This information is not intended to replace advice given to you by your health care provider. Make sure you discuss any questions you have with your health care provider. Document Released: 10/02/2010 Document Revised:  08/25/2015 Document Reviewed: 12/21/2014 Elsevier Interactive Patient  Education  2018 [employer]. Insomnia Insomnia is a sleep disorder that makes it difficult to fall asleep or to stay asleep. Insomnia can cause tiredness (fatigue), low energy, difficulty concentrating, mood swings, and poor performance at work or school. There are three different ways to classify insomnia:  Difficulty falling asleep.  Difficulty staying asleep.  Waking up too early in the morning.  Any type of insomnia can be long-term (chronic) or short-term (acute). Both are common. Short-term insomnia usually lasts for three months or less. Chronic insomnia occurs at least three times a week for longer than three months. What are the causes? Insomnia may be caused by another condition, situation, or substance, such as:  Anxiety.  Certain medicines.  Gastroesophageal reflux disease (GERD) or other gastrointestinal conditions.  Asthma or other breathing conditions.  Restless legs syndrome, sleep apnea, or other sleep disorders.  Chronic pain.  Menopause. This may include hot flashes.  Stroke.  Abuse of alcohol, tobacco, or illegal drugs.  Depression.  Caffeine.  Neurological disorders, such as Alzheimer disease.  An overactive thyroid (hyperthyroidism).  The cause of insomnia may not be known. What increases the risk? Risk factors for insomnia include:  Gender. Women are more commonly affected than men.  Age. Insomnia is more common as you get older.  Stress. This may involve your professional or personal life.  Income. Insomnia is more common in people with lower income.  Lack of exercise.  Irregular work schedule or night shifts.  Traveling between different time zones.  What are the signs or symptoms? If you have insomnia, trouble falling asleep or trouble staying asleep is the main symptom. This may lead to other symptoms, such as:  Feeling fatigued.  Feeling nervous about going to sleep.  Not feeling rested in the morning.  Having trouble  concentrating.  Feeling irritable, anxious, or depressed.  How is this treated? Treatment for insomnia depends on the cause. If your insomnia is caused by an underlying condition, treatment will focus on addressing the condition. Treatment may also include:  Medicines to help you sleep.  Counseling or therapy.  Lifestyle adjustments.  Follow these instructions at home:  Take medicines only as directed by your health care provider.  Keep regular sleeping and waking hours. Avoid naps.  Keep a sleep diary to help you and your health care provider figure out what could be causing your insomnia. Include: ? When you sleep. ? When you wake up during the night. ? How well you sleep. ? How rested you feel the next day. ? Any side effects of medicines you are taking. ? What you eat and drink.  Make your bedroom a comfortable place where it is easy to fall asleep: ? Put up shades or special blackout curtains to block light from outside. ? Use a white noise machine to block noise. ? Keep the temperature cool.  Exercise regularly as directed by your health care provider. Avoid exercising right before bedtime.  Use relaxation techniques to manage stress. Ask your health care provider to suggest some techniques that may work well for you. These may include: ? Breathing exercises. ? Routines to release muscle tension. ? Visualizing peaceful scenes.  Cut back on alcohol, caffeinated beverages, and cigarettes, especially close to bedtime. These can disrupt your sleep.  Do not overeat or eat spicy foods right before bedtime. This can lead to digestive discomfort that can make it hard for you to sleep.  Limit screen use  before bedtime. This includes: ? Watching TV. ? Using your smartphone, tablet, and computer.  Stick to a routine. This can help you fall asleep faster. Try to do a quiet activity, brush your teeth, and go to bed at the same time each night.  Get out of bed if you are  still awake after 15 minutes of trying to sleep. Keep the lights down, but try reading or doing a quiet activity. When you feel sleepy, go back to bed.  Make sure that you drive carefully. Avoid driving if you feel very sleepy.  Keep all follow-up appointments as directed by your health care provider. This is important. Contact a health care provider if:  You are tired throughout the day or have trouble in your daily routine due to sleepiness.  You continue to have sleep problems or your sleep problems get worse. Get help right away if:  You have serious thoughts about hurting yourself or someone else. This information is not intended to replace advice given to you by your health care provider. Make sure you discuss any questions you have with your health care provider. Document Released: 03/16/2000 Document Revised: 08/19/2015 Document Reviewed: 12/18/2013 Elsevier Interactive Patient Education  2018 [place].  Influenza Virus Vaccine injection What is this medicine? INFLUENZA VIRUS VACCINE (in floo EN zuh VAHY ruhs vak SEEN) helps to reduce the risk of getting influenza also known as the flu. The vaccine only helps protect you against some strains of the flu. This medicine may be used for other purposes; ask your health care provider or pharmacist if you have questions. COMMON BRAND NAME(S): Afluria, Agriflu, Alfuria, FLUAD, Fluarix, Fluarix Quadrivalent, Flublok, Flublok Quadrivalent, FLUCELVAX, Flulaval, Fluvirin, Fluzone, Fluzone High-Dose, Fluzone Intradermal What should I tell my health care provider before I take this medicine? They need to know if you have any of these conditions: -bleeding disorder like hemophilia -fever or infection -Guillain-Barre syndrome or other neurological problems -immune system problems -infection with the human immunodeficiency virus (HIV) or AIDS -low blood platelet counts -multiple sclerosis -an unusual or allergic reaction to influenza virus  vaccine, latex, other medicines, foods, dyes, or preservatives. Different brands of vaccines contain different allergens. Some may contain latex or eggs. Talk to your doctor about your allergies to make sure that you get the right vaccine. -pregnant or trying to get pregnant -breast-feeding How should I use this medicine? This vaccine is for injection into a muscle or under the skin. It is given by a health care professional. A copy of Vaccine Information Statements will be given before each vaccination. Read this sheet carefully each time. The sheet may change frequently. Talk to your healthcare provider to see which vaccines are right for you. Some vaccines should not be used in all age groups. Overdosage: If you think you have taken too much of this medicine contact a poison control center or emergency room at once. NOTE: This medicine is only for you. Do not share this medicine with others. What if I miss a dose? This does not apply. What may interact with this medicine? -chemotherapy or radiation therapy -medicines that lower your immune system like etanercept, anakinra, infliximab, and adalimumab -medicines that treat or prevent blood clots like warfarin -phenytoin -steroid medicines like prednisone or cortisone -theophylline -vaccines This list may not describe all possible interactions. Give your health care provider a list of all the medicines, herbs, non-prescription drugs, or dietary supplements you use. Also tell them if you smoke, drink alcohol, or use illegal drugs.  Some items may interact with your medicine. What should I watch for while using this medicine? Report any side effects that do not go away within 3 days to your doctor or health care professional. Call your health care provider if any unusual symptoms occur within 6 weeks of receiving this vaccine. You may still catch the flu, but the illness is not usually as bad. You cannot get the flu from the vaccine. The vaccine  will not protect against colds or other illnesses that may cause fever. The vaccine is needed every year. What side effects may I notice from receiving this medicine? Side effects that you should report to your doctor or health care professional as soon as possible: -allergic reactions like skin rash, itching or hives, swelling of the face, lips, or tongue Side effects that usually do not require medical attention (report to your doctor or health care professional if they continue or are bothersome): -fever -headache -muscle aches and pains -pain, tenderness, redness, or swelling at the injection site -tiredness This list may not describe all possible side effects. Call your doctor for medical advice about side effects. You may report side effects to FDA at 1-800-FDA-1088. Where should I keep my medicine? The vaccine will be given by a health care professional in a clinic, pharmacy, doctor's office, or other health care setting. You will not be given vaccine doses to store at home. NOTE: This sheet is a summary. It may not cover all possible information. If you have questions about this medicine, talk to your doctor, pharmacist, or health care provider.  2018 Elsevier/Gold Standard (2014-10-08 10:07:28)

## 2017-12-26 ENCOUNTER — Other Ambulatory Visit: Payer: Self-pay | Admitting: Internal Medicine

## 2017-12-26 IMAGING — DX DG CHEST 2V
2 series · 2 of 2 positions shown · non-contrast
Comparison: Chest x-ray dated 08/20/2014.

CLINICAL DATA: Cough for 2 months, wheezing intermittent,
hypertension. History of pneumonia.

EXAM:
CHEST  2 VIEW

[chest pa]
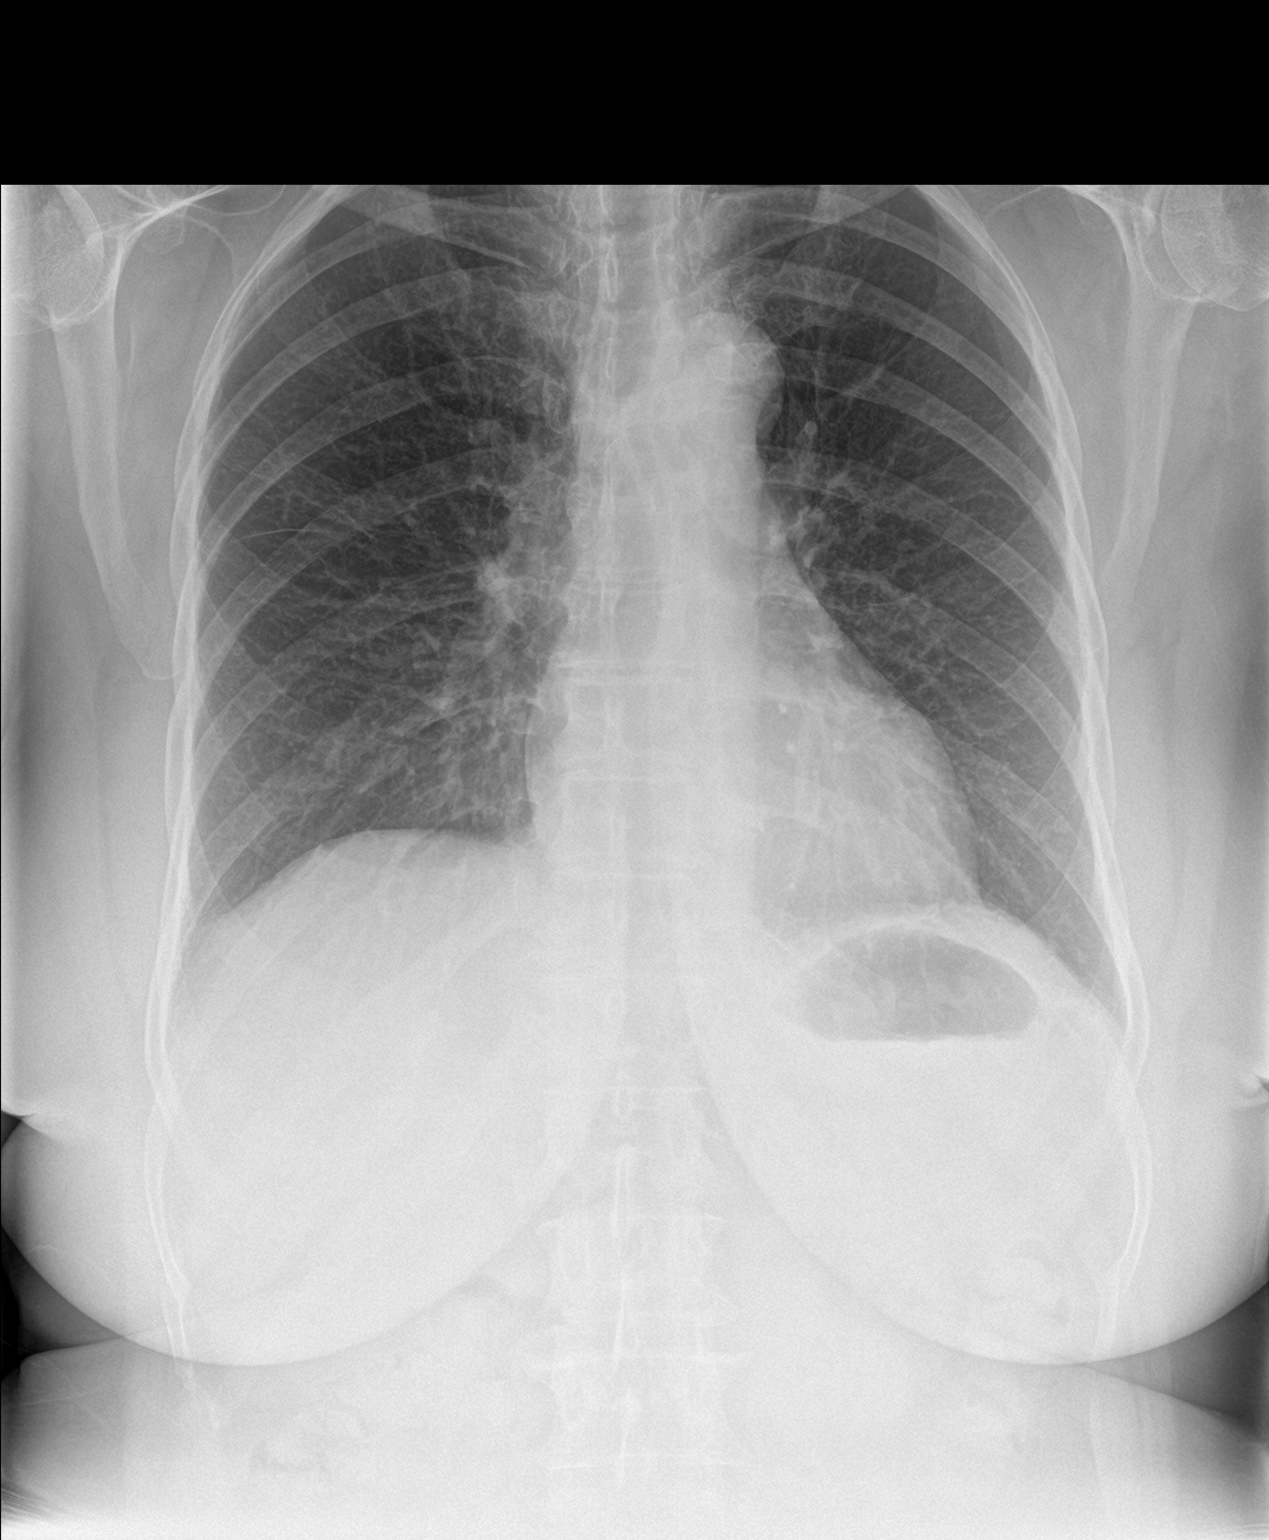

[chest lat]
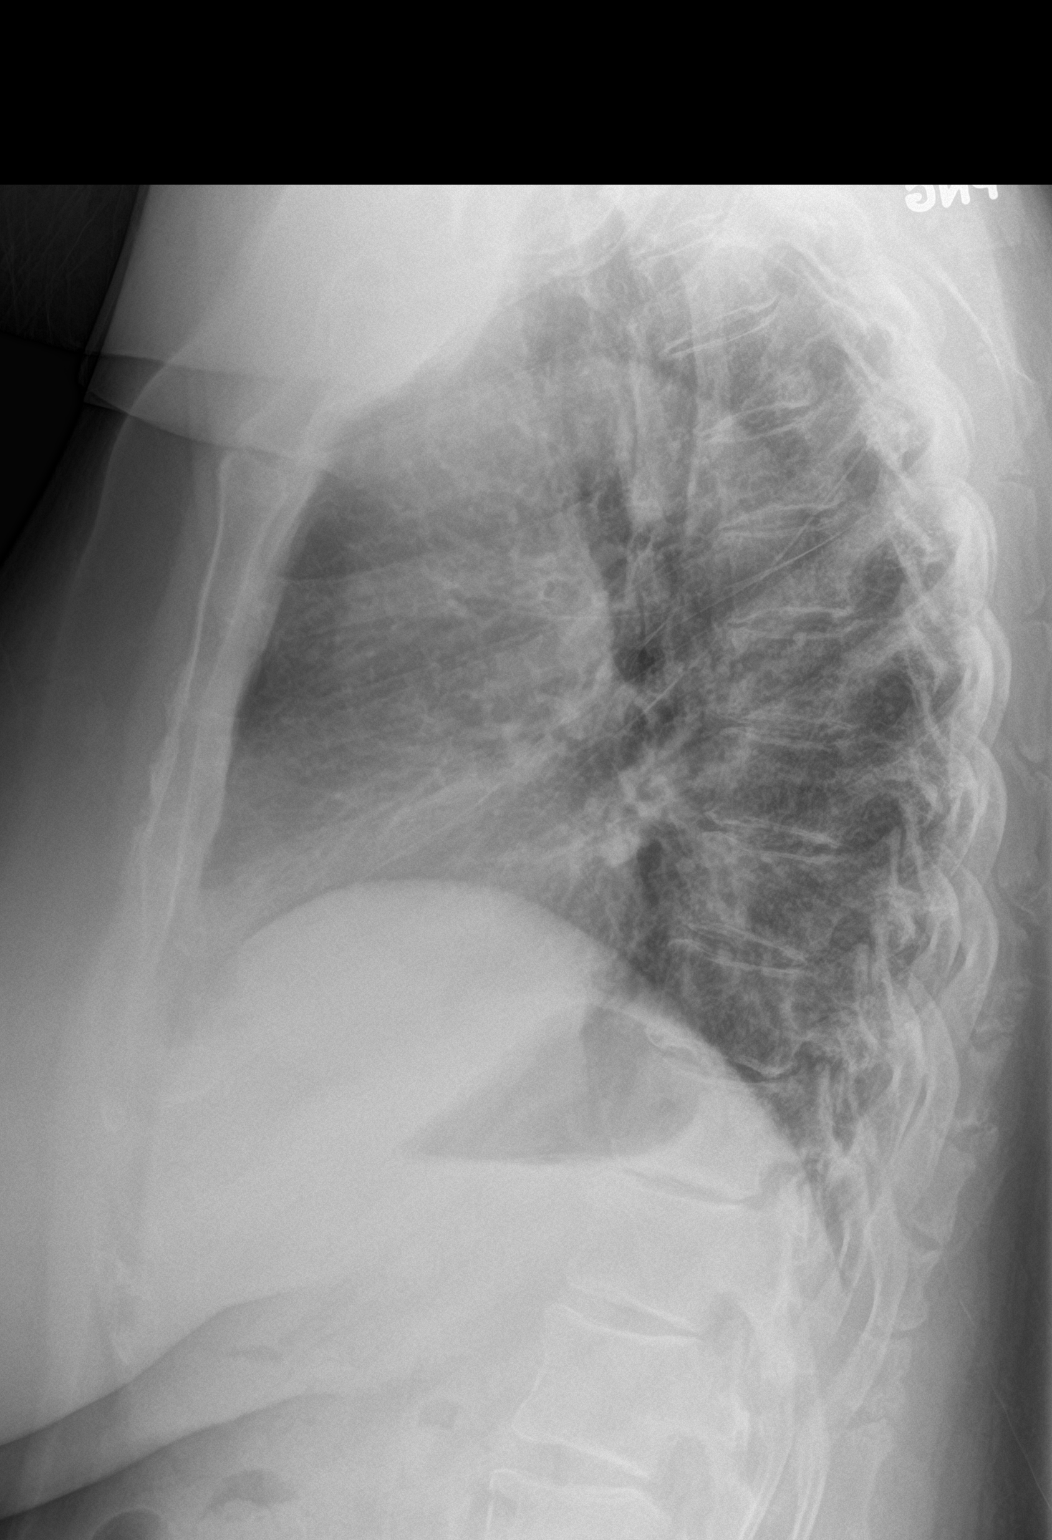

[2 of 2 positions shown; findings below may reference images not displayed]

FINDINGS: Heart size is normal. Lungs are now clear. Lung volumes are normal.
No evidence of pneumonia. No pleural effusions seen. No pneumothorax
seen. Osseous and soft tissue structures about the chest are
unremarkable.
IMPRESSION: Lungs are clear and there is no evidence of acute cardiopulmonary
abnormality.

## 2018-01-20 DIAGNOSIS — I10 Essential (primary) hypertension: Secondary | ICD-10-CM | POA: Diagnosis not present

## 2018-01-20 DIAGNOSIS — Z86711 Personal history of pulmonary embolism: Secondary | ICD-10-CM | POA: Diagnosis not present

## 2018-01-20 DIAGNOSIS — I25118 Atherosclerotic heart disease of native coronary artery with other forms of angina pectoris: Secondary | ICD-10-CM | POA: Diagnosis not present

## 2018-01-20 DIAGNOSIS — I252 Old myocardial infarction: Secondary | ICD-10-CM | POA: Diagnosis not present

## 2018-01-20 DIAGNOSIS — E785 Hyperlipidemia, unspecified: Secondary | ICD-10-CM | POA: Diagnosis not present

## 2018-02-26 ENCOUNTER — Ambulatory Visit: Payer: Self-pay | Admitting: *Deleted

## 2018-02-26 ENCOUNTER — Telehealth: Payer: Self-pay

## 2018-02-26 MED ORDER — PANTOPRAZOLE SODIUM 40 MG PO TBEC: 40.0000 mg | DELAYED_RELEASE_TABLET | Freq: Every day | ORAL | 0 refills | Status: DC

## 2018-02-26 NOTE — Telephone Encounter (Signed)
   Reason for Disposition . Age > 60 years    Patient states she has lower abdominal pain that comes and goes. She has had this pain for 1-2 months and states she has mentioned it at previous appointments. Patient triaged to make sure she does not have acute abdomin. She states she is not having severe pain and she relates her pain to possible arthritis. Patient has appointment for Friday and she is OK to wait- will send note for review.  Answer Assessment - Initial Assessment Questions 1. LOCATION: "Where does it hurt?"      Lower abdominal pain and hip pain 2. RADIATION: "Does the pain shoot anywhere else?" (e.g., chest, back)     Radiates to buttock area 3. ONSET: "When did the pain begin?" (e.g., minutes, hours or days ago)      1-2 months 4. SUDDEN: "Gradual or sudden onset?"     Gradual onset 5. PATTERN "Does the pain come and go, or is it constant?"    - If constant: "Is it getting better, staying the same, or worsening?"      (Note: Constant means the pain never goes away completely; most serious pain is constant and it progresses)     - If intermittent: "How long does it last?" "Do you have pain now?"     (Note: Intermittent means the pain goes away completely between bouts)     Comes and goes 6. SEVERITY: "How bad is the pain?"  (e.g., Scale 1-10; mild, moderate, or severe)   - MILD (1-3): doesn't interfere with normal activities, abdomen soft and not tender to touch    - MODERATE (4-7): interferes with normal activities or awakens from sleep, tender to touch    - SEVERE (8-10): excruciating pain, doubled over, unable to do any normal activities      moderate 7. RECURRENT SYMPTOM: "Have you ever had this type of abdominal pain before?" If so, ask: "When was the last time?" and "What happened that time?"      Not sure the cause 8. CAUSE: "What do you think is causing the abdominal pain?"     Not sure- if muscular or bone related 9. RELIEVING/AGGRAVATING FACTORS: "What makes it  better or worse?" (e.g., movement, antacids, bowel movement)     Nothing makes it better 10. OTHER SYMPTOMS: "Has there been any vomiting, diarrhea, constipation, or urine problems?"       no 11. PREGNANCY: "Is there any chance you are pregnant?" "When was your last menstrual period?"       n/a  Protocols used: ABDOMINAL PAIN - [facility]

## 2018-02-26 NOTE — Telephone Encounter (Signed)
Rx changed. Pt is aware.

## 2018-02-26 NOTE — Telephone Encounter (Signed)
Copied from [place] [id number]. Topic: General - Other >> Feb 26, 2018  1:55 PM Carolyn Stare wrote:  Pt call to say the 20mg  was not working for her and that she use to be on 40mg  so she was taking 2 20mg  daily to equal 40 mg and now she is out and is asking for a RX OF 40 MG to be called in to her pharmacy  she said she is out the medicine   pantoprazole (PROTONIX) 20 MG tablet  [facility] [employer]

## 2018-02-28 ENCOUNTER — Encounter: Payer: Self-pay | Admitting: Internal Medicine

## 2018-02-28 ENCOUNTER — Ambulatory Visit (INDEPENDENT_AMBULATORY_CARE_PROVIDER_SITE_OTHER): Payer: Medicare Other | Admitting: Internal Medicine

## 2018-02-28 VITALS — BP 114/70 | HR 81 | Temp 97.8°F | Resp 16 | Wt 195.0 lb

## 2018-02-28 DIAGNOSIS — M79641 Pain in right hand: Secondary | ICD-10-CM | POA: Insufficient documentation

## 2018-02-28 DIAGNOSIS — M79642 Pain in left hand: Secondary | ICD-10-CM

## 2018-02-28 DIAGNOSIS — M48062 Spinal stenosis, lumbar region with neurogenic claudication: Secondary | ICD-10-CM | POA: Diagnosis not present

## 2018-02-28 NOTE — Patient Instructions (Addendum)
  For your hands you can take 1000 mg of tylenol three times a day if needed.  Try Aspercreme for your hands.  Try heat.      A referral was ordered for orthopedics.

## 2018-02-28 NOTE — Assessment & Plan Note (Signed)
Has chronic lower back pain s/p surgery 2016 for stenosis Intermittent groin/lower abdominal pain likely musculoskeletal in nature - intermittent only with activity, resolves with rest No GI/GU symptoms and CT of Abd / pelvis unremarkable in 08/2017 Refer to ortho for further evaluation/ treatment
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
## 2018-02-28 NOTE — Assessment & Plan Note (Signed)
?   Arthritis - some of her pain is arthritic in nature, but there may be a component of neuropathy as well Deferred referral to ortho Start tylenol 1000 mg three times a day Topical arthritis creams Heat/ice

## 2018-03-19 ENCOUNTER — Ambulatory Visit: Payer: Medicare Other | Admitting: Nurse Practitioner

## 2018-03-20 ENCOUNTER — Encounter: Payer: Self-pay | Admitting: Family Medicine

## 2018-03-20 ENCOUNTER — Ambulatory Visit (INDEPENDENT_AMBULATORY_CARE_PROVIDER_SITE_OTHER): Payer: Medicare Other | Admitting: Family Medicine

## 2018-03-20 VITALS — BP 120/72 | HR 83 | Temp 98.4°F | Ht 63.0 in

## 2018-03-20 DIAGNOSIS — J209 Acute bronchitis, unspecified: Secondary | ICD-10-CM

## 2018-03-20 MED ORDER — BENZONATATE 100 MG PO CAPS: 100.0000 mg | ORAL_CAPSULE | Freq: Three times a day (TID) | ORAL | 0 refills | Status: DC

## 2018-03-20 MED ORDER — DOXYCYCLINE HYCLATE 100 MG PO TABS: 100.0000 mg | ORAL_TABLET | Freq: Two times a day (BID) | ORAL | 0 refills | Status: DC

## 2018-03-20 NOTE — Progress Notes (Signed)
Patient ID: [name], female   DOB: [birth year], 77 y.o.   MRN: [id number]  PCP: Binnie Rail, MD  Subjective:  [name] is a 77 y.o. year old very pleasant female patient who presents with symptoms including nasal congestion, cough that is minimally productive with yellow sputum  -started: 10 days ago, symptoms are not improving -previous treatments: Hot tea has provided limited benefit -sick contacts/travel/risks: denies flu exposure. Recent sick contact exposure with 76 year old great nephew. No recent exposure to influenza -Hx of: Cough variant asthma Influenza vaccine is UTD this year She has not used her albuterol inhaler during this episode. She does take montelukast daily She is not a smoker No recent antibiotic use  ROS-denies fever, SOB, NVD, tooth pain  Pertinent Past Medical History- HTN, cardiomyopathy  Medications- reviewed  Current Outpatient Medications  Medication Sig Dispense Refill  . albuterol (PROAIR HFA) 108 (90 Base) MCG/ACT inhaler INHALE 2 PUFFS BY MOUTH EVERY 6 HOURS AS NEEDED FOR WHEEZING 18 g 8  . aspirin 81 MG EC tablet Take 1 tablet (81 mg total) by mouth daily. 30 tablet 0  . atorvastatin (LIPITOR) 40 MG tablet Take 1 tablet (40 mg total) by mouth daily at 6 PM. 90 tablet 1  . butalbital-acetaminophen-caffeine (FIORICET, ESGIC) 50-325-40 MG tablet TAKE 1 TABLET BY MOUTH EVERY 6 HOURS AS NEEDED FOR HEADACHE 12 tablet 0  . carvedilol (COREG) 25 MG tablet Take 25 mg by mouth 2 (two) times daily.  0  . ELIQUIS 5 MG TABS tablet TAKE 1 TABLET BY MOUTH 2 TIMES A DAY 180 tablet 1  . FLUAD 0.5 ML SUSY ADM 0.5ML IM UTD  0  . fluticasone (FLONASE) 50 MCG/ACT nasal spray INHALE 1 SPRAYS IN EACH NOSTRIL TWICE DAILY 16 g 1  . furosemide (LASIX) 40 MG tablet take 1 tablet by mouth once daily 90 tablet 1  . irbesartan (AVAPRO) 300 MG tablet Take 300 mg by mouth daily.  0  . LamoTRIgine 100 MG TB24 24 hour tablet Take 2 tablets (200 mg total) by mouth  at bedtime. 180 tablet 3  . montelukast (SINGULAIR) 10 MG tablet TAKE 1 TABLET(10 MG) BY MOUTH AT BEDTIME 90 tablet 1  . Multiple Vitamin (MULTIVITAMIN) tablet Take by mouth.    . nitroGLYCERIN (NITROSTAT) 0.4 MG SL tablet Place 0.4 mg under the tongue every 5 (five) minutes as needed for chest pain.     . nortriptyline (PAMELOR) 25 MG capsule TAKE 1 CAPSULE BY MOUTH AT BEDTIME 90 capsule 1  . pantoprazole (PROTONIX) 40 MG tablet Take 1 tablet (40 mg total) by mouth daily. 90 tablet 0  . spironolactone (ALDACTONE) 25 MG tablet Take 25 mg by mouth daily.  3  . valACYclovir (VALTREX) 1000 MG tablet TAKE 1 TABLET BY MOUTH ONCE DAILY AS NEEDED FOR OUTBREAKS 90 tablet 0  . zolpidem (AMBIEN) 5 MG tablet TAKE 1 TABLET BY MOUTH EVERY NIGHT AT BEDTIME AS NEEDED FOR SLEEP **NOT BE TAKEN CHRONICALLY** 30 tablet 3   No current facility-administered medications for this visit.     Objective: BP 120/72 (BP Location: Left Arm, Patient Position: Sitting, Cuff Size: Normal)   Pulse 83   Temp 98.4 F (36.9 C) (Oral)   Ht 5\' 3"  (1.6 m)   SpO2 97%   BMI 34.54 kg/m  Gen: NAD, resting comfortably HEENT: Turbinates are erythematous, TMs dull bilaterally, TMs do not exhibit erythema or bulging, oropharynx is clear and moist without exudate or edema, no sinus tenderness  CV: RRR no murmurs rubs or gallops Lungs: CTAB no crackles, wheeze, rhonchi Ext: no edema Skin: warm, dry, no rash   Assessment/Plan: 1. Acute bronchitis, unspecified organism Patient's symptoms most consistent with bronchitis. Discussed likely viral nature and less likely bacterial cause. Benign lung exam and stable vital signs make pneumonia unlikely. Discussed possible treatment options and will provide benzonatate for cough and she will use her albuterol inhaler if needed.  She prefers benzonatate and stated that she will not use OTC medications due to expense. Also provided with a prescription for doxycycline to take if not improved in  the next 2-3 days. Advised that if her symptoms were to worsen she should let us know. Return precautions provided.   - doxycycline (VIBRA-TABS) 100 MG tablet; Take 1 tablet (100 mg total) by mouth 2 (two) times daily.  Dispense: 20 tablet; Refill: 0 - benzonatate (TESSALON) 100 MG capsule; Take 1 capsule (100 mg total) by mouth 3 (three) times daily.  Dispense: 20 capsule; Refill: 0   Finally, we reviewed reasons to return to care including if symptoms worsen or persist or new concerns arise- once again particularly shortness of breath or fever. If no improvement, chest X-ray can be considered.    Laurita Quint, FNP

## 2018-03-20 NOTE — Patient Instructions (Signed)
It was great seeing you today.  I have sent in a prescription for benzonatate for cough.   Inhaler can be used as directed for symptoms.  An antibiotic has been provided if your symptoms are not improving.   Follow up if no improvement or worsening symptoms.  Feel better soon!      Acute Bronchitis Bronchitis is inflammation of the airways that extend from the windpipe into the lungs (bronchi). The inflammation often causes mucus to develop. This leads to a cough, which is the most common symptom of bronchitis.  In acute bronchitis, the condition usually develops suddenly and goes away over time, usually in a couple weeks. Smoking, allergies, and asthma can make bronchitis worse. Repeated episodes of bronchitis may cause further lung problems.  CAUSES Acute bronchitis is most often caused by the same virus that causes a cold. The virus can spread from person to person (contagious) through coughing, sneezing, and touching contaminated objects. SIGNS AND SYMPTOMS   Cough.   Fever.   Coughing up mucus.   Body aches.   Chest congestion.   Chills.   Shortness of breath.   Sore throat.  DIAGNOSIS  Acute bronchitis is usually diagnosed through a physical exam. Your health care provider will also ask you questions about your medical history. Tests, such as chest X-rays, are sometimes done to rule out other conditions.  TREATMENT  Acute bronchitis usually goes away in a couple weeks. Oftentimes, no medical treatment is necessary. Medicines are sometimes given for relief of fever or cough. Antibiotic medicines are usually not needed but may be prescribed in certain situations. In some cases, an inhaler may be recommended to help reduce shortness of breath and control the cough. A cool mist vaporizer may also be used to help thin bronchial secretions and make it easier to clear the chest.  HOME CARE INSTRUCTIONS  Get plenty of rest.   Drink enough fluids to keep your urine  clear or pale yellow (unless you have a medical condition that requires fluid restriction). Increasing fluids may help thin your respiratory secretions (sputum) and reduce chest congestion, and it will prevent dehydration.   Take medicines only as directed by your health care provider.  If you were prescribed an antibiotic medicine, finish it all even if you start to feel better.  Avoid smoking and secondhand smoke. Exposure to cigarette smoke or irritating chemicals will make bronchitis worse. If you are a smoker, consider using nicotine gum or skin patches to help control withdrawal symptoms. Quitting smoking will help your lungs heal faster.   Reduce the chances of another bout of acute bronchitis by washing your hands frequently, avoiding people with cold symptoms, and trying not to touch your hands to your mouth, nose, or eyes.   Keep all follow-up visits as directed by your health care provider.  SEEK MEDICAL CARE IF: Your symptoms do not improve after 1 week of treatment.  SEEK IMMEDIATE MEDICAL CARE IF:  You develop an increased fever or chills.   You have chest pain.   You have severe shortness of breath.  You have bloody sputum.   You develop dehydration.  You faint or repeatedly feel like you are going to pass out.  You develop repeated vomiting.  You develop a severe headache. MAKE SURE YOU:   Understand these instructions.  Will watch your condition.  Will get help right away if you are not doing well or get worse.   This information is not intended to replace advice  given to you by your health care provider. Make sure you discuss any questions you have with your health care provider.   Document Released: 04/26/2004 Document Revised: 04/09/2014 Document Reviewed: 09/09/2012 Elsevier Interactive Patient Education [employer].

## 2018-04-01 ENCOUNTER — Ambulatory Visit (INDEPENDENT_AMBULATORY_CARE_PROVIDER_SITE_OTHER): Payer: Medicare Other | Admitting: Orthopaedic Surgery

## 2018-04-04 ENCOUNTER — Other Ambulatory Visit: Payer: Self-pay

## 2018-04-04 ENCOUNTER — Ambulatory Visit: Payer: Self-pay | Admitting: *Deleted

## 2018-04-04 ENCOUNTER — Encounter (HOSPITAL_COMMUNITY): Payer: Self-pay

## 2018-04-04 ENCOUNTER — Ambulatory Visit (HOSPITAL_COMMUNITY)
Admission: EM | Admit: 2018-04-04 | Discharge: 2018-04-04 | Disposition: A | Discharge status: Home / Self Care | Payer: Medicare Other | Attending: Family Medicine | Admitting: Family Medicine

## 2018-04-04 DIAGNOSIS — Z86711 Personal history of pulmonary embolism: Secondary | ICD-10-CM | POA: Diagnosis not present

## 2018-04-04 DIAGNOSIS — Z8249 Family history of ischemic heart disease and other diseases of the circulatory system: Secondary | ICD-10-CM | POA: Insufficient documentation

## 2018-04-04 DIAGNOSIS — R1031 Right lower quadrant pain: Secondary | ICD-10-CM | POA: Insufficient documentation

## 2018-04-04 DIAGNOSIS — E785 Hyperlipidemia, unspecified: Secondary | ICD-10-CM | POA: Diagnosis not present

## 2018-04-04 DIAGNOSIS — I10 Essential (primary) hypertension: Secondary | ICD-10-CM | POA: Diagnosis not present

## 2018-04-04 DIAGNOSIS — Z7722 Contact with and (suspected) exposure to environmental tobacco smoke (acute) (chronic): Secondary | ICD-10-CM | POA: Insufficient documentation

## 2018-04-04 DIAGNOSIS — E0500 Thyrotoxicosis with diffuse goiter without thyrotoxic crisis or storm: Secondary | ICD-10-CM | POA: Insufficient documentation

## 2018-04-04 DIAGNOSIS — K219 Gastro-esophageal reflux disease without esophagitis: Secondary | ICD-10-CM | POA: Diagnosis not present

## 2018-04-04 DIAGNOSIS — M797 Fibromyalgia: Secondary | ICD-10-CM | POA: Insufficient documentation

## 2018-04-04 DIAGNOSIS — Z8673 Personal history of transient ischemic attack (TIA), and cerebral infarction without residual deficits: Secondary | ICD-10-CM | POA: Diagnosis not present

## 2018-04-04 LAB — COMPREHENSIVE METABOLIC PANEL
ALT: 19 U/L (ref 0–44)
AST: 20 U/L (ref 15–41)
Albumin: 3.4 g/dL — ABNORMAL LOW (ref 3.5–5.0)
Alkaline Phosphatase: 85 U/L (ref 38–126)
Anion gap: 6 (ref 5–15)
BUN: 18 mg/dL (ref 8–23)
CO2: 27 mmol/L (ref 22–32)
Calcium: 9.3 mg/dL (ref 8.9–10.3)
Chloride: 104 mmol/L (ref 98–111)
Creatinine, Ser: 1.35 mg/dL — ABNORMAL HIGH (ref 0.44–1.00)
GFR calc Af Amer: 44 mL/min — ABNORMAL LOW (ref >60)
GFR calc non Af Amer: 38 mL/min — ABNORMAL LOW (ref >60)
Glucose, Bld: 102 mg/dL — ABNORMAL HIGH (ref 70–99)
Potassium: 4.4 mmol/L (ref 3.5–5.1)
Sodium: 137 mmol/L (ref 135–145)
Total Bilirubin: 0.2 mg/dL — ABNORMAL LOW (ref 0.3–1.2)
Total Protein: 7.4 g/dL (ref 6.5–8.1)

## 2018-04-04 LAB — CBC WITH DIFFERENTIAL/PLATELET
Abs Immature Granulocytes: 0.01 10*3/uL (ref 0.00–0.07)
Basophils Absolute: 0 10*3/uL (ref 0.0–0.1)
Basophils Relative: 1 %
Eosinophils Absolute: 0.1 10*3/uL (ref 0.0–0.5)
Eosinophils Relative: 2 %
HCT: 44 % (ref 36.0–46.0)
Hemoglobin: 13.6 g/dL (ref 12.0–15.0)
Immature Granulocytes: 0 %
Lymphocytes Relative: 43 %
Lymphs Abs: 2.8 10*3/uL (ref 0.7–4.0)
MCH: 28.2 pg (ref 26.0–34.0)
MCHC: 30.9 g/dL (ref 30.0–36.0)
MCV: 91.1 fL (ref 80.0–100.0)
MONO ABS: 0.6 10*3/uL (ref 0.1–1.0)
Monocytes Relative: 9 %
Neutro Abs: 2.9 10*3/uL (ref 1.7–7.7)
Neutrophils Relative %: 45 %
Platelets: 231 10*3/uL (ref 150–400)
RBC: 4.83 MIL/uL (ref 3.87–5.11)
RDW: 13.2 % (ref 11.5–15.5)
WBC: 6.4 10*3/uL (ref 4.0–10.5)
nRBC: 0 % (ref 0.0–0.2)

## 2018-04-04 LAB — POCT URINALYSIS DIP (DEVICE)
Bilirubin Urine: NEGATIVE
Glucose, UA: NEGATIVE mg/dL
Ketones, ur: NEGATIVE mg/dL
Leukocytes, UA: NEGATIVE
Nitrite: NEGATIVE
Protein, ur: NEGATIVE mg/dL
Specific Gravity, Urine: 1.02 (ref 1.005–1.030)
Urobilinogen, UA: 0.2 mg/dL (ref 0.0–1.0)
pH: 5.5 (ref 5.0–8.0)

## 2018-04-04 LAB — AMYLASE: Amylase: 65 U/L (ref 28–100)

## 2018-04-04 LAB — LIPASE, BLOOD: Lipase: 28 U/L (ref 11–51)

## 2018-04-04 NOTE — ED Notes (Signed)
Patient able to ambulate independently  

## 2018-04-04 NOTE — Telephone Encounter (Signed)
Patient is calling stating she had exacerbation of her abdominal pain last night that about made her go to ED. She states she feels that she is going to have another episode and she does not want to have to go through that again. There is no availability at PCP to work patient in today and we do advise that she is seen within 24 hours. Patient doe not want to wait. She is going to go to ED/UC now.   Reason for Disposition . Age > 60 years  Answer Assessment - Initial Assessment Questions 1. LOCATION: "Where does it hurt?"      Right and lower part of stomach 2. RADIATION: "Does the pain shoot anywhere else?" (e.g., chest, back)     Radiates to hip and buttocks 3. ONSET: "When did the pain begin?" (e.g., minutes, hours or days ago)      Ongoing- got worse last night 4. SUDDEN: "Gradual or sudden onset?"     Gradual onset 5. PATTERN "Does the pain come and go, or is it constant?"    - If constant: "Is it getting better, staying the same, or worsening?"      (Note: Constant means the pain never goes away completely; most serious pain is constant and it progresses)     - If intermittent: "How long does it last?" "Do you have pain now?"     (Note: Intermittent means the pain goes away completely between bouts)     Comes and goes 6. SEVERITY: "How bad is the pain?"  (e.g., Scale 1-10; mild, moderate, or severe)   - MILD (1-3): doesn't interfere with normal activities, abdomen soft and not tender to touch    - MODERATE (4-7): interferes with normal activities or awakens from sleep, tender to touch    - SEVERE (8-10): excruciating pain, doubled over, unable to do any normal activities      Last night- severe- eased off today- feels that it is returning- it has never been this strong 7. RECURRENT SYMPTOM: "Have you ever had this type of abdominal pain before?" If so, ask: "When was the last time?" and "What happened that time?"      Yes- same area of stomach/abdomen- but never been this severe 8.  CAUSE: "What do you think is causing the abdominal pain?"     Patient thinks she has something growing 9. RELIEVING/AGGRAVATING FACTORS: "What makes it better or worse?" (e.g., movement, antacids, bowel movement)     no 10. OTHER SYMPTOMS: "Has there been any vomiting, diarrhea, constipation, or urine problems?"       None- sometimes she feels like she needs to move her bowels- but that does not change anything 11. PREGNANCY: "Is there any chance you are pregnant?" "When was your last menstrual period?"       n/a  Protocols used: ABDOMINAL PAIN - [facility]
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
## 2018-04-04 NOTE — Telephone Encounter (Signed)
FYI

## 2018-04-04 NOTE — Discharge Instructions (Signed)
You have been seen today for abdominal pain. Your evaluation was not suggestive of any emergent condition requiring medical intervention at this time. However, some abdominal problems make take more time to appear. Therefore, it's very important for you to pay attention to any new symptoms or worsening of your current condition.  Please return here or to the Emergency Department immediately should you feel worse in any way or have any of the following symptoms: increasing or different abdominal pain, persistent vomiting, fevers, or shaking chills.   We have sent labs and will call you should we find any significant abnormalities.

## 2018-04-04 NOTE — ED Triage Notes (Signed)
Pt cc she has abdominal pain  on the right side. This has been going on for a while.

## 2018-04-09 ENCOUNTER — Telehealth (HOSPITAL_COMMUNITY): Payer: Self-pay | Admitting: Emergency Medicine

## 2018-04-09 NOTE — Telephone Encounter (Signed)
Attempted to reach patient. No answer. Call cannot be completed.
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
## 2018-05-15 ENCOUNTER — Encounter: Payer: Medicare Other | Admitting: Internal Medicine

## 2018-05-28 ENCOUNTER — Emergency Department (HOSPITAL_COMMUNITY)
Admission: EM | Admit: 2018-05-28 | Discharge: 2018-05-28 | Disposition: A | Discharge status: Home / Self Care | Payer: Medicare Other | Attending: Emergency Medicine | Admitting: Emergency Medicine

## 2018-05-28 ENCOUNTER — Encounter (HOSPITAL_COMMUNITY): Payer: Self-pay | Admitting: *Deleted

## 2018-05-28 DIAGNOSIS — R109 Unspecified abdominal pain: Secondary | ICD-10-CM | POA: Diagnosis not present

## 2018-05-28 DIAGNOSIS — Z5321 Procedure and treatment not carried out due to patient leaving prior to being seen by health care provider: Secondary | ICD-10-CM | POA: Insufficient documentation

## 2018-05-28 NOTE — ED Notes (Signed)
Pt called no answer 

## 2018-05-28 NOTE — ED Triage Notes (Signed)
Pt in c/o left flank pain for the last two days, denies n/v, denies dysuria, no distress noted

## 2018-05-28 NOTE — ED Notes (Signed)
Pt called for room multiple times, no answer

## 2018-05-28 NOTE — ED Notes (Signed)
Called in lobby x1 no answer

## 2018-06-03 ENCOUNTER — Other Ambulatory Visit: Payer: Self-pay | Admitting: Internal Medicine
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
## 2018-06-04 ENCOUNTER — Ambulatory Visit: Payer: Medicare Other | Admitting: Nurse Practitioner

## 2018-06-05 ENCOUNTER — Encounter: Payer: Self-pay | Admitting: Nurse Practitioner
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
## 2018-06-19 ENCOUNTER — Encounter: Payer: Self-pay | Admitting: Nurse Practitioner

## 2018-06-19 ENCOUNTER — Ambulatory Visit (INDEPENDENT_AMBULATORY_CARE_PROVIDER_SITE_OTHER): Payer: Medicare Other | Admitting: Nurse Practitioner

## 2018-06-19 ENCOUNTER — Other Ambulatory Visit: Payer: Self-pay

## 2018-06-19 VITALS — BP 99/61 | HR 74 | Ht 63.0 in | Wt 197.0 lb

## 2018-06-19 DIAGNOSIS — G3184 Mild cognitive impairment, so stated: Secondary | ICD-10-CM

## 2018-06-19 DIAGNOSIS — R569 Unspecified convulsions: Secondary | ICD-10-CM

## 2018-06-19 MED ORDER — LAMOTRIGINE ER 100 MG PO TB24: 200.0000 mg | ORAL_TABLET | Freq: Every day | ORAL | 1 refills | Status: DC

## 2018-06-19 NOTE — Progress Notes (Signed)
I have reviewed and agreed above plan. 

## 2018-06-19 NOTE — Patient Instructions (Signed)
Continue Lamictal at current dose  Call for any seizure activity Memory score is stable 29/30  Continue moderate exercise F/U in 6 months

## 2018-06-23 ENCOUNTER — Other Ambulatory Visit: Payer: Self-pay | Admitting: Internal Medicine

## 2018-06-30 ENCOUNTER — Telehealth: Payer: Self-pay | Admitting: Internal Medicine

## 2018-06-30 NOTE — Telephone Encounter (Signed)
Copied from [place]. Topic: Appointment Scheduling - Scheduling Inquiry for Clinic >> Jun 30, 2018  2:31 PM [name] E wrote: Reason for CRM: [name] would like to have Dr. Quay Burow back as her PCP . [name] went to another provider due to them being closer to her home but [name] wants to come back to Dr. Quay Burow because the [name] doesn't like the new office and how messy they are. Derrek Monaco advise

## 2018-07-01 NOTE — Telephone Encounter (Signed)
Remove my name from banner.   I think if she went to a different doctor she did this for a reason, either she was not happy with me or our office, for that reason I think she needs to find a new physician.

## 2018-07-02 NOTE — Telephone Encounter (Signed)
Tired to call patient to inform that Dr Quay Burow is not able to take her back as a patient due to high patient volume. She will also need to cancel her Medicare Wellness Visit that is scheduled in September being that she is no longer a patient in our office.  No answer and unable to leave a voicemail.

## 2018-10-20 ENCOUNTER — Other Ambulatory Visit: Payer: Self-pay | Admitting: Cardiology

## 2018-10-20 DIAGNOSIS — R079 Chest pain, unspecified: Secondary | ICD-10-CM

## 2018-10-22 ENCOUNTER — Other Ambulatory Visit: Payer: Self-pay | Admitting: Family

## 2018-10-22 DIAGNOSIS — Z1231 Encounter for screening mammogram for malignant neoplasm of breast: Secondary | ICD-10-CM

## 2018-10-31 ENCOUNTER — Encounter (HOSPITAL_COMMUNITY)
Admission: RE | Admit: 2018-10-31 | Discharge: 2018-10-31 | Disposition: A | Discharge status: Home / Self Care | Payer: Medicare Other | Source: Ambulatory Visit | Attending: Cardiology | Admitting: Cardiology

## 2018-10-31 ENCOUNTER — Other Ambulatory Visit: Payer: Self-pay

## 2018-10-31 DIAGNOSIS — R079 Chest pain, unspecified: Secondary | ICD-10-CM | POA: Diagnosis present

## 2018-10-31 MED ORDER — REGADENOSON 0.4 MG/5ML IV SOLN
0.4000 mg | Freq: Once | INTRAVENOUS | Status: AC
  Administered 2018-10-31: 11:00:00 0.4 mg via INTRAVENOUS

## 2018-10-31 MED ORDER — REGADENOSON 0.4 MG/5ML IV SOLN
INTRAVENOUS | Status: AC
  Filled 2018-10-31: qty 5

## 2018-10-31 MED ORDER — TECHNETIUM TC 99M TETROFOSMIN IV KIT
30.0000 | PACK | Freq: Once | INTRAVENOUS | Status: AC | PRN
  Administered 2018-10-31: 30 via INTRAVENOUS

## 2018-10-31 MED ORDER — REGADENOSON 0.4 MG/5ML IV SOLN: 0.4000 mg | Freq: Once | INTRAVENOUS | Status: DC

## 2018-10-31 MED ORDER — TECHNETIUM TC 99M TETROFOSMIN IV KIT
10.0000 | PACK | Freq: Once | INTRAVENOUS | Status: AC | PRN
  Administered 2018-10-31: 10:00:00 10 via INTRAVENOUS

## 2018-10-31 NOTE — Progress Notes (Signed)
Tolerated stress test well 

## 2018-12-10 ENCOUNTER — Ambulatory Visit
Admission: RE | Admit: 2018-12-10 | Discharge: 2018-12-10 | Disposition: A | Discharge status: Home / Self Care | Payer: Medicare Other | Source: Ambulatory Visit | Attending: Family | Admitting: Family

## 2018-12-10 ENCOUNTER — Other Ambulatory Visit: Payer: Self-pay

## 2018-12-10 DIAGNOSIS — Z1231 Encounter for screening mammogram for malignant neoplasm of breast: Secondary | ICD-10-CM

## 2018-12-22 ENCOUNTER — Ambulatory Visit: Payer: Medicare Other
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
## 2018-12-23 ENCOUNTER — Ambulatory Visit (INDEPENDENT_AMBULATORY_CARE_PROVIDER_SITE_OTHER): Payer: Medicare Other | Admitting: Neurology

## 2018-12-23 ENCOUNTER — Other Ambulatory Visit: Payer: Self-pay

## 2018-12-23 ENCOUNTER — Encounter: Payer: Self-pay | Admitting: Neurology

## 2018-12-23 VITALS — BP 126/64 | HR 76 | Temp 97.5°F | Ht 63.0 in | Wt 192.0 lb

## 2018-12-23 DIAGNOSIS — R569 Unspecified convulsions: Secondary | ICD-10-CM | POA: Diagnosis not present

## 2018-12-23 DIAGNOSIS — G3184 Mild cognitive impairment, so stated: Secondary | ICD-10-CM | POA: Diagnosis not present

## 2018-12-23 MED ORDER — LAMOTRIGINE ER 100 MG PO TB24: 200.0000 mg | ORAL_TABLET | Freq: Every day | ORAL | 1 refills | Status: DC

## 2018-12-23 NOTE — Patient Instructions (Signed)
1) Continue current medications

## 2018-12-23 NOTE — Progress Notes (Signed)
I have reviewed and agreed above plan. 

## 2019-01-22 ENCOUNTER — Other Ambulatory Visit: Payer: Self-pay

## 2019-01-22 DIAGNOSIS — Z20822 Contact with and (suspected) exposure to covid-19: Secondary | ICD-10-CM

## 2019-01-24 LAB — NOVEL CORONAVIRUS, NAA: SARS-CoV-2, NAA: NOT DETECTED

## 2019-04-23 ENCOUNTER — Ambulatory Visit: Payer: Medicare Other | Attending: Internal Medicine

## 2019-04-23 DIAGNOSIS — Z23 Encounter for immunization: Secondary | ICD-10-CM | POA: Insufficient documentation

## 2019-04-23 NOTE — Progress Notes (Signed)
   Covid-19 Vaccination Clinic  Name:  [name]    MRN: [id number] DOB: [birth year]  04/23/2019  [name] was observed post Covid-19 immunization for 30 minutes based on pre-vaccination screening without incidence. She was provided with Vaccine Information Sheet and instruction to access the V-Safe system.   [name] was instructed to call 911 with any severe reactions post vaccine: Marland Kitchen Difficulty breathing  . Swelling of your face and throat  . A fast heartbeat  . A bad rash all over your body  . Dizziness and weakness    Immunizations Administered    Name Date Dose VIS Date Route   Pfizer COVID-19 Vaccine 04/23/2019  2:38 PM 0.3 mL 03/13/2019 Intramuscular   Manufacturer: [place]   Lot: [id number]   [place]: [id number]

## 2019-04-30 ENCOUNTER — Encounter (HOSPITAL_COMMUNITY): Payer: Self-pay | Admitting: Emergency Medicine

## 2019-04-30 ENCOUNTER — Other Ambulatory Visit: Payer: Self-pay

## 2019-04-30 ENCOUNTER — Ambulatory Visit (HOSPITAL_COMMUNITY)
Admission: EM | Admit: 2019-04-30 | Discharge: 2019-04-30 | Disposition: A | Discharge status: Home / Self Care | Payer: Medicare Other | Attending: Family Medicine | Admitting: Family Medicine

## 2019-04-30 DIAGNOSIS — K056 Periodontal disease, unspecified: Secondary | ICD-10-CM

## 2019-04-30 MED ORDER — CHLORHEXIDINE GLUCONATE 0.12 % MT SOLN: 15.0000 mL | Freq: Two times a day (BID) | OROMUCOSAL | 0 refills | Status: DC

## 2019-04-30 MED ORDER — PENICILLIN V POTASSIUM 500 MG PO TABS: 500.0000 mg | ORAL_TABLET | Freq: Four times a day (QID) | ORAL | 0 refills | Status: AC
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
## 2019-04-30 NOTE — ED Triage Notes (Signed)
Pt here for swollen upper gums x 3 weeks

## 2019-05-14 ENCOUNTER — Ambulatory Visit: Payer: Medicare Other | Attending: Internal Medicine

## 2019-05-14 DIAGNOSIS — Z23 Encounter for immunization: Secondary | ICD-10-CM | POA: Insufficient documentation

## 2019-05-14 NOTE — Progress Notes (Signed)
   Covid-19 Vaccination Clinic  Name:  [name]    MRN: [id number] DOB: [birth year]  05/14/2019  [name] was observed post Covid-19 immunization for 15 minutes without incidence. She was provided with Vaccine Information Sheet and instruction to access the V-Safe system.   [name] was instructed to call 911 with any severe reactions post vaccine: Marland Kitchen Difficulty breathing  . Swelling of your face and throat  . A fast heartbeat  . A bad rash all over your body  . Dizziness and weakness    Immunizations Administered    Name Date Dose VIS Date Route   Pfizer COVID-19 Vaccine 05/14/2019  3:03 PM 0.3 mL 03/13/2019 Intramuscular   Manufacturer: [place]   Lot: [id number]   [place]: [id number]

## 2019-05-18 ENCOUNTER — Encounter: Payer: Self-pay | Admitting: Family Medicine

## 2019-05-18 ENCOUNTER — Ambulatory Visit (INDEPENDENT_AMBULATORY_CARE_PROVIDER_SITE_OTHER): Payer: Medicare Other | Admitting: Family Medicine

## 2019-05-18 ENCOUNTER — Other Ambulatory Visit: Payer: Self-pay

## 2019-05-18 VITALS — BP 120/72 | HR 100 | Temp 95.3°F | Resp 16 | Ht 63.0 in | Wt 198.0 lb

## 2019-05-18 DIAGNOSIS — I10 Essential (primary) hypertension: Secondary | ICD-10-CM | POA: Diagnosis not present

## 2019-05-18 DIAGNOSIS — Z86711 Personal history of pulmonary embolism: Secondary | ICD-10-CM

## 2019-05-18 DIAGNOSIS — R569 Unspecified convulsions: Secondary | ICD-10-CM | POA: Diagnosis not present

## 2019-05-18 DIAGNOSIS — G4700 Insomnia, unspecified: Secondary | ICD-10-CM | POA: Diagnosis not present

## 2019-05-18 DIAGNOSIS — I890 Lymphedema, not elsewhere classified: Secondary | ICD-10-CM | POA: Diagnosis not present

## 2019-05-18 DIAGNOSIS — K5900 Constipation, unspecified: Secondary | ICD-10-CM

## 2019-05-18 DIAGNOSIS — Z6836 Body mass index (BMI) 36.0-36.9, adult: Secondary | ICD-10-CM

## 2019-05-18 DIAGNOSIS — Z780 Asymptomatic menopausal state: Secondary | ICD-10-CM

## 2019-05-18 NOTE — Patient Instructions (Addendum)
A few things to remember from today's visit:   Essential hypertension  Insomnia, unspecified type  Lymphedema of both lower extremities  Seizure ([name]), Chronic  Class 2 severe obesity due to excess calories with serious comorbidity and body mass index (BMI) of 36.0 to 36.9 in adult [facility]), Chronic  Constipation, unspecified constipation type  Melatonin 1-2 hours after dinner. Miralax at bedtime if needed.   Please be sure medication list is accurate. If a new problem present, please set up appointment sooner than planned today.
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
## 2019-05-21 ENCOUNTER — Encounter: Payer: Self-pay | Admitting: Family Medicine

## 2019-06-11 ENCOUNTER — Encounter (HOSPITAL_COMMUNITY): Payer: Self-pay

## 2019-06-11 ENCOUNTER — Ambulatory Visit (HOSPITAL_COMMUNITY)
Admission: EM | Admit: 2019-06-11 | Discharge: 2019-06-11 | Disposition: A | Discharge status: Home / Self Care | Payer: Medicare Other | Attending: Family Medicine | Admitting: Family Medicine

## 2019-06-11 ENCOUNTER — Other Ambulatory Visit: Payer: Self-pay

## 2019-06-11 DIAGNOSIS — S61211A Laceration without foreign body of left index finger without damage to nail, initial encounter: Secondary | ICD-10-CM | POA: Diagnosis not present

## 2019-06-11 DIAGNOSIS — W269XXA Contact with unspecified sharp object(s), initial encounter: Secondary | ICD-10-CM

## 2019-06-11 DIAGNOSIS — Z23 Encounter for immunization: Secondary | ICD-10-CM | POA: Diagnosis not present

## 2019-06-11 DIAGNOSIS — S61210A Laceration without foreign body of right index finger without damage to nail, initial encounter: Secondary | ICD-10-CM

## 2019-06-11 MED ORDER — TETANUS-DIPHTHERIA TOXOIDS TD 5-2 LFU IM INJ: 0.5000 mL | INJECTION | Freq: Once | INTRAMUSCULAR | Status: DC

## 2019-06-11 MED ORDER — TETANUS-DIPHTH-ACELL PERTUSSIS 5-2.5-18.5 LF-MCG/0.5 IM SUSP
INTRAMUSCULAR | Status: AC
  Filled 2019-06-11: qty 0.5

## 2019-06-11 MED ORDER — LIDOCAINE-EPINEPHRINE (PF) 2 %-1:200000 IJ SOLN
INTRAMUSCULAR | Status: AC
  Filled 2019-06-11: qty 20

## 2019-06-11 MED ORDER — TETANUS-DIPHTH-ACELL PERTUSSIS 5-2.5-18.5 LF-MCG/0.5 IM SUSP
0.5000 mL | Freq: Once | INTRAMUSCULAR | Status: AC
  Administered 2019-06-11: 0.5 mL via INTRAMUSCULAR

## 2019-06-11 NOTE — ED Triage Notes (Signed)
Pt presents with laceration on left index finger from kitchen knife.
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
## 2019-06-11 NOTE — Discharge Instructions (Signed)
Keep pressure dressing in place for 12 hours or until after 9:00 am tomorrow. Then transition to regular bandage when out in public. While at home as long as finger is not bleeding, you may leave bandage off. If you are doing any work around the house or cooking keep finger bandage to prevent infection. Return here in 7 days to have sutures removed. If bleeding reoccurs, return here for evaluation.

## 2019-06-12 ENCOUNTER — Telehealth: Payer: Self-pay | Admitting: Family Medicine

## 2019-06-12 ENCOUNTER — Other Ambulatory Visit: Payer: Self-pay

## 2019-06-12 MED ORDER — ALBUTEROL SULFATE HFA 108 (90 BASE) MCG/ACT IN AERS: INHALATION_SPRAY | RESPIRATORY_TRACT | 8 refills | Status: DC

## 2019-06-12 MED ORDER — SPIRONOLACTONE 25 MG PO TABS: 25.0000 mg | ORAL_TABLET | Freq: Every day | ORAL | 1 refills | Status: DC

## 2019-06-12 MED ORDER — NORTRIPTYLINE HCL 25 MG PO CAPS: 25.0000 mg | ORAL_CAPSULE | Freq: Every day | ORAL | 1 refills | Status: DC

## 2019-06-12 MED ORDER — APIXABAN 5 MG PO TABS: 5.0000 mg | ORAL_TABLET | Freq: Two times a day (BID) | ORAL | 1 refills | Status: DC

## 2019-06-12 MED ORDER — FUROSEMIDE 40 MG PO TABS: 40.0000 mg | ORAL_TABLET | Freq: Every day | ORAL | 1 refills | Status: DC

## 2019-06-12 MED ORDER — IRBESARTAN 300 MG PO TABS: 300.0000 mg | ORAL_TABLET | Freq: Every day | ORAL | 1 refills | Status: DC

## 2019-06-12 MED ORDER — PANTOPRAZOLE SODIUM 40 MG PO TBEC: DELAYED_RELEASE_TABLET | ORAL | 1 refills | Status: DC

## 2019-06-12 MED ORDER — ATORVASTATIN CALCIUM 40 MG PO TABS: 40.0000 mg | ORAL_TABLET | Freq: Every day | ORAL | 1 refills | Status: DC

## 2019-06-12 NOTE — Telephone Encounter (Signed)
Rx's refilled. Seizure medication forwarded to pt's neurologist.

## 2019-06-12 NOTE — Telephone Encounter (Signed)
pt requesting a refill on ALL of the following meds  [facility] [phone] - [place], [address] AT [address]  Phone:  [phone] Fax:  [phone]  albuterol (PROAIR HFA) 108 (90 Base) MCG/ACT inhaler atorvastatin (LIPITOR) 40 MG tablet ELIQUIS 5 MG TABS tablet furosemide (LASIX) 40 MG tablet irbesartan (AVAPRO) 300 MG tablet LamoTRIgine 100 MG TB24 24 hour tablet Multiple Vitamin (MULTIVITAMIN) tablet nitroGLYCERIN (NITROSTAT) 0.4 MG SL tablet nortriptyline (PAMELOR) 25 MG capsule pantoprazole (PROTONIX) 40 MG tablet spironolactone (ALDACTONE) 25 MG tablet

## 2019-06-13 DIAGNOSIS — S61211A Laceration without foreign body of left index finger without damage to nail, initial encounter: Secondary | ICD-10-CM | POA: Diagnosis not present

## 2019-06-15 MED ORDER — LAMOTRIGINE ER 100 MG PO TB24: 200.0000 mg | ORAL_TABLET | Freq: Every day | ORAL | 1 refills | Status: DC

## 2019-06-18 ENCOUNTER — Ambulatory Visit (HOSPITAL_COMMUNITY)
Admission: EM | Admit: 2019-06-18 | Discharge: 2019-06-18 | Disposition: A | Discharge status: Home / Self Care | Payer: Medicare Other

## 2019-06-18 DIAGNOSIS — Z4802 Encounter for removal of sutures: Secondary | ICD-10-CM

## 2019-06-18 NOTE — ED Triage Notes (Signed)
Patient tolerated suture removal well. Removed four sutures. Wrapped finger with sterile 2x2 and coband. Instructed patient to leave dressing on for two hours. Patient educated about keeping area clean and dry. Patient verbalized understanding.
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
## 2019-06-23 ENCOUNTER — Ambulatory Visit (INDEPENDENT_AMBULATORY_CARE_PROVIDER_SITE_OTHER): Payer: Medicare Other | Admitting: Neurology

## 2019-06-23 ENCOUNTER — Other Ambulatory Visit: Payer: Self-pay

## 2019-06-23 ENCOUNTER — Encounter: Payer: Self-pay | Admitting: Neurology

## 2019-06-23 VITALS — BP 118/68 | HR 81 | Temp 97.3°F | Ht 63.0 in | Wt 200.0 lb

## 2019-06-23 DIAGNOSIS — R569 Unspecified convulsions: Secondary | ICD-10-CM | POA: Diagnosis not present

## 2019-06-23 DIAGNOSIS — G3184 Mild cognitive impairment, so stated: Secondary | ICD-10-CM | POA: Diagnosis not present

## 2019-06-23 MED ORDER — LAMOTRIGINE ER 100 MG PO TB24: 200.0000 mg | ORAL_TABLET | Freq: Every day | ORAL | 3 refills | Status: DC

## 2019-06-23 MED ORDER — MEMANTINE HCL 5 MG PO TABS: ORAL_TABLET | ORAL | 0 refills | Status: DC

## 2019-06-23 NOTE — Patient Instructions (Signed)
Start Namenda for memory   Take 1 tablet daily for one week, then take 1 tablet twice daily for one week, then take 1 tablet in the morning and 2 in the evening for one week, then take 2 tablets twice daily  Once complete titration after 1 month, call and I will send in the new prescription   Continue Lamictal   Call for seizure , see you in 6 months

## 2019-06-23 NOTE — Progress Notes (Signed)
I have reviewed and agreed above plan. 

## 2019-06-24 ENCOUNTER — Telehealth: Payer: Self-pay | Admitting: *Deleted

## 2019-06-24 IMAGING — CT CT HEAD W/O CM
4 series · 16 of 47 positions shown, 18 images · non-contrast
Comparison: 12/19/2014

CLINICAL DATA: Left side numbness.

EXAM:
CT HEAD WITHOUT CONTRAST
TECHNIQUE: Contiguous axial images were obtained from the base of the skull
through the vertex without intravenous contrast.

[Series 3: head wo · axial · 0.45mm/px · z∈[-86,+34]mm · 7 of 33 slices shown, 9 images]
[im 5/33  brain]
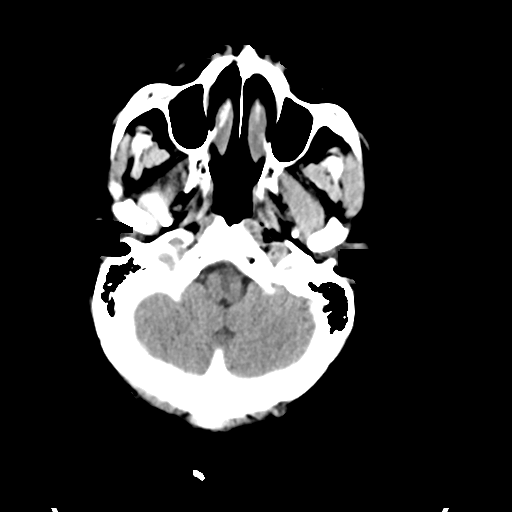
[im 5/33  bone]
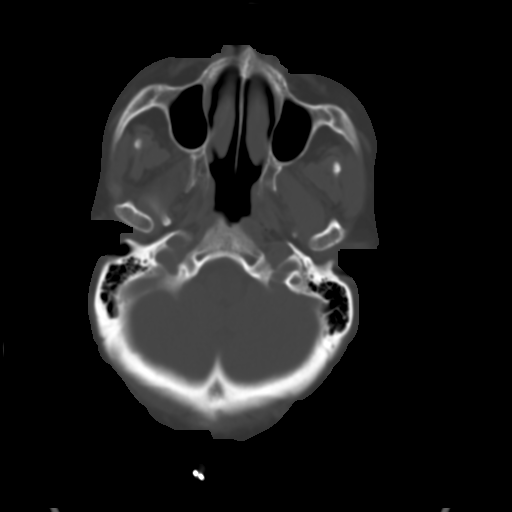
[im 9/33  brain]
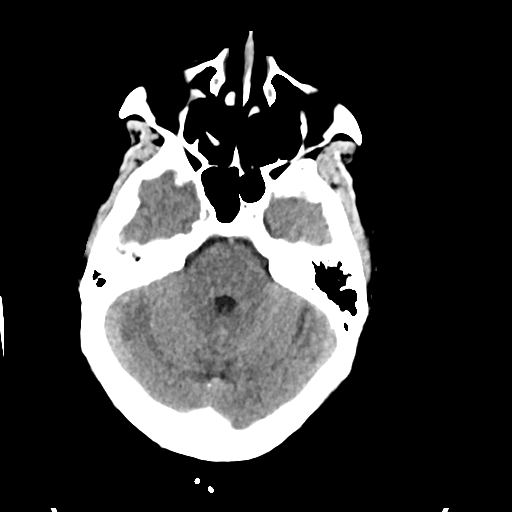
[im 13/33  brain]
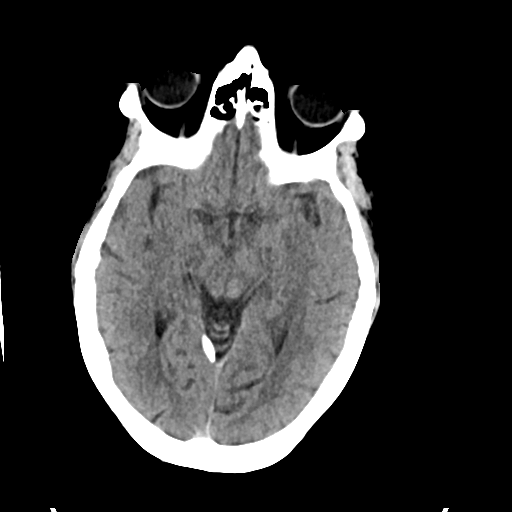
[im 17/33  brain]
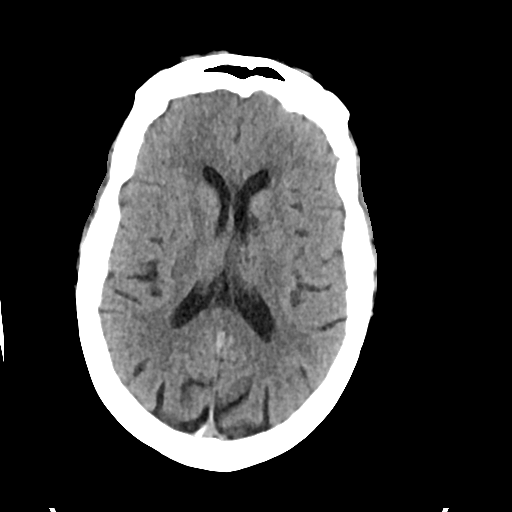
[im 21/33  brain]
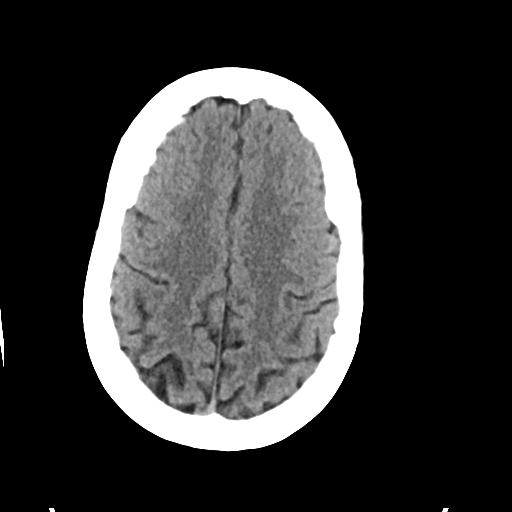
[im 21/33  bone]
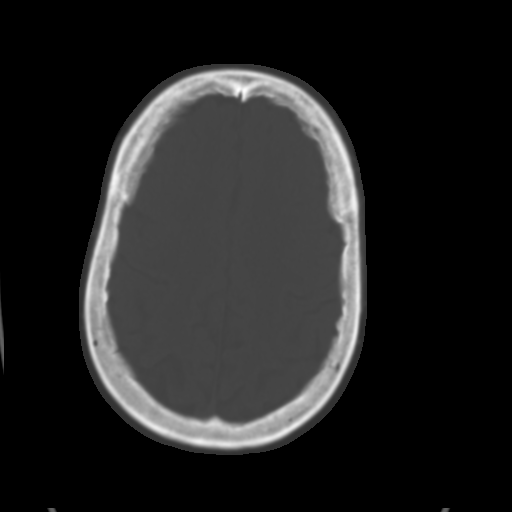
[im 25/33  brain]
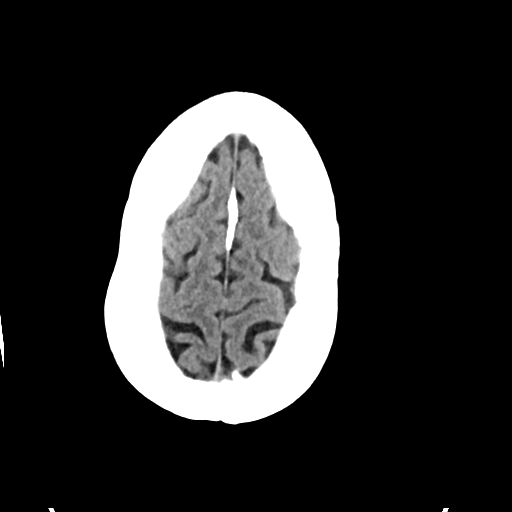
[im 29/33  brain]
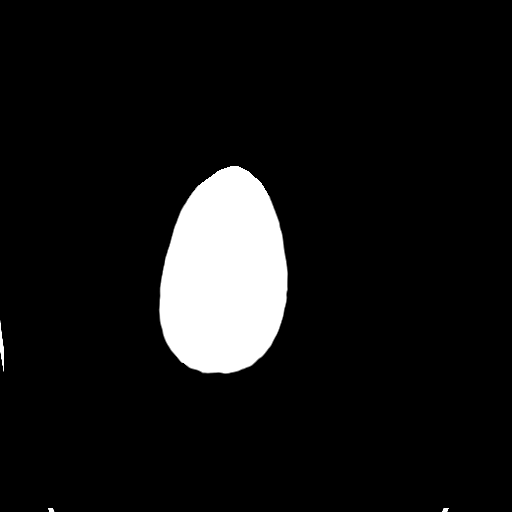

[Series 4: head bone · axial · 0.45mm/px · z∈[-90,-58]mm · 3 of 81 slices shown]
[im 9/81  bone]
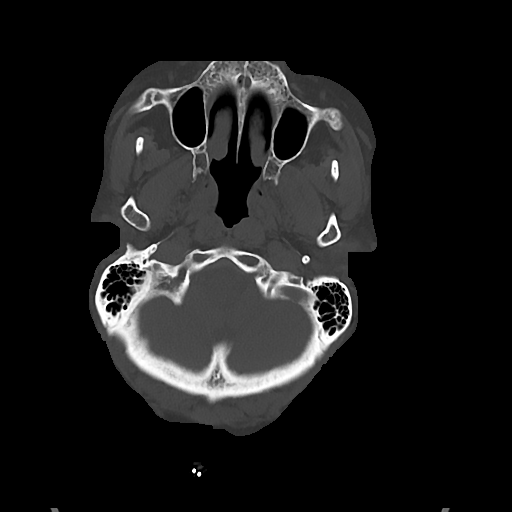
[im 17/81  bone]
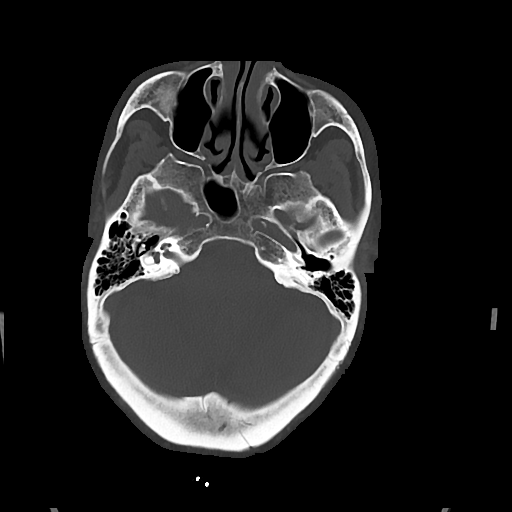
[im 25/81  bone]
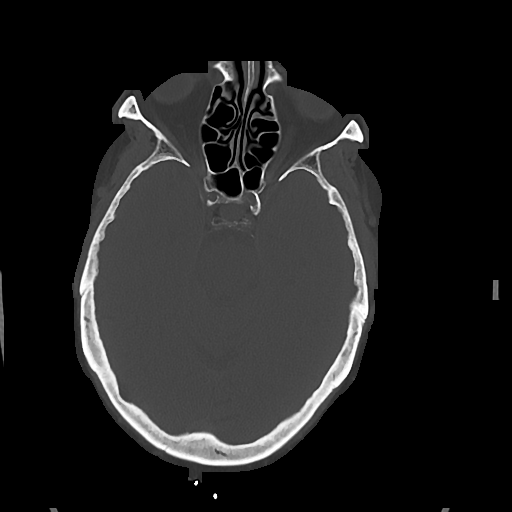

[Series 5: cor soft · coronal · 0.30mm/px · 3 of 75 slices shown]
[im 25/75  brain]
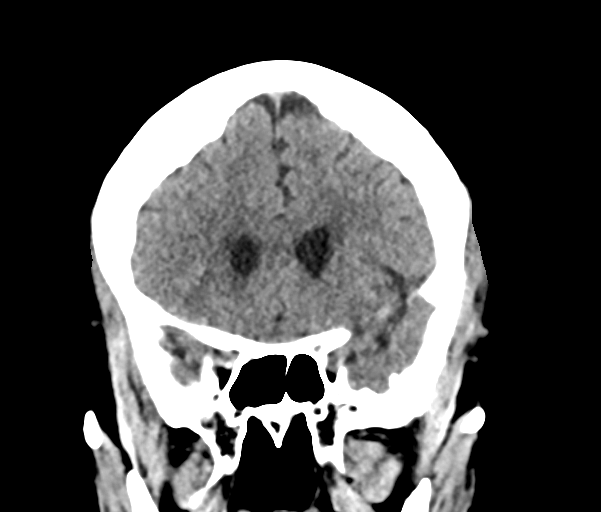
[im 33/75  brain]
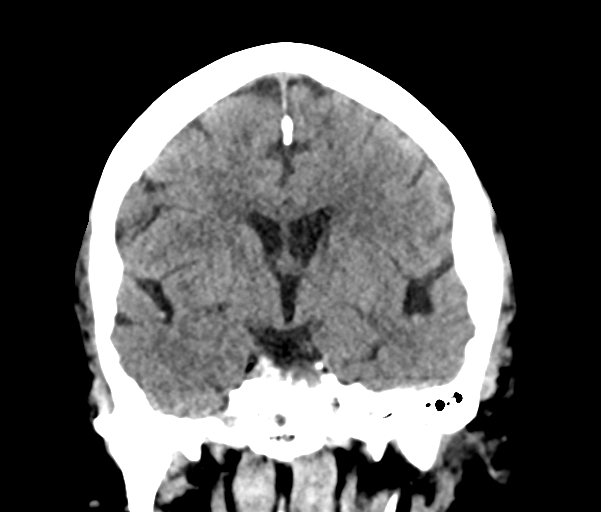
[im 42/75  brain]
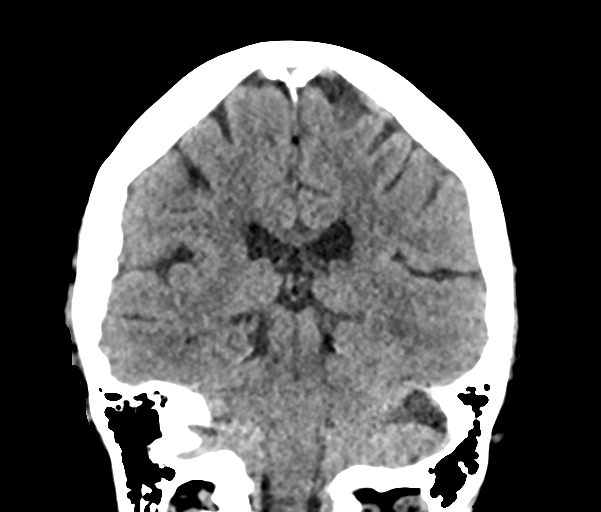

[Series 6: sag soft · sagittal · 0.31mm/px · 3 of 67 slices shown]
[im 23/67  brain]
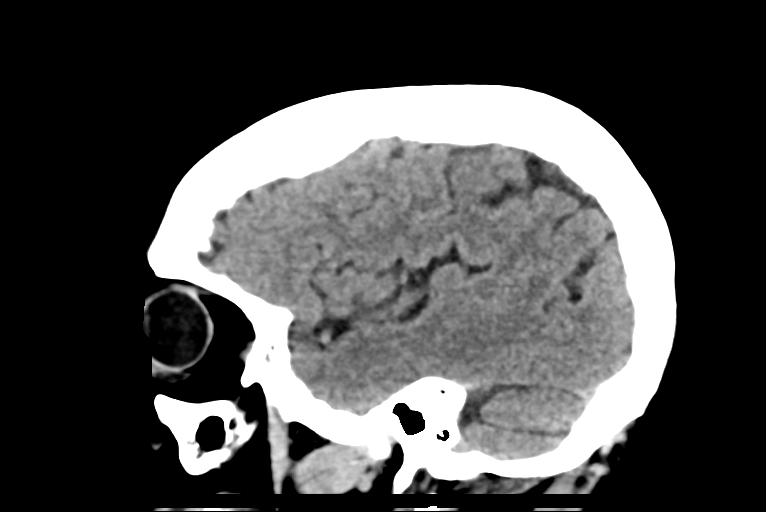
[im 34/67  brain]
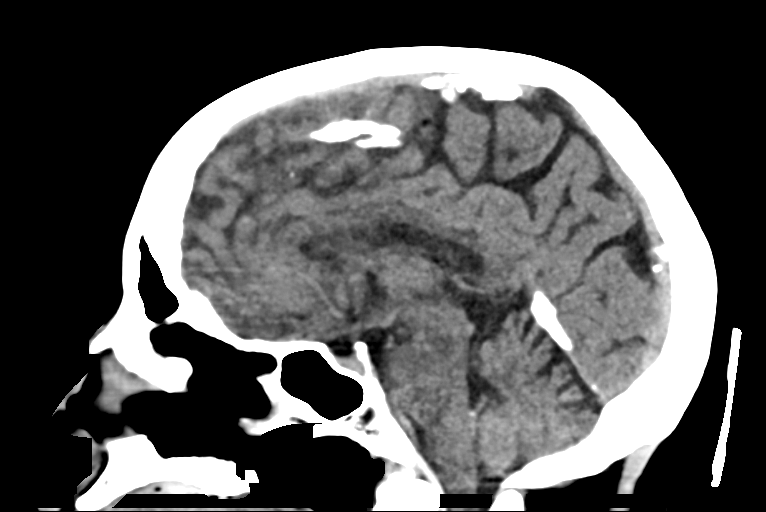
[im 45/67  brain]
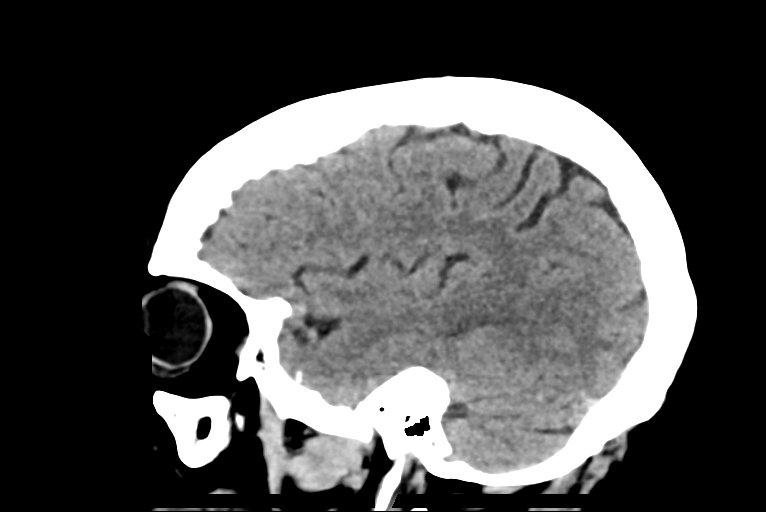

[16 of 47 positions shown; findings below may reference images not displayed]

FINDINGS: Brain: Mild age related volume loss. No acute intracranial
abnormality. Specifically, no hemorrhage, hydrocephalus, mass
lesion, acute infarction, or significant intracranial injury.

Vascular: No hyperdense vessel or unexpected calcification.

Skull: No acute calvarial abnormality.

Sinuses/Orbits: Visualized paranasal sinuses and mastoids clear.
Orbital soft tissues unremarkable.

Other: None
IMPRESSION: No acute intracranial abnormality.  No change.

## 2019-06-24 NOTE — Telephone Encounter (Signed)
Received request for PA on Lamotrigine ER (as not cover this mediation. AARP ID [id number] BIN [id number] PCN [id number], GRP COS. Initiated on [place].   APPROVED  Thru 04-01-20. [facility]  [facility] [phone] confirmation fax received.

## 2019-07-13 ENCOUNTER — Other Ambulatory Visit: Payer: Self-pay | Admitting: Family Medicine

## 2019-07-22 ENCOUNTER — Other Ambulatory Visit: Payer: Self-pay | Admitting: *Deleted

## 2019-07-22 ENCOUNTER — Emergency Department (HOSPITAL_COMMUNITY): Payer: Medicare Other

## 2019-07-22 ENCOUNTER — Encounter (HOSPITAL_COMMUNITY): Payer: Self-pay | Admitting: Emergency Medicine

## 2019-07-22 ENCOUNTER — Other Ambulatory Visit: Payer: Self-pay

## 2019-07-22 ENCOUNTER — Telehealth: Payer: Self-pay | Admitting: Family Medicine

## 2019-07-22 ENCOUNTER — Emergency Department (HOSPITAL_COMMUNITY)
Admission: EM | Admit: 2019-07-22 | Discharge: 2019-07-22 | Disposition: A | Discharge status: Home / Self Care | Payer: Medicare Other | Attending: Emergency Medicine | Admitting: Emergency Medicine

## 2019-07-22 DIAGNOSIS — R072 Precordial pain: Secondary | ICD-10-CM | POA: Diagnosis present

## 2019-07-22 DIAGNOSIS — I2510 Atherosclerotic heart disease of native coronary artery without angina pectoris: Secondary | ICD-10-CM | POA: Diagnosis not present

## 2019-07-22 DIAGNOSIS — I509 Heart failure, unspecified: Secondary | ICD-10-CM | POA: Diagnosis not present

## 2019-07-22 DIAGNOSIS — Z7901 Long term (current) use of anticoagulants: Secondary | ICD-10-CM | POA: Diagnosis not present

## 2019-07-22 DIAGNOSIS — N183 Chronic kidney disease, stage 3 unspecified: Secondary | ICD-10-CM | POA: Insufficient documentation

## 2019-07-22 DIAGNOSIS — Z7722 Contact with and (suspected) exposure to environmental tobacco smoke (acute) (chronic): Secondary | ICD-10-CM | POA: Insufficient documentation

## 2019-07-22 DIAGNOSIS — Z86718 Personal history of other venous thrombosis and embolism: Secondary | ICD-10-CM | POA: Diagnosis not present

## 2019-07-22 DIAGNOSIS — I130 Hypertensive heart and chronic kidney disease with heart failure and stage 1 through stage 4 chronic kidney disease, or unspecified chronic kidney disease: Secondary | ICD-10-CM | POA: Diagnosis not present

## 2019-07-22 LAB — CBC
HCT: 41.3 % (ref 36.0–46.0)
Hemoglobin: 12.6 g/dL (ref 12.0–15.0)
MCH: 28.7 pg (ref 26.0–34.0)
MCHC: 30.5 g/dL (ref 30.0–36.0)
MCV: 94.1 fL (ref 80.0–100.0)
Platelets: 251 10*3/uL (ref 150–400)
RBC: 4.39 MIL/uL (ref 3.87–5.11)
RDW: 14.1 % (ref 11.5–15.5)
WBC: 8.7 10*3/uL (ref 4.0–10.5)
nRBC: 0 % (ref 0.0–0.2)

## 2019-07-22 LAB — BASIC METABOLIC PANEL
Anion gap: 10 (ref 5–15)
BUN: 16 mg/dL (ref 8–23)
CO2: 27 mmol/L (ref 22–32)
Calcium: 9.4 mg/dL (ref 8.9–10.3)
Chloride: 102 mmol/L (ref 98–111)
Creatinine, Ser: 1.51 mg/dL — ABNORMAL HIGH (ref 0.44–1.00)
GFR calc Af Amer: 38 mL/min — ABNORMAL LOW (ref >60)
GFR calc non Af Amer: 33 mL/min — ABNORMAL LOW (ref >60)
Glucose, Bld: 122 mg/dL — ABNORMAL HIGH (ref 70–99)
Potassium: 4.6 mmol/L (ref 3.5–5.1)
Sodium: 139 mmol/L (ref 135–145)

## 2019-07-22 LAB — TROPONIN I (HIGH SENSITIVITY)
Troponin I (High Sensitivity): 4 ng/L (ref <18)
Troponin I (High Sensitivity): 3 ng/L (ref <18)

## 2019-07-22 MED ORDER — SODIUM CHLORIDE 0.9% FLUSH: 3.0000 mL | Freq: Once | INTRAVENOUS | Status: DC

## 2019-07-22 NOTE — ED Notes (Signed)
Pt discharged at this time. Discharge instructions reviewed, opportunity to ask questions provided, pt verbalizes understanding of discharge instructions.   

## 2019-07-22 NOTE — Telephone Encounter (Signed)
Pt states she is out of this medication and needs a 90-day refill sent today.   Medication Refill: Carvedilol 25 mg Pharmacy: [facility]: [phone]
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
## 2019-07-22 NOTE — Discharge Instructions (Signed)
You were seen in the emergency room today with chest discomfort.  Your lab work here is normal.  You are safe to return home at this time but I would like for you to call your cardiologist today.  I spoke with him on the phone and they would like to see you in the office tomorrow.  Please call the secretary to schedule this.  If you develop new or worsening chest pain, shortness of breath, weakness/numbness you should return to the emergency department immediately and/or call 911.

## 2019-07-22 NOTE — Telephone Encounter (Signed)
Returned call to patient and informed her that on 05/18/19, it looked like her medication was discontinued. Patient stated that the cardiologist originally prescribed it and that she has an appointment with them on 07/23/2019 and she will ask them about the medication tomorrow. Nothing further needed at this time.

## 2019-07-22 NOTE — ED Triage Notes (Signed)
Pt c/o 7/10 right side cp that started this morning, denies SOB, nausea or vomiting.

## 2019-07-24 ENCOUNTER — Telehealth: Payer: Self-pay | Admitting: Family Medicine

## 2019-07-24 NOTE — Telephone Encounter (Signed)
Message Routed to PCP for review. 

## 2019-07-24 NOTE — Telephone Encounter (Signed)
Spoke with patient and read the note that was sent and patient stated that the information was incorrect. Patient stated that she did not want Dr. Martinique to confirm that she had neuropathy and Parkinson's, she stated that she goes to neurology. Patient also stated that at her appointment on 09/15/2019 she had a list of concerns she wanted to discuss with Dr. Martinique. Nothing further needed at this time.

## 2019-07-24 NOTE — Telephone Encounter (Signed)
Pt insurance called with pt on the line to request we list what pt wanted to discuss at her next appt. She would like pcp to confirm that she has parkinson's disease and neuropathy. She would also like a call back with advice for health heart choices and recepies.

## 2019-07-24 NOTE — Telephone Encounter (Signed)
She follows with neuro for seizure disorder. If she has concerns about possible Parkinson's disease she can discuss this with her neurologist. Thanks, BJ

## 2019-08-08 ENCOUNTER — Encounter (HOSPITAL_COMMUNITY): Payer: Self-pay

## 2019-08-08 ENCOUNTER — Other Ambulatory Visit: Payer: Self-pay

## 2019-08-08 ENCOUNTER — Emergency Department (HOSPITAL_COMMUNITY): Payer: Medicare Other

## 2019-08-08 ENCOUNTER — Emergency Department (HOSPITAL_COMMUNITY)
Admission: EM | Admit: 2019-08-08 | Discharge: 2019-08-08 | Disposition: A | Discharge status: Home / Self Care | Payer: Medicare Other | Attending: Emergency Medicine | Admitting: Emergency Medicine

## 2019-08-08 DIAGNOSIS — R079 Chest pain, unspecified: Secondary | ICD-10-CM | POA: Diagnosis not present

## 2019-08-08 DIAGNOSIS — Z7901 Long term (current) use of anticoagulants: Secondary | ICD-10-CM | POA: Diagnosis not present

## 2019-08-08 DIAGNOSIS — R0789 Other chest pain: Secondary | ICD-10-CM | POA: Diagnosis not present

## 2019-08-08 DIAGNOSIS — Z7722 Contact with and (suspected) exposure to environmental tobacco smoke (acute) (chronic): Secondary | ICD-10-CM | POA: Insufficient documentation

## 2019-08-08 DIAGNOSIS — Z96651 Presence of right artificial knee joint: Secondary | ICD-10-CM | POA: Insufficient documentation

## 2019-08-08 DIAGNOSIS — Z79899 Other long term (current) drug therapy: Secondary | ICD-10-CM | POA: Diagnosis not present

## 2019-08-08 DIAGNOSIS — Z8673 Personal history of transient ischemic attack (TIA), and cerebral infarction without residual deficits: Secondary | ICD-10-CM | POA: Insufficient documentation

## 2019-08-08 DIAGNOSIS — I10 Essential (primary) hypertension: Secondary | ICD-10-CM | POA: Insufficient documentation

## 2019-08-08 DIAGNOSIS — J45909 Unspecified asthma, uncomplicated: Secondary | ICD-10-CM | POA: Diagnosis not present

## 2019-08-08 LAB — BASIC METABOLIC PANEL
Anion gap: 8 (ref 5–15)
BUN: 16 mg/dL (ref 8–23)
CO2: 24 mmol/L (ref 22–32)
Calcium: 9.2 mg/dL (ref 8.9–10.3)
Chloride: 105 mmol/L (ref 98–111)
Creatinine, Ser: 1.41 mg/dL — ABNORMAL HIGH (ref 0.44–1.00)
GFR calc Af Amer: 42 mL/min — ABNORMAL LOW (ref >60)
GFR calc non Af Amer: 36 mL/min — ABNORMAL LOW (ref >60)
Glucose, Bld: 105 mg/dL — ABNORMAL HIGH (ref 70–99)
Potassium: 4.7 mmol/L (ref 3.5–5.1)
Sodium: 137 mmol/L (ref 135–145)

## 2019-08-08 LAB — CBC
HCT: 40.4 % (ref 36.0–46.0)
Hemoglobin: 12.4 g/dL (ref 12.0–15.0)
MCH: 29.2 pg (ref 26.0–34.0)
MCHC: 30.7 g/dL (ref 30.0–36.0)
MCV: 95.1 fL (ref 80.0–100.0)
Platelets: 229 10*3/uL (ref 150–400)
RBC: 4.25 MIL/uL (ref 3.87–5.11)
RDW: 13.9 % (ref 11.5–15.5)
WBC: 7 10*3/uL (ref 4.0–10.5)
nRBC: 0 % (ref 0.0–0.2)

## 2019-08-08 LAB — TROPONIN I (HIGH SENSITIVITY): Troponin I (High Sensitivity): 3 ng/L (ref <18)

## 2019-08-08 MED ORDER — SODIUM CHLORIDE 0.9% FLUSH: 3.0000 mL | Freq: Once | INTRAVENOUS | Status: DC

## 2019-08-08 NOTE — Discharge Instructions (Signed)
Regarding regarding the episode of pain that you experience today, recommend that you follow-up with your primary care doctor.  If you have recurrent chest pain, difficulty in breathing, passing out or other new concerning symptom, recommend return to ER for reassessment at that time.

## 2019-08-08 NOTE — ED Notes (Signed)
Patient verbalizes understanding of discharge instructions . Opportunity for questions and answers were provided . Armband removed by staff ,Pt discharged from ED. W/C  offered at D/C  and Declined W/C at D/C and was escorted to lobby by RN.  

## 2019-08-08 NOTE — ED Triage Notes (Signed)
Onset 3 days chest pain.  Denies shortness of breath, nausea, diaphoresis, or radiation.  Pain is intermittant and occurring more frequently since onset.
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
## 2019-08-12 ENCOUNTER — Other Ambulatory Visit: Payer: Self-pay

## 2019-08-12 ENCOUNTER — Ambulatory Visit
Admission: RE | Admit: 2019-08-12 | Discharge: 2019-08-12 | Disposition: A | Discharge status: Home / Self Care | Payer: Medicare Other | Source: Ambulatory Visit | Attending: Family Medicine | Admitting: Family Medicine

## 2019-08-12 DIAGNOSIS — Z780 Asymptomatic menopausal state: Secondary | ICD-10-CM

## 2019-08-12 DIAGNOSIS — M85851 Other specified disorders of bone density and structure, right thigh: Secondary | ICD-10-CM | POA: Diagnosis not present

## 2019-09-03 ENCOUNTER — Other Ambulatory Visit: Payer: Self-pay | Admitting: *Deleted

## 2019-09-03 MED ORDER — MEMANTINE HCL 10 MG PO TABS: 10.0000 mg | ORAL_TABLET | Freq: Two times a day (BID) | ORAL | 11 refills | Status: DC

## 2019-09-14 ENCOUNTER — Other Ambulatory Visit: Payer: Self-pay

## 2019-09-15 ENCOUNTER — Encounter: Payer: Self-pay | Admitting: Family Medicine

## 2019-09-15 ENCOUNTER — Ambulatory Visit (INDEPENDENT_AMBULATORY_CARE_PROVIDER_SITE_OTHER): Payer: Medicare Other | Admitting: Family Medicine

## 2019-09-15 ENCOUNTER — Other Ambulatory Visit (INDEPENDENT_AMBULATORY_CARE_PROVIDER_SITE_OTHER): Payer: Medicare Other

## 2019-09-15 VITALS — BP 110/72 | HR 73 | Temp 97.5°F | Resp 16 | Ht 63.0 in | Wt 203.5 lb

## 2019-09-15 DIAGNOSIS — I10 Essential (primary) hypertension: Secondary | ICD-10-CM

## 2019-09-15 DIAGNOSIS — R200 Anesthesia of skin: Secondary | ICD-10-CM

## 2019-09-15 DIAGNOSIS — R0602 Shortness of breath: Secondary | ICD-10-CM

## 2019-09-15 DIAGNOSIS — R7303 Prediabetes: Secondary | ICD-10-CM | POA: Diagnosis not present

## 2019-09-15 DIAGNOSIS — R202 Paresthesia of skin: Secondary | ICD-10-CM

## 2019-09-15 DIAGNOSIS — N1832 Chronic kidney disease, stage 3b: Secondary | ICD-10-CM | POA: Diagnosis not present

## 2019-09-15 DIAGNOSIS — M797 Fibromyalgia: Secondary | ICD-10-CM

## 2019-09-15 DIAGNOSIS — M159 Polyosteoarthritis, unspecified: Secondary | ICD-10-CM

## 2019-09-15 LAB — CBC
HCT: 38.3 % (ref 36.0–46.0)
Hemoglobin: 12.6 g/dL (ref 12.0–15.0)
MCHC: 32.9 g/dL (ref 30.0–36.0)
MCV: 90 fl (ref 78.0–100.0)
Platelets: 205 10*3/uL (ref 150.0–400.0)
RBC: 4.25 Mil/uL (ref 3.87–5.11)
RDW: 13.9 % (ref 11.5–15.5)
WBC: 7.1 10*3/uL (ref 4.0–10.5)

## 2019-09-15 LAB — TSH: TSH: 2.3 u[IU]/mL (ref 0.35–4.50)

## 2019-09-15 LAB — HEMOGLOBIN A1C: Hgb A1c MFr Bld: 6.9 % — ABNORMAL HIGH (ref 4.6–6.5)

## 2019-09-15 LAB — VITAMIN B12: Vitamin B-12: 1012 pg/mL — ABNORMAL HIGH (ref 211–911)

## 2019-09-15 NOTE — Patient Instructions (Signed)
A few things to remember from today's visit: Wean off Nortriptyline, decrease frequency 1 tab every other day x 10 days then every 3rd day for 7 days and stop it. I am not changing the rest of your meds for now.  Labs today at [facility].  If you need refills please call your pharmacy. Do not use My Chart to request refills or for acute issues that need immediate attention.    Please be sure medication list is accurate. If a new problem present, please set up appointment sooner than planned today.
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
## 2019-09-19 ENCOUNTER — Encounter: Payer: Self-pay | Admitting: Family Medicine

## 2019-09-25 ENCOUNTER — Other Ambulatory Visit: Payer: Self-pay | Admitting: Family Medicine

## 2019-10-04 ENCOUNTER — Ambulatory Visit (HOSPITAL_COMMUNITY)
Admission: EM | Admit: 2019-10-04 | Discharge: 2019-10-04 | Disposition: A | Discharge status: Home / Self Care | Payer: Medicare Other | Attending: Family Medicine | Admitting: Family Medicine

## 2019-10-04 ENCOUNTER — Encounter (HOSPITAL_COMMUNITY): Payer: Self-pay | Admitting: Family Medicine

## 2019-10-04 ENCOUNTER — Other Ambulatory Visit: Payer: Self-pay

## 2019-10-04 DIAGNOSIS — G8929 Other chronic pain: Secondary | ICD-10-CM | POA: Diagnosis not present

## 2019-10-04 DIAGNOSIS — M25511 Pain in right shoulder: Secondary | ICD-10-CM | POA: Diagnosis not present

## 2019-10-04 DIAGNOSIS — M542 Cervicalgia: Secondary | ICD-10-CM

## 2019-10-04 MED ORDER — PREDNISONE 10 MG PO TABS
10.0000 mg | ORAL_TABLET | Freq: Two times a day (BID) | ORAL | 0 refills | Status: DC
Start: 2019-10-04 — End: 2020-01-25
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
## 2019-10-04 NOTE — ED Triage Notes (Addendum)
Patient presents today with complaints of right shoulder and arm pain that began today - no injury. Patient states she has had right shoulder pain on/off for the past couple of months but it got worse today. Patient states she has not taken any OTC medications for the pain. Patient states she does not know if it is arthritis or not.

## 2019-10-04 NOTE — Discharge Instructions (Addendum)
Call your doctor if you don't improve in 2 days

## 2019-10-11 ENCOUNTER — Ambulatory Visit (HOSPITAL_COMMUNITY)
Admission: EM | Admit: 2019-10-11 | Discharge: 2019-10-11 | Disposition: A | Discharge status: Home / Self Care | Payer: Medicare Other | Attending: Family Medicine | Admitting: Family Medicine

## 2019-10-11 ENCOUNTER — Other Ambulatory Visit: Payer: Self-pay

## 2019-10-11 ENCOUNTER — Encounter (HOSPITAL_COMMUNITY): Payer: Self-pay

## 2019-10-11 DIAGNOSIS — R1110 Vomiting, unspecified: Secondary | ICD-10-CM | POA: Diagnosis not present

## 2019-10-11 DIAGNOSIS — R1031 Right lower quadrant pain: Secondary | ICD-10-CM | POA: Insufficient documentation

## 2019-10-11 LAB — POCT URINALYSIS DIP (DEVICE)
Bilirubin Urine: NEGATIVE
Glucose, UA: NEGATIVE mg/dL
Ketones, ur: NEGATIVE mg/dL
Nitrite: NEGATIVE
Protein, ur: 30 mg/dL — AB
Specific Gravity, Urine: 1.02 (ref 1.005–1.030)
Urobilinogen, UA: 0.2 mg/dL (ref 0.0–1.0)
pH: 5.5 (ref 5.0–8.0)

## 2019-10-11 NOTE — ED Triage Notes (Signed)
Pt c/o right flank pain radiating to RLQ for past several days. Pt states she started taking 40mg  Lasix every day after advice of a "friend who's a nurse who saw my bottle of it". Pt states she was Rx lasix in March but then 'the doctor told me to step away from it". Pt also c/o subjective fever last night. C/o general malaise/weakness.  Pt reports burning on urination last week for a few days.   Nausea/vomiting yesterday. Pt states she took prune juice yesterday and then had diarrhea.

## 2019-10-11 NOTE — Discharge Instructions (Signed)
Rx Miralax for constipation - start with 1/2 cap daily and may increase to 1 cap daily to achieve 1-2 soft stools per day.  Continue MIralax for ~6 months to allow the rectum to return to normal caliber.   Increase fruits, veggies, and water in diet.   Supportive cares, return precautions, and emergency procedures reviewed.  Please seek immediate care if your symptoms worsen  Please follow up if your symptoms fail to improve.
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
## 2019-10-13 LAB — URINE CULTURE: Culture: >=100000 — AB

## 2019-10-14 ENCOUNTER — Telehealth (HOSPITAL_COMMUNITY): Payer: Self-pay

## 2019-10-14 MED ORDER — CEPHALEXIN 500 MG PO CAPS
500.0000 mg | ORAL_CAPSULE | Freq: Two times a day (BID) | ORAL | 0 refills | Status: AC
Start: 2019-10-14 — End: 2019-10-21

## 2019-10-19 DIAGNOSIS — I10 Essential (primary) hypertension: Secondary | ICD-10-CM | POA: Diagnosis not present

## 2019-10-19 DIAGNOSIS — E785 Hyperlipidemia, unspecified: Secondary | ICD-10-CM | POA: Diagnosis not present

## 2019-10-19 DIAGNOSIS — I2699 Other pulmonary embolism without acute cor pulmonale: Secondary | ICD-10-CM | POA: Diagnosis not present

## 2019-10-19 DIAGNOSIS — I2510 Atherosclerotic heart disease of native coronary artery without angina pectoris: Secondary | ICD-10-CM | POA: Diagnosis not present

## 2019-10-21 ENCOUNTER — Other Ambulatory Visit: Payer: Self-pay

## 2019-11-18 ENCOUNTER — Other Ambulatory Visit: Payer: Self-pay

## 2019-11-18 ENCOUNTER — Ambulatory Visit (INDEPENDENT_AMBULATORY_CARE_PROVIDER_SITE_OTHER): Payer: Medicare Other | Admitting: Family Medicine

## 2019-11-18 ENCOUNTER — Encounter: Payer: Self-pay | Admitting: Family Medicine

## 2019-11-18 VITALS — BP 110/62 | HR 93 | Temp 97.9°F | Ht 63.0 in | Wt 199.0 lb

## 2019-11-18 DIAGNOSIS — Z7901 Long term (current) use of anticoagulants: Secondary | ICD-10-CM

## 2019-11-18 DIAGNOSIS — I700 Atherosclerosis of aorta: Secondary | ICD-10-CM

## 2019-11-18 DIAGNOSIS — E1149 Type 2 diabetes mellitus with other diabetic neurological complication: Secondary | ICD-10-CM | POA: Diagnosis not present

## 2019-11-18 DIAGNOSIS — I10 Essential (primary) hypertension: Secondary | ICD-10-CM | POA: Diagnosis not present

## 2019-11-18 DIAGNOSIS — K5900 Constipation, unspecified: Secondary | ICD-10-CM

## 2019-11-18 DIAGNOSIS — M159 Polyosteoarthritis, unspecified: Secondary | ICD-10-CM

## 2019-11-18 DIAGNOSIS — R202 Paresthesia of skin: Secondary | ICD-10-CM

## 2019-11-18 DIAGNOSIS — M797 Fibromyalgia: Secondary | ICD-10-CM | POA: Diagnosis not present

## 2019-11-18 DIAGNOSIS — R200 Anesthesia of skin: Secondary | ICD-10-CM

## 2019-11-18 DIAGNOSIS — K219 Gastro-esophageal reflux disease without esophagitis: Secondary | ICD-10-CM

## 2019-11-18 HISTORY — DX: Type 2 diabetes mellitus with other diabetic neurological complication: E11.49

## 2019-11-18 NOTE — Assessment & Plan Note (Signed)
Chronic. ?  IBS. Adequate fiber and fluid intake recommended. MiraLAX daily as needed.

## 2019-11-18 NOTE — Assessment & Plan Note (Signed)
We discussed benefits and risk of anticoagulation. She will let me know if she has another episode of rectal bleeding.

## 2019-11-18 NOTE — Patient Instructions (Addendum)
A few things to remember from today's visit:   Type 2 diabetes mellitus with neurological complications ([place]) - Plan: Amb Referral to Nutrition and Diabetic E, Microalbumin / creatinine urine ratio, BASIC METABOLIC PANEL WITH GFR  Essential hypertension  Generalized osteoarthritis of multiple sites  Fibromyalgia  Adequate hydration and fiber. Miralax daily at night as needed.  Continue monitoring blood pressure at home. Fall precautions. Mention the tingling and numbness to your neurologist. Today we started Cymbalta 20 mg daily.   If you need refills please call your pharmacy. Do not use My Chart to request refills or for acute issues that need immediate attention.    Please be sure medication list is accurate. If a new problem present, please set up appointment sooner than planned today.

## 2019-11-18 NOTE — Assessment & Plan Note (Signed)
After discussion of benefits and some side effects, she agrees with trying Cymbalta, she will start 20 mg daily. Low impact exercise.

## 2019-11-18 NOTE — Assessment & Plan Note (Signed)
BP on lower normal range, this could be contributing to her episodes of dizziness. For now no changes in current management. Instructed to continue monitoring BP regularly and to bring BP monitor with her next visit. Continue low-salt diet.

## 2019-11-18 NOTE — Assessment & Plan Note (Addendum)
Cymbalta 20 mg started today. We discussed some side effects, planning on titrating dose up as tolerated. Fall precautions discussed.

## 2019-11-18 NOTE — Assessment & Plan Note (Addendum)
This problem could be a contributing factor for her chronic cough. She is not interested in trying a different PPI or adding medication. Continue GERD precautions.
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
## 2019-11-19 ENCOUNTER — Encounter: Payer: Medicare Other | Attending: Family Medicine | Admitting: Dietician

## 2019-11-19 DIAGNOSIS — E119 Type 2 diabetes mellitus without complications: Secondary | ICD-10-CM | POA: Diagnosis not present

## 2019-11-19 LAB — BASIC METABOLIC PANEL WITH GFR
BUN/Creatinine Ratio: 13 (calc) (ref 6–22)
BUN: 20 mg/dL (ref 7–25)
CO2: 28 mmol/L (ref 20–32)
Calcium: 9.5 mg/dL (ref 8.6–10.4)
Chloride: 105 mmol/L (ref 98–110)
Creat: 1.54 mg/dL — ABNORMAL HIGH (ref 0.60–0.93)
GFR, Est African American: 37 mL/min/{1.73_m2} — ABNORMAL LOW (ref >=60)
GFR, Est Non African American: 32 mL/min/{1.73_m2} — ABNORMAL LOW (ref >=60)
Glucose, Bld: 104 mg/dL — ABNORMAL HIGH (ref 65–99)
Potassium: 4.5 mmol/L (ref 3.5–5.3)
Sodium: 139 mmol/L (ref 135–146)

## 2019-11-19 LAB — MICROALBUMIN / CREATININE URINE RATIO
Creatinine, Urine: 48 mg/dL (ref 20–275)
Microalb, Ur: <0.2 mg/dL
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
## 2019-11-20 ENCOUNTER — Encounter: Payer: Self-pay | Admitting: Dietician

## 2019-11-20 NOTE — Patient Instructions (Signed)
Follow up with dietitian in two weeks.  Consider seeking a mental health provider.

## 2019-11-21 DIAGNOSIS — I700 Atherosclerosis of aorta: Secondary | ICD-10-CM | POA: Insufficient documentation

## 2019-12-11 ENCOUNTER — Ambulatory Visit: Payer: Medicare Other | Admitting: Dietician

## 2019-12-24 ENCOUNTER — Other Ambulatory Visit: Payer: Self-pay

## 2019-12-24 ENCOUNTER — Ambulatory Visit (INDEPENDENT_AMBULATORY_CARE_PROVIDER_SITE_OTHER): Payer: Medicare Other | Admitting: Neurology

## 2019-12-24 ENCOUNTER — Other Ambulatory Visit: Payer: Self-pay | Admitting: Family Medicine

## 2019-12-24 ENCOUNTER — Encounter: Payer: Self-pay | Admitting: Neurology

## 2019-12-24 VITALS — BP 118/70 | HR 68 | Ht 63.0 in | Wt 200.4 lb

## 2019-12-24 DIAGNOSIS — R569 Unspecified convulsions: Secondary | ICD-10-CM | POA: Diagnosis not present

## 2019-12-24 DIAGNOSIS — G3184 Mild cognitive impairment, so stated: Secondary | ICD-10-CM | POA: Diagnosis not present

## 2019-12-24 MED ORDER — LAMOTRIGINE ER 200 MG PO TB24: 200.0000 mg | ORAL_TABLET | Freq: Every day | ORAL | 4 refills | Status: DC

## 2019-12-24 NOTE — Patient Instructions (Signed)
I will switch to lamictal XR 200 mg tablet so you only have to take 1 tablet daily  Continue Namenda for your memory  Continue to see your primary doctor  See you back in 1 year
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
## 2020-01-01 ENCOUNTER — Encounter: Payer: Self-pay | Admitting: Dietician

## 2020-01-01 ENCOUNTER — Other Ambulatory Visit: Payer: Self-pay

## 2020-01-01 ENCOUNTER — Encounter: Payer: Medicare Other | Attending: Family Medicine | Admitting: Dietician

## 2020-01-01 DIAGNOSIS — R7303 Prediabetes: Secondary | ICD-10-CM | POA: Insufficient documentation

## 2020-01-01 DIAGNOSIS — E1149 Type 2 diabetes mellitus with other diabetic neurological complication: Secondary | ICD-10-CM | POA: Insufficient documentation

## 2020-01-01 NOTE — Progress Notes (Signed)
Medical Nutrition Therapy:  Appt start time: 1130 end time:  1200.   Assessment:  Primary concerns today: Diabetes Follow up.   Pt reports to appointment in a good mental state. Does not report any depression at the moment. Pt is interested in dietary advice. Pt reports buying a total gym and has been trying to use it more often. Pt reports loving peanut butter with ritz crackers, makes 6 ritz cracker sandwiches at a time. Pt states that she feels that she eats pretty well already. Pt does not care for lunch, has little interest in eating lunch. May have a salad with a boiled egg. Pt does not eat red meat, and has little interest in many starchy foods. Pt appears to have edema in lower legs.  Preferred Learning Style:   Visual  Learning Readiness:   Change in progress  MEDICATIONS: Lipitor, Lasix, Protonix, Prednisone, Aldactone   DIETARY INTAKE:  Usual eating pattern includes 2 meals and 1-2 snacks per day.   24 hr recall: B (9 am): Bowl of raisin bran, almond milk, 1 cup of yogurt of blueberry yogurt L: None D: (4 pm) Baked chicken, turnip greens, cornbread, beets Beverages: apple juice, tea, ~75 oz water, occasionally coffee  Usual physical activity: ADLs, Total Gym exercise machine  Progress Towards Goal(s):  In progress.   Nutritional Diagnosis:  NB-1.1 Food and nutrition-related knowledge deficit As related to type 2 diabetes.  As evidenced by A1c of 6.9, skipping meals throughout the day, and regular consumption of sugar sweetened beverages.    Intervention:  Nutrition Eduation.  Educated patient on the pathophysiology of diabetes. This includes why our bodies need circulating blood sugar, the relationship between insulin and blood sugar, and the results of insulin resistance and/or pancreatic insufficiency on the development of diabetes. Educated patient on factors that contribute to elevation of blood sugars, such as stress, illness, injury,and food choices. Discussed the  role that physical activity plays in lowering blood sugar. Educate patient on the three main macronutrients. Protein, fats, and carbohydrates. Discussed how each of these macronutrients affect blood sugar levels, especially carbohydrate, and the importance of eating a consistent amount of carbohydrate throughout the day.  Educated patient on the balanced plate eating model. Recommended lunch and dinner be 1/2 non-starchy vegetables, 1/4 starches, and 1/4 protein. Recommended breakfast be a balance of starch and protein with a piece of fruit. Discussed with patient the importance of working towards hitting the proportions of the balanced plate consistently. Educated patient on the nutritional value of each food group on the balanced plate model.   Goals:  Follow the balanced plate eating method.   1/2 plate non-starchy vegetables   1/4 plate protein  1/4 plate starch  Have Ritz crackers with peanut butter. Use only one cracker with peanut butter instead of making cracker sandwiches.  Work to have a starch with each meal.  Eat your lunch every day. If you have a salad, add an apple.  Space your meals apart by about 4-5 hours  Breakfast - 9 am  Lunch - 1-2 pm  Dinner - 5 pm  Try greek yogurt!  Teaching Method Utilized:  Visual Auditory   Handouts given during visit include:  Balanced Plate  Hand Gesture Portion Size guide  Barriers to learning/adherence to lifestyle change: None  Demonstrated degree of understanding via:  Teach Back   Monitoring/Evaluation:  Dietary intake, exercise, and body weight in 1 month(s).

## 2020-01-01 NOTE — Patient Instructions (Addendum)
Follow the balanced plate eating method.  1/2 plate non-starchy vegetables  1/4 plate protein 1/4 plate starch  Have Ritz crackers with peanut butter. Use only one cracker with peanut butter instead of making cracker sandwiches.  Work to have a starch with each meal.  Eat your lunch every day. If you have a salad, add an apple.  Space your meals apart by about 4-5 hours Breakfast - 9 am Lunch - 1-2 pm Dinner - 5 pm  Try greek yogurt!

## 2020-01-04 ENCOUNTER — Other Ambulatory Visit: Payer: Self-pay | Admitting: Family

## 2020-01-04 DIAGNOSIS — Z1231 Encounter for screening mammogram for malignant neoplasm of breast: Secondary | ICD-10-CM

## 2020-01-06 ENCOUNTER — Telehealth: Payer: Self-pay | Admitting: Family Medicine

## 2020-01-06 MED ORDER — ATORVASTATIN CALCIUM 40 MG PO TABS: 40.0000 mg | ORAL_TABLET | Freq: Every day | ORAL | 2 refills | Status: DC

## 2020-01-06 MED ORDER — IRBESARTAN 300 MG PO TABS
300.0000 mg | ORAL_TABLET | Freq: Every day | ORAL | 2 refills | Status: DC
Start: 2020-01-06 — End: 2020-01-25

## 2020-01-06 NOTE — Telephone Encounter (Signed)
Rx's sent in. °

## 2020-01-06 NOTE — Telephone Encounter (Signed)
atorvastatin (LIPITOR) 40 MG tablet  irbesartan (AVAPRO) 300 MG tablet  [facility] [id number] - [place], [place] [address] Phone:  [phone]  Fax:  [phone]      Fill for 3 months

## 2020-01-16 ENCOUNTER — Ambulatory Visit: Payer: Medicare Other | Attending: Internal Medicine

## 2020-01-16 ENCOUNTER — Other Ambulatory Visit: Payer: Self-pay

## 2020-01-16 DIAGNOSIS — Z23 Encounter for immunization: Secondary | ICD-10-CM

## 2020-01-16 NOTE — Progress Notes (Signed)
   Covid-19 Vaccination Clinic  Name:  [name]    MRN: [id number] DOB: 03/14/1941  01/16/2020  [name] was observed post Covid-19 immunization for 15 minutes without incident. She was provided with Vaccine Information Sheet and instruction to access the V-Safe system.   [name] was instructed to call 911 with any severe reactions post vaccine: Marland Kitchen Difficulty breathing  . Swelling of face and throat  . A fast heartbeat  . A bad rash all over body  . Dizziness and weakness

## 2020-01-17 ENCOUNTER — Ambulatory Visit (INDEPENDENT_AMBULATORY_CARE_PROVIDER_SITE_OTHER): Payer: Medicare Other

## 2020-01-17 ENCOUNTER — Other Ambulatory Visit: Payer: Self-pay

## 2020-01-17 ENCOUNTER — Ambulatory Visit (HOSPITAL_COMMUNITY)
Admission: EM | Admit: 2020-01-17 | Discharge: 2020-01-17 | Disposition: A | Discharge status: Home / Self Care | Payer: Medicare Other | Attending: Internal Medicine | Admitting: Internal Medicine

## 2020-01-17 ENCOUNTER — Encounter (HOSPITAL_COMMUNITY): Payer: Self-pay | Admitting: Emergency Medicine

## 2020-01-17 DIAGNOSIS — R42 Dizziness and giddiness: Secondary | ICD-10-CM | POA: Insufficient documentation

## 2020-01-17 DIAGNOSIS — R5383 Other fatigue: Secondary | ICD-10-CM

## 2020-01-17 DIAGNOSIS — R0602 Shortness of breath: Secondary | ICD-10-CM | POA: Diagnosis not present

## 2020-01-17 LAB — BASIC METABOLIC PANEL
Anion gap: 8 (ref 5–15)
BUN: 29 mg/dL — ABNORMAL HIGH (ref 8–23)
CO2: 24 mmol/L (ref 22–32)
Calcium: 9.3 mg/dL (ref 8.9–10.3)
Chloride: 103 mmol/L (ref 98–111)
Creatinine, Ser: 1.65 mg/dL — ABNORMAL HIGH (ref 0.44–1.00)
GFR, Estimated: 29 mL/min — ABNORMAL LOW (ref >60)
Glucose, Bld: 107 mg/dL — ABNORMAL HIGH (ref 70–99)
Potassium: 5.2 mmol/L — ABNORMAL HIGH (ref 3.5–5.1)
Sodium: 135 mmol/L (ref 135–145)

## 2020-01-17 LAB — CBC WITH DIFFERENTIAL/PLATELET
Abs Immature Granulocytes: 0.03 10*3/uL (ref 0.00–0.07)
Basophils Absolute: 0 10*3/uL (ref 0.0–0.1)
Basophils Relative: 0 %
Eosinophils Absolute: 0.1 10*3/uL (ref 0.0–0.5)
Eosinophils Relative: 2 %
HCT: 42.2 % (ref 36.0–46.0)
Hemoglobin: 13 g/dL (ref 12.0–15.0)
Immature Granulocytes: 0 %
Lymphocytes Relative: 28 %
Lymphs Abs: 2 10*3/uL (ref 0.7–4.0)
MCH: 28.6 pg (ref 26.0–34.0)
MCHC: 30.8 g/dL (ref 30.0–36.0)
MCV: 92.7 fL (ref 80.0–100.0)
Monocytes Absolute: 0.7 10*3/uL (ref 0.1–1.0)
Monocytes Relative: 9 %
Neutro Abs: 4.3 10*3/uL (ref 1.7–7.7)
Neutrophils Relative %: 61 %
Platelets: 218 10*3/uL (ref 150–400)
RBC: 4.55 MIL/uL (ref 3.87–5.11)
RDW: 13.6 % (ref 11.5–15.5)
WBC: 7.2 10*3/uL (ref 4.0–10.5)
nRBC: 0 % (ref 0.0–0.2)

## 2020-01-17 LAB — POCT URINALYSIS DIPSTICK, ED / UC
Bilirubin Urine: NEGATIVE
Glucose, UA: NEGATIVE mg/dL
Ketones, ur: NEGATIVE mg/dL
Leukocytes,Ua: NEGATIVE
Nitrite: NEGATIVE
Protein, ur: NEGATIVE mg/dL
Specific Gravity, Urine: <=1.005 (ref 1.005–1.030)
Urobilinogen, UA: 0.2 mg/dL (ref 0.0–1.0)
pH: 5 (ref 5.0–8.0)

## 2020-01-17 NOTE — ED Triage Notes (Addendum)
Started feeling bad 2 days ago. Patient initially had episodes of sweating, several episodes over the past 2 days.  Denies chest pain or pressure.  Patient is very tired.  Patient has slight dizziness.  Patient feels sob.  Patient is speaking in complete sentences.  Patient did get covid booster yesterday, says she told them she was not feeling good.    Edema present, but no more than usual per patient    Patient has been told she had a heart attack before, patient thinks this is how she felt
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
## 2020-01-17 NOTE — Discharge Instructions (Signed)
I will call you if there are any concerning findings with your blood tests.  Please follow up with your cardiologist and pcp as scheduled.  Please go to the ER if symptoms return or any worsening of symptoms.

## 2020-01-17 NOTE — ED Notes (Signed)
Spoke to Meyer,. Pa and natalie, np about patient.  Patient has decided to be evaluated here today.  Patient has an appt with dr Terrence Dupont tomorrow

## 2020-01-18 ENCOUNTER — Telehealth: Payer: Self-pay | Admitting: Family Medicine

## 2020-01-18 ENCOUNTER — Ambulatory Visit: Payer: Medicare Other | Admitting: Family Medicine

## 2020-01-18 DIAGNOSIS — E785 Hyperlipidemia, unspecified: Secondary | ICD-10-CM | POA: Diagnosis not present

## 2020-01-18 DIAGNOSIS — I2699 Other pulmonary embolism without acute cor pulmonale: Secondary | ICD-10-CM | POA: Diagnosis not present

## 2020-01-18 DIAGNOSIS — I10 Essential (primary) hypertension: Secondary | ICD-10-CM | POA: Diagnosis not present

## 2020-01-18 DIAGNOSIS — I2510 Atherosclerotic heart disease of native coronary artery without angina pectoris: Secondary | ICD-10-CM | POA: Diagnosis not present

## 2020-01-18 DIAGNOSIS — M1990 Unspecified osteoarthritis, unspecified site: Secondary | ICD-10-CM | POA: Diagnosis not present

## 2020-01-18 NOTE — Telephone Encounter (Signed)
VM NOT SETUP. Pt due to schedule Medicare Annual Wellness Visit (AWV) either virtually or in office.  Last AWV 12/16/17; please schedule at anytime with [facility] Nurse Health Advisor 2.  This should be a 45 minute visit.

## 2020-01-21 ENCOUNTER — Ambulatory Visit: Payer: Medicare Other

## 2020-01-25 ENCOUNTER — Ambulatory Visit (INDEPENDENT_AMBULATORY_CARE_PROVIDER_SITE_OTHER): Payer: Medicare Other | Admitting: Family Medicine

## 2020-01-25 ENCOUNTER — Other Ambulatory Visit: Payer: Self-pay

## 2020-01-25 ENCOUNTER — Encounter: Payer: Self-pay | Admitting: Family Medicine

## 2020-01-25 VITALS — BP 110/60 | HR 75 | Resp 16 | Ht 63.0 in | Wt 199.1 lb

## 2020-01-25 DIAGNOSIS — R059 Cough, unspecified: Secondary | ICD-10-CM | POA: Diagnosis not present

## 2020-01-25 DIAGNOSIS — E1149 Type 2 diabetes mellitus with other diabetic neurological complication: Secondary | ICD-10-CM

## 2020-01-25 DIAGNOSIS — M159 Polyosteoarthritis, unspecified: Secondary | ICD-10-CM

## 2020-01-25 DIAGNOSIS — K219 Gastro-esophageal reflux disease without esophagitis: Secondary | ICD-10-CM

## 2020-01-25 DIAGNOSIS — Z23 Encounter for immunization: Secondary | ICD-10-CM

## 2020-01-25 DIAGNOSIS — E875 Hyperkalemia: Secondary | ICD-10-CM

## 2020-01-25 DIAGNOSIS — M797 Fibromyalgia: Secondary | ICD-10-CM

## 2020-01-25 DIAGNOSIS — I10 Essential (primary) hypertension: Secondary | ICD-10-CM

## 2020-01-25 DIAGNOSIS — N1832 Chronic kidney disease, stage 3b: Secondary | ICD-10-CM

## 2020-01-25 MED ORDER — BENZONATATE 100 MG PO CAPS: 200.0000 mg | ORAL_CAPSULE | Freq: Two times a day (BID) | ORAL | 0 refills | Status: AC | PRN

## 2020-01-25 MED ORDER — OZEMPIC (0.25 OR 0.5 MG/DOSE) 2 MG/1.5ML ~~LOC~~ SOPN: 0.5000 mg | PEN_INJECTOR | SUBCUTANEOUS | 1 refills | Status: DC

## 2020-01-25 MED ORDER — DULOXETINE HCL 30 MG PO CPEP: 30.0000 mg | ORAL_CAPSULE | Freq: Every day | ORAL | 1 refills | Status: DC

## 2020-01-25 MED ORDER — IRBESARTAN 300 MG PO TABS: 150.0000 mg | ORAL_TABLET | Freq: Every day | ORAL | 2 refills | Status: DC

## 2020-01-25 MED ORDER — FAMOTIDINE 20 MG PO TABS: 20.0000 mg | ORAL_TABLET | Freq: Every day | ORAL | 2 refills | Status: DC

## 2020-01-25 NOTE — Assessment & Plan Note (Signed)
Wt has been stable. She would like to try pharmacologic treatment, Ozempic started today, side effects discussed.  Consistency with healthy diet and physical activity recommended.

## 2020-01-25 NOTE — Assessment & Plan Note (Signed)
Chronic. Possible etiologies discussed. Benzonatate 100 mg 1-2 bid as needed.

## 2020-01-25 NOTE — Assessment & Plan Note (Signed)
Duloxetine 30 mg daily started today. Low impact exercise and good sleep hygiene.

## 2020-01-25 NOTE — Assessment & Plan Note (Signed)
HGA1C has been at goal. She would like to try Ozempic, instructed to titrate dose from 0.25 mg weekly to 1 mg weekly as tolerated. Eye exam 02/2020. Continue appropriate foot care.

## 2020-01-25 NOTE — Assessment & Plan Note (Signed)
Problem could be a contributing factor for cough. Continue Protonix 40 mg, 30 min before breakfast. Pepcid 20 mg at bedtime. Continue GERD precautions.
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
## 2020-01-25 NOTE — Patient Instructions (Addendum)
A few things to remember from today's visit:   Fibromyalgia - Plan: DULoxetine (CYMBALTA) 30 MG capsule  Essential hypertension - Plan: irbesartan (AVAPRO) 300 MG tablet  Type 2 diabetes mellitus with neurological complications ([place]) - Plan: Semaglutide,0.25 or 0.5MG /DOS, (OZEMPIC, 0.25 OR 0.5 MG/DOSE,) 2 MG/1.5ML SOPN  Cough - Plan: benzonatate (TESSALON) 100 MG capsule  Today Ozempic was started, 0.25 mg weekly for 2 weeks then 0.5 mg weekly for 2 weeks and them 1 mg weekly. Pepcid 20 mg at bedtime. Protonix 40 mg to continue 30-45 min before breakfast.  Cymbalta 30 mg daily to help with muscle and joint pain.  If you need refills please call your pharmacy. Do not use My Chart to request refills or for acute issues that need immediate attention.    Please be sure medication list is accurate. If a new problem present, please set up appointment sooner than planned today.

## 2020-01-25 NOTE — Assessment & Plan Note (Signed)
She would like to try Duloxetine , start 30 mg daily. Some side effects discussed. Fall precautions.

## 2020-01-25 NOTE — Assessment & Plan Note (Signed)
Some low BP"s at home causing dizziness. Avapro decreased from 300 mg to 150 mg daily. Continue Carvedilol 25 mg bid and Spironolactone 25 mg daily. Continue low salt diet.

## 2020-01-26 LAB — POTASSIUM: Potassium: 5.5 mmol/L — ABNORMAL HIGH (ref 3.5–5.3)

## 2020-01-27 ENCOUNTER — Other Ambulatory Visit: Payer: Self-pay

## 2020-01-27 DIAGNOSIS — H524 Presbyopia: Secondary | ICD-10-CM | POA: Diagnosis not present

## 2020-01-27 DIAGNOSIS — E0500 Thyrotoxicosis with diffuse goiter without thyrotoxic crisis or storm: Secondary | ICD-10-CM | POA: Diagnosis not present

## 2020-01-27 DIAGNOSIS — H40023 Open angle with borderline findings, high risk, bilateral: Secondary | ICD-10-CM | POA: Diagnosis not present

## 2020-01-27 DIAGNOSIS — H04123 Dry eye syndrome of bilateral lacrimal glands: Secondary | ICD-10-CM | POA: Diagnosis not present

## 2020-01-27 DIAGNOSIS — E875 Hyperkalemia: Secondary | ICD-10-CM

## 2020-01-27 DIAGNOSIS — R7309 Other abnormal glucose: Secondary | ICD-10-CM | POA: Diagnosis not present

## 2020-01-27 LAB — HM DIABETES EYE EXAM

## 2020-02-02 ENCOUNTER — Other Ambulatory Visit: Payer: Medicare Other

## 2020-02-02 ENCOUNTER — Other Ambulatory Visit: Payer: Self-pay

## 2020-02-02 DIAGNOSIS — E875 Hyperkalemia: Secondary | ICD-10-CM

## 2020-02-03 LAB — POTASSIUM: Potassium: 5 mmol/L (ref 3.5–5.3)

## 2020-02-15 ENCOUNTER — Other Ambulatory Visit: Payer: Self-pay

## 2020-02-15 ENCOUNTER — Ambulatory Visit
Admission: RE | Admit: 2020-02-15 | Discharge: 2020-02-15 | Disposition: A | Discharge status: Home / Self Care | Payer: Medicare Other | Source: Ambulatory Visit | Attending: Family | Admitting: Family

## 2020-02-15 DIAGNOSIS — Z1231 Encounter for screening mammogram for malignant neoplasm of breast: Secondary | ICD-10-CM | POA: Diagnosis not present

## 2020-02-18 ENCOUNTER — Encounter: Payer: Self-pay | Admitting: Dietician

## 2020-02-18 ENCOUNTER — Encounter: Payer: Medicare Other | Attending: Family Medicine | Admitting: Dietician

## 2020-02-18 ENCOUNTER — Other Ambulatory Visit: Payer: Self-pay

## 2020-02-18 DIAGNOSIS — E1149 Type 2 diabetes mellitus with other diabetic neurological complication: Secondary | ICD-10-CM | POA: Insufficient documentation

## 2020-02-18 NOTE — Patient Instructions (Addendum)
Add in sweet potatoes to meals for a starch  Exercise on your [employer] twice a day for 15 minutes at a time!  Look for reduced sugar apple juice!  Have whole wheat crackers!

## 2020-02-18 NOTE — Progress Notes (Signed)
Medical Nutrition Therapy:  Appt start time: 1400 end time:  1430.   Assessment:  Primary concerns today: Diabetes Follow up.    Pt has been eating more consistently, and has been measuring her portions. Pt reports trying to eat red meat, but could eat salmon every day. Pt has started Ozempic since our last appointment, and has lost about 5 pounds. Pt eats greek yogurt when she craves sweets. Pt reports not having a taste for starches, and struggles to consistently eat them. Pt has been exercising 2-3 times a week. Using [employer] machine at home. Pt has a morning routine of waking up, reading her bible, then having breakfast. States she wants to add exercise to this routine. Pt reports eating until she is satisfied, and three meals a day consistently. Reports she is feeling less bloated, and feels generally better. Pt states eating frozen vegetables, never canned goods.  Preferred Learning Style:   Visual  Learning Readiness:   Change in progress  MEDICATIONS: Lipitor, Lasix, Protonix, Prednisone, Aldactone, NEW: Ozempic, Avapro   DIETARY INTAKE:  Usual eating pattern includes 3 meals and 1-2 snacks per day.   24 hr recall: B: Bowl of raisin bran cereal, small apple, Promise almond milk L: 1/2 cup black eyed peas, baked chicken, small apple, 2 bottles of water D:  Fish, black eyed peas, beets, apple juice ~8 oz, 2 bottles of water Beverages: Water, apple juice  Usual physical activity: ADLs, [employer] exercise machine  Progress Towards Goal(s):  In progress.   Nutritional Diagnosis:  NB-1.1 Food and nutrition-related knowledge deficit As related to type 2 diabetes.  As evidenced by A1c of 6.9, skipping meals throughout the day, and regular consumption of sugar sweetened beverages.    Intervention:  Nutrition Eduation.  Educated patient on the pathophysiology of diabetes. This includes why our bodies need circulating blood sugar, the relationship between insulin and blood  sugar, and the results of insulin resistance and/or pancreatic insufficiency on the development of diabetes. Educated patient on factors that contribute to elevation of blood sugars, such as stress, illness, injury,and food choices. Discussed the role that physical activity plays in lowering blood sugar. Educate patient on the three main macronutrients. Protein, fats, and carbohydrates. Discussed how each of these macronutrients affect blood sugar levels, especially carbohydrate, and the importance of eating a consistent amount of carbohydrate throughout the day.  Educated patient on the balanced plate eating model. Recommended lunch and dinner be 1/2 non-starchy vegetables, 1/4 starches, and 1/4 protein. Recommended breakfast be a balance of starch and protein with a piece of fruit. Discussed with patient the importance of working towards hitting the proportions of the balanced plate consistently. Educated patient on the nutritional value of each food group on the balanced plate model.   Goals:  Continue to follow the balanced plate eating method.   1/2 plate non-starchy vegetables   1/4 plate protein  1/4 plate starch  Work to have a starch with each meal.  Add in sweet potatoes to meals for a starch  Exercise on your [employer] twice a day for 15 minutes at a time!  Look for reduced sugar apple juice!  Have whole wheat crackers.    Teaching Method Utilized:  Visual Auditory   Handouts given during visit include:  Starches food list    Barriers to learning/adherence to lifestyle change: None  Demonstrated degree of understanding via:  Teach Back   Monitoring/Evaluation:  Dietary intake, exercise, and body weight in 3 month(s).

## 2020-03-14 ENCOUNTER — Other Ambulatory Visit: Payer: Self-pay

## 2020-03-14 ENCOUNTER — Ambulatory Visit (INDEPENDENT_AMBULATORY_CARE_PROVIDER_SITE_OTHER): Payer: Medicare Other | Admitting: Family Medicine

## 2020-03-14 ENCOUNTER — Encounter: Payer: Self-pay | Admitting: Family Medicine

## 2020-03-14 VITALS — BP 114/60 | HR 82 | Temp 97.8°F | Resp 16 | Ht 63.0 in | Wt 194.6 lb

## 2020-03-14 DIAGNOSIS — M79605 Pain in left leg: Secondary | ICD-10-CM | POA: Diagnosis not present

## 2020-03-14 DIAGNOSIS — M545 Low back pain, unspecified: Secondary | ICD-10-CM | POA: Diagnosis not present

## 2020-03-14 DIAGNOSIS — M79604 Pain in right leg: Secondary | ICD-10-CM | POA: Diagnosis not present

## 2020-03-14 MED ORDER — METHYLPREDNISOLONE ACETATE 80 MG/ML IJ SUSP
40.0000 mg | Freq: Once | INTRAMUSCULAR | Status: AC
  Administered 2020-03-14: 17:00:00 40 mg via INTRAMUSCULAR

## 2020-03-14 NOTE — Patient Instructions (Signed)
A few things to remember from today's visit:   Pain in both lower extremities  Acute left-sided low back pain, unspecified whether sciatica present  If you need refills please call your pharmacy. Do not use My Chart to request refills or for acute issues that need immediate attention.   It could be a pinch nerve. Today you received depo medrol 40 mg . If not better we will need back MRI. Monitor for worsening symptoms.  Please be sure medication list is accurate. If a new problem present, please set up appointment sooner than planned today.
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
## 2020-03-20 ENCOUNTER — Other Ambulatory Visit: Payer: Self-pay

## 2020-03-20 ENCOUNTER — Emergency Department
Admission: EM | Admit: 2020-03-20 | Discharge: 2020-03-20 | Disposition: A | Discharge status: Home / Self Care | Payer: Medicare Other | Attending: Emergency Medicine | Admitting: Emergency Medicine

## 2020-03-20 ENCOUNTER — Encounter: Payer: Self-pay | Admitting: Intensive Care

## 2020-03-20 DIAGNOSIS — J45909 Unspecified asthma, uncomplicated: Secondary | ICD-10-CM | POA: Insufficient documentation

## 2020-03-20 DIAGNOSIS — Z7901 Long term (current) use of anticoagulants: Secondary | ICD-10-CM | POA: Insufficient documentation

## 2020-03-20 DIAGNOSIS — Z79899 Other long term (current) drug therapy: Secondary | ICD-10-CM | POA: Insufficient documentation

## 2020-03-20 DIAGNOSIS — R55 Syncope and collapse: Secondary | ICD-10-CM | POA: Diagnosis not present

## 2020-03-20 DIAGNOSIS — E1149 Type 2 diabetes mellitus with other diabetic neurological complication: Secondary | ICD-10-CM | POA: Diagnosis not present

## 2020-03-20 DIAGNOSIS — E1122 Type 2 diabetes mellitus with diabetic chronic kidney disease: Secondary | ICD-10-CM | POA: Diagnosis not present

## 2020-03-20 DIAGNOSIS — N183 Chronic kidney disease, stage 3 unspecified: Secondary | ICD-10-CM | POA: Diagnosis not present

## 2020-03-20 DIAGNOSIS — R600 Localized edema: Secondary | ICD-10-CM | POA: Diagnosis not present

## 2020-03-20 DIAGNOSIS — Z96651 Presence of right artificial knee joint: Secondary | ICD-10-CM | POA: Insufficient documentation

## 2020-03-20 DIAGNOSIS — R52 Pain, unspecified: Secondary | ICD-10-CM | POA: Diagnosis not present

## 2020-03-20 DIAGNOSIS — I129 Hypertensive chronic kidney disease with stage 1 through stage 4 chronic kidney disease, or unspecified chronic kidney disease: Secondary | ICD-10-CM | POA: Diagnosis not present

## 2020-03-20 DIAGNOSIS — R0902 Hypoxemia: Secondary | ICD-10-CM | POA: Diagnosis not present

## 2020-03-20 DIAGNOSIS — I440 Atrioventricular block, first degree: Secondary | ICD-10-CM | POA: Diagnosis not present

## 2020-03-20 DIAGNOSIS — R404 Transient alteration of awareness: Secondary | ICD-10-CM | POA: Diagnosis not present

## 2020-03-20 DIAGNOSIS — Z743 Need for continuous supervision: Secondary | ICD-10-CM | POA: Diagnosis not present

## 2020-03-20 DIAGNOSIS — Z7722 Contact with and (suspected) exposure to environmental tobacco smoke (acute) (chronic): Secondary | ICD-10-CM | POA: Insufficient documentation

## 2020-03-20 DIAGNOSIS — I2510 Atherosclerotic heart disease of native coronary artery without angina pectoris: Secondary | ICD-10-CM | POA: Insufficient documentation

## 2020-03-20 DIAGNOSIS — R6889 Other general symptoms and signs: Secondary | ICD-10-CM | POA: Diagnosis not present

## 2020-03-20 DIAGNOSIS — W19XXXA Unspecified fall, initial encounter: Secondary | ICD-10-CM | POA: Insufficient documentation

## 2020-03-20 LAB — URINALYSIS, COMPLETE (UACMP) WITH MICROSCOPIC
Bilirubin Urine: NEGATIVE
Glucose, UA: NEGATIVE mg/dL
Hgb urine dipstick: NEGATIVE
Ketones, ur: NEGATIVE mg/dL
Leukocytes,Ua: NEGATIVE
Nitrite: NEGATIVE
Protein, ur: NEGATIVE mg/dL
Specific Gravity, Urine: 1.01 (ref 1.005–1.030)
pH: 5 (ref 5.0–8.0)

## 2020-03-20 LAB — BASIC METABOLIC PANEL
Anion gap: 8 (ref 5–15)
BUN: 26 mg/dL — ABNORMAL HIGH (ref 8–23)
CO2: 26 mmol/L (ref 22–32)
Calcium: 8.9 mg/dL (ref 8.9–10.3)
Chloride: 100 mmol/L (ref 98–111)
Creatinine, Ser: 1.67 mg/dL — ABNORMAL HIGH (ref 0.44–1.00)
GFR, Estimated: 31 mL/min — ABNORMAL LOW (ref >60)
Glucose, Bld: 117 mg/dL — ABNORMAL HIGH (ref 70–99)
Potassium: 4.5 mmol/L (ref 3.5–5.1)
Sodium: 134 mmol/L — ABNORMAL LOW (ref 135–145)

## 2020-03-20 LAB — CBC
HCT: 40.4 % (ref 36.0–46.0)
Hemoglobin: 12.9 g/dL (ref 12.0–15.0)
MCH: 29.7 pg (ref 26.0–34.0)
MCHC: 31.9 g/dL (ref 30.0–36.0)
MCV: 92.9 fL (ref 80.0–100.0)
Platelets: 237 10*3/uL (ref 150–400)
RBC: 4.35 MIL/uL (ref 3.87–5.11)
RDW: 13.5 % (ref 11.5–15.5)
WBC: 7.7 10*3/uL (ref 4.0–10.5)
nRBC: 0 % (ref 0.0–0.2)

## 2020-03-20 LAB — TROPONIN I (HIGH SENSITIVITY): Troponin I (High Sensitivity): 3 ng/L (ref <18)

## 2020-03-20 NOTE — Discharge Instructions (Addendum)
Please seek medical attention for any high fevers, chest pain, shortness of breath, change in behavior, persistent vomiting, bloody stool or any other new or concerning symptoms.  
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
## 2020-03-20 NOTE — ED Triage Notes (Signed)
Arrived by EMS for LOC. Reports she was walking to kitchen and fell to the ground. Denies hitting head. Reports hurting left foot and legs. Reports being able to stand and ambulate after incident. Patient A&O x4 in triage

## 2020-03-20 NOTE — ED Triage Notes (Signed)
Pt in via EMS from home with c/o near syncope while walking inside. Pt got dizzy and hot, had tunnel vision and her legs gave out. Pt c/o left foot pain, no obvious injuries. Pt was diaphoretic and clammy on their arrival. 108/56, HR 70, 98%RA

## 2020-03-22 ENCOUNTER — Other Ambulatory Visit: Payer: Self-pay | Admitting: Family Medicine

## 2020-03-22 DIAGNOSIS — M797 Fibromyalgia: Secondary | ICD-10-CM

## 2020-03-22 DIAGNOSIS — M159 Polyosteoarthritis, unspecified: Secondary | ICD-10-CM

## 2020-03-23 ENCOUNTER — Ambulatory Visit (INDEPENDENT_AMBULATORY_CARE_PROVIDER_SITE_OTHER): Payer: Medicare Other | Admitting: Family Medicine

## 2020-03-23 ENCOUNTER — Other Ambulatory Visit: Payer: Self-pay

## 2020-03-23 ENCOUNTER — Ambulatory Visit (INDEPENDENT_AMBULATORY_CARE_PROVIDER_SITE_OTHER): Payer: Medicare Other

## 2020-03-23 ENCOUNTER — Encounter: Payer: Self-pay | Admitting: Family Medicine

## 2020-03-23 VITALS — BP 118/70 | HR 75 | Resp 16 | Ht 63.0 in

## 2020-03-23 DIAGNOSIS — S99929D Unspecified injury of unspecified foot, subsequent encounter: Secondary | ICD-10-CM

## 2020-03-23 DIAGNOSIS — M19072 Primary osteoarthritis, left ankle and foot: Secondary | ICD-10-CM | POA: Diagnosis not present

## 2020-03-23 DIAGNOSIS — R55 Syncope and collapse: Secondary | ICD-10-CM

## 2020-03-23 DIAGNOSIS — I10 Essential (primary) hypertension: Secondary | ICD-10-CM | POA: Diagnosis not present

## 2020-03-23 DIAGNOSIS — M797 Fibromyalgia: Secondary | ICD-10-CM | POA: Diagnosis not present

## 2020-03-23 DIAGNOSIS — N1832 Chronic kidney disease, stage 3b: Secondary | ICD-10-CM | POA: Diagnosis not present

## 2020-03-23 DIAGNOSIS — W19XXXD Unspecified fall, subsequent encounter: Secondary | ICD-10-CM

## 2020-03-23 DIAGNOSIS — M7989 Other specified soft tissue disorders: Secondary | ICD-10-CM | POA: Diagnosis not present

## 2020-03-23 DIAGNOSIS — R42 Dizziness and giddiness: Secondary | ICD-10-CM

## 2020-03-23 DIAGNOSIS — M19071 Primary osteoarthritis, right ankle and foot: Secondary | ICD-10-CM | POA: Diagnosis not present
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
## 2020-03-23 NOTE — Assessment & Plan Note (Signed)
Mildly worse sine 01/2020. Continue Avapro 150 mg daily. Adequate hydration, low-salt diet. Adequate BP and glucose control. Spironolactone stopped today. We will plan on checking BMP next visit.

## 2020-03-23 NOTE — Patient Instructions (Signed)
A few things to remember from today's visit:   Fall, subsequent encounter - Plan: Ambulatory referral to [place], DG Ankle Complete Right  Injury of foot, unspecified laterality, subsequent encounter - Plan: DG Foot Complete Right, DG Foot Complete Left, DG Ankle Complete Right  Essential hypertension  Fibromyalgia  Stage 3b chronic kidney disease ([place])  Dizziness  If you need refills please call your pharmacy. Do not use My Chart to request refills or for acute issues that need immediate attention.   Fall precautions. PT will be arranged. Stop Spironolactone. Check blood pressure at home.  Please be sure medication list is accurate. If a new problem present, please set up appointment sooner than planned today.

## 2020-03-23 NOTE — Assessment & Plan Note (Signed)
Musculoskeletal pain worse after the fall. Explained that she may have more pain than usual during few days. ROM exercises and activity as tolerated. PT will be arranged.

## 2020-03-23 NOTE — Assessment & Plan Note (Signed)
Because episodes of hypotension, spironolactone discontinued today. Continue Avapro 300 mg 1/2 tablet daily and carvedilol 25 mg twice daily. Recommend monitoring BP regularly. Continue low-salt diet. Follow-up in 4 weeks, before if needed.

## 2020-03-29 DIAGNOSIS — M25571 Pain in right ankle and joints of right foot: Secondary | ICD-10-CM | POA: Diagnosis not present

## 2020-03-29 DIAGNOSIS — M25572 Pain in left ankle and joints of left foot: Secondary | ICD-10-CM | POA: Diagnosis not present

## 2020-04-01 ENCOUNTER — Other Ambulatory Visit: Payer: Self-pay | Admitting: Family Medicine

## 2020-04-07 ENCOUNTER — Other Ambulatory Visit: Payer: Self-pay

## 2020-04-11 ENCOUNTER — Ambulatory Visit (HOSPITAL_COMMUNITY)
Admission: RE | Admit: 2020-04-11 | Discharge: 2020-04-11 | Disposition: A | Discharge status: Home / Self Care | Payer: Medicare Other | Source: Ambulatory Visit | Attending: Cardiology | Admitting: Cardiology

## 2020-04-11 ENCOUNTER — Other Ambulatory Visit: Payer: Self-pay

## 2020-04-11 DIAGNOSIS — R55 Syncope and collapse: Secondary | ICD-10-CM | POA: Diagnosis not present

## 2020-04-11 DIAGNOSIS — R42 Dizziness and giddiness: Secondary | ICD-10-CM | POA: Diagnosis not present

## 2020-04-16 ENCOUNTER — Other Ambulatory Visit: Payer: Self-pay | Admitting: Family Medicine

## 2020-04-20 ENCOUNTER — Ambulatory Visit: Payer: Medicare Other | Admitting: Family Medicine

## 2020-04-27 DIAGNOSIS — I2510 Atherosclerotic heart disease of native coronary artery without angina pectoris: Secondary | ICD-10-CM | POA: Diagnosis not present

## 2020-04-27 DIAGNOSIS — E785 Hyperlipidemia, unspecified: Secondary | ICD-10-CM | POA: Diagnosis not present

## 2020-04-27 DIAGNOSIS — I10 Essential (primary) hypertension: Secondary | ICD-10-CM | POA: Diagnosis not present

## 2020-04-27 DIAGNOSIS — R0789 Other chest pain: Secondary | ICD-10-CM | POA: Diagnosis not present

## 2020-04-27 DIAGNOSIS — I2699 Other pulmonary embolism without acute cor pulmonale: Secondary | ICD-10-CM | POA: Diagnosis not present

## 2020-04-28 ENCOUNTER — Encounter: Payer: Medicare Other | Attending: Family Medicine | Admitting: Dietician

## 2020-04-28 DIAGNOSIS — I10 Essential (primary) hypertension: Secondary | ICD-10-CM | POA: Diagnosis not present

## 2020-04-28 DIAGNOSIS — E1149 Type 2 diabetes mellitus with other diabetic neurological complication: Secondary | ICD-10-CM | POA: Insufficient documentation

## 2020-04-28 DIAGNOSIS — E785 Hyperlipidemia, unspecified: Secondary | ICD-10-CM | POA: Diagnosis not present

## 2020-05-17 ENCOUNTER — Other Ambulatory Visit: Payer: Self-pay

## 2020-05-18 ENCOUNTER — Ambulatory Visit (INDEPENDENT_AMBULATORY_CARE_PROVIDER_SITE_OTHER): Payer: Medicare Other | Admitting: Family Medicine

## 2020-05-18 ENCOUNTER — Encounter: Payer: Self-pay | Admitting: Family Medicine

## 2020-05-18 VITALS — BP 90/60 | HR 93 | Resp 16 | Ht 63.0 in | Wt 193.0 lb

## 2020-05-18 DIAGNOSIS — M797 Fibromyalgia: Secondary | ICD-10-CM | POA: Diagnosis not present

## 2020-05-18 DIAGNOSIS — N1832 Chronic kidney disease, stage 3b: Secondary | ICD-10-CM | POA: Diagnosis not present

## 2020-05-18 DIAGNOSIS — E1149 Type 2 diabetes mellitus with other diabetic neurological complication: Secondary | ICD-10-CM

## 2020-05-18 DIAGNOSIS — I10 Essential (primary) hypertension: Secondary | ICD-10-CM

## 2020-05-18 DIAGNOSIS — R42 Dizziness and giddiness: Secondary | ICD-10-CM

## 2020-05-18 DIAGNOSIS — M159 Polyosteoarthritis, unspecified: Secondary | ICD-10-CM

## 2020-05-18 LAB — COMPREHENSIVE METABOLIC PANEL
ALT: 10 U/L (ref 0–35)
AST: 10 U/L (ref 0–37)
Albumin: 3.7 g/dL (ref 3.5–5.2)
Alkaline Phosphatase: 87 U/L (ref 39–117)
BUN: 27 mg/dL — ABNORMAL HIGH (ref 6–23)
CO2: 29 mEq/L (ref 19–32)
Calcium: 9.4 mg/dL (ref 8.4–10.5)
Chloride: 99 mEq/L (ref 96–112)
Creatinine, Ser: 1.72 mg/dL — ABNORMAL HIGH (ref 0.40–1.20)
GFR: 28.11 mL/min — ABNORMAL LOW (ref >60.00)
Glucose, Bld: 90 mg/dL (ref 70–99)
Potassium: 4.4 mEq/L (ref 3.5–5.1)
Sodium: 137 mEq/L (ref 135–145)
Total Bilirubin: 0.4 mg/dL (ref 0.2–1.2)
Total Protein: 6.9 g/dL (ref 6.0–8.3)

## 2020-05-18 LAB — HEMOGLOBIN A1C: Hgb A1c MFr Bld: 5.8 % (ref 4.6–6.5)

## 2020-05-18 LAB — GLUCOSE, POCT (MANUAL RESULT ENTRY): POC Glucose: 108 mg/dl — AB (ref 70–99)

## 2020-05-18 MED ORDER — DULOXETINE HCL 30 MG PO CPEP: ORAL_CAPSULE | ORAL | 1 refills | Status: DC

## 2020-05-18 MED ORDER — IRBESARTAN 150 MG PO TABS: 150.0000 mg | ORAL_TABLET | Freq: Every day | ORAL | 1 refills | Status: DC

## 2020-05-18 NOTE — Patient Instructions (Addendum)
A few things to remember from today's visit:   Dizziness  Fibromyalgia  Stage 3b chronic kidney disease ([place])  Type 2 diabetes mellitus with neurological complications ([facility])  Generalized osteoarthritis of multiple sites  Essential hypertension  If you need refills please call your pharmacy. Do not use My Chart to request refills or for acute issues that need immediate attention.   Blood pressure today 90/60 x 3. Today Avapro decreased from 300 mg to 150 mg. Monitor blood pressure at home. No changes in rest.  Please be sure medication list is accurate. If a new problem present, please set up appointment sooner than planned today.
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
## 2020-05-22 LAB — FRUCTOSAMINE: Fructosamine: 225 umol/L (ref 205–285)

## 2020-05-23 ENCOUNTER — Other Ambulatory Visit: Payer: Self-pay

## 2020-05-23 DIAGNOSIS — N183 Chronic kidney disease, stage 3 unspecified: Secondary | ICD-10-CM

## 2020-05-24 ENCOUNTER — Telehealth: Payer: Self-pay | Admitting: Family Medicine

## 2020-05-24 NOTE — Telephone Encounter (Signed)
Pt has been on Valtrex in the past for outbreaks.

## 2020-05-24 NOTE — Telephone Encounter (Signed)
Pt is calling in stating that she would like to have something for her herpes.  Pharm:  [facility] on [address]

## 2020-05-25 ENCOUNTER — Encounter: Payer: Self-pay | Admitting: Dietician

## 2020-05-25 ENCOUNTER — Other Ambulatory Visit: Payer: Self-pay | Admitting: Family Medicine

## 2020-05-25 ENCOUNTER — Other Ambulatory Visit: Payer: Self-pay

## 2020-05-25 ENCOUNTER — Encounter: Payer: Medicare Other | Attending: Family Medicine | Admitting: Dietician

## 2020-05-25 VITALS — Ht 63.0 in | Wt 187.0 lb

## 2020-05-25 DIAGNOSIS — E1122 Type 2 diabetes mellitus with diabetic chronic kidney disease: Secondary | ICD-10-CM | POA: Insufficient documentation

## 2020-05-25 DIAGNOSIS — A6000 Herpesviral infection of urogenital system, unspecified: Secondary | ICD-10-CM

## 2020-05-25 MED ORDER — VALACYCLOVIR HCL 500 MG PO TABS
500.0000 mg | ORAL_TABLET | Freq: Two times a day (BID) | ORAL | 0 refills | Status: AC
Start: 2020-05-25 — End: 2020-05-28

## 2020-05-25 NOTE — Telephone Encounter (Signed)
Rx for Valtrex 500 mg to take bid x 3 days sent to her pharmacy. We have not addressed this problem, so we can do so next visit. If she has another outbreak,I will need to see her. Thanks, BJ

## 2020-05-25 NOTE — Progress Notes (Signed)
Medical Nutrition Therapy:  Appt start time: 1130 end time:  1200.   Assessment:  Primary concerns today: Diabetes Follow up.    Pt A1c is now down to 5.8 (05/18/2020) Pt labs indicate S4 CKD (05/18/2020) Renal function Labs - GFR - 28.11, BUN - 27 (High), Creatinine 1.72 (High) Pt reports doctor informed them that their kidneys are beginning to lose function. Pt reports some RLQ and rear flank pain. Pt states they believe it is related to their kidneys. Pt reports only being able to drink either water, tea, or apple juice due to their kidney pain. Pt reports spells of dizziness in the past few weeks. Pt reports checking their BP daily, reports numbers around 117/66 being their lowest. Last doctor visit, BP was recorded at 90/60. Pt reports doctor lowered one of their BP medications as a result. Pt reports still measuring their food and exercising. Pt reports drinking 5 bottles of water. Pt reports concerns of polypharmacy and losing too much weight. Pt reports having a neighbor die recently of kidney failure. Pt states that neighbor lost a lot of weight very quickly, and that scared them.  Preferred Learning Style:   Visual  Learning Readiness:   Change in progress  MEDICATIONS: Lipitor, Lasix, Protonix, Prednisone, Aldactone, Avapro Discontinued: Ozempic   DIETARY INTAKE:  Usual eating pattern includes 3 meals per day.   24 hr recall: B: Quiche, 1 egg, and toast L: Baked fish, ritz crackers, yogurts, collard greens (Late lunch) D: none Beverages: Water  Usual physical activity: ADLs, [employer] exercise machine  Progress Towards Goal(s):  In progress.   Nutritional Diagnosis:  NB-1.1 Food and nutrition-related knowledge deficit As related to type 2 diabetes.  As evidenced by A1c of 6.9, skipping meals throughout the day, and regular consumption of sugar sweetened beverages.    Intervention:  Nutrition Eduation.  Educated patient on the pathophysiology of diabetes. This  includes why our bodies need circulating blood sugar, the relationship between insulin and blood sugar, and the results of insulin resistance and/or pancreatic insufficiency on the development of diabetes. Educated patient on factors that contribute to elevation of blood sugars, such as stress, illness, injury,and food choices. Discussed the role that physical activity plays in lowering blood sugar. Educate patient on the three main macronutrients. Protein, fats, and carbohydrates. Discussed how each of these macronutrients affect blood sugar levels, especially carbohydrate, and the importance of eating a consistent amount of carbohydrate throughout the day.  Educated patient on the balanced plate eating model. Recommended lunch and dinner be 1/2 non-starchy vegetables, 1/4 starches, and 1/4 protein. Recommended breakfast be a balance of starch and protein with a piece of fruit. Discussed with patient the importance of working towards hitting the proportions of the balanced plate consistently. Educated patient on the nutritional value of each food group on the balanced plate model.  NEW (05/25/2020): Educated patient on the need to moderate protein intake to 0.6 g/kg per day with declining kidney function. Advise patient to consult kidney specialist and follow up with any questions regarding nutrition recommendations given by specialist. Recommended protein intake of ~50g/day (0.6g/kg).  Goals:  Try to eat around 50g of protein each day.  1 cup of milk = 8 g  1 egg = 7 g  3 oz meat = 20 g (size of a deck of cards or the palm of your hand)  1 cup of greek yogurt = 12 g  Have 4 oz of apple juice when you drink it.  Teaching Method Utilized:  Visual Auditory   Handouts given during visit include:  Starches food list   Barriers to learning/adherence to lifestyle change: None  Demonstrated degree of understanding via:  Teach Back   Monitoring/Evaluation:  Dietary intake, exercise, and  body weight in 6 month(s).

## 2020-05-25 NOTE — Patient Instructions (Addendum)
   Try to eat around 50g of protein each day.  1 cup of milk = 8 g  1 egg = 7 g  3 oz meat = 20 g (size of a deck of cards or the palm of your hand)  1 cup of greek yogurt = 12 g   Have 4 oz of apple juice when you drink it.

## 2020-05-29 ENCOUNTER — Other Ambulatory Visit: Payer: Self-pay | Admitting: Family Medicine

## 2020-05-31 DIAGNOSIS — N183 Chronic kidney disease, stage 3 unspecified: Secondary | ICD-10-CM | POA: Diagnosis not present

## 2020-05-31 DIAGNOSIS — E1122 Type 2 diabetes mellitus with diabetic chronic kidney disease: Secondary | ICD-10-CM | POA: Diagnosis not present

## 2020-05-31 DIAGNOSIS — I129 Hypertensive chronic kidney disease with stage 1 through stage 4 chronic kidney disease, or unspecified chronic kidney disease: Secondary | ICD-10-CM | POA: Diagnosis not present

## 2020-06-02 ENCOUNTER — Ambulatory Visit (HOSPITAL_COMMUNITY)
Admission: EM | Admit: 2020-06-02 | Discharge: 2020-06-02 | Disposition: A | Discharge status: Home / Self Care | Payer: Medicare Other | Attending: Emergency Medicine | Admitting: Emergency Medicine

## 2020-06-02 ENCOUNTER — Other Ambulatory Visit: Payer: Self-pay

## 2020-06-02 ENCOUNTER — Encounter (HOSPITAL_COMMUNITY): Payer: Self-pay | Admitting: Emergency Medicine

## 2020-06-02 DIAGNOSIS — M5442 Lumbago with sciatica, left side: Secondary | ICD-10-CM | POA: Diagnosis not present

## 2020-06-02 MED ORDER — DICLOFENAC SODIUM 1 % EX GEL
2.0000 g | Freq: Four times a day (QID) | CUTANEOUS | 0 refills | Status: DC
Start: 2020-06-02 — End: 2021-07-14

## 2020-06-02 MED ORDER — DIPHENHYDRAMINE HCL 12.5 MG/5ML PO SYRP: 12.5000 mg | ORAL_SOLUTION | Freq: Every day | ORAL | 0 refills | Status: DC

## 2020-06-02 MED ORDER — ACETAMINOPHEN 160 MG/5ML PO SUSP: 500.0000 mg | Freq: Four times a day (QID) | ORAL | 1 refills | Status: DC | PRN

## 2020-06-02 NOTE — ED Triage Notes (Signed)
Pt presents with left side pain that starts in hip and radiates into leg. States has been going on for a while but has not been seen.
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
## 2020-06-02 NOTE — Discharge Instructions (Addendum)
Can take 15 mL tylenol syrup every 6 hours as needed for pain  Can take 5 mL  of benadryl at bedtime to help with sleep  Can use gel on areas of pain four times a day as needed for additional comfort  Continue exercise and stretching to help loosen muscles

## 2020-06-15 ENCOUNTER — Ambulatory Visit (INDEPENDENT_AMBULATORY_CARE_PROVIDER_SITE_OTHER): Payer: Medicare Other | Admitting: Family Medicine

## 2020-06-15 ENCOUNTER — Other Ambulatory Visit: Payer: Self-pay

## 2020-06-15 ENCOUNTER — Ambulatory Visit (HOSPITAL_COMMUNITY)
Admission: EM | Admit: 2020-06-15 | Discharge: 2020-06-15 | Disposition: A | Discharge status: Home / Self Care | Payer: Medicare Other

## 2020-06-15 ENCOUNTER — Encounter: Payer: Self-pay | Admitting: Family Medicine

## 2020-06-15 ENCOUNTER — Encounter (HOSPITAL_COMMUNITY): Payer: Self-pay | Admitting: Emergency Medicine

## 2020-06-15 VITALS — BP 110/60 | HR 82 | Temp 97.9°F | Ht 63.0 in | Wt 191.5 lb

## 2020-06-15 DIAGNOSIS — R109 Unspecified abdominal pain: Secondary | ICD-10-CM | POA: Diagnosis not present

## 2020-06-15 DIAGNOSIS — R1031 Right lower quadrant pain: Secondary | ICD-10-CM | POA: Diagnosis not present
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
## 2020-06-15 NOTE — Patient Instructions (Signed)
Abdominal Pain, Adult Pain in the abdomen (abdominal pain) can be caused by many things. Often, abdominal pain is not serious and it gets better with no treatment or by being treated at home. However, sometimes abdominal pain is serious. Your health care provider will ask questions about your medical history and do a physical exam to try to determine the cause of your abdominal pain. Follow these instructions at home: Medicines  Take over-the-counter and prescription medicines only as told by your health care provider.  Do not take a laxative unless told by your health care provider. General instructions  Watch your condition for any changes.  Drink enough fluid to keep your urine pale yellow.  Keep all follow-up visits as told by your health care provider. This is important.   Contact a health care provider if:  Your abdominal pain changes or gets worse.  You are not hungry or you lose weight without trying.  You are constipated or have diarrhea for more than 2-3 days.  You have pain when you urinate or have a bowel movement.  Your abdominal pain wakes you up at night.  Your pain gets worse with meals, after eating, or with certain foods.  You are vomiting and cannot keep anything down.  You have a fever.  You have blood in your urine. Get help right away if:  Your pain does not go away as soon as your health care provider told you to expect.  You cannot stop vomiting.  Your pain is only in areas of the abdomen, such as the right side or the left lower portion of the abdomen. Pain on the right side could be caused by appendicitis.  You have bloody or black stools, or stools that look like tar.  You have severe pain, cramping, or bloating in your abdomen.  You have signs of dehydration, such as: ? Dark urine, very little urine, or no urine. ? Cracked lips. ? Dry mouth. ? Sunken eyes. ? Sleepiness. ? Weakness.  You have trouble breathing or chest  pain. Summary  Often, abdominal pain is not serious and it gets better with no treatment or by being treated at home. However, sometimes abdominal pain is serious.  Watch your condition for any changes.  Take over-the-counter and prescription medicines only as told by your health care provider.  Contact a health care provider if your abdominal pain changes or gets worse.  Get help right away if you have severe pain, cramping, or bloating in your abdomen. This information is not intended to replace advice given to you by your health care provider. Make sure you discuss any questions you have with your health care provider. Document Revised: 05/08/2019 Document Reviewed: 07/28/2018 Elsevier Patient Education  2021 [place].  Will be setting up CT scan of abdomen and pelvis to further evaluate  Consider short term use of Senokot S for constipation.

## 2020-06-15 NOTE — ED Triage Notes (Signed)
Pt presents today with c/o of RLQ pain x 1 month, denies n/v/d, no problem with urination, does report constipation, lbm 06/12/20.
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
## 2020-06-16 LAB — COMPREHENSIVE METABOLIC PANEL
ALT: 10 U/L (ref 0–35)
AST: 12 U/L (ref 0–37)
Albumin: 3.7 g/dL (ref 3.5–5.2)
Alkaline Phosphatase: 83 U/L (ref 39–117)
BUN: 16 mg/dL (ref 6–23)
CO2: 28 mEq/L (ref 19–32)
Calcium: 9.6 mg/dL (ref 8.4–10.5)
Chloride: 103 mEq/L (ref 96–112)
Creatinine, Ser: 1.46 mg/dL — ABNORMAL HIGH (ref 0.40–1.20)
GFR: 34.2 mL/min — ABNORMAL LOW (ref >60.00)
Glucose, Bld: 94 mg/dL (ref 70–99)
Potassium: 4.9 mEq/L (ref 3.5–5.1)
Sodium: 139 mEq/L (ref 135–145)
Total Bilirubin: 0.4 mg/dL (ref 0.2–1.2)
Total Protein: 6.9 g/dL (ref 6.0–8.3)

## 2020-06-16 LAB — CBC WITH DIFFERENTIAL/PLATELET
Basophils Absolute: 0.1 10*3/uL (ref 0.0–0.1)
Basophils Relative: 1.1 % (ref 0.0–3.0)
Eosinophils Absolute: 0.1 10*3/uL (ref 0.0–0.7)
Eosinophils Relative: 1.9 % (ref 0.0–5.0)
HCT: 39.6 % (ref 36.0–46.0)
Hemoglobin: 12.8 g/dL (ref 12.0–15.0)
Lymphocytes Relative: 40.9 % (ref 12.0–46.0)
Lymphs Abs: 2.7 10*3/uL (ref 0.7–4.0)
MCHC: 32.3 g/dL (ref 30.0–36.0)
MCV: 92.4 fl (ref 78.0–100.0)
Monocytes Absolute: 0.6 10*3/uL (ref 0.1–1.0)
Monocytes Relative: 9.3 % (ref 3.0–12.0)
Neutro Abs: 3.1 10*3/uL (ref 1.4–7.7)
Neutrophils Relative %: 46.8 % (ref 43.0–77.0)
Platelets: 223 10*3/uL (ref 150.0–400.0)
RBC: 4.28 Mil/uL (ref 3.87–5.11)
RDW: 14.7 % (ref 11.5–15.5)
WBC: 6.6 10*3/uL (ref 4.0–10.5)

## 2020-06-16 LAB — URINALYSIS
Bilirubin Urine: NEGATIVE
Hgb urine dipstick: NEGATIVE
Ketones, ur: NEGATIVE
Leukocytes,Ua: NEGATIVE
Nitrite: NEGATIVE
Specific Gravity, Urine: 1.01 (ref 1.000–1.030)
Total Protein, Urine: NEGATIVE
Urine Glucose: NEGATIVE
Urobilinogen, UA: 0.2 (ref 0.0–1.0)
pH: 6 (ref 5.0–8.0)

## 2020-06-18 ENCOUNTER — Other Ambulatory Visit: Payer: Self-pay | Admitting: Family Medicine

## 2020-06-21 ENCOUNTER — Telehealth: Payer: Self-pay | Admitting: Family Medicine

## 2020-06-21 DIAGNOSIS — R109 Unspecified abdominal pain: Secondary | ICD-10-CM

## 2020-06-21 NOTE — Telephone Encounter (Signed)
Order has been signed.

## 2020-06-21 NOTE — Addendum Note (Signed)
Addended by: Rodrigo Ran on: 06/21/2020 08:14 AM   Modules accepted: Orders

## 2020-06-21 NOTE — Telephone Encounter (Signed)
[name] is calling to advise they need a new order.   States she needs a regular CT abdomen and Pelvis with Contrast.  Please advise.

## 2020-06-21 NOTE — Telephone Encounter (Signed)
Please review pended order - [facility] needed order changed.

## 2020-06-29 ENCOUNTER — Ambulatory Visit (HOSPITAL_COMMUNITY)
Admission: EM | Admit: 2020-06-29 | Discharge: 2020-06-29 | Disposition: A | Discharge status: Home / Self Care | Payer: Medicare Other | Attending: Internal Medicine | Admitting: Internal Medicine

## 2020-06-29 ENCOUNTER — Ambulatory Visit (INDEPENDENT_AMBULATORY_CARE_PROVIDER_SITE_OTHER): Payer: Medicare Other

## 2020-06-29 ENCOUNTER — Encounter (HOSPITAL_COMMUNITY): Payer: Self-pay | Admitting: Emergency Medicine

## 2020-06-29 ENCOUNTER — Other Ambulatory Visit: Payer: Self-pay

## 2020-06-29 DIAGNOSIS — K5900 Constipation, unspecified: Secondary | ICD-10-CM | POA: Diagnosis not present

## 2020-06-29 DIAGNOSIS — R109 Unspecified abdominal pain: Secondary | ICD-10-CM | POA: Diagnosis not present

## 2020-06-29 LAB — POCT URINALYSIS DIPSTICK, ED / UC
Bilirubin Urine: NEGATIVE
Glucose, UA: NEGATIVE mg/dL
Ketones, ur: NEGATIVE mg/dL
Leukocytes,Ua: NEGATIVE
Nitrite: NEGATIVE
Protein, ur: NEGATIVE mg/dL
Specific Gravity, Urine: 1.02 (ref 1.005–1.030)
Urobilinogen, UA: 0.2 mg/dL (ref 0.0–1.0)
pH: 5.5 (ref 5.0–8.0)

## 2020-06-29 MED ORDER — POLYETHYLENE GLYCOL 3350 17 G PO PACK: 17.0000 g | PACK | Freq: Two times a day (BID) | ORAL | 0 refills | Status: DC

## 2020-06-29 NOTE — ED Triage Notes (Signed)
Pt presents today with abdominal pain and constipation. She reports last good bowel movement was 10 days ago. Denies n/v/d. She does have appt on 07/07/20 for xrays. No relief with OTC meds.
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
## 2020-07-07 ENCOUNTER — Ambulatory Visit
Admission: RE | Admit: 2020-07-07 | Discharge: 2020-07-07 | Disposition: A | Discharge status: Home / Self Care | Payer: Medicare Other | Source: Ambulatory Visit | Attending: Family Medicine | Admitting: Family Medicine

## 2020-07-07 ENCOUNTER — Other Ambulatory Visit: Payer: Self-pay

## 2020-07-07 DIAGNOSIS — R109 Unspecified abdominal pain: Secondary | ICD-10-CM

## 2020-07-07 DIAGNOSIS — R1084 Generalized abdominal pain: Secondary | ICD-10-CM | POA: Diagnosis not present

## 2020-07-07 DIAGNOSIS — I700 Atherosclerosis of aorta: Secondary | ICD-10-CM | POA: Diagnosis not present

## 2020-07-07 DIAGNOSIS — R911 Solitary pulmonary nodule: Secondary | ICD-10-CM | POA: Diagnosis not present

## 2020-07-07 MED ORDER — IOPAMIDOL (ISOVUE-300) INJECTION 61%
80.0000 mL | Freq: Once | INTRAVENOUS | Status: AC | PRN
  Administered 2020-07-07: 80 mL via INTRAVENOUS

## 2020-07-13 ENCOUNTER — Encounter: Payer: Self-pay | Admitting: Family Medicine

## 2020-07-13 ENCOUNTER — Other Ambulatory Visit: Payer: Self-pay

## 2020-07-13 ENCOUNTER — Ambulatory Visit (INDEPENDENT_AMBULATORY_CARE_PROVIDER_SITE_OTHER): Payer: Medicare Other | Admitting: Family Medicine

## 2020-07-13 VITALS — BP 124/70 | HR 80 | Resp 16 | Ht 63.0 in | Wt 188.5 lb

## 2020-07-13 DIAGNOSIS — K5900 Constipation, unspecified: Secondary | ICD-10-CM

## 2020-07-13 DIAGNOSIS — E785 Hyperlipidemia, unspecified: Secondary | ICD-10-CM

## 2020-07-13 DIAGNOSIS — M48062 Spinal stenosis, lumbar region with neurogenic claudication: Secondary | ICD-10-CM | POA: Diagnosis not present

## 2020-07-13 DIAGNOSIS — N1832 Chronic kidney disease, stage 3b: Secondary | ICD-10-CM

## 2020-07-13 DIAGNOSIS — I10 Essential (primary) hypertension: Secondary | ICD-10-CM

## 2020-07-13 LAB — LIPID PANEL
Cholesterol: 135 mg/dL (ref 0–200)
HDL: 45.5 mg/dL (ref >39.00)
LDL Cholesterol: 74 mg/dL (ref 0–99)
NonHDL: 89.9
Total CHOL/HDL Ratio: 3
Triglycerides: 78 mg/dL (ref 0.0–149.0)
VLDL: 15.6 mg/dL (ref 0.0–40.0)

## 2020-07-13 NOTE — Patient Instructions (Addendum)
A few things to remember from today's visit:   Dyslipidemia, goal LDL below 70  Lumbar stenosis with neurogenic claudication  Constipation, unspecified constipation type  Stage 3b chronic kidney disease ([place])  If you need refills please call your pharmacy. Do not use My Chart to request refills or for acute issues that need immediate attention.   Continue over the counter stool softener daily at night if you do not have a bowel movement that day.  Be sure you have an appt with your nephrologist. Low back and thigh pain is most likely caused by spinal stenosis.Let me know if you decide to see ortho. Continue monitoring blood pressure.  Please be sure medication list is accurate. If a new problem present, please set up appointment sooner than planned today.
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
## 2020-07-14 ENCOUNTER — Telehealth: Payer: Self-pay | Admitting: Family Medicine

## 2020-07-14 DIAGNOSIS — M48062 Spinal stenosis, lumbar region with neurogenic claudication: Secondary | ICD-10-CM

## 2020-07-14 NOTE — Telephone Encounter (Signed)
Patient is calling and wanted to let the provider know that she did want to get the shots in her back, please advise. CB is [phone]

## 2020-07-18 NOTE — Telephone Encounter (Signed)
Referral placed.

## 2020-07-18 NOTE — Telephone Encounter (Signed)
Please advise 

## 2020-07-20 ENCOUNTER — Telehealth: Payer: Self-pay | Admitting: Family Medicine

## 2020-07-20 NOTE — Telephone Encounter (Signed)
Pt call and stated she want dr.Jordan to set up the appt fot her to have a shot in her back .

## 2020-07-20 NOTE — Telephone Encounter (Signed)
I called and spoke with patient. She is aware referral was placed & that they tried to contact her. Office phone number given to patient to call and set up appointment.

## 2020-07-25 ENCOUNTER — Ambulatory Visit (INDEPENDENT_AMBULATORY_CARE_PROVIDER_SITE_OTHER): Payer: Medicare Other | Admitting: Orthopaedic Surgery

## 2020-07-25 ENCOUNTER — Other Ambulatory Visit: Payer: Self-pay

## 2020-07-25 ENCOUNTER — Ambulatory Visit: Payer: Self-pay

## 2020-07-25 DIAGNOSIS — M545 Low back pain, unspecified: Secondary | ICD-10-CM | POA: Diagnosis not present
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
## 2020-07-27 DIAGNOSIS — I2699 Other pulmonary embolism without acute cor pulmonale: Secondary | ICD-10-CM | POA: Diagnosis not present

## 2020-07-27 DIAGNOSIS — M1990 Unspecified osteoarthritis, unspecified site: Secondary | ICD-10-CM | POA: Diagnosis not present

## 2020-07-27 DIAGNOSIS — I2510 Atherosclerotic heart disease of native coronary artery without angina pectoris: Secondary | ICD-10-CM | POA: Diagnosis not present

## 2020-07-27 DIAGNOSIS — E785 Hyperlipidemia, unspecified: Secondary | ICD-10-CM | POA: Diagnosis not present

## 2020-07-27 DIAGNOSIS — I10 Essential (primary) hypertension: Secondary | ICD-10-CM | POA: Diagnosis not present

## 2020-07-27 DIAGNOSIS — N189 Chronic kidney disease, unspecified: Secondary | ICD-10-CM | POA: Diagnosis not present

## 2020-08-09 ENCOUNTER — Other Ambulatory Visit: Payer: Self-pay

## 2020-08-10 ENCOUNTER — Ambulatory Visit: Payer: Medicare Other | Admitting: Orthopaedic Surgery

## 2020-08-10 ENCOUNTER — Ambulatory Visit: Payer: Medicare Other | Admitting: Family Medicine

## 2020-08-10 ENCOUNTER — Ambulatory Visit (INDEPENDENT_AMBULATORY_CARE_PROVIDER_SITE_OTHER): Payer: Medicare Other | Admitting: Family Medicine

## 2020-08-10 ENCOUNTER — Encounter: Payer: Self-pay | Admitting: Family Medicine

## 2020-08-10 VITALS — BP 124/64 | HR 75 | Temp 97.7°F | Wt 195.0 lb

## 2020-08-10 DIAGNOSIS — M48062 Spinal stenosis, lumbar region with neurogenic claudication: Secondary | ICD-10-CM

## 2020-08-10 NOTE — Patient Instructions (Signed)
[employer] neurological surgery (7th ed., pp. 2373-2383). [place], [place]: Elsevier."> [employer] neurological surgery (7th ed., pp. 2344-2347). [place], [place]: [employer].">  Spinal Stenosis  Spinal stenosis is a condition that happens when the spinal canal narrows. The spinal canal is the space between the bones of your spine (vertebrae). This narrowing puts pressure on the spinal cord or nerves. Spinal stenosis can affect the vertebrae in the neck, upper back, and lower back. This condition can range from mild to severe. In some cases, there are no symptoms. What are the causes? This condition is caused by areas of bone pushing into the spinal canal. This condition may be present at birth (congenital), or it may be caused by:  Slow breakdown of your vertebrae (spinal degeneration). This usually starts around 79 years of age.  Injury (trauma) to your spine.  Tumors in your spine.  Calcium deposits in your spine. What increases the risk? The following factors may make you more likely to develop this condition:  Being older than age 50.  Having a problem present at birth with an abnormally shaped spine (congenitalspinal deformity), such as scoliosis.  Having arthritis. What are the signs or symptoms? Symptoms of this condition include:  Pain in the neck or back that is generally worse with activities, particularly when you stand or walk.  Numbness, tingling, hot or cold sensations, weakness, or tiredness (fatigue) in your leg or legs.  Pain going from the buttock, down the thigh, and to the calf (sciatica). This can happen in one or both legs.  Frequent episodes of falling.  A foot-slapping gait that leads to muscle weakness. In more severe cases, you may develop:  Problems having a bowel movement or urinating.  Difficulty having sex.  Loss of feeling in your legs and inability to walk. Symptoms may come on slowly and get worse over time. In some cases, there  are no symptoms. How is this diagnosed? This condition is diagnosed based on your medical history and a physical exam. You will also have tests, such as an MRI, a CT scan, or an X-ray. How is this treated? Treatment for this condition often focuses on managing your pain and any other symptoms. Treatment may include:  Practicing good posture to lessen pressure on your nerves.  Exercising to strengthen muscles, build endurance, improve balance, and maintain range of motion. This may include physical therapy to restore movement and strength to your back.  Losing weight, if needed.  Medicines to reduce inflammation or pain. This may include a medicine that is injected into your spine (steroidinjection).  Assistive devices, such as a corset or brace. In some cases, surgery may be needed. The most common procedure is decompression laminectomy. This is done to remove excess bone that puts pressure on your nerve roots. Follow these instructions at home: Managing pain, stiffness, and swelling  Practice good posture. If you were given a brace or a corset, wear it as told by your health care provider.  Maintain a healthy weight. Talk with your health care provider if you need help losing weight.  If directed, apply heat to the affected area as often as told by your health care provider. Use the heat source that your health care provider recommends, such as a moist heat pack or a heating pad. ? Place a towel between your skin and the heat source. ? Leave the heat on for 20-30 minutes. ? Remove the heat if your skin turns bright red. This is especially important if   you are unable to feel pain, heat, or cold. You may have a greater risk of getting burned.   Activity  Do all exercises and stretches as told by your health care provider.  Do not do any activities that cause pain. Ask your health care provider what activities are safe for you.  Do not lift anything that is heavier than 10 lb (4.5 kg),  or the limit that you are told by your health care provider.  Return to your normal activities as told by your health care provider. Ask your health care provider what activities are safe for you. General instructions  Take over-the-counter and prescription medicines only as told by your health care provider.  Do not use any products that contain nicotine or tobacco, such as cigarettes, e-cigarettes, and chewing tobacco. If you need help quitting, ask your health care provider.  Eat a healthy diet. This includes plenty of fruits and vegetables, whole grains, and low-fat (lean) protein.  Keep all follow-up visits as told by your health care provider. This is important. Contact a health care provider if:  Your symptoms do not get better or they get worse.  You have a fever. Get help right away if:  You have new pain or symptoms of severe pain, such as: ? New or worsening pain in your neck or upper back. ? Severe pain that cannot be controlled with medicines. ? A severe headache that gets worse when you stand.  You are dizzy.  You have vision problems, such as blurred vision or double vision.  You have nausea or you vomit.  You develop new or worsening numbness or tingling in your back or legs.  You have pain, redness, swelling, or warmth in your arm or leg. Summary  Spinal stenosis is a condition that happens when the spinal canal narrows. The spinal canal is the space between the bones of your spine (vertebrae). This narrowing puts pressure on the spinal cord or nerves.  This condition may be caused by a birth defect, breakdown of your vertebrae, trauma, tumors, or calcium deposits.  Spinal stenosis can cause numbness, weakness, or pain in the buttocks, neck, back, and legs.  This condition is usually diagnosed with your medical history, a physical exam, and tests, such as an MRI, a CT scan, or an X-ray. This information is not intended to replace advice given to you by your  health care provider. Make sure you discuss any questions you have with your health care provider. Document Revised: 01/15/2019 Document Reviewed: 01/15/2019 Elsevier Patient Education  2021 [place].  Try increasing the Cymbalta 30 mg to two tablets daily.    Give this a couple of weeks and let Dr Martinique know if this is helping.
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
## 2020-08-18 ENCOUNTER — Ambulatory Visit (INDEPENDENT_AMBULATORY_CARE_PROVIDER_SITE_OTHER): Payer: Medicare Other

## 2020-08-18 ENCOUNTER — Other Ambulatory Visit: Payer: Self-pay

## 2020-08-18 VITALS — BP 124/68 | HR 87 | Temp 98.3°F | Ht 63.0 in | Wt 188.0 lb

## 2020-08-18 DIAGNOSIS — Z1211 Encounter for screening for malignant neoplasm of colon: Secondary | ICD-10-CM | POA: Diagnosis not present

## 2020-08-18 DIAGNOSIS — Z0000 Encounter for general adult medical examination without abnormal findings: Secondary | ICD-10-CM
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
## 2020-08-18 NOTE — Patient Instructions (Addendum)
[name] , Thank you for taking time to come for your Medicare Wellness Visit. I appreciate your ongoing commitment to your health goals. Please review the following plan we discussed and let me know if I can assist you in the future.   Screening recommendations/referrals: Colonoscopy: current due 11/28/2020  Mammogram: referral completed  08/18/2020 Bone Density: current 08/11/2021 Recommended yearly ophthalmology/optometry visit for glaucoma screening and checkup Recommended yearly dental visit for hygiene and checkup  Vaccinations: Influenza vaccine: current due fall 2022 Pneumococcal vaccine: completed series Tdap vaccine: current due 06/10/2029 Shingles vaccine: will obtain local pharmacy     Advanced directives: will provide copies   Conditions/risks identified: none   Next appointment: none    Preventive Care 78 Years and Older, Female Preventive care refers to lifestyle choices and visits with your health care provider that can promote health and wellness. What does preventive care include?  A yearly physical exam. This is also called an annual well check.  Dental exams once or twice a year.  Routine eye exams. Ask your health care provider how often you should have your eyes checked.  Personal lifestyle choices, including:  Daily care of your teeth and gums.  Regular physical activity.  Eating a healthy diet.  Avoiding tobacco and drug use.  Limiting alcohol use.  Practicing safe sex.  Taking low-dose aspirin every day.  Taking vitamin and mineral supplements as recommended by your health care provider. What happens during an annual well check? The services and screenings done by your health care provider during your annual well check will depend on your age, overall health, lifestyle risk factors, and family history of disease. Counseling  Your health care provider may ask you questions about your:  Alcohol use.  Tobacco use.  Drug  use.  Emotional well-being.  Home and relationship well-being.  Sexual activity.  Eating habits.  History of falls.  Memory and ability to understand (cognition).  Work and work Statistician.  Reproductive health. Screening  You may have the following tests or measurements:  Height, weight, and BMI.  Blood pressure.  Lipid and cholesterol levels. These may be checked every 5 years, or more frequently if you are over 65 years old.  Skin check.  Lung cancer screening. You may have this screening every year starting at age 47 if you have a 30-pack-year history of smoking and currently smoke or have quit within the past 15 years.  Fecal occult blood test (FOBT) of the stool. You may have this test every year starting at age 71.  Flexible sigmoidoscopy or colonoscopy. You may have a sigmoidoscopy every 5 years or a colonoscopy every 10 years starting at age 16.  Hepatitis C blood test.  Hepatitis B blood test.  Sexually transmitted disease (STD) testing.  Diabetes screening. This is done by checking your blood sugar (glucose) after you have not eaten for a while (fasting). You may have this done every 1-3 years.  Bone density scan. This is done to screen for osteoporosis. You may have this done starting at age 43.  Mammogram. This may be done every 1-2 years. Talk to your health care provider about how often you should have regular mammograms. Talk with your health care provider about your test results, treatment options, and if necessary, the need for more tests. Vaccines  Your health care provider may recommend certain vaccines, such as:  Influenza vaccine. This is recommended every year.  Tetanus, diphtheria, and acellular pertussis (Tdap, Td) vaccine. You may need a  Td booster every 10 years.  Zoster vaccine. You may need this after age 35.  Pneumococcal 13-valent conjugate (PCV13) vaccine. One dose is recommended after age 49.  Pneumococcal polysaccharide  (PPSV23) vaccine. One dose is recommended after age 66. Talk to your health care provider about which screenings and vaccines you need and how often you need them. This information is not intended to replace advice given to you by your health care provider. Make sure you discuss any questions you have with your health care provider. Document Released: 04/15/2015 Document Revised: 12/07/2015 Document Reviewed: 01/18/2015 Elsevier Interactive Patient Education  2017 [place] Prevention in the Home Falls can cause injuries. They can happen to people of all ages. There are many things you can do to make your home safe and to help prevent falls. What can I do on the outside of my home?  Regularly fix the edges of walkways and driveways and fix any cracks.  Remove anything that might make you trip as you walk through a door, such as a raised step or threshold.  Trim any bushes or trees on the path to your home.  Use bright outdoor lighting.  Clear any walking paths of anything that might make someone trip, such as rocks or tools.  Regularly check to see if handrails are loose or broken. Make sure that both sides of any steps have handrails.  Any raised decks and porches should have guardrails on the edges.  Have any leaves, snow, or ice cleared regularly.  Use sand or salt on walking paths during winter.  Clean up any spills in your garage right away. This includes oil or grease spills. What can I do in the bathroom?  Use night lights.  Install grab bars by the toilet and in the tub and shower. Do not use towel bars as grab bars.  Use non-skid mats or decals in the tub or shower.  If you need to sit down in the shower, use a plastic, non-slip stool.  Keep the floor dry. Clean up any water that spills on the floor as soon as it happens.  Remove soap buildup in the tub or shower regularly.  Attach bath mats securely with double-sided non-slip rug tape.  Do not have  throw rugs and other things on the floor that can make you trip. What can I do in the bedroom?  Use night lights.  Make sure that you have a light by your bed that is easy to reach.  Do not use any sheets or blankets that are too big for your bed. They should not hang down onto the floor.  Have a firm chair that has side arms. You can use this for support while you get dressed.  Do not have throw rugs and other things on the floor that can make you trip. What can I do in the kitchen?  Clean up any spills right away.  Avoid walking on wet floors.  Keep items that you use a lot in easy-to-reach places.  If you need to reach something above you, use a strong step stool that has a grab bar.  Keep electrical cords out of the way.  Do not use floor polish or wax that makes floors slippery. If you must use wax, use non-skid floor wax.  Do not have throw rugs and other things on the floor that can make you trip. What can I do with my stairs?  Do not leave any items on the stairs.  Make sure that there are handrails on both sides of the stairs and use them. Fix handrails that are broken or loose. Make sure that handrails are as long as the stairways.  Check any carpeting to make sure that it is firmly attached to the stairs. Fix any carpet that is loose or worn.  Avoid having throw rugs at the top or bottom of the stairs. If you do have throw rugs, attach them to the floor with carpet tape.  Make sure that you have a light switch at the top of the stairs and the bottom of the stairs. If you do not have them, ask someone to add them for you. What else can I do to help prevent falls?  Wear shoes that:  Do not have high heels.  Have rubber bottoms.  Are comfortable and fit you well.  Are closed at the toe. Do not wear sandals.  If you use a stepladder:  Make sure that it is fully opened. Do not climb a closed stepladder.  Make sure that both sides of the stepladder are  locked into place.  Ask someone to hold it for you, if possible.  Clearly mark and make sure that you can see:  Any grab bars or handrails.  First and last steps.  Where the edge of each step is.  Use tools that help you move around (mobility aids) if they are needed. These include:  Canes.  Walkers.  Scooters.  Crutches.  Turn on the lights when you go into a dark area. Replace any light bulbs as soon as they burn out.  Set up your furniture so you have a clear path. Avoid moving your furniture around.  If any of your floors are uneven, fix them.  If there are any pets around you, be aware of where they are.  Review your medicines with your doctor. Some medicines can make you feel dizzy. This can increase your chance of falling. Ask your doctor what other things that you can do to help prevent falls. This information is not intended to replace advice given to you by your health care provider. Make sure you discuss any questions you have with your health care provider. Document Released: 01/13/2009 Document Revised: 08/25/2015 Document Reviewed: 04/23/2014 Elsevier Interactive Patient Education  2017 [employer].

## 2020-09-16 ENCOUNTER — Other Ambulatory Visit: Payer: Self-pay | Admitting: Family Medicine

## 2020-09-21 ENCOUNTER — Ambulatory Visit (HOSPITAL_COMMUNITY)
Admission: EM | Admit: 2020-09-21 | Discharge: 2020-09-21 | Disposition: A | Discharge status: Home / Self Care | Payer: Medicare Other | Attending: Emergency Medicine | Admitting: Emergency Medicine

## 2020-09-21 ENCOUNTER — Other Ambulatory Visit: Payer: Self-pay

## 2020-09-21 ENCOUNTER — Encounter (HOSPITAL_COMMUNITY): Payer: Self-pay

## 2020-09-21 ENCOUNTER — Ambulatory Visit (INDEPENDENT_AMBULATORY_CARE_PROVIDER_SITE_OTHER): Payer: Medicare Other

## 2020-09-21 ENCOUNTER — Other Ambulatory Visit: Payer: Self-pay | Admitting: Family Medicine

## 2020-09-21 DIAGNOSIS — Z96651 Presence of right artificial knee joint: Secondary | ICD-10-CM | POA: Diagnosis not present

## 2020-09-21 DIAGNOSIS — M25562 Pain in left knee: Secondary | ICD-10-CM | POA: Diagnosis not present

## 2020-09-21 DIAGNOSIS — S92514A Nondisplaced fracture of proximal phalanx of right lesser toe(s), initial encounter for closed fracture: Secondary | ICD-10-CM

## 2020-09-21 DIAGNOSIS — M797 Fibromyalgia: Secondary | ICD-10-CM

## 2020-09-21 DIAGNOSIS — M1712 Unilateral primary osteoarthritis, left knee: Secondary | ICD-10-CM | POA: Diagnosis not present

## 2020-09-21 DIAGNOSIS — Z471 Aftercare following joint replacement surgery: Secondary | ICD-10-CM | POA: Diagnosis not present

## 2020-09-21 DIAGNOSIS — M25561 Pain in right knee: Secondary | ICD-10-CM | POA: Diagnosis not present

## 2020-09-21 DIAGNOSIS — M79671 Pain in right foot: Secondary | ICD-10-CM | POA: Diagnosis not present

## 2020-09-21 DIAGNOSIS — M159 Polyosteoarthritis, unspecified: Secondary | ICD-10-CM

## 2020-09-21 MED ORDER — PREDNISONE 50 MG PO TABS: 50.0000 mg | ORAL_TABLET | Freq: Every day | ORAL | 0 refills | Status: DC

## 2020-09-21 NOTE — ED Triage Notes (Signed)
Pt c/o right foot and toe pain X 2 months. She states she has also been having knee pain X 2 months. She states she feels she is loosing her legs.
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
## 2020-09-22 ENCOUNTER — Other Ambulatory Visit: Payer: Self-pay | Admitting: *Deleted

## 2020-09-22 ENCOUNTER — Telehealth: Payer: Self-pay | Admitting: Family Medicine

## 2020-09-22 MED ORDER — MEMANTINE HCL 10 MG PO TABS: 10.0000 mg | ORAL_TABLET | Freq: Two times a day (BID) | ORAL | 11 refills | Status: DC

## 2020-09-22 NOTE — Telephone Encounter (Signed)
Pt call and stated she need a refill on  DULoxetine (CYMBALTA) 30 MG capsule sent to  [id number] - [place], [address] Phone:  [phone]  Fax:  [phone]

## 2020-09-23 NOTE — Telephone Encounter (Signed)
Rx sent on 06/22

## 2020-09-27 ENCOUNTER — Telehealth: Payer: Self-pay | Admitting: Family Medicine

## 2020-09-27 NOTE — Telephone Encounter (Signed)
Form completed & on pcp's desk for signature.

## 2020-09-27 NOTE — Telephone Encounter (Signed)
Patient dropped off a disability placard to have completed before or on 06/30.  Call patient to pick up when completed at: [phone]  Disposition: Dr's Folder

## 2020-09-28 NOTE — Telephone Encounter (Signed)
I called and spoke with patient. She is aware that form is completed & up front for pick up.

## 2020-09-30 ENCOUNTER — Other Ambulatory Visit: Payer: Self-pay

## 2020-09-30 ENCOUNTER — Ambulatory Visit (HOSPITAL_COMMUNITY)
Admission: EM | Admit: 2020-09-30 | Discharge: 2020-09-30 | Disposition: A | Discharge status: Home / Self Care | Payer: Medicare Other

## 2020-09-30 ENCOUNTER — Encounter (HOSPITAL_COMMUNITY): Payer: Self-pay

## 2020-09-30 DIAGNOSIS — S92354D Nondisplaced fracture of fifth metatarsal bone, right foot, subsequent encounter for fracture with routine healing: Secondary | ICD-10-CM | POA: Diagnosis not present

## 2020-09-30 DIAGNOSIS — M79671 Pain in right foot: Secondary | ICD-10-CM | POA: Diagnosis not present

## 2020-09-30 NOTE — Discharge Instructions (Addendum)
[facility] Address: [address], [place], [place] [address] Phone: [phone] 
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
## 2020-09-30 NOTE — ED Triage Notes (Signed)
Pt presents with right foot pain.   States the pain has been off and on. Pt states she was here 6/22 and states the pain has gotten worse.   Pt states she was given Prednisone that caused her to itch.

## 2020-10-07 ENCOUNTER — Other Ambulatory Visit: Payer: Self-pay | Admitting: Family Medicine

## 2020-10-07 DIAGNOSIS — M48062 Spinal stenosis, lumbar region with neurogenic claudication: Secondary | ICD-10-CM

## 2020-10-07 DIAGNOSIS — M797 Fibromyalgia: Secondary | ICD-10-CM

## 2020-10-07 MED ORDER — DULOXETINE HCL 60 MG PO CPEP: 60.0000 mg | ORAL_CAPSULE | Freq: Every day | ORAL | 1 refills | Status: DC

## 2020-10-20 ENCOUNTER — Ambulatory Visit (INDEPENDENT_AMBULATORY_CARE_PROVIDER_SITE_OTHER): Payer: Medicare Other

## 2020-10-20 ENCOUNTER — Ambulatory Visit (INDEPENDENT_AMBULATORY_CARE_PROVIDER_SITE_OTHER): Payer: Medicare Other | Admitting: Podiatry

## 2020-10-20 ENCOUNTER — Encounter: Payer: Self-pay | Admitting: Podiatry

## 2020-10-20 ENCOUNTER — Other Ambulatory Visit: Payer: Self-pay

## 2020-10-20 DIAGNOSIS — S92514A Nondisplaced fracture of proximal phalanx of right lesser toe(s), initial encounter for closed fracture: Secondary | ICD-10-CM | POA: Diagnosis not present

## 2020-10-20 DIAGNOSIS — S99921A Unspecified injury of right foot, initial encounter: Secondary | ICD-10-CM

## 2020-10-20 DIAGNOSIS — I890 Lymphedema, not elsewhere classified: Secondary | ICD-10-CM | POA: Diagnosis not present

## 2020-10-20 NOTE — Progress Notes (Signed)
Subjective:   Patient ID: [name], female   DOB: 80 y.o.   MRN: [id number]   HPI Patient presents stating that about a month ago she fractured the fifth toe of the right foot and states it still been swollen and sore and she is concerned.  Patient also is getting some swelling in her foot in general wanted to get that checked and patient is wearing open toed shoe currently.  Patient does not smoke is not active currently   Review of Systems  All other systems reviewed and are negative.      Objective:  Physical Exam Vitals and nursing note reviewed.  Constitutional:      Appearance: She is well-developed.  Pulmonary:     Effort: Pulmonary effort is normal.  Musculoskeletal:        General: Normal range of motion.  Skin:    General: Skin is warm.  Neurological:     Mental Status: She is alert.    Neurovascular status found to be intact muscle strength found to be within normal limits with patient noted to have significant edema fifth digit right both at the proximal and the more distal portions of the proximal phalanx.  Very sore when pressed localized no other pathology noted good digital perfusion well oriented x3     Assessment:  Contusion versus fracture of the fifth digit right foot with pain still present     Plan:  H&P x-ray reviewed.  I discussed fracture and educated her on 2 different fractures of her fifth digit and at this point I do not see joint damage even though there could be in the proximal portion ultimately may require removal of small segment and I educated her on this.  At this time we will continue open toed shoes and I do think in the next 6 to 8 weeks healing should occur and if not we will need to get more aggressive with this which I educated her on today  X-rays indicate fracture of the base of the proximal phalanx medial side and the head of the proximal phalanx with indications of healing

## 2020-10-24 ENCOUNTER — Telehealth (INDEPENDENT_AMBULATORY_CARE_PROVIDER_SITE_OTHER): Payer: Medicare Other | Admitting: Family Medicine

## 2020-10-24 ENCOUNTER — Encounter: Payer: Self-pay | Admitting: Family Medicine

## 2020-10-24 VITALS — Ht 63.0 in

## 2020-10-24 DIAGNOSIS — M797 Fibromyalgia: Secondary | ICD-10-CM

## 2020-10-24 DIAGNOSIS — J309 Allergic rhinitis, unspecified: Secondary | ICD-10-CM | POA: Diagnosis not present

## 2020-10-24 NOTE — Progress Notes (Signed)
Virtual Visit via Telephone Note I connected with [name] on 10/24/20 at  3:30 PM EDT by telephone and verified that I am speaking with the correct person using two identifiers.   I discussed the limitations, risks, security and privacy concerns of performing an evaluation and management service by telephone and the availability of in person appointments. I also discussed with the patient that there may be a patient responsible charge related to this service. The patient expressed understanding and agreed to proceed.  Location patient: home Location provider: work office Participants present for the call: patient, provider Patient did not have a visit in the prior 7 days to address this/these issue(s).  Chief Complaint  Patient presents with   flu like symptoms   Dizziness   History of Present Illness: Ms. Metzgar is a 80 yo female with hx DM2, generalized OA, fibromyalgia, chronic back pain, asthma, mild cognitive impairment, GERD, hypertension, and CAD with above concerns.  Yesterday she woke up with myalgias, bilateral ankle pain, mild rhinorrhea, and myalgias. Dizziness has been intermittently for a while. Feeling unbalance.  She has been checking BP regularly and readings are "fine."  Mild nonproductive cough, "not bad",she has history of chronic cough. Yesterday she had subjective fever. + Fatigue. She states that she had similar symptoms twice last month and resolved spontaneously. No changes in appetite. She had headache last night, mild. She has not tried OTC medications.  She has not noted nasal congestion, sore throat, anosmia, ageusia, wheezing, dyspnea, abdominal pain, changes in bowel habits, nausea/vomiting, urinary symptoms, or skin rash. No sick contact or recent travel. She is wearing her mask most of the time.  COVID-19 vaccination completed + boosters x2.  Observations/Objective: Patient sounds cheerful and well on the phone. I do not  appreciate any SOB. Speech and thought processing are grossly intact. Patient reported vitals:Ht '5\' 3"'$  (1.6 m)   BMI 33.30 kg/m   Assessment and Plan:  1. Allergic rhinitis, unspecified seasonality, unspecified trigger This problem could explain some of her symptomatology, which seems to be recurrent since 08/2020. We discussed differential diagnosis, including infectious process. COVID-19 test recommended. Monitor for fever and/or new symptoms. She has Flonase nasal spray at home, recommend starting a daily as needed. Nasal saline irrigations.  2. Fibromyalgia Myalgias chills reported could be related with this problem. Low impact physical activity and adequate/good sleep hygiene. Tylenol 500 mg 3-4 times per day as needed.  Follow Up Instructions: Return if symptoms worsen or fail to improve.  I did not refer this patient for an OV in the next 24 hours for this/these issue(s). She was clearly instructed about warning signs. Fall precautions. Continue monitoring BP regularly.  We discussed limitation of telephone visits. I discussed the assessment and treatment plan with the patient. Ms. Greenslade was provided an opportunity to ask questions and all were answered.She agreed with the plan and demonstrated an understanding of the instructions.   The patient was advised to call back or seek an in-person evaluation if the symptoms worsen or if the condition fails to improve as anticipated.  I provided 17 minutes of non-face-to-face time during this encounter.  Sheree Lalla Martinique, MD

## 2020-11-01 ENCOUNTER — Other Ambulatory Visit: Payer: Self-pay
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
## 2020-11-02 ENCOUNTER — Encounter: Payer: Self-pay | Admitting: Family Medicine

## 2020-11-02 ENCOUNTER — Ambulatory Visit (INDEPENDENT_AMBULATORY_CARE_PROVIDER_SITE_OTHER): Payer: Medicare Other | Admitting: Family Medicine

## 2020-11-02 VITALS — BP 110/60 | HR 78 | Resp 16 | Ht 63.0 in | Wt 189.0 lb

## 2020-11-02 DIAGNOSIS — I10 Essential (primary) hypertension: Secondary | ICD-10-CM

## 2020-11-02 DIAGNOSIS — R569 Unspecified convulsions: Secondary | ICD-10-CM | POA: Diagnosis not present

## 2020-11-02 DIAGNOSIS — M797 Fibromyalgia: Secondary | ICD-10-CM

## 2020-11-02 DIAGNOSIS — Y92009 Unspecified place in unspecified non-institutional (private) residence as the place of occurrence of the external cause: Secondary | ICD-10-CM | POA: Diagnosis not present

## 2020-11-02 DIAGNOSIS — I700 Atherosclerosis of aorta: Secondary | ICD-10-CM | POA: Diagnosis not present

## 2020-11-02 DIAGNOSIS — N1832 Chronic kidney disease, stage 3b: Secondary | ICD-10-CM | POA: Diagnosis not present

## 2020-11-02 DIAGNOSIS — W19XXXA Unspecified fall, initial encounter: Secondary | ICD-10-CM | POA: Diagnosis not present

## 2020-11-02 DIAGNOSIS — M159 Polyosteoarthritis, unspecified: Secondary | ICD-10-CM | POA: Diagnosis not present

## 2020-11-02 LAB — BASIC METABOLIC PANEL
BUN: 31 mg/dL — ABNORMAL HIGH (ref 6–23)
CO2: 27 mEq/L (ref 19–32)
Calcium: 9.7 mg/dL (ref 8.4–10.5)
Chloride: 98 mEq/L (ref 96–112)
Creatinine, Ser: 1.76 mg/dL — ABNORMAL HIGH (ref 0.40–1.20)
GFR: 27.26 mL/min — ABNORMAL LOW (ref >60.00)
Glucose, Bld: 106 mg/dL — ABNORMAL HIGH (ref 70–99)
Potassium: 4.9 mEq/L (ref 3.5–5.1)
Sodium: 134 mEq/L — ABNORMAL LOW (ref 135–145)

## 2020-11-02 NOTE — Assessment & Plan Note (Addendum)
According to pt, she does not need to continue following with nephrologist. Cr. 1.5-1.6 , e GFR 37-45, 12/2017 e GFR 29 and 03/2020 31. Adequate BP controlled, hydration,and low salt diet. Avoid NSAID's. Continue Avapro.

## 2020-11-02 NOTE — Assessment & Plan Note (Addendum)
It has improved. Low impact exercise. Duloxetine 60 mg daily to continue. Some side effects discussed.

## 2020-11-02 NOTE — Assessment & Plan Note (Signed)
Reporting some low BP's at home. Decrease carvedilol from 25 mg to 12.5 mg bid. Continue monitoring BP.

## 2020-11-02 NOTE — Assessment & Plan Note (Signed)
Continue Cymbalta 60 mg daily. Low impact exercise. Adequate sleep hygiene.

## 2020-11-02 NOTE — Patient Instructions (Addendum)
A few things to remember from today's visit:  Fibromyalgia  Stage 3b chronic kidney disease ([place])  Essential hypertension  Fall in home, initial encounter  Atherosclerosis of aorta ([place]), Chronic  Morbid obesity ([place]), Chronic  Seizure ([place]), Chronic  If you need refills please call your pharmacy. Do not use My Chart to request refills or for acute issues that need immediate attention.   Carvedilol decreased from 25 mg 2 times daily to 12.5 mg 2 times daily. No changes in rest. Monitor BP daily.  Please be sure medication list is accurate. If a new problem present, please set up appointment sooner than planned today.

## 2020-11-03 ENCOUNTER — Telehealth: Payer: Self-pay | Admitting: Family Medicine

## 2020-11-03 NOTE — Chronic Care Management (AMB) (Signed)
  Chronic Care Management   Note  11/03/2020 Name: [name] MRN: [id number] DOB: 19-Jul-1940  Cassidy Julian Zakarian is a 80 y.o. year old female who is a primary care patient of Martinique, Malka So, MD. I reached out to [employer] by phone today in response to a referral sent by Ms. [name]'s PCP, Martinique, Betty G, MD.   [name] was given information about Chronic Care Management services today including:  CCM service includes personalized support from designated clinical staff supervised by her physician, including individualized plan of care and coordination with other care providers 24/7 contact phone numbers for assistance for urgent and routine care needs. Service will only be billed when office clinical staff spend 20 minutes or more in a month to coordinate care. Only one practitioner may furnish and bill the service in a calendar month. The patient may stop CCM services at any time (effective at the end of the month) by phone call to the office staff.   Patient agreed to services and verbal consent obtained.   Follow up plan:   [place] Secretary/administrator

## 2020-11-13 ENCOUNTER — Other Ambulatory Visit: Payer: Self-pay | Admitting: Family Medicine

## 2020-11-13 DIAGNOSIS — I10 Essential (primary) hypertension: Secondary | ICD-10-CM

## 2020-11-13 DIAGNOSIS — N1832 Chronic kidney disease, stage 3b: Secondary | ICD-10-CM

## 2020-11-21 ENCOUNTER — Encounter (HOSPITAL_COMMUNITY): Payer: Self-pay

## 2020-11-21 ENCOUNTER — Other Ambulatory Visit: Payer: Self-pay

## 2020-11-21 ENCOUNTER — Ambulatory Visit (HOSPITAL_COMMUNITY)
Admission: EM | Admit: 2020-11-21 | Discharge: 2020-11-21 | Disposition: A | Discharge status: Home / Self Care | Payer: Medicare Other | Attending: Family Medicine | Admitting: Family Medicine

## 2020-11-21 DIAGNOSIS — F419 Anxiety disorder, unspecified: Secondary | ICD-10-CM | POA: Diagnosis not present

## 2020-11-21 DIAGNOSIS — M797 Fibromyalgia: Secondary | ICD-10-CM | POA: Insufficient documentation

## 2020-11-21 DIAGNOSIS — E1122 Type 2 diabetes mellitus with diabetic chronic kidney disease: Secondary | ICD-10-CM | POA: Diagnosis not present

## 2020-11-21 DIAGNOSIS — Z7952 Long term (current) use of systemic steroids: Secondary | ICD-10-CM | POA: Insufficient documentation

## 2020-11-21 DIAGNOSIS — Z7901 Long term (current) use of anticoagulants: Secondary | ICD-10-CM | POA: Insufficient documentation

## 2020-11-21 DIAGNOSIS — I129 Hypertensive chronic kidney disease with stage 1 through stage 4 chronic kidney disease, or unspecified chronic kidney disease: Secondary | ICD-10-CM | POA: Insufficient documentation

## 2020-11-21 DIAGNOSIS — Z20822 Contact with and (suspected) exposure to covid-19: Secondary | ICD-10-CM | POA: Diagnosis not present

## 2020-11-21 DIAGNOSIS — R5383 Other fatigue: Secondary | ICD-10-CM

## 2020-11-21 DIAGNOSIS — R6883 Chills (without fever): Secondary | ICD-10-CM | POA: Diagnosis not present

## 2020-11-21 DIAGNOSIS — Z791 Long term (current) use of non-steroidal anti-inflammatories (NSAID): Secondary | ICD-10-CM | POA: Insufficient documentation

## 2020-11-21 DIAGNOSIS — F32A Depression, unspecified: Secondary | ICD-10-CM | POA: Diagnosis not present

## 2020-11-21 DIAGNOSIS — E0500 Thyrotoxicosis with diffuse goiter without thyrotoxic crisis or storm: Secondary | ICD-10-CM | POA: Insufficient documentation

## 2020-11-21 DIAGNOSIS — J45909 Unspecified asthma, uncomplicated: Secondary | ICD-10-CM | POA: Diagnosis not present

## 2020-11-21 DIAGNOSIS — R059 Cough, unspecified: Secondary | ICD-10-CM | POA: Diagnosis not present

## 2020-11-21 DIAGNOSIS — N183 Chronic kidney disease, stage 3 unspecified: Secondary | ICD-10-CM | POA: Insufficient documentation

## 2020-11-21 DIAGNOSIS — Z79899 Other long term (current) drug therapy: Secondary | ICD-10-CM | POA: Diagnosis not present

## 2020-11-21 DIAGNOSIS — E785 Hyperlipidemia, unspecified: Secondary | ICD-10-CM | POA: Insufficient documentation

## 2020-11-21 LAB — COMPREHENSIVE METABOLIC PANEL
ALT: 16 U/L (ref 0–44)
AST: 17 U/L (ref 15–41)
Albumin: 3.4 g/dL — ABNORMAL LOW (ref 3.5–5.0)
Alkaline Phosphatase: 95 U/L (ref 38–126)
Anion gap: 7 (ref 5–15)
BUN: 26 mg/dL — ABNORMAL HIGH (ref 8–23)
CO2: 27 mmol/L (ref 22–32)
Calcium: 9.5 mg/dL (ref 8.9–10.3)
Chloride: 103 mmol/L (ref 98–111)
Creatinine, Ser: 1.74 mg/dL — ABNORMAL HIGH (ref 0.44–1.00)
GFR, Estimated: 29 mL/min — ABNORMAL LOW (ref >60)
Glucose, Bld: 103 mg/dL — ABNORMAL HIGH (ref 70–99)
Potassium: 4.7 mmol/L (ref 3.5–5.1)
Sodium: 137 mmol/L (ref 135–145)
Total Bilirubin: 0.5 mg/dL (ref 0.3–1.2)
Total Protein: 7.3 g/dL (ref 6.5–8.1)

## 2020-11-21 LAB — POCT URINALYSIS DIPSTICK, ED / UC
Bilirubin Urine: NEGATIVE
Glucose, UA: NEGATIVE mg/dL
Hgb urine dipstick: NEGATIVE
Ketones, ur: NEGATIVE mg/dL
Leukocytes,Ua: NEGATIVE
Nitrite: NEGATIVE
Protein, ur: NEGATIVE mg/dL
Specific Gravity, Urine: 1.01 (ref 1.005–1.030)
Urobilinogen, UA: 0.2 mg/dL (ref 0.0–1.0)
pH: 5.5 (ref 5.0–8.0)

## 2020-11-21 LAB — CBC WITH DIFFERENTIAL/PLATELET
Abs Immature Granulocytes: 0.02 10*3/uL (ref 0.00–0.07)
Basophils Absolute: 0 10*3/uL (ref 0.0–0.1)
Basophils Relative: 0 %
Eosinophils Absolute: 0.1 10*3/uL (ref 0.0–0.5)
Eosinophils Relative: 2 %
HCT: 44.6 % (ref 36.0–46.0)
Hemoglobin: 14 g/dL (ref 12.0–15.0)
Immature Granulocytes: 0 %
Lymphocytes Relative: 37 %
Lymphs Abs: 2.5 10*3/uL (ref 0.7–4.0)
MCH: 29.6 pg (ref 26.0–34.0)
MCHC: 31.4 g/dL (ref 30.0–36.0)
MCV: 94.3 fL (ref 80.0–100.0)
Monocytes Absolute: 0.6 10*3/uL (ref 0.1–1.0)
Monocytes Relative: 9 %
Neutro Abs: 3.4 10*3/uL (ref 1.7–7.7)
Neutrophils Relative %: 52 %
Platelets: 246 10*3/uL (ref 150–400)
RBC: 4.73 MIL/uL (ref 3.87–5.11)
RDW: 13.6 % (ref 11.5–15.5)
WBC: 6.7 10*3/uL (ref 4.0–10.5)
nRBC: 0 % (ref 0.0–0.2)

## 2020-11-21 LAB — CBG MONITORING, ED: Glucose-Capillary: 110 mg/dL — ABNORMAL HIGH (ref 70–99)
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
## 2020-11-21 NOTE — ED Triage Notes (Addendum)
Pt in with c/o cough, fever and fatigue x 2 weeks  Pt states she has not appetite  States she has not been taking any otc medication for sx

## 2020-11-22 LAB — SARS CORONAVIRUS 2 (TAT 6-24 HRS): SARS Coronavirus 2: NEGATIVE

## 2020-11-23 ENCOUNTER — Encounter: Payer: Medicare Other | Attending: Family Medicine | Admitting: Dietician

## 2020-12-06 DIAGNOSIS — Z23 Encounter for immunization: Secondary | ICD-10-CM | POA: Diagnosis not present

## 2020-12-06 DIAGNOSIS — I129 Hypertensive chronic kidney disease with stage 1 through stage 4 chronic kidney disease, or unspecified chronic kidney disease: Secondary | ICD-10-CM | POA: Diagnosis not present

## 2020-12-06 DIAGNOSIS — I5030 Unspecified diastolic (congestive) heart failure: Secondary | ICD-10-CM | POA: Diagnosis not present

## 2020-12-06 DIAGNOSIS — N183 Chronic kidney disease, stage 3 unspecified: Secondary | ICD-10-CM | POA: Diagnosis not present

## 2020-12-06 DIAGNOSIS — N179 Acute kidney failure, unspecified: Secondary | ICD-10-CM | POA: Diagnosis not present

## 2020-12-06 DIAGNOSIS — E1122 Type 2 diabetes mellitus with diabetic chronic kidney disease: Secondary | ICD-10-CM | POA: Diagnosis not present

## 2020-12-15 ENCOUNTER — Other Ambulatory Visit: Payer: Self-pay | Admitting: Family Medicine

## 2020-12-20 ENCOUNTER — Other Ambulatory Visit: Payer: Self-pay | Admitting: Family Medicine

## 2020-12-20 DIAGNOSIS — K219 Gastro-esophageal reflux disease without esophagitis: Secondary | ICD-10-CM

## 2020-12-22 ENCOUNTER — Telehealth: Payer: Self-pay | Admitting: Family Medicine

## 2020-12-22 ENCOUNTER — Telehealth: Payer: Self-pay | Admitting: Pharmacist

## 2020-12-22 MED ORDER — NITROGLYCERIN 0.4 MG SL SUBL: 0.4000 mg | SUBLINGUAL_TABLET | SUBLINGUAL | 1 refills | Status: DC | PRN

## 2020-12-22 NOTE — Addendum Note (Signed)
Addended by: Rebecca Eaton on: 12/22/2020 03:42 PM   Modules accepted: Orders

## 2020-12-22 NOTE — Chronic Care Management (AMB) (Signed)
Chronic Care Management Pharmacy Assistant   Name: [name]  MRN: [id number] DOB: 1940/11/29  Reason for Encounter: Chart prep for initial visit with [name] the clinical pharmacist on    Conditions to be addressed/monitored: CAD, HTN, HLD, DMII, CKD Stage 3, and Insomnia and GERD  Recent office visits:  11/02/2020 Cassidy Martinique MD (PCP) - Patient was seen for Stage 3b chronic kidney disease and additional issues. No medication changes. Follow up in 4 months.  10/24/2020 Cassidy Martinique MD (PCP) - Patient was seen for Allergic rhinitis, unspecified seasonality and additional issues. No medication changes. Follow up if symptoms worsen.   08/18/2020 Cassidy Pigg, LPN - Patient was seen for Medicare Wellness Exam. No medication changes. Follow up in 1 year.  08/10/2020 [name], [name], MD (Family Medicine) - Patient was seen for Lumbar stenosis with neurogenic claudication. No medication changes. No follow up noted.  07/13/2020 Cassidy Martinique MD (PCP) - Patient was seen for Lumbar stenosis and additional issues. Discontinued Polyethylene Glycol. Follow up in 6 months.  Recent consult visits:  10/20/2020 [name] DPM (Podiatry) - Patient was seen for Injury of right foot and additional issues. No medication changes. Follow up if symptoms worsen.  07/25/2020 Cassidy Perna MD (Orthopedic Surgery) - Patient was seen for Low back pain, unspecified back pain laterality, unspecified chronicity, unspecified whether sciatica present. No medication changes. No follow up noted.  Hospital visits:  11/21/2020 [name] (1hour) [name], [place] - Patient was seen for Fatigue, cough and chills. No medications given. Follow up with PCP in 3 days.  09/30/2020 [name] (26 min) [name], [place] - Patient was seen for right foot pain. No medication changes. Follow up with PCP.  09/21/2020 [facility] (2 hours) [name], [name], PA-C - Patient was seen for Closed nondisplaced fracture of proximal phalanx of lesser toe of right foot and additional issues. Started on Prednisone 50mg . Follow up with PCP as needed.  06/29/2020 [name] (1 hours) Cassidy Closs, PA-C - Patient was seen for constipation. Started on Polyethylene Glycol 17gm twice daily. Follow up with PCP.  Medications: Outpatient Encounter Medications as of 12/22/2020  Medication Sig   acetaminophen (TYLENOL) 160 MG/5ML suspension Take 15.6 mLs (500 mg total) by mouth every 6 (six) hours as needed for mild pain or moderate pain.   albuterol (PROAIR HFA) 108 (90 Base) MCG/ACT inhaler INHALE 2 PUFFS BY MOUTH EVERY 6 HOURS AS NEEDED FOR WHEEZING   atorvastatin (LIPITOR) 40 MG tablet TAKE 1 TABLET(40 MG) BY MOUTH DAILY AT 6 PM   carvedilol (COREG) 25 MG tablet Take 25 mg by mouth 2 (two) times daily.   diclofenac Sodium (VOLTAREN) 1 % GEL Apply 2 g topically 4 (four) times daily.   diphenhydrAMINE (BENYLIN) 12.5 MG/5ML syrup Take 5 mLs (12.5 mg total) by mouth at bedtime.   DULoxetine (CYMBALTA) 60 MG capsule Take 1 capsule (60 mg total) by mouth daily.   ELIQUIS 5 MG TABS tablet TAKE 1 TABLET(5 MG) BY MOUTH TWICE DAILY   famotidine (PEPCID) 20 MG tablet TAKE 1 TABLET(20 MG) BY MOUTH AT BEDTIME   furosemide (LASIX) 40 MG tablet Take 1 tablet (40 mg total) by mouth daily.   irbesartan (AVAPRO) 150 MG tablet TAKE 1 TABLET(150 MG) BY MOUTH DAILY   LamoTRIgine 200 MG TB24 24 hour tablet Take 1 tablet (200 mg total) by mouth at  bedtime.   memantine (NAMENDA) 10 MG tablet Take 1 tablet (10 mg total) by mouth 2 (two) times daily.   montelukast (SINGULAIR) 10 MG tablet TAKE 1 TABLET(10 MG) BY MOUTH EVERY DAY IN THE EVENING   Multiple Vitamin (MULTIVITAMIN) tablet Take by mouth.   nitroGLYCERIN (NITROSTAT) 0.4 MG SL tablet Place 0.4 mg under the tongue every 5 (five) minutes as needed for chest  pain.    pantoprazole (PROTONIX) 40 MG tablet TAKE 1 TABLET(40 MG) BY MOUTH DAILY   predniSONE (DELTASONE) 50 MG tablet Take 1 tablet (50 mg total) by mouth daily with breakfast.   spironolactone (ALDACTONE) 25 MG tablet Take 25 mg by mouth daily.   No facility-administered encounter medications on file as of 12/22/2020.   Fill History:  ELIQUIS 5MG  TABLETS 12/20/2020 90   ATORVASTATIN 40MG  TABLETS 12/16/2020 90   CARVEDILOL 25MG  TABLETS 11/01/2020 90   DULOXETINE DR 60MG  CAPSULES 10/25/2020 90   FAMOTIDINE 20MG  TABLETS 09/21/2020 90   FUROSEMIDE 40MG  TABLETS 04/27/2020 90   IRBESARTAN 150MG  TABLETS 11/15/2020 90   MEMANTINE HYDROCHLORIDE  10 MG TABS 12/20/2020 90   MONTELUKAST 10MG  TABLETS 12/16/2020 90   PANTOPRAZOLE 40MG  TABLETS 12/18/2020 90   SPIRONOLACTONE 25MG  TABLETS 09/06/2020 90   LAMOTRIGINE ER  200 MG TB24 12/20/2020 90   Have you seen any other providers since your last visit? **yes Patient states she saw [name] for her kidneys recently. Spironolactone was discontinued.   Any changes in your medications or health? Yes Prednisone and Spironolactone have been discontinued.  Any side effects from any medications? no  Do you have an symptoms or problems not managed by your medications? no  Any concerns about your health right now? no  Has your provider asked that you check blood pressure, blood sugar, or follow special diet at home?  Patient diet is generally healthy, Breakfast is chicken sausage or [place] bacon with eggs, fresh apple and toast. Lunch is a protein shake. Dinner is a meat and veggetable with occasional rice.   Do you get any type of exercise on a regular basis? Yes Patient walks twice daily, stairs twice daily and stretching.  Can you think of a goal you would like to reach for your health? Patient would like to loose 10-20 lbs.  Do you have any problems getting your medications? no  Is there anything that you would like to discuss  during the appointment? No  Please bring medications blood pressure cuff and supplements to appointment  Care Gaps:  AWV - completed 08/18/2020 Zoster vaccine - never done Covid 19 vaccine booster 4 - overdue Flu vaccine - due HGA1C -overdue Foot exam - overdue Colonoscopy - overdue   Star Rating Drugs:  Irbesartan 150mg  -last filled 11/15/2020 90DS at [facility] Atorvastatin 40mg  -last filled 12/16/2020 90DS at [place]

## 2020-12-22 NOTE — Telephone Encounter (Signed)
Rx sent in. Spoke with the patient and she has been made aware.

## 2020-12-22 NOTE — Telephone Encounter (Signed)
PT called to see if Dr.Jordan would call her in nitroGLYCERIN (NITROSTAT) 0.4 MG SL tablet to the pharmacy on file. Please advise.

## 2020-12-26 ENCOUNTER — Ambulatory Visit (INDEPENDENT_AMBULATORY_CARE_PROVIDER_SITE_OTHER): Payer: Medicare Other | Admitting: Neurology

## 2020-12-26 ENCOUNTER — Ambulatory Visit: Payer: Medicare Other | Admitting: Neurology

## 2020-12-26 ENCOUNTER — Encounter: Payer: Self-pay | Admitting: Neurology

## 2020-12-26 ENCOUNTER — Other Ambulatory Visit: Payer: Self-pay

## 2020-12-26 VITALS — BP 109/68 | HR 67

## 2020-12-26 DIAGNOSIS — G3184 Mild cognitive impairment, so stated: Secondary | ICD-10-CM | POA: Diagnosis not present

## 2020-12-26 DIAGNOSIS — R569 Unspecified convulsions: Secondary | ICD-10-CM

## 2020-12-26 DIAGNOSIS — R519 Headache, unspecified: Secondary | ICD-10-CM | POA: Diagnosis not present

## 2020-12-26 MED ORDER — BUTALBITAL-APAP-CAFFEINE 50-325-40 MG PO TABS: 1.0000 | ORAL_TABLET | Freq: Four times a day (QID) | ORAL | 5 refills | Status: DC | PRN

## 2020-12-26 MED ORDER — LAMOTRIGINE ER 200 MG PO TB24: 200.0000 mg | ORAL_TABLET | Freq: Every day | ORAL | 4 refills | Status: DC

## 2020-12-26 NOTE — Patient Instructions (Signed)
Meds ordered this encounter  Medications   LamoTRIgine 200 MG TB24 24 hour tablet    Sig: Take 1 tablet (200 mg total) by mouth at bedtime.    Dispense:  90 tablet    Refill:  4   butalbital-acetaminophen-caffeine (FIORICET) 50-325-40 MG tablet    Sig: Take 1 tablet by mouth every 6 (six) hours as needed for headache.    Dispense:  12 tablet    Refill:  5    Do not refill in less than 30 days
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
## 2020-12-27 ENCOUNTER — Ambulatory Visit: Payer: Medicare Other

## 2020-12-27 DIAGNOSIS — K5900 Constipation, unspecified: Secondary | ICD-10-CM | POA: Diagnosis not present

## 2020-12-27 DIAGNOSIS — Z86010 Personal history of colonic polyps: Secondary | ICD-10-CM | POA: Diagnosis not present

## 2020-12-28 ENCOUNTER — Telehealth: Payer: Self-pay | Admitting: Family Medicine

## 2020-12-28 DIAGNOSIS — Z1211 Encounter for screening for malignant neoplasm of colon: Secondary | ICD-10-CM

## 2020-12-28 NOTE — Telephone Encounter (Signed)
PT called to advise that she was refer to a place within [facility] but she had went to [facility] and would like to get the referral switch back to the place within [facility] [place] [facility]. Please advise.

## 2020-12-28 NOTE — Telephone Encounter (Signed)
Referral updated & re-entered.

## 2021-01-05 ENCOUNTER — Telehealth: Payer: Self-pay | Admitting: Pharmacist

## 2021-01-05 NOTE — Progress Notes (Signed)
    Chronic Care Management Pharmacy Assistant   Name: [name]  MRN: [id number] DOB: 04/11/1940  Reason for Encounter: Reschedule initial visit.    Medications: Outpatient Encounter Medications as of 01/05/2021  Medication Sig   albuterol (PROAIR HFA) 108 (90 Base) MCG/ACT inhaler INHALE 2 PUFFS BY MOUTH EVERY 6 HOURS AS NEEDED FOR WHEEZING   atorvastatin (LIPITOR) 40 MG tablet TAKE 1 TABLET(40 MG) BY MOUTH DAILY AT 6 PM   butalbital-acetaminophen-caffeine (FIORICET) 50-325-40 MG tablet Take 1 tablet by mouth every 6 (six) hours as needed for headache.   carvedilol (COREG) 25 MG tablet Take 25 mg by mouth 2 (two) times daily.   diclofenac Sodium (VOLTAREN) 1 % GEL Apply 2 g topically 4 (four) times daily.   diphenhydrAMINE (BENYLIN) 12.5 MG/5ML syrup Take 5 mLs (12.5 mg total) by mouth at bedtime.   DULoxetine (CYMBALTA) 60 MG capsule Take 1 capsule (60 mg total) by mouth daily.   ELIQUIS 5 MG TABS tablet TAKE 1 TABLET(5 MG) BY MOUTH TWICE DAILY   famotidine (PEPCID) 20 MG tablet TAKE 1 TABLET(20 MG) BY MOUTH AT BEDTIME   furosemide (LASIX) 40 MG tablet Take 1 tablet (40 mg total) by mouth daily.   irbesartan (AVAPRO) 150 MG tablet TAKE 1 TABLET(150 MG) BY MOUTH DAILY   LamoTRIgine 200 MG TB24 24 hour tablet Take 1 tablet (200 mg total) by mouth at bedtime.   memantine (NAMENDA) 10 MG tablet Take 1 tablet (10 mg total) by mouth 2 (two) times daily.   montelukast (SINGULAIR) 10 MG tablet TAKE 1 TABLET(10 MG) BY MOUTH EVERY DAY IN THE EVENING   Multiple Vitamin (MULTIVITAMIN) tablet Take by mouth.   nitroGLYCERIN (NITROSTAT) 0.4 MG SL tablet Place 1 tablet (0.4 mg total) under the tongue every 5 (five) minutes as needed for chest pain.   pantoprazole (PROTONIX) 40 MG tablet TAKE 1 TABLET(40 MG) BY MOUTH DAILY   No facility-administered encounter medications on file as of 01/05/2021.   Notes: Patient (reschedule initial visit) for 02/28/2021 at 11:00  Care Gaps: AWV -  completed 08/18/2020 Shingrix - never done Covid vaccine booster 4 - overdue Flu - due HGA1C - overdue Foot exam - overdue Colonoscopy - overdue  Star Rating Drugs: Atorvastatin  40 mg - last filled 12/16/2020 90DS at [facility] Irbesartan 150 mg - last filled 11/15/2020 90DS at [place] Pharmacist Assistant [phone]

## 2021-01-25 DIAGNOSIS — R0789 Other chest pain: Secondary | ICD-10-CM | POA: Diagnosis not present

## 2021-01-25 DIAGNOSIS — I2510 Atherosclerotic heart disease of native coronary artery without angina pectoris: Secondary | ICD-10-CM | POA: Diagnosis not present

## 2021-02-03 ENCOUNTER — Encounter: Payer: Self-pay | Admitting: Family Medicine

## 2021-02-03 ENCOUNTER — Telehealth: Payer: Self-pay

## 2021-02-03 DIAGNOSIS — R42 Dizziness and giddiness: Secondary | ICD-10-CM | POA: Diagnosis not present

## 2021-02-03 NOTE — Telephone Encounter (Signed)
Caller states she has a patient (patient navigator with insurance) experiencing confusion and has forgot to take some of her medications. Patient reports that her words have been getting confused and she feels like her mind just isn't right. Patient reports confusion started on Wednesday she thinks. Has missed doses of Eliquis  02/03/2021 1:34:45 PM Call EMS 911 Now Yes Ricard Dillon, RN, Shelda Jakes Caller Disagree/Comply Comply Caller Understands Yes  Referrals [facility] - ED  11/4/ 1649: No ED note in chart. Pt called & states Paramedics came to house, evaluated her, & stated that she was fine. Pt states she still feels confused - states she is unable to find the words. This has been going on since 02/01/21. Pt advised to f/u with PCP. Appt made for [facility] Nov 7.

## 2021-02-06 ENCOUNTER — Ambulatory Visit (INDEPENDENT_AMBULATORY_CARE_PROVIDER_SITE_OTHER): Payer: Medicare Other | Admitting: Family Medicine

## 2021-02-06 ENCOUNTER — Other Ambulatory Visit: Payer: Self-pay | Admitting: Family Medicine

## 2021-02-06 ENCOUNTER — Encounter: Payer: Self-pay | Admitting: Family Medicine

## 2021-02-06 ENCOUNTER — Other Ambulatory Visit: Payer: Self-pay

## 2021-02-06 VITALS — BP 110/70 | HR 93 | Resp 16 | Ht 63.0 in | Wt 189.0 lb

## 2021-02-06 DIAGNOSIS — N1832 Chronic kidney disease, stage 3b: Secondary | ICD-10-CM

## 2021-02-06 DIAGNOSIS — M159 Polyosteoarthritis, unspecified: Secondary | ICD-10-CM | POA: Diagnosis not present

## 2021-02-06 DIAGNOSIS — I10 Essential (primary) hypertension: Secondary | ICD-10-CM | POA: Diagnosis not present

## 2021-02-06 DIAGNOSIS — R42 Dizziness and giddiness: Secondary | ICD-10-CM

## 2021-02-06 DIAGNOSIS — E1149 Type 2 diabetes mellitus with other diabetic neurological complication: Secondary | ICD-10-CM

## 2021-02-06 DIAGNOSIS — R479 Unspecified speech disturbances: Secondary | ICD-10-CM | POA: Diagnosis not present

## 2021-02-06 DIAGNOSIS — Z1231 Encounter for screening mammogram for malignant neoplasm of breast: Secondary | ICD-10-CM

## 2021-02-06 LAB — BASIC METABOLIC PANEL
BUN: 19 mg/dL (ref 6–23)
CO2: 29 mEq/L (ref 19–32)
Calcium: 9.4 mg/dL (ref 8.4–10.5)
Chloride: 102 mEq/L (ref 96–112)
Creatinine, Ser: 1.34 mg/dL — ABNORMAL HIGH (ref 0.40–1.20)
GFR: 37.74 mL/min — ABNORMAL LOW (ref >60.00)
Glucose, Bld: 92 mg/dL (ref 70–99)
Potassium: 4 mEq/L (ref 3.5–5.1)
Sodium: 138 mEq/L (ref 135–145)

## 2021-02-06 LAB — CBC
HCT: 43.6 % (ref 36.0–46.0)
Hemoglobin: 13.9 g/dL (ref 12.0–15.0)
MCHC: 32 g/dL (ref 30.0–36.0)
MCV: 91.1 fl (ref 78.0–100.0)
Platelets: 221 10*3/uL (ref 150.0–400.0)
RBC: 4.78 Mil/uL (ref 3.87–5.11)
RDW: 13.7 % (ref 11.5–15.5)
WBC: 6 10*3/uL (ref 4.0–10.5)

## 2021-02-06 LAB — HEMOGLOBIN A1C: Hgb A1c MFr Bld: 5.9 % (ref 4.6–6.5)

## 2021-02-06 NOTE — Patient Instructions (Addendum)
A few things to remember from today's visit:   Difficulty with speech - Plan: MR Brain W Wo Contrast, CBC  Dizziness - Plan: MR Brain W Wo Contrast, CBC  Type 2 diabetes mellitus with neurological complications ([place]) - Plan: Basic metabolic panel, Hemoglobin A1c, Fructosamine  Generalized osteoarthritis of multiple sites  Essential hypertension  If you need refills please call your pharmacy. Do not use My Chart to request refills or for acute issues that need immediate attention.   Be sure you are taking Avapro 150 mg daily. Rest no changes. Fall precautions. Please contact your neurologist and arrange an appt.  Please be sure medication list is accurate. If a new problem present, please set up appointment sooner than planned today.
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
## 2021-02-08 NOTE — Progress Notes (Signed)
I left a voicemail for patient to return my call.

## 2021-02-09 ENCOUNTER — Telehealth: Payer: Self-pay | Admitting: Family Medicine

## 2021-02-09 ENCOUNTER — Encounter: Payer: Self-pay | Admitting: Family Medicine

## 2021-02-09 LAB — FRUCTOSAMINE: Fructosamine: 226 umol/L (ref 205–285)

## 2021-02-09 NOTE — Telephone Encounter (Signed)
Pt call and stated she have a rash until her breast and want dr.Jordan to sent her something in to  [id number] - [address], [place] AT [place] Phone:  [phone]  Fax:  [phone]

## 2021-02-10 ENCOUNTER — Other Ambulatory Visit: Payer: Self-pay

## 2021-02-10 ENCOUNTER — Emergency Department (HOSPITAL_BASED_OUTPATIENT_CLINIC_OR_DEPARTMENT_OTHER)
Admission: EM | Admit: 2021-02-10 | Discharge: 2021-02-10 | Disposition: A | Discharge status: Home / Self Care | Payer: Medicare Other | Attending: Emergency Medicine | Admitting: Emergency Medicine

## 2021-02-10 ENCOUNTER — Encounter (HOSPITAL_BASED_OUTPATIENT_CLINIC_OR_DEPARTMENT_OTHER): Payer: Self-pay

## 2021-02-10 ENCOUNTER — Emergency Department (HOSPITAL_BASED_OUTPATIENT_CLINIC_OR_DEPARTMENT_OTHER): Payer: Medicare Other

## 2021-02-10 DIAGNOSIS — N183 Chronic kidney disease, stage 3 unspecified: Secondary | ICD-10-CM | POA: Diagnosis not present

## 2021-02-10 DIAGNOSIS — Z79899 Other long term (current) drug therapy: Secondary | ICD-10-CM | POA: Diagnosis not present

## 2021-02-10 DIAGNOSIS — J45909 Unspecified asthma, uncomplicated: Secondary | ICD-10-CM | POA: Insufficient documentation

## 2021-02-10 DIAGNOSIS — Z96651 Presence of right artificial knee joint: Secondary | ICD-10-CM | POA: Insufficient documentation

## 2021-02-10 DIAGNOSIS — I129 Hypertensive chronic kidney disease with stage 1 through stage 4 chronic kidney disease, or unspecified chronic kidney disease: Secondary | ICD-10-CM | POA: Diagnosis not present

## 2021-02-10 DIAGNOSIS — Z7722 Contact with and (suspected) exposure to environmental tobacco smoke (acute) (chronic): Secondary | ICD-10-CM | POA: Diagnosis not present

## 2021-02-10 DIAGNOSIS — E1122 Type 2 diabetes mellitus with diabetic chronic kidney disease: Secondary | ICD-10-CM | POA: Insufficient documentation

## 2021-02-10 DIAGNOSIS — Z7901 Long term (current) use of anticoagulants: Secondary | ICD-10-CM | POA: Diagnosis not present

## 2021-02-10 DIAGNOSIS — R600 Localized edema: Secondary | ICD-10-CM | POA: Insufficient documentation

## 2021-02-10 DIAGNOSIS — K5900 Constipation, unspecified: Secondary | ICD-10-CM | POA: Diagnosis not present

## 2021-02-10 DIAGNOSIS — K219 Gastro-esophageal reflux disease without esophagitis: Secondary | ICD-10-CM | POA: Diagnosis not present

## 2021-02-10 DIAGNOSIS — R109 Unspecified abdominal pain: Secondary | ICD-10-CM | POA: Diagnosis not present

## 2021-02-10 LAB — LIPASE, BLOOD: Lipase: 80 U/L — ABNORMAL HIGH (ref 11–51)

## 2021-02-10 LAB — COMPREHENSIVE METABOLIC PANEL
ALT: 13 U/L (ref 0–44)
AST: 13 U/L — ABNORMAL LOW (ref 15–41)
Albumin: 3.6 g/dL (ref 3.5–5.0)
Alkaline Phosphatase: 92 U/L (ref 38–126)
Anion gap: 6 (ref 5–15)
BUN: 17 mg/dL (ref 8–23)
CO2: 29 mmol/L (ref 22–32)
Calcium: 9.1 mg/dL (ref 8.9–10.3)
Chloride: 104 mmol/L (ref 98–111)
Creatinine, Ser: 1.61 mg/dL — ABNORMAL HIGH (ref 0.44–1.00)
GFR, Estimated: 32 mL/min — ABNORMAL LOW (ref >60)
Glucose, Bld: 89 mg/dL (ref 70–99)
Potassium: 4.5 mmol/L (ref 3.5–5.1)
Sodium: 139 mmol/L (ref 135–145)
Total Bilirubin: 0.3 mg/dL (ref 0.3–1.2)
Total Protein: 7.3 g/dL (ref 6.5–8.1)

## 2021-02-10 LAB — URINALYSIS, ROUTINE W REFLEX MICROSCOPIC
Bilirubin Urine: NEGATIVE
Glucose, UA: NEGATIVE mg/dL
Hgb urine dipstick: NEGATIVE
Ketones, ur: NEGATIVE mg/dL
Leukocytes,Ua: NEGATIVE
Nitrite: NEGATIVE
Specific Gravity, Urine: 1.017 (ref 1.005–1.030)
pH: 6 (ref 5.0–8.0)

## 2021-02-10 LAB — CBC
HCT: 43.5 % (ref 36.0–46.0)
Hemoglobin: 13.3 g/dL (ref 12.0–15.0)
MCH: 28.4 pg (ref 26.0–34.0)
MCHC: 30.6 g/dL (ref 30.0–36.0)
MCV: 92.9 fL (ref 80.0–100.0)
Platelets: 191 10*3/uL (ref 150–400)
RBC: 4.68 MIL/uL (ref 3.87–5.11)
RDW: 13.3 % (ref 11.5–15.5)
WBC: 5.8 10*3/uL (ref 4.0–10.5)
nRBC: 0 % (ref 0.0–0.2)

## 2021-02-10 MED ORDER — POLYETHYLENE GLYCOL 3350 17 GM/SCOOP PO POWD: 17.0000 g | Freq: Every day | ORAL | 0 refills | Status: DC

## 2021-02-10 MED ORDER — HYDROCORTISONE 1 % EX CREA: 1.0000 "application " | TOPICAL_CREAM | Freq: Two times a day (BID) | CUTANEOUS | 0 refills | Status: DC

## 2021-02-10 MED ORDER — IOHEXOL 300 MG/ML  SOLN
75.0000 mL | Freq: Once | INTRAMUSCULAR | Status: AC | PRN
  Administered 2021-02-10: 75 mL via INTRAVENOUS

## 2021-02-10 MED ORDER — IOHEXOL 350 MG/ML SOLN: 60.0000 mL | Freq: Once | INTRAVENOUS | Status: DC | PRN

## 2021-02-10 MED ORDER — IOHEXOL 350 MG/ML SOLN: 75.0000 mL | Freq: Once | INTRAVENOUS | Status: DC | PRN

## 2021-02-10 NOTE — ED Triage Notes (Addendum)
Patient here POV from Home with ABD Pain.  Patient states it began approximately 2-3 days PTA. Pain is mainly oriented to RLQ and is non-radiating.  No N/V/D. Mild Constipation. No Fevers. No Urinary Symptoms.

## 2021-02-10 NOTE — Discharge Instructions (Addendum)
You have been evaluated for your abdominal pain.  Fortunately your labs and your CT scan did not show any concerning finding.  Your symptoms are likely due to mild constipation. Please take MiraLAX as prescribed and follow-up closely with your primary care doctor for further care.  Return if you have any concern.
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
## 2021-02-10 NOTE — ED Notes (Signed)
Patient transported to CT 

## 2021-02-10 NOTE — Telephone Encounter (Signed)
I called and spoke with patient. She is aware that Rx has been sent into her pharmacy.

## 2021-02-10 NOTE — ED Notes (Signed)
Pt verbalizes understanding of discharge instructions. Opportunity for questioning and answers were provided. Armand removed by staff, pt discharged from ED to home. Educated to pick up OTC or Rx laxatives.

## 2021-02-10 NOTE — Telephone Encounter (Signed)
If rash is pruritic , she can try OTC Hydrocortisone 1% bid prn. Thanks, BJ

## 2021-02-13 ENCOUNTER — Other Ambulatory Visit: Payer: Self-pay

## 2021-02-13 ENCOUNTER — Ambulatory Visit
Admission: RE | Admit: 2021-02-13 | Discharge: 2021-02-13 | Disposition: A | Discharge status: Home / Self Care | Payer: Medicare Other | Source: Ambulatory Visit | Attending: Family Medicine | Admitting: Family Medicine

## 2021-02-13 DIAGNOSIS — R42 Dizziness and giddiness: Secondary | ICD-10-CM | POA: Diagnosis not present

## 2021-02-13 DIAGNOSIS — R479 Unspecified speech disturbances: Secondary | ICD-10-CM

## 2021-02-13 MED ORDER — GADOBENATE DIMEGLUMINE 529 MG/ML IV SOLN
19.0000 mL | Freq: Once | INTRAVENOUS | Status: AC | PRN
  Administered 2021-02-13: 19 mL via INTRAVENOUS

## 2021-02-20 ENCOUNTER — Telehealth: Payer: Self-pay | Admitting: Pharmacist

## 2021-02-20 NOTE — Chronic Care Management (AMB) (Signed)
Chronic Care Management Pharmacy Assistant   Name: [name]  MRN: [id number] DOB: 18-Feb-1941  [name] is an 80 y.o. year old female who presents for her initial CCM visit with the clinical pharmacist.  Reason for Encounter: Chart prep for initial visit with Jeni Salles the clinical pharmacist on 02/28/2021   Conditions to be addressed/monitored: HTN, DMII, CKD Stage 3, GERD, and Osteopenia  Recent office visits:  11/02/2020 Cassidy Martinique MD - Patient was seen for Stage 3 chronic kidney disease and additional issues. No medications changes. Follow up in 4 months.  10/24/2020 Cassidy Martinique MD - Patient was seen for Allergic Rhinitis. No medication changes. Follow up if symptoms worsen or fail to improve.  08/18/2020 Cassidy Pigg LPN - Medicare annual wellness exam.  Recent consult visits:  12/26/2020 Cassidy Pacas MD (neurology) - Patient was seen for seizure and additional issues. Started Butalbital Apap Caffeine 50-325-40 q 6 hrs prn. Discontinued Tylenol, Prednisone and Spironolactone. Follow up in 1 year.  10/20/2020 [name] DPM (podiatry) - Patient was seen for injury to right foot and additional issues. No medication changes.  Follow up if symptoms worsen or fail to improve.   Hospital visits:  02/10/2021 Patient was seen at [facility] ED for constipation.  Patient was discharged 02/10/2021 after 4 hours.  Patient was started on Polyethylene Glycol 17gms daily.  11/21/2020 Patient was seen at [facility].  Patient was seen for fatigue and additional issues. No medication changes. Follow up with primary care.  09/30/2020 Patient was seen at [facility].  Patient was seen for right foot pain and an additional issue. No medication changes. No follow up noted.  09/21/2020 Patient was seen at [facility].  Patient was seen for closed nondisplaced fracture of the proximal phalanx of lesser toe  of right foot and additional issues. Started Prednisone 50mg  daily.  No follow up noted.  Medications: Outpatient Encounter Medications as of 02/20/2021  Medication Sig   albuterol (PROAIR HFA) 108 (90 Base) MCG/ACT inhaler INHALE 2 PUFFS BY MOUTH EVERY 6 HOURS AS NEEDED FOR WHEEZING   atorvastatin (LIPITOR) 40 MG tablet TAKE 1 TABLET(40 MG) BY MOUTH DAILY AT 6 PM   butalbital-acetaminophen-caffeine (FIORICET) 50-325-40 MG tablet Take 1 tablet by mouth every 6 (six) hours as needed for headache.   carvedilol (COREG) 25 MG tablet Take 12.5 mg by mouth 2 (two) times daily.   diclofenac Sodium (VOLTAREN) 1 % GEL Apply 2 g topically 4 (four) times daily.   diphenhydrAMINE (BENYLIN) 12.5 MG/5ML syrup Take 5 mLs (12.5 mg total) by mouth at bedtime.   DULoxetine (CYMBALTA) 60 MG capsule Take 1 capsule (60 mg total) by mouth daily.   ELIQUIS 5 MG TABS tablet TAKE 1 TABLET(5 MG) BY MOUTH TWICE DAILY   famotidine (PEPCID) 20 MG tablet TAKE 1 TABLET(20 MG) BY MOUTH AT BEDTIME   furosemide (LASIX) 40 MG tablet Take 1 tablet (40 mg total) by mouth daily.   hydrocortisone cream 1 % Apply 1 application topically 2 (two) times daily.   irbesartan (AVAPRO) 150 MG tablet TAKE 1 TABLET(150 MG) BY MOUTH DAILY   LamoTRIgine 200 MG TB24 24 hour tablet Take 1 tablet (200 mg total) by mouth at bedtime.   memantine (NAMENDA) 10 MG tablet Take 1 tablet (10 mg total) by mouth 2 (two) times daily.   montelukast (SINGULAIR) 10 MG tablet TAKE 1 TABLET(10 MG) BY MOUTH EVERY DAY IN THE EVENING  Multiple Vitamin (MULTIVITAMIN) tablet Take by mouth.   nitroGLYCERIN (NITROSTAT) 0.4 MG SL tablet Place 1 tablet (0.4 mg total) under the tongue every 5 (five) minutes as needed for chest pain.   pantoprazole (PROTONIX) 40 MG tablet TAKE 1 TABLET(40 MG) BY MOUTH DAILY   polyethylene glycol powder (GLYCOLAX/MIRALAX) 17 GM/SCOOP powder Take 17 g by mouth daily.   No facility-administered encounter medications on file as of  02/20/2021.   Fill History: ELIQUIS 5MG  TABLETS 12/20/2020 90   ATORVASTATIN 40MG  TABLETS 12/16/2020 90   CARVEDILOL 25MG  TABLETS 11/01/2020 90   DULOXETINE DR 60MG  CAPSULES 01/27/2021 90   FAMOTIDINE 20MG  TABLETS 12/20/2020 90   IRBESARTAN 150MG  TABLETS 11/15/2020 90   MEMANTINE 10MG  TABLETS 12/20/2020 90   MONTELUKAST 10MG  TABLETS 12/16/2020 90   NITROGLYCERIN 0.4MG  SUB TAB 25S 12/22/2020 5   PANTOPRAZOLE 40MG  TABLETS 12/18/2020 90   POLYETH GLYCOL 3350 NF POWDER 238GM 02/11/2021 14   LAMOTRIGINE ER 200MG  TABLETS 12/20/2020 90   Have you seen any other providers since your last visit? **no  Any changes in your medications or health? Yes Prednisone and Spironolactone have been discontinued.  Any side effects from any medications? no  Do you have an symptoms or problems not managed by your medications? no  Any concerns about your health right now? no  Has your provider asked that you check blood pressure, blood sugar, or follow special diet at home? no Patient diet is generally healthy, Breakfast is chicken sausage or [place] bacon with eggs, fresh apple and toast. Lunch is a protein shake. Dinner is a meat and veggetable with occasional rice.  Do you get any type of exercise on a regular basis? Yes Patient walks twice daily, stairs twice daily and stretching.  Can you think of a goal you would like to reach for your health? Patient would like to loose 10-20 lbs.  Do you have any problems getting your medications? no  Is there anything that you would like to discuss during the appointment? No  Please bring medications and supplements to appointment  Care Gaps: AWV - completed 08/18/2020 Shingrix - never done Covid vaccine booster 4 - overdue Flu - due HGA1C - 5.9 on 02/06/2021 BP - 110/70 on 02/06/2021 Eye exam - overdue Colonoscopy - overdue  Star Rating Drugs: Atorvastatin  40 mg - last filled 12/16/2020 90DS at [facility] Irbesartan 150 mg - last filled  11/15/2020 90DS at [place] Pharmacist Assistant [phone]

## 2021-02-22 ENCOUNTER — Ambulatory Visit
Admission: RE | Admit: 2021-02-22 | Discharge: 2021-02-22 | Disposition: A | Discharge status: Home / Self Care | Payer: Medicare Other | Source: Ambulatory Visit | Attending: Family Medicine | Admitting: Family Medicine

## 2021-02-22 DIAGNOSIS — Z1231 Encounter for screening mammogram for malignant neoplasm of breast: Secondary | ICD-10-CM | POA: Diagnosis not present

## 2021-02-27 ENCOUNTER — Telehealth: Payer: Self-pay | Admitting: Pharmacist

## 2021-02-27 NOTE — Progress Notes (Deleted)
Chronic Care Management Pharmacy Note  02/27/2021 Name:  [name] MRN:  [id number] DOB:  [birth year]  Summary: ***  Recommendations/Changes made from today's visit: ***  Plan: ***   Subjective: [name] is an 80 y.o. year old female who is a primary patient of [place], Timoteo Expose, MD.  The CCM team was consulted for assistance with disease management and care coordination needs.    Engaged with patient face to face for initial visit in response to provider referral for pharmacy case management and/or care coordination services.   Consent to Services:  The patient was given the following information about Chronic Care Management services today, agreed to services, and gave verbal consent: 1. CCM service includes personalized support from designated clinical staff supervised by the primary care provider, including individualized plan of care and coordination with other care providers 2. 24/7 contact phone numbers for assistance for urgent and routine care needs. 3. Service will only be billed when office clinical staff spend 20 minutes or more in a month to coordinate care. 4. Only one practitioner may furnish and bill the service in a calendar month. 5.The patient may stop CCM services at any time (effective at the end of the month) by phone call to the office staff. 6. The patient will be responsible for cost sharing (co-pay) of up to 20% of the service fee (after annual deductible is met). Patient agreed to services and consent obtained.  Patient Care Team: [place], Betty G, MD as PCP - General (Family Medicine) Levert Feinstein, MD as Consulting Physician (Neurology) Gean Birchwood, MD as Consulting Physician (Orthopedic Surgery) Dorisann Frames, MD as Consulting Physician (Endocrinology) Clance, Maree Krabbe, MD (Pulmonary Disease) Croitoru, Rachelle Hora, MD (Cardiology) Hilda Lias, MD (Neurosurgery) Verner Chol, [facility] as Pharmacist (Pharmacist)  Recent  office visits: 02/06/21 [name] MD: Patient presented for altered mental status.  11/02/2020 [name] MD - Patient was seen for Stage 3 chronic kidney disease and additional issues. No medications changes. Follow up in 4 months.   10/24/2020 [name] MD - Patient was seen for Allergic Rhinitis. No medication changes. Follow up if symptoms worsen or fail to improve.   08/18/2020 March Rummage LPN - Medicare annual wellness exam.  Recent consult visits: 12/26/2020 [name] (neurology) - Patient was seen for seizure and additional issues. Started Butalbital Apap Caffeine 50-325-40 q 6 hrs prn. Discontinued Tylenol, Prednisone and Spironolactone. Follow up in 1 year.   10/20/2020 [name] (podiatry) - Patient was seen for injury to right foot and additional issues. No medication changes.  Follow up if symptoms worsen or fail to improve.  Hospital visits: 02/10/2021 Patient was seen at [facility] ED for constipation.  Patient was discharged 02/10/2021 after 4 hours.  Patient was started on Polyethylene Glycol 17gms daily.   11/21/2020 Patient was seen at [facility].  Patient was seen for fatigue and additional issues. No medication changes. Follow up with primary care.   09/30/2020 Patient was seen at [facility].  Patient was seen for right foot pain and an additional issue. No medication changes. No follow up noted.   09/21/2020 Patient was seen at [facility].  Patient was seen for closed nondisplaced fracture of the proximal phalanx of lesser toe of right foot and additional issues. Started Prednisone 50mg  daily.  No follow up noted.   Objective:  Lab Results  Component Value Date   CREATININE 1.61 (H) 02/10/2021  BUN 17 02/10/2021   GFR 37.74 (L) 02/06/2021   GFRNONAA 32 (L) 02/10/2021   GFRAA 37 (L) 11/18/2019   NA 139 02/10/2021   K 4.5 02/10/2021   CALCIUM 9.1 02/10/2021   CO2 29 02/10/2021   GLUCOSE  89 02/10/2021    Lab Results  Component Value Date/Time   HGBA1C 5.9 02/06/2021 09:58 AM   HGBA1C 5.8 05/18/2020 02:52 PM   FRUCTOSAMINE 226 02/06/2021 09:58 AM   FRUCTOSAMINE 225 05/18/2020 02:52 PM   GFR 37.74 (L) 02/06/2021 09:58 AM   GFR 27.26 (L) 11/02/2020 02:59 PM   MICROALBUR <0.2 11/18/2019 10:08 AM    Last diabetic Eye exam:  Lab Results  Component Value Date/Time   HMDIABEYEEXA No Retinopathy 01/27/2020 07:03 AM    Last diabetic Foot exam: No results found for: HMDIABFOOTEX   Lab Results  Component Value Date   CHOL 135 07/13/2020   HDL 45.50 07/13/2020   LDLCALC 74 07/13/2020   LDLDIRECT 172.2 08/07/2010   TRIG 78.0 07/13/2020   CHOLHDL 3 07/13/2020    Hepatic Function Latest Ref Rng & Units 02/10/2021 11/21/2020 06/15/2020  Total Protein 6.5 - 8.1 g/dL 7.3 7.3 6.9  Albumin 3.5 - 5.0 g/dL 3.6 3.4(L) 3.7  AST 15 - 41 U/L 13(L) 17 12  ALT 0 - 44 U/L $Remo'13 16 10  'CPqtP$ Alk Phosphatase 38 - 126 U/L 92 95 83  Total Bilirubin 0.3 - 1.2 mg/dL 0.3 0.5 0.4  Bilirubin, Direct 0.1 - 0.5 mg/dL - - -    Lab Results  Component Value Date/Time   TSH 2.30 09/15/2019 10:44 AM   TSH 2.76 07/31/2016 03:40 PM   FREET4 0.91 01/24/2016 04:26 PM   FREET4 0.99 08/21/2014 11:48 AM    CBC Latest Ref Rng & Units 02/10/2021 02/06/2021 11/21/2020  WBC 4.0 - 10.5 K/uL 5.8 6.0 6.7  Hemoglobin 12.0 - 15.0 g/dL 13.3 13.9 14.0  Hematocrit 36.0 - 46.0 % 43.5 43.6 44.6  Platelets 150 - 400 K/uL 191 221.0 246    No results found for: VD25OH  Clinical ASCVD: Yes  The 10-year ASCVD risk score (Arnett DK, et al., 2019) is: 29%   Values used to calculate the score:     Age: 61 years     Sex: Female     Is Non-Hispanic African American: Yes     Diabetic: Yes     Tobacco smoker: No     Systolic Blood Pressure: 166 mmHg     Is BP treated: Yes     HDL Cholesterol: 45.5 mg/dL     Total Cholesterol: 135 mg/dL    Depression screen [facility] 2/9 02/06/2021 08/18/2020 08/18/2020  Decreased Interest 1 1 0   Down, Depressed, Hopeless 1 0 0  PHQ - 2 Score 2 1 0  Altered sleeping 2 - -  Tired, decreased energy 2 - -  Change in appetite 3 - -  Feeling bad or failure about yourself  2 - -  Trouble concentrating 3 - -  Moving slowly or fidgety/restless 3 - -  Suicidal thoughts 0 - -  PHQ-9 Score 17 - -  Difficult doing work/chores Somewhat difficult - -  Some recent data might be hidden     ***Other: (CHADS2VASc if Afib, MMRC or CAT for COPD, ACT, DEXA)  Social History   Tobacco Use  Smoking Status Never   Passive exposure: Yes  Smokeless Tobacco Never  Tobacco Comments   From Father.   BP Readings from Last 3 Encounters:  02/10/21 (!) 144/105  02/06/21 110/70  12/26/20 109/68   Pulse Readings from Last 3 Encounters:  02/10/21 62  02/06/21 93  12/26/20 67   Wt Readings from Last 3 Encounters:  02/10/21 188 lb 15 oz (85.7 kg)  02/06/21 189 lb (85.7 kg)  11/02/20 189 lb (85.7 kg)   BMI Readings from Last 3 Encounters:  02/10/21 33.47 kg/m  02/06/21 33.48 kg/m  11/02/20 33.48 kg/m    Assessment/Interventions: Review of patient past medical history, allergies, medications, health status, including review of consultants reports, laboratory and other test data, was performed as part of comprehensive evaluation and provision of chronic care management services.   SDOH:  (Social Determinants of Health) assessments and interventions performed: {yes/no:20286}  SDOH Screenings   Alcohol Screen: Low Risk    Last Alcohol Screening Score (AUDIT): 0  Depression (PHQ2-9): Medium Risk   PHQ-2 Score: 17  Financial Resource Strain: Low Risk    Difficulty of Paying Living Expenses: Not hard at all  Food Insecurity: No Food Insecurity   Worried About Charity fundraiser in the Last Year: Never true   Ran Out of Food in the Last Year: Never true  Housing: Low Risk    Last Housing Risk Score: 0  Physical Activity: Inactive   Days of Exercise per Week: 0 days   Minutes of  Exercise per Session: 0 min  Social Connections: Socially Isolated   Frequency of Communication with Friends and Family: Three times a week   Frequency of Social Gatherings with Friends and Family: Three times a week   Attends Religious Services: Never   Active Member of Clubs or Organizations: No   Attends Archivist Meetings: Never   Marital Status: Divorced  Stress: No Stress Concern Present   Feeling of Stress : Only a little  Tobacco Use: Medium Risk   Smoking Tobacco Use: Never   Smokeless Tobacco Use: Never   Passive Exposure: Yes  Transportation Needs: No Transportation Needs   Lack of Transportation (Medical): No   Lack of Transportation (Non-Medical): No   Patient diet is generally healthy, Breakfast is chicken sausage or [place] bacon with eggs, fresh apple and toast. Lunch is a protein shake. Dinner is a meat and veggetable with occasional rice.  Do you get any type of exercise on a regular basis? Yes Patient walks twice daily, stairs twice daily and stretching.  Patient would like to loose 10-20 lbs.  CCM Care Plan  Allergies  Allergen Reactions   Bee Pollen Other (See Comments)    Seasonal allergies   Pollen Extract Other (See Comments)    Seasonal allergies    Medications Reviewed Today     Reviewed by Mercer Pod, RN (Registered Nurse) on 02/10/21 at [place] List Status: <None>   Medication Order Taking? Sig Documenting Provider Last Dose Status Informant  albuterol (PROAIR HFA) 108 (90 Base) MCG/ACT inhaler [id number]  INHALE 2 PUFFS BY MOUTH EVERY 6 HOURS AS NEEDED FOR WHEEZING [place], Betty G, MD  Active   atorvastatin (LIPITOR) 40 MG tablet [id number]  TAKE 1 TABLET(40 MG) BY MOUTH DAILY AT 6 PM [place], Betty G, MD  Active   butalbital-acetaminophen-caffeine (FIORICET) 6030745437 MG tablet [id number]  Take 1 tablet by mouth every 6 (six) hours as needed for headache. [name], MD  Active   carvedilol (COREG) 25 MG tablet [id number]  Take  12.5 mg by mouth 2 (two) times daily. [provider]  Active   diclofenac Sodium (VOLTAREN)  1 % GEL [id number]  Apply 2 g topically 4 (four) times daily. Hans Eden, NP  Active   diphenhydrAMINE (BENYLIN) 12.5 MG/5ML syrup [id number]  Take 5 mLs (12.5 mg total) by mouth at bedtime. Hans Eden, NP  Active   DULoxetine (CYMBALTA) 60 MG capsule [id number]  Take 1 capsule (60 mg total) by mouth daily. [place], Betty G, MD  Active   ELIQUIS 5 MG TABS tablet [id number]  TAKE 1 TABLET(5 MG) BY MOUTH TWICE DAILY [place], Betty G, MD  Active   famotidine (PEPCID) 20 MG tablet [id number]  TAKE 1 TABLET(20 MG) BY MOUTH AT BEDTIME [place], Betty G, MD  Active   furosemide (LASIX) 40 MG tablet [id number]  Take 1 tablet (40 mg total) by mouth daily. [place], Betty G, MD  Active   hydrocortisone cream 1 % [id number]  Apply 1 application topically 2 (two) times daily. [place], Betty G, MD  Active   irbesartan (AVAPRO) 150 MG tablet [id number]  TAKE 1 TABLET(150 MG) BY MOUTH DAILY [place], Betty G, MD  Active   LamoTRIgine 200 MG TB24 24 hour tablet [id number]  Take 1 tablet (200 mg total) by mouth at bedtime. [name], MD  Active   memantine [facility]) 10 MG tablet [id number]  Take 1 tablet (10 mg total) by mouth 2 (two) times daily. Suzzanne Cloud, NP  Active   montelukast (SINGULAIR) 10 MG tablet [id number]  TAKE 1 TABLET(10 MG) BY MOUTH EVERY DAY IN THE EVENING [place], Betty G, MD  Active   Multiple Vitamin (MULTIVITAMIN) tablet [id number]  Take by mouth. [provider]  Active   nitroGLYCERIN (NITROSTAT) 0.4 MG SL tablet [id number]  Place 1 tablet (0.4 mg total) under the tongue every 5 (five) minutes as needed for chest pain. [place], Betty G, MD  Active   pantoprazole ([place]) 40 MG tablet [id number]  TAKE 1 TABLET(40 MG) BY MOUTH DAILY [place], Betty G, MD  Active             Patient Active Problem List   Diagnosis Date Noted   Nonintractable headache 12/26/2020   Atherosclerosis of  aorta ([place]) 11/21/2019   Type 2 diabetes mellitus with neurological complications ([place]) 03/25/4974   Insomnia 05/18/2019   Bilateral hand pain 02/28/2018   Muscular pain 11/15/2017   Fall 11/15/2017   Sprain of right ankle 11/15/2017   Cystocele, unspecified (CODE) 09/18/2017   Mild cognitive impairment 09/12/2017   Right flank pain 09/05/2017   Right lower quadrant abdominal pain 09/05/2017   Generalized osteoarthritis of multiple sites 01/08/2017   Renal angiomyolipoma, right 08/17/2016   Cough 07/17/2016   Genital herpes 01/24/2016   Prediabetes 01/24/2016   Chest pain    Coronary artery disease, occlusive: mid RCA CTO with L-R collaterals 06/08/2015   Chest wall pain 06/07/2015   Chronic anticoagulation-Eliquis 06/07/2015   CKD (chronic kidney disease), stage III ([place]) 06/07/2015   Abnormal nuclear stress test 06/07/2015   Cardiomyopathy, ischemic - suggested by Myoview 06/07/2015   Bilateral leg edema 05/31/2015   Nummular eczematous dermatitis 03/08/2015   History of pulmonary embolus (PE)-May 2016 08/20/2014   Benign essential tremor 08/20/2014   Intrinsic asthma 08/20/2014   Morbid obesity ([place]) 08/17/2014   Lumbar stenosis with neurogenic claudication 06/02/2014   Anxiety    Seizure ([place])    Essential hypertension 09/23/2012   Fibromyalgia    Cough variant asthma 09/03/2011   Osteopenia 12/19/2009   Graves' disease 10/25/2009   Depression 10/25/2009   Headache 10/25/2009  Constipation 08/15/2007   KNEE PAIN, BILATERAL 05/16/2007   Sleep difficulties 01/21/2007   Dyslipidemia, goal LDL below 70 05/30/2006   Allergic rhinitis 05/30/2006   GASTROESOPHAGEAL REFLUX, NO ESOPHAGITIS 05/30/2006    Immunization History  Administered Date(s) Administered   Fluad Quad(high Dose 65+) 01/25/2020   Influenza Whole 05/21/2007, 12/31/2007, 12/10/2010   Influenza, High Dose Seasonal PF 12/22/2012, 11/23/2016   Influenza-Unspecified 12/31/2011, 12/31/2013, 02/15/2015,  12/21/2015   PFIZER(Purple Top)SARS-COV-2 Vaccination 04/23/2019, 05/14/2019, 01/16/2020   Pneumococcal Conjugate-13 08/17/2014, 02/25/2018   Pneumococcal Polysaccharide-23 05/21/2007, 12/22/2012   Td 04/03/2003   Tdap 06/11/2019   Zoster, Live 06/30/2007    Conditions to be addressed/monitored:  Hypertension, Hyperlipidemia, Diabetes, Coronary Artery Disease, GERD, Asthma, Chronic Kidney Disease, Osteopenia, Allergic Rhinitis, and History of PE  There are no care plans that you recently modified to display for this patient.   Current Barriers:  {pharmacybarriers:24917}  Pharmacist Clinical Goal(s):  Patient will {PHARMACYGOALCHOICES:24921} through collaboration with PharmD and provider.   Interventions: 1:1 collaboration with [place], Betty G, MD regarding development and update of comprehensive plan of care as evidenced by provider attestation and co-signature Inter-disciplinary care team collaboration (see longitudinal plan of care) Comprehensive medication review performed; medication list updated in electronic medical record  BP Readings from Last 3 Encounters:  02/10/21 (!) 144/105  02/06/21 110/70  12/26/20 109/68     Hypertension (BP goal <140/90) -Not ideally controlled -Current treatment: Irbesartan 150 mg 1 tablet daily Carvedilol 25 mg 1/2 tablet twice daily -Medications previously tried: spironolactone  -Current home readings: *** -Current dietary habits: *** -Current exercise habits: *** -{ACTIONS;DENIES/REPORTS:21021675::"Denies"} hypotensive/hypertensive symptoms -Educated on {CCM BP Counseling:25124} -Counseled to monitor BP at home ***, document, and provide log at future appointments -{CCMPHARMDINTERVENTION:25122}  Hyperlipidemia: (LDL goal < 70) -{US controlled/uncontrolled:25276} -Current treatment: Atorvastatin 40 mg 1 tablet daily -Medications previously tried: ***  -Current dietary patterns: *** -Current exercise habits: *** -Educated on  {CCM HLD Counseling:25126} -{CCMPHARMDINTERVENTION:25122}  CAD (Goal: ***) -{US controlled/uncontrolled:25276} -Current treatment  Atorvastatin 40 mg 1 tablet daily Eliquis 5 mg 1 tablet twice daily Nitroglyercin 0.4 mg SL tablet as needed -Medications previously tried: ***  -{CCMPHARMDINTERVENTION:25122}  Lab Results  Component Value Date   HGBA1C 5.9 02/06/2021     Diabetes (A1c goal <7%) -Controlled -Current medications: No medications -Medications previously tried: ***  -Current home glucose readings fasting glucose: *** post prandial glucose: *** -{ACTIONS;DENIES/REPORTS:21021675::"Denies"} hypoglycemic/hyperglycemic symptoms -Current meal patterns:  breakfast: ***  lunch: ***  dinner: *** snacks: *** drinks: *** -Current exercise: *** -Educated on {CCM DM COUNSELING:25123} -Counseled to check feet daily and get yearly eye exams -{CCMPHARMDINTERVENTION:25122}  Asthma (Goal: control symptoms) -{US controlled/uncontrolled:25276} -Current treatment  Montelukast 10 mg 1 tablet daily Albuterol HFA as needed -Medications previously tried: ***  -Gold Grade: {CHL HP Upstream Pharm COPD Gold ZOXWR:6045409811} -Current COPD Classification:  {CHL HP Upstream Pharm COPD Classification:3343422275} -MMRC/CAT score: *** -Pulmonary function testing: *** -Exacerbations requiring treatment in last 6 months: *** -Patient {Actions; denies-reports:120008} consistent use of maintenance inhaler -Frequency of rescue inhaler use: *** -Counseled on {CCMINHALERCOUNSELING:25121} -{CCMPHARMDINTERVENTION:25122}  Depression/Anxiety (Goal: ***) -{US controlled/uncontrolled:25276} -Current treatment: Duloxetine 60 mg 1 capsule daily -Medications previously tried/failed: *** -PHQ9: *** -GAD7: *** -Connected with *** for mental health support -Educated on {CCM mental health counseling:25127} -{CCMPHARMDINTERVENTION:25122}  Osteopenia (Goal ***) -{US  controlled/uncontrolled:25276} -Last DEXA Scan: 08/12/19   T-Score femoral neck: -1.8  T-Score total hip: n/a  T-Score lumbar spine: -0.2  T-Score forearm radius: n/a  10-year probability of major osteoporotic fracture: 5.6%  10-year probability of hip fracture: 1.3% -Patient {is;is not an osteoporosis candidate:23886} -Current treatment  Calcium? Vitamin D? -Medications previously tried: ***  -{Osteoporosis Counseling:23892} -{CCMPHARMDINTERVENTION:25122}  Memory loss (Goal: ***) -{US controlled/uncontrolled:25276} -Current treatment  Memantine 10 mg 1 tablet twice daily -Medications previously tried: ***  -{CCMPHARMDINTERVENTION:25122}  GERD (Goal: ***) -{US controlled/uncontrolled:25276} -Current treatment  Famotidine 20 mg 1 tablet at bedtime Pantoprazole 40 mg 1 tablet daily -Medications previously tried: ***  -{CCMPHARMDINTERVENTION:25122}  History of PE (Goal: ***) -{US controlled/uncontrolled:25276} -Current treatment  Eliquis 5 mg 1 tablet twice daily -Medications previously tried: ***  -{CCMPHARMDINTERVENTION:25122} -cost?  Seizures (Goal: ***) -{US controlled/uncontrolled:25276} -Current treatment  Lamotrigine 200 mg 1 tablet at bedtime -Medications previously tried: ***  -{CCMPHARMDINTERVENTION:25122}   Health Maintenance -Vaccine gaps: Shingrix, influenza, COVID booster -Current therapy:  Multivitamin 1 tablet daily Hydrocortisone cream as needed Diclofenac gel 1% as needed Miralax as needed Fioricet as needed -Educated on {ccm supplement counseling:25128} -{CCM Patient satisfied:25129} -{CCMPHARMDINTERVENTION:25122}  Patient Goals/Self-Care Activities Patient will:  - {pharmacypatientgoals:24919}  Follow Up Plan: {CM FOLLOW UP XNAT:55732}   Medication Assistance: {MEDASSISTANCEINFO:25044}  Compliance/Adherence/Medication fill history: Care Gaps: Shingrix, COVID booster, influenza, eye exam, colonoscopy HGA1C - 5.9 on 02/06/2021 BP -  110/70 on 02/06/2021  Star-Rating Drugs: Atorvastatin  40 mg - last filled 12/16/2020 90DS at [facility] Irbesartan 150 mg - last filled 11/15/2020 90[facility] at [facility]  Patient's preferred pharmacy is:  [id number] - [place], [address] AT [address] [address] Phone: [phone] Fax: [phone]  Uses pill box? {Yes or If no, why not?:20788} Pt endorses ***% compliance  We discussed: {Pharmacy options:24294} Patient decided to: {US Pharmacy [facility]  Care Plan and Follow Up Patient Decision:  {FOLLOWUP:24991}  Plan: {CM FOLLOW UP GYIR:48546}  Jeni Salles, PharmD, [facility] Clinical Pharmacist {MP practice sites:26434} [phone]

## 2021-02-27 NOTE — Chronic Care Management (AMB) (Signed)
    Chronic Care Management Pharmacy Assistant   Name: [name]  MRN: [id number] DOB: Sep 16, 1940  02/28/2021 APPOINTMENT REMINDER   Called [name], No answer, left message of appointment on 02/28/2021 at 11:00 via office visit with Jeni Salles, Pharm D. Notified to have all medications, supplements, blood pressure and/or blood sugar logs available during appointment and to return call if need to reschedule.  Care Gaps: AWV - completed 08/18/2020 Shingrix - never done Covid vaccine booster 4 - overdue Flu - due HGA1C - 5.9 on 02/06/2021 BP - 110/70 on 02/06/2021 Eye exam - overdue Colonoscopy - overdue  Star Rating Drug: Atorvastatin  40 mg - last filled 12/16/2020 90DS at [facility] Irbesartan 150 mg - last filled 02/27/2021 90DS at [facility] verified with Jala.  Any gaps in medications fill history?  [place] Pharmacist Assistant [phone]

## 2021-02-28 ENCOUNTER — Ambulatory Visit: Payer: Medicare Other

## 2021-03-14 ENCOUNTER — Telehealth: Payer: Self-pay

## 2021-03-14 DIAGNOSIS — Z1211 Encounter for screening for malignant neoplasm of colon: Secondary | ICD-10-CM

## 2021-03-14 NOTE — Telephone Encounter (Signed)
Okay to order referral? 

## 2021-03-14 NOTE — Telephone Encounter (Signed)
Patient called asking for a referral for a colonoscopy

## 2021-03-15 NOTE — Telephone Encounter (Signed)
Referral placed, they will contact patient.

## 2021-03-15 NOTE — Addendum Note (Signed)
Addended by: Rodrigo Ran on: 03/15/2021 06:54 AM   Modules accepted: Orders

## 2021-03-20 ENCOUNTER — Telehealth: Payer: Self-pay | Admitting: Internal Medicine

## 2021-03-20 NOTE — Telephone Encounter (Signed)
Good afternoon Dr. Carlean Purl, patient called wanting to schedule an appointment.  She was previously one of your patients but transferred to [facility].  Patient would like to transfer back to [facility].  Please advise if ok to schedule.  Thank you.

## 2021-03-21 NOTE — Telephone Encounter (Signed)
She can have a next available NEW spot with me or an APP

## 2021-03-24 ENCOUNTER — Other Ambulatory Visit: Payer: Self-pay | Admitting: Family Medicine

## 2021-03-24 DIAGNOSIS — I10 Essential (primary) hypertension: Secondary | ICD-10-CM

## 2021-03-24 DIAGNOSIS — N1832 Chronic kidney disease, stage 3b: Secondary | ICD-10-CM

## 2021-03-28 NOTE — Telephone Encounter (Signed)
Patient scheduled for 04/14/2021 with an APP.

## 2021-04-10 DIAGNOSIS — N179 Acute kidney failure, unspecified: Secondary | ICD-10-CM | POA: Diagnosis not present

## 2021-04-10 DIAGNOSIS — I129 Hypertensive chronic kidney disease with stage 1 through stage 4 chronic kidney disease, or unspecified chronic kidney disease: Secondary | ICD-10-CM | POA: Diagnosis not present

## 2021-04-10 DIAGNOSIS — N183 Chronic kidney disease, stage 3 unspecified: Secondary | ICD-10-CM | POA: Diagnosis not present

## 2021-04-10 DIAGNOSIS — Z23 Encounter for immunization: Secondary | ICD-10-CM | POA: Diagnosis not present

## 2021-04-10 DIAGNOSIS — E1122 Type 2 diabetes mellitus with diabetic chronic kidney disease: Secondary | ICD-10-CM | POA: Diagnosis not present

## 2021-04-10 DIAGNOSIS — I5030 Unspecified diastolic (congestive) heart failure: Secondary | ICD-10-CM | POA: Diagnosis not present

## 2021-04-10 LAB — BASIC METABOLIC PANEL
BUN: 18 (ref 4–21)
CO2: 29 — AB (ref 13–22)
Chloride: 104 (ref 99–108)
Creatinine: 1.6 — AB (ref 0.5–1.1)
Glucose: 102
Potassium: 4.9 (ref 3.4–5.3)
Sodium: 141 (ref 137–147)

## 2021-04-10 LAB — COMPREHENSIVE METABOLIC PANEL
Albumin: 4 (ref 3.5–5.0)
Calcium: 10.1 (ref 8.7–10.7)
GFR calc non Af Amer: 34

## 2021-04-10 LAB — VITAMIN D 25 HYDROXY (VIT D DEFICIENCY, FRACTURES): Vit D, 25-Hydroxy: 42.2

## 2021-04-12 ENCOUNTER — Telehealth: Payer: Self-pay | Admitting: Neurology

## 2021-04-12 MED ORDER — LAMOTRIGINE ER 200 MG PO TB24: 200.0000 mg | ORAL_TABLET | Freq: Every day | ORAL | 4 refills | Status: DC

## 2021-04-12 NOTE — Telephone Encounter (Signed)
Pt requesting refill for LamoTRIgine 200 MG TB24 24 hour tablet. Pharmacy [facility] [place], [place]

## 2021-04-12 NOTE — Telephone Encounter (Signed)
Refill submitted. 

## 2021-04-14 ENCOUNTER — Encounter: Payer: Self-pay | Admitting: Nurse Practitioner

## 2021-04-14 ENCOUNTER — Ambulatory Visit (INDEPENDENT_AMBULATORY_CARE_PROVIDER_SITE_OTHER): Payer: Commercial Managed Care - HMO | Admitting: Nurse Practitioner

## 2021-04-14 VITALS — BP 126/68 | HR 70 | Ht 63.0 in | Wt 193.0 lb

## 2021-04-14 DIAGNOSIS — K5900 Constipation, unspecified: Secondary | ICD-10-CM
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
## 2021-04-14 NOTE — Patient Instructions (Addendum)
RECOMMENDATIONS: Miralax- Dissolve one capful in 8 ounces of water and drink once or twice a day as tolerated to soften stool. Milk of Magnesia 2 tablespoons every third night as needed. You will be contacted with Dr. Celesta Aver recommendation regarding a colonoscopy.  It was great seeing you today! Thank you for entrusting me with your care and choosing [facility]  Noralyn Pick, CRNP  The [facility] providers would like to encourage you to use [facility] to communicate with providers for non-urgent requests or questions.  Due to long hold times on the telephone, sending your provider a message by [facility] may be faster and more efficient way to get a response. Please allow 48 business hours for a response.  Please remember that this is for non-urgent requests/questions.  If you are age 89 or older, your body mass index should be between 23-30. Your Body mass index is 34.19 kg/m. If this is out of the aforementioned range listed, please consider follow up with your Primary Care Provider.  If you are age 47 or younger, your body mass index should be between 19-25. Your Body mass index is 34.19 kg/m. If this is out of the aformentioned range listed, please consider follow up with your Primary Care Provider.

## 2021-04-17 ENCOUNTER — Telehealth: Payer: Self-pay

## 2021-04-17 NOTE — Telephone Encounter (Signed)
-----   Message from Noralyn Pick, NP sent at 04/17/2021  8:03 AM EST -----    ----- Message ----- From: Gatha Mayer, MD Sent: 04/16/2021   8:11 AM EST To: Noralyn Pick, NP  I addended note - agree - no role for a colonoscopy here ----- Message ----- From: Noralyn Pick, NP Sent: 04/14/2021   1:01 PM EST To: Gatha Mayer, MD  Dr. Carlean Purl, she actually looks fairly well in person, however, I did not schedule her for a colonoscopy until you review.  Let me know if you recommend a colonoscopy or to defer any further colonoscopies due to age and multiple comorbidities.  Thank you

## 2021-04-17 NOTE — Telephone Encounter (Signed)
Called the patient to advise her. No answer. Left her this information in her voicemail.

## 2021-04-17 NOTE — Progress Notes (Signed)
Beth, pls contact patient and let her know Dr. Carlean Purl did not recommend any further colonoscopies due to her age and multiple health tissues. Pls schedule her  for a follow up appointment with Dr. Carlean Purl in 2 to 3 months if she continues to have constipation issues. Thx

## 2021-04-21 ENCOUNTER — Encounter: Payer: Self-pay | Admitting: Family Medicine

## 2021-04-26 DIAGNOSIS — I10 Essential (primary) hypertension: Secondary | ICD-10-CM | POA: Diagnosis not present

## 2021-04-26 DIAGNOSIS — I2699 Other pulmonary embolism without acute cor pulmonale: Secondary | ICD-10-CM | POA: Diagnosis not present

## 2021-04-26 DIAGNOSIS — I2510 Atherosclerotic heart disease of native coronary artery without angina pectoris: Secondary | ICD-10-CM | POA: Diagnosis not present

## 2021-04-26 DIAGNOSIS — E785 Hyperlipidemia, unspecified: Secondary | ICD-10-CM | POA: Diagnosis not present

## 2021-04-26 DIAGNOSIS — N189 Chronic kidney disease, unspecified: Secondary | ICD-10-CM | POA: Diagnosis not present

## 2021-04-28 ENCOUNTER — Other Ambulatory Visit: Payer: Self-pay | Admitting: Family Medicine

## 2021-04-28 DIAGNOSIS — M797 Fibromyalgia: Secondary | ICD-10-CM

## 2021-04-28 DIAGNOSIS — M48062 Spinal stenosis, lumbar region with neurogenic claudication: Secondary | ICD-10-CM

## 2021-05-22 ENCOUNTER — Encounter (HOSPITAL_COMMUNITY): Payer: Self-pay | Admitting: Emergency Medicine

## 2021-05-22 ENCOUNTER — Emergency Department (HOSPITAL_COMMUNITY): Payer: Medicare Other

## 2021-05-22 ENCOUNTER — Emergency Department (HOSPITAL_COMMUNITY)
Admission: EM | Admit: 2021-05-22 | Discharge: 2021-05-22 | Disposition: A | Discharge status: Home / Self Care | Payer: Medicare Other | Attending: Emergency Medicine | Admitting: Emergency Medicine

## 2021-05-22 DIAGNOSIS — Z7982 Long term (current) use of aspirin: Secondary | ICD-10-CM | POA: Diagnosis not present

## 2021-05-22 DIAGNOSIS — R0789 Other chest pain: Secondary | ICD-10-CM | POA: Diagnosis not present

## 2021-05-22 DIAGNOSIS — Z7901 Long term (current) use of anticoagulants: Secondary | ICD-10-CM | POA: Insufficient documentation

## 2021-05-22 DIAGNOSIS — R079 Chest pain, unspecified: Secondary | ICD-10-CM | POA: Diagnosis not present

## 2021-05-22 DIAGNOSIS — I2510 Atherosclerotic heart disease of native coronary artery without angina pectoris: Secondary | ICD-10-CM | POA: Insufficient documentation

## 2021-05-22 DIAGNOSIS — R600 Localized edema: Secondary | ICD-10-CM | POA: Insufficient documentation

## 2021-05-22 DIAGNOSIS — E119 Type 2 diabetes mellitus without complications: Secondary | ICD-10-CM | POA: Diagnosis not present

## 2021-05-22 LAB — BASIC METABOLIC PANEL
Anion gap: 9 (ref 5–15)
BUN: 20 mg/dL (ref 8–23)
CO2: 23 mmol/L (ref 22–32)
Calcium: 8.9 mg/dL (ref 8.9–10.3)
Chloride: 103 mmol/L (ref 98–111)
Creatinine, Ser: 1.59 mg/dL — ABNORMAL HIGH (ref 0.44–1.00)
GFR, Estimated: 33 mL/min — ABNORMAL LOW (ref >60)
Glucose, Bld: 149 mg/dL — ABNORMAL HIGH (ref 70–99)
Potassium: 3.9 mmol/L (ref 3.5–5.1)
Sodium: 135 mmol/L (ref 135–145)

## 2021-05-22 LAB — CBC
HCT: 41.4 % (ref 36.0–46.0)
Hemoglobin: 13.2 g/dL (ref 12.0–15.0)
MCH: 29.3 pg (ref 26.0–34.0)
MCHC: 31.9 g/dL (ref 30.0–36.0)
MCV: 92 fL (ref 80.0–100.0)
Platelets: 186 10*3/uL (ref 150–400)
RBC: 4.5 MIL/uL (ref 3.87–5.11)
RDW: 14.1 % (ref 11.5–15.5)
WBC: 6.3 10*3/uL (ref 4.0–10.5)
nRBC: 0 % (ref 0.0–0.2)

## 2021-05-22 LAB — TROPONIN I (HIGH SENSITIVITY)
Troponin I (High Sensitivity): 5 ng/L (ref <18)
Troponin I (High Sensitivity): 4 ng/L (ref <18)

## 2021-05-22 MED ORDER — ASPIRIN 325 MG PO TABS
325.0000 mg | ORAL_TABLET | Freq: Every day | ORAL | Status: DC
Start: 2021-05-22 — End: 2021-05-22
  Administered 2021-05-22: 325 mg via ORAL
  Filled 2021-05-22 (×2): qty 1

## 2021-05-22 MED ORDER — CYCLOBENZAPRINE HCL 10 MG PO TABS
10.0000 mg | ORAL_TABLET | Freq: Once | ORAL | Status: AC
Start: 2021-05-22 — End: 2021-05-22
  Administered 2021-05-22: 10 mg via ORAL
  Filled 2021-05-22: qty 1

## 2021-05-22 MED ORDER — CYCLOBENZAPRINE HCL 5 MG PO TABS: 5.0000 mg | ORAL_TABLET | Freq: Two times a day (BID) | ORAL | 0 refills | Status: AC | PRN

## 2021-05-22 NOTE — Discharge Instructions (Addendum)
You were evaluated in the Emergency Department and after careful evaluation, we did not find any emergent condition requiring admission or further testing in the hospital.  Your exam/testing today was overall reassuring.  Please follow-up your cardiologist  Please return to the Emergency Department if you experience any worsening of your condition.  Thank you for allowing [place] to be a part of your care.

## 2021-05-22 NOTE — ED Provider Notes (Addendum)
[facility] EMERGENCY DEPARTMENT Provider Note   CSN: [id number] Arrival date & time: 05/22/21  9326     History  Chief Complaint  Patient presents with   Chest Pain    [name] is a 81 y.o. female.  HPI  Patient with medical history including hyperlipidemia, diabetes, PE currently on Eliquis, cardiac catheterization back in 2017 with chronic RCA occlusion, recent stress test 2020 which was unremarkable. Presents with complaints of chest pain.  Patient chest pain started this morning, woke up out of her sleep, pain is on the left side of her chest, rates into her left arm, describes a pressure-like sensation, no associated shortness of breath, pleuritic chest pain, did not become diaphoretic, nausea vomiting lightheadedness, not worsened with exertion, states pain has improved since coming to the hospital started to come back.  Denies any illicit drug use or smoking, no alcohol use, she states she has been compliant with her medications.  She denies p.o. intake or movement improves or decrease the pain, has no complaints this time.  Son endorse fevers chills stomach pains nausea vomit diarrhea worsening peripheral edema.  Reviewed medical chart cardiac catheterization back in 2017 with chronic  100 %RCA occlusion, mild occlusion at the LAD.  Recent stress test 2020 which was unremarkable  Home Medications Prior to Admission medications   Medication Sig Start Date End Date Taking? Authorizing Provider  atorvastatin (LIPITOR) 40 MG tablet TAKE 1 TABLET(40 MG) BY MOUTH DAILY AT 6 PM Patient taking differently: Take 40 mg by mouth every evening. 12/16/20  Yes Martinique, Betty G, MD  butalbital-acetaminophen-caffeine [facility]) [phone] MG tablet Take 1 tablet by mouth every 6 (six) hours as needed for headache. 12/26/20  Yes [name], MD  carvedilol (COREG) 25 MG tablet Take 25 mg by mouth 2 (two) times daily. 07/22/19  Yes [provider]   cyclobenzaprine (FLEXERIL) 5 MG tablet Take 1 tablet (5 mg total) by mouth 2 (two) times daily as needed for up to 10 days for muscle spasms. 05/22/21 06/01/21 Yes Marcello Fennel, PA-C  diclofenac Sodium (VOLTAREN) 1 % GEL Apply 2 g topically 4 (four) times daily. 06/02/20  Yes White, Adrienne R, NP  DULoxetine (CYMBALTA) 60 MG capsule TAKE 1 CAPSULE(60 MG) BY MOUTH DAILY Patient taking differently: Take 60 mg by mouth daily. 05/01/21  Yes Martinique, Betty G, MD  ELIQUIS 5 MG TABS tablet TAKE 1 TABLET(5 MG) BY MOUTH TWICE DAILY Patient taking differently: Take 5 mg by mouth 2 (two) times daily. 12/16/20  Yes Martinique, Betty G, MD  famotidine (PEPCID) 20 MG tablet TAKE 1 TABLET(20 MG) BY MOUTH AT BEDTIME Patient taking differently: Take 20 mg by mouth at bedtime. 12/20/20  Yes Martinique, Betty G, MD  furosemide (LASIX) 40 MG tablet TAKE 1 TABLET(40 MG) BY MOUTH DAILY Patient taking differently: Take 40 mg by mouth daily. 05/01/21  Yes Martinique, Betty G, MD  irbesartan (AVAPRO) 150 MG tablet TAKE 1 TABLET(150 MG) BY MOUTH DAILY Patient taking differently: Take 150 mg by mouth daily. 03/28/21  Yes Martinique, Betty G, MD  LamoTRIgine 200 MG TB24 24 hour tablet Take 1 tablet (200 mg total) by mouth at bedtime. 04/12/21  Yes [name], MD  memantine (NAMENDA) 10 MG tablet Take 1 tablet (10 mg total) by mouth 2 (two) times daily. 09/22/20  Yes Suzzanne Cloud, NP  montelukast (SINGULAIR) 10 MG tablet TAKE 1 TABLET(10 MG) BY MOUTH EVERY DAY IN THE EVENING Patient taking differently: Take 10  mg by mouth at bedtime. 12/16/20  Yes Martinique, Betty G, MD  nitroGLYCERIN (NITROSTAT) 0.4 MG SL tablet Place 1 tablet (0.4 mg total) under the tongue every 5 (five) minutes as needed for chest pain. 12/22/20  Yes Martinique, Betty G, MD  pantoprazole (PROTONIX) 40 MG tablet TAKE 1 TABLET(40 MG) BY MOUTH DAILY Patient taking differently: Take 40 mg by mouth daily. 05/30/20  Yes Martinique, Betty G, MD  albuterol [facility] HFA) [phone] Base) MCG/ACT  inhaler INHALE 2 PUFFS BY MOUTH EVERY 6 HOURS AS NEEDED FOR WHEEZING Patient not taking: Reported on 05/22/2021 06/12/19   Martinique, Betty G, MD  hydrocortisone cream 1 % Apply 1 application topically 2 (two) times daily. Patient not taking: Reported on 05/22/2021 02/10/21   Martinique, Betty G, MD      Allergies    Bee pollen and Pollen extract    Review of Systems   Review of Systems  Constitutional:  Negative for chills and fever.  Respiratory:  Negative for shortness of breath.   Cardiovascular:  Positive for chest pain. Negative for palpitations.  Gastrointestinal:  Negative for abdominal pain, diarrhea and vomiting.  Neurological:  Negative for headaches.   Physical Exam Updated Vital Signs BP (!) 142/77    Pulse 63    Temp 97.6 F (36.4 C) (Oral)    Resp 14    SpO2 96%  Physical Exam Vitals and nursing note reviewed.  Constitutional:      General: She is not in acute distress.    Appearance: She is not ill-appearing.  HENT:     Head: Normocephalic and atraumatic.     Nose: No congestion.  Eyes:     Conjunctiva/sclera: Conjunctivae normal.  Cardiovascular:     Rate and Rhythm: Normal rate and regular rhythm.     Pulses: Normal pulses.     Heart sounds: No murmur heard.   No friction rub. No gallop.  Pulmonary:     Effort: No respiratory distress.     Breath sounds: No wheezing, rhonchi or rales.     Comments: Patient is focalized chest pain on the left upper chest, it is reproducible, no overlying skin changes, no crepitus or deformities noted.  Chest:     Chest wall: Tenderness present.  Abdominal:     Palpations: Abdomen is soft.     Tenderness: There is no abdominal tenderness. There is no right CVA tenderness or left CVA tenderness.  Musculoskeletal:     Right lower leg: Edema present.     Left lower leg: Edema present.     Comments: 2+ edema noted in the bilateral lower extremities, no chronic skin changes present, neurovascular fully intact.  Good dorsal pedal  pulses bilaterally.  Skin:    General: Skin is warm and dry.  Neurological:     Mental Status: She is alert.  Psychiatric:        Mood and Affect: Mood normal.    ED Results / Procedures / Treatments   Labs (all labs ordered are listed, but only abnormal results are displayed) Labs Reviewed  BASIC METABOLIC PANEL - Abnormal; Notable for the following components:      Result Value   Glucose, Bld 149 (*)    Creatinine, Ser 1.59 (*)    GFR, Estimated 33 (*)    All other components within normal limits  CBC  TROPONIN I (HIGH SENSITIVITY)  TROPONIN I (HIGH SENSITIVITY)    EKG EKG Interpretation  Date/Time:  Monday May 22 2021 09:22:23 EST  Ventricular Rate:  75 PR Interval:  212 QRS Duration: 83 QT Interval:  390 QTC Calculation: 436 R Axis:   23 Text Interpretation: Sinus rhythm Borderline prolonged PR interval Low voltage, precordial leads Confirmed by Regan Lemming ([id number]) on 05/22/2021 9:57:38 AM  Radiology DG Chest 2 View  Result Date: 05/22/2021 CLINICAL DATA:  Chest pain EXAM: CHEST - 2 VIEW COMPARISON:  October 2021 FINDINGS: The heart size and mediastinal contours are within normal limits. Both lungs are clear. No pleural effusion or pneumothorax. No acute osseous abnormality. IMPRESSION: No acute process in the chest. Electronically Signed   By: Macy Mis M.D.   On: 05/22/2021 09:38    Procedures Procedures    Medications Ordered in ED Medications  aspirin tablet 325 mg (325 mg Oral Given 05/22/21 0953)  cyclobenzaprine (FLEXERIL) tablet 10 mg (10 mg Oral Given 05/22/21 1010)    ED Course/ Medical Decision Making/ A&P   HEAR Score: 5                       Medical Decision Making Amount and/or Complexity of Data Reviewed Labs: ordered. Radiology: ordered.  Risk OTC drugs. Prescription drug management.   This patient presents to the ED for concern of chest pain, this involves an extensive number of treatment options, and is a complaint that  carries with it a high risk of complications and morbidity.  The differential diagnosis includes ACS, PE, dissection, pneumonia    Additional history obtained:  Additional history obtained from electronic medical record External records from outside source obtained and reviewed including please see HPI for further detail   Co morbidities that complicate the patient evaluation  Diabetes, hyperlipidemia, PE, CAD  Social Determinants of Health:  Age    Lab Tests:  I Ordered, and personally interpreted labs.  The pertinent results include: CBC unremarkable   Imaging Studies ordered:  I ordered imaging studies including chest x-ray I independently visualized and interpreted imaging which showed unremarkable for acute findings I agree with the radiologist interpretation   Cardiac Monitoring:  The patient was maintained on a cardiac monitor.  I personally viewed and interpreted the cardiac monitored which showed an underlying rhythm of: EKG sinus without signs of ischemia   Medicines ordered and prescription drug management:  I ordered medication including aspirin, Flexeril for chest pain I have reviewed the patients home medicines and have made adjustments as needed    Reevaluation:  On arrival patient presented with chest pain, she provided aspirin as well as Flexeril, patient was reassessed and was found resting comfortably, vital signs have remained stable, states that her pain has improved significantly, will continue to monitor.  Updated lab work and imaging found resting comfortably, vital signs reassuring patient is agreeable for discharge   Rule out I have low suspicion for ACS as history is atypical, EKG was sinus rhythm without signs of ischemia, negative delta troponin patient does have a slightly elevated heart score of 5 but she has no pain at this time,  pain is well focalized, as well as reproducible exam is more consistent with muscular strain.  Low  suspicion for PE as patient denies pleuritic chest pain, shortness of breath, patient denies leg pain, no pedal edema noted on exam, vital signs reassuring nontachypneic, nonhypoxic, nontachycardic.  Low suspicion for AAA or aortic dissection as history is atypical, patient has low risk factors.  Low suspicion for systemic infection as patient is nontoxic-appearing, vital signs reassuring, no obvious source infection  noted on exam.     Dispostion and problem list  Chest pain since resolved-likely muscular in nature will provide her with a muscle relaxer, due to her slightly elevated heart score will have her follow-up with her cardiologist for further evaluation.           Final Clinical Impression(s) / ED Diagnoses Final diagnoses:  Atypical chest pain    Rx / DC Orders ED Discharge Orders          Ordered    cyclobenzaprine (FLEXERIL) 5 MG tablet  2 times daily PRN        05/22/21 1153              Marcello Fennel, PA-C 05/22/21 1154    Marcello Fennel, PA-C 05/22/21 1307    Regan Lemming, MD 05/22/21 1517

## 2021-05-22 NOTE — ED Triage Notes (Signed)
Patient here after with complaint of chest pressure that woke her this morning around 0700. Patient denies nausea, vomiting, and dizziness. Reports chest pressure radiates into left arm. Patient alert, oriented, speaking in complete sentences, and in no apparent distress at this time.

## 2021-05-24 ENCOUNTER — Telehealth: Payer: Self-pay | Admitting: Pharmacist

## 2021-05-24 NOTE — Chronic Care Management (AMB) (Signed)
° ° °  Chronic Care Management Pharmacy Assistant   Name: Norely Schlick  MRN: [id number] DOB: [birth year]  Reason for Encounter: Reschedule initial visit.   Rescheduled with patient to 07/04/2021.   [place] Pharmacist Assistant [phone]

## 2021-06-06 ENCOUNTER — Encounter: Payer: Self-pay | Admitting: Family Medicine

## 2021-06-06 ENCOUNTER — Telehealth (INDEPENDENT_AMBULATORY_CARE_PROVIDER_SITE_OTHER): Payer: Medicare Other | Admitting: Family Medicine

## 2021-06-06 DIAGNOSIS — R059 Cough, unspecified: Secondary | ICD-10-CM | POA: Diagnosis not present

## 2021-06-06 DIAGNOSIS — R0981 Nasal congestion: Secondary | ICD-10-CM

## 2021-06-06 MED ORDER — BENZONATATE 100 MG PO CAPS: 100.0000 mg | ORAL_CAPSULE | Freq: Three times a day (TID) | ORAL | 0 refills | Status: DC | PRN
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
## 2021-06-06 NOTE — Patient Instructions (Signed)
?  HOME CARE TIPS: ? ?-COVID19 testing information: ?ForwardDrop.tn ? ?Most pharmacies also offer testing and home test kits. If the Covid19 test is positive and you desire antiviral treatment, please contact a [facility] or schedule a follow up virtual visit through your primary care office or through the [facility].  ?Other test to treat options: ?ConnectRV.is?click_source=alert ? ?-I sent the medication(s) we discussed to your pharmacy: ?Meds ordered this encounter  ?Medications  ? benzonatate (TESSALON PERLES) 100 MG capsule  ?  Sig: Take 1 capsule (100 mg total) by mouth 3 (three) times daily as needed.  ?  Dispense:  30 capsule  ?  Refill:  0  ?  ? ?-can use tylenol  if needed for fevers, aches and pains per instructions ? ?-can use nasal saline a few times per day if you have nasal congestion ? ?-stay hydrated, drink plenty of fluids and eat small healthy meals - avoid dairy ? ?-can take 1000 IU (32mg) Vit D3 and 100-500 mg of Vit C daily per instructions ? ?-If the Covid test is positive, [facility] for more information on home care, transmission and treatment for [place]? ?-follow up with your doctor in 2-3 days unless improving and feeling better ? ?-stay home while sick, except to seek medical care. If you have COVID19, you will likely be contagious for 7-10 days. Flu or Influenza is likely contagious for about 7 days. Other respiratory viral infections remain contagious for 5-10+ days depending on the virus and many other factors. Wear a good mask that fits snugly (such as N95 or KN95) if around others to reduce the risk of transmission. ? ?It was nice to meet you today, and I really hope you are feeling better soon. I help [facility] out with telemedicine visits on Tuesdays and Thursdays and am happy to help if you need a follow up virtual visit on those days. Otherwise, if you have any concerns or questions following  this visit please schedule a follow up visit with your Primary Care doctor or seek care at a local urgent care clinic to avoid delays in care.  ? ? ?Seek in person care or schedule a follow up video visit promptly if your symptoms worsen, new concerns arise or you are not improving with treatment. Call 911 and/or seek emergency care if your symptoms are severe or life threatening. ? ? ?

## 2021-06-10 ENCOUNTER — Emergency Department (HOSPITAL_COMMUNITY)
Admission: EM | Admit: 2021-06-10 | Discharge: 2021-06-10 | Disposition: A | Discharge status: Home / Self Care | Payer: Medicare Other | Attending: Emergency Medicine | Admitting: Emergency Medicine

## 2021-06-10 ENCOUNTER — Emergency Department (HOSPITAL_COMMUNITY): Payer: Medicare Other

## 2021-06-10 ENCOUNTER — Other Ambulatory Visit: Payer: Self-pay

## 2021-06-10 ENCOUNTER — Encounter (HOSPITAL_COMMUNITY): Payer: Self-pay | Admitting: Emergency Medicine

## 2021-06-10 DIAGNOSIS — R600 Localized edema: Secondary | ICD-10-CM | POA: Diagnosis not present

## 2021-06-10 DIAGNOSIS — R0789 Other chest pain: Secondary | ICD-10-CM | POA: Diagnosis not present

## 2021-06-10 DIAGNOSIS — R079 Chest pain, unspecified: Secondary | ICD-10-CM | POA: Diagnosis not present

## 2021-06-10 DIAGNOSIS — Z7901 Long term (current) use of anticoagulants: Secondary | ICD-10-CM | POA: Diagnosis not present

## 2021-06-10 DIAGNOSIS — M549 Dorsalgia, unspecified: Secondary | ICD-10-CM | POA: Diagnosis not present

## 2021-06-10 DIAGNOSIS — I2510 Atherosclerotic heart disease of native coronary artery without angina pectoris: Secondary | ICD-10-CM | POA: Diagnosis not present

## 2021-06-10 LAB — CBC
HCT: 41.6 % (ref 36.0–46.0)
Hemoglobin: 13.1 g/dL (ref 12.0–15.0)
MCH: 28.9 pg (ref 26.0–34.0)
MCHC: 31.5 g/dL (ref 30.0–36.0)
MCV: 91.8 fL (ref 80.0–100.0)
Platelets: 163 10*3/uL (ref 150–400)
RBC: 4.53 MIL/uL (ref 3.87–5.11)
RDW: 13.7 % (ref 11.5–15.5)
WBC: 6.4 10*3/uL (ref 4.0–10.5)
nRBC: 0 % (ref 0.0–0.2)

## 2021-06-10 LAB — BASIC METABOLIC PANEL
Anion gap: 9 (ref 5–15)
BUN: 19 mg/dL (ref 8–23)
CO2: 25 mmol/L (ref 22–32)
Calcium: 9.1 mg/dL (ref 8.9–10.3)
Chloride: 103 mmol/L (ref 98–111)
Creatinine, Ser: 1.51 mg/dL — ABNORMAL HIGH (ref 0.44–1.00)
GFR, Estimated: 35 mL/min — ABNORMAL LOW (ref >60)
Glucose, Bld: 106 mg/dL — ABNORMAL HIGH (ref 70–99)
Potassium: 4.1 mmol/L (ref 3.5–5.1)
Sodium: 137 mmol/L (ref 135–145)

## 2021-06-10 LAB — TROPONIN I (HIGH SENSITIVITY)
Troponin I (High Sensitivity): 4 ng/L (ref <18)
Troponin I (High Sensitivity): 4 ng/L (ref <18)

## 2021-06-10 MED ORDER — IOHEXOL 350 MG/ML SOLN
75.0000 mL | Freq: Once | INTRAVENOUS | Status: AC | PRN
  Administered 2021-06-10: 75 mL via INTRAVENOUS

## 2021-06-10 MED ORDER — METHOCARBAMOL 500 MG PO TABS: 500.0000 mg | ORAL_TABLET | Freq: Three times a day (TID) | ORAL | 0 refills | Status: DC | PRN

## 2021-06-10 NOTE — ED Triage Notes (Signed)
Pt reports L sided upper chest pain, L shoulder pain, L neck pain, and pain to L side of head x 2 weeks.  Seen in ED on 2/20 for chest pressure.  Also reports mild SOB and dizziness.  Denies nausea and vomiting. ?

## 2021-06-10 NOTE — ED Provider Notes (Signed)
[facility] EMERGENCY DEPARTMENT Provider Note   CSN: [id number] Arrival date & time: 06/10/21  6606     History  Chief Complaint  Patient presents with   Chest Pain    [name] is a 81 y.o. female.   Chest Pain Associated symptoms: back pain   Associated symptoms: no shortness of breath   Patient presents with chest pain.  Has had for a few weeks now.  Been seen in the ER for same and diagnosed with likely muscular pain.  Been started on muscle relaxer which helped.  States she finished that up and now pain is back.  Anterior chest.  Not exertional.  States has not been able to sleep due to the pain.  Not exertional but not changed with eating also.  States goes to her neck into her arm.  Does have a history of coronary artery disease and sees Dr. Terrence Dupont.  Reviewing records appears of had a stress test around 2 years ago that was negative.  No difficulty breathing.  Is on blood thinners for previous pulmonary embolism.    Home Medications Prior to Admission medications   Medication Sig Start Date End Date Taking? Authorizing Provider  methocarbamol (ROBAXIN) 500 MG tablet Take 1 tablet (500 mg total) by mouth every 8 (eight) hours as needed for muscle spasms. 06/10/21  Yes Davonna Belling, MD  albuterol [facility] HFA) 108 (90 Base) MCG/ACT inhaler INHALE 2 PUFFS BY MOUTH EVERY 6 HOURS AS NEEDED FOR WHEEZING 06/12/19   [place], Betty G, MD  atorvastatin (LIPITOR) 40 MG tablet TAKE 1 TABLET(40 MG) BY MOUTH DAILY AT 6 PM Patient taking differently: Take 40 mg by mouth every evening. 12/16/20   [place], Betty G, MD  benzonatate (TESSALON PERLES) 100 MG capsule Take 1 capsule (100 mg total) by mouth 3 (three) times daily as needed. 06/06/21   Lucretia Kern, DO  butalbital-acetaminophen-caffeine (FIORICET) [phone] MG tablet Take 1 tablet by mouth every 6 (six) hours as needed for headache. 12/26/20   [name], MD  carvedilol (COREG) 25 MG tablet Take 25 mg  by mouth 2 (two) times daily. 07/22/19   [provider]  diclofenac Sodium (VOLTAREN) 1 % GEL Apply 2 g topically 4 (four) times daily. 06/02/20   White, Leitha Schuller, NP  DULoxetine (CYMBALTA) 60 MG capsule TAKE 1 CAPSULE(60 MG) BY MOUTH DAILY Patient taking differently: Take 60 mg by mouth daily. 05/01/21   [place], Betty G, MD  ELIQUIS 5 MG TABS tablet TAKE 1 TABLET(5 MG) BY MOUTH TWICE DAILY Patient taking differently: Take 5 mg by mouth 2 (two) times daily. 12/16/20   [place], Betty G, MD  famotidine (PEPCID) 20 MG tablet TAKE 1 TABLET(20 MG) BY MOUTH AT BEDTIME Patient taking differently: Take 20 mg by mouth at bedtime. 12/20/20   [place], Betty G, MD  furosemide (LASIX) 40 MG tablet TAKE 1 TABLET(40 MG) BY MOUTH DAILY Patient taking differently: Take 40 mg by mouth daily. 05/01/21   [place], Betty G, MD  hydrocortisone cream 1 % Apply 1 application topically 2 (two) times daily. 02/10/21   [place], Betty G, MD  irbesartan (AVAPRO) 150 MG tablet TAKE 1 TABLET(150 MG) BY MOUTH DAILY Patient taking differently: Take 150 mg by mouth daily. 03/28/21   [place], Betty G, MD  LamoTRIgine 200 MG TB24 24 hour tablet Take 1 tablet (200 mg total) by mouth at bedtime. 04/12/21   [name], MD  memantine (NAMENDA) 10 MG tablet Take 1 tablet (10  mg total) by mouth 2 (two) times daily. 09/22/20   Suzzanne Cloud, NP  montelukast (SINGULAIR) 10 MG tablet TAKE 1 TABLET(10 MG) BY MOUTH EVERY DAY IN THE EVENING Patient taking differently: Take 10 mg by mouth at bedtime. 12/16/20   [place], Betty G, MD  nitroGLYCERIN (NITROSTAT) 0.4 MG SL tablet Place 1 tablet (0.4 mg total) under the tongue every 5 (five) minutes as needed for chest pain. 12/22/20   [place], Betty G, MD  pantoprazole (PROTONIX) 40 MG tablet TAKE 1 TABLET(40 MG) BY MOUTH DAILY Patient taking differently: Take 40 mg by mouth daily. 05/30/20   [place], Betty G, MD      Allergies    Bee pollen and Pollen extract    Review of Systems   Review of Systems   Constitutional:  Negative for appetite change.  Respiratory:  Negative for shortness of breath.   Cardiovascular:  Positive for chest pain.  Genitourinary:  Negative for vaginal pain.  Musculoskeletal:  Positive for back pain.  Neurological:  Negative for tremors.   Physical Exam Updated Vital Signs BP 121/84 (BP Location: Right Arm)    Pulse 70    Temp 97.6 F (36.4 C) (Oral)    Resp 20    SpO2 100%  Physical Exam Vitals and nursing note reviewed.  Cardiovascular:     Rate and Rhythm: Regular rhythm.  Chest:     Chest wall: No tenderness.     Comments: Mild tenderness left upper chest wall.  No crepitance.  No rash.  No deformity. Abdominal:     Tenderness: There is no abdominal tenderness.  Musculoskeletal:     Right lower leg: Edema present.     Left lower leg: Edema present.  Skin:    General: Skin is warm.  Neurological:     Mental Status: She is alert.    ED Results / Procedures / Treatments   Labs (all labs ordered are listed, but only abnormal results are displayed) Labs Reviewed  BASIC METABOLIC PANEL - Abnormal; Notable for the following components:      Result Value   Glucose, Bld 106 (*)    Creatinine, Ser 1.51 (*)    GFR, Estimated 35 (*)    All other components within normal limits  CBC  TROPONIN I (HIGH SENSITIVITY)  TROPONIN I (HIGH SENSITIVITY)    EKG None  Radiology DG Chest 2 View  Result Date: 06/10/2021 CLINICAL DATA:  Chest pain and shoulder pain EXAM: CHEST - 2 VIEW COMPARISON:  05/22/2021 FINDINGS: Midline trachea. Normal heart size and mediastinal contours. No pleural effusion or pneumothorax. Clear lungs. IMPRESSION: No active cardiopulmonary disease. Electronically Signed   By: Abigail Miyamoto M.D.   On: 06/10/2021 11:14   CT Angio Chest Aorta W and/or Wo Contrast  Result Date: 06/10/2021 CLINICAL DATA:  Chest pain, nonspecific EXAM: CT ANGIOGRAPHY CHEST WITH CONTRAST TECHNIQUE: Multidetector CT imaging of the chest was performed using  the standard protocol during bolus administration of intravenous contrast. Multiplanar CT image reconstructions and MIPs were obtained to evaluate the vascular anatomy. RADIATION DOSE REDUCTION: This exam was performed according to the departmental dose-optimization program which includes automated exposure control, adjustment of the mA and/or kV according to patient size and/or use of iterative reconstruction technique. CONTRAST:  69m OMNIPAQUE IOHEXOL 350 MG/ML SOLN COMPARISON:  Same day chest x-ray.  CT 06/22/2015 FINDINGS: Cardiovascular: Preferential opacification of the thoracic aorta. No evidence of thoracic aortic aneurysm or dissection. Minimal atherosclerotic calcification of the aorta and coronary  arteries. Central pulmonary vasculature appears within normal limits. Normal heart size. No pericardial effusion. Mediastinum/Nodes: No enlarged mediastinal, hilar, or axillary lymph nodes. Thyroid gland, trachea, and esophagus demonstrate no significant findings. Lungs/Pleura: Stable benign subpleural nodules at the right middle lobe and left lower lobe (series 6, images 57 and 62). Lungs are clear. No pleural effusion or pneumothorax. Upper Abdomen: No acute abnormality. Musculoskeletal: No chest wall abnormality. No acute or significant osseous findings. Review of the MIP images confirms the above findings. IMPRESSION: No acute findings within the chest. Specifically, no evidence of thoracic aortic aneurysm or dissection. Electronically Signed   By: Davina Poke D.O.   On: 06/10/2021 13:17    Procedures Procedures    Medications Ordered in ED Medications  iohexol (OMNIPAQUE) 350 MG/ML injection 75 mL (75 mLs Intravenous Contrast Given 06/10/21 1257)    ED Course/ Medical Decision Making/ A&P                           Medical Decision Making Amount and/or Complexity of Data Reviewed Labs: ordered. Radiology: ordered.  Risk Prescription drug management.   Patient [name] with upper  chest pain.  Has had over the last couple weeks.  Goes to the neck and at times head.  Also down the arm.  Cardiac history.  Reviewed previous stress test and cardiac cath.  Stress test 2 years ago was negative.  Recently seen in the ER for same and reassuring.  Negative troponin then and now.  CT angiography done due to rule out of dissection with pain that goes back.  Independently interpreted and showed no dissection.  Pain has been somewhat reproducible also.  Muscle relaxer has worked in the past.  Will restart that.  Doubt acute coronary ischemia as the cause.  Appears stable for discharge home.  No rash so shingles felt less likely.  Discharge home.  Outpatient follow-up with PCP and cardiology as needed.        Final Clinical Impression(s) / ED Diagnoses Final diagnoses:  Nonspecific chest pain    Rx / DC Orders ED Discharge Orders          Ordered    methocarbamol (ROBAXIN) 500 MG tablet  Every 8 hours PRN        06/10/21 1425              Davonna Belling, MD 06/10/21 2057

## 2021-06-10 NOTE — ED Notes (Signed)
Patient returned from imaging, requested to use restroom. Patient ambulated to restroom independently. ?

## 2021-06-11 ENCOUNTER — Telehealth: Payer: Self-pay

## 2021-06-11 NOTE — Telephone Encounter (Signed)
Medication never made it to [facility] with e script.  Will call in medication ordered as written when pharmacist available at 2p ?

## 2021-06-11 NOTE — Progress Notes (Addendum)
Over phone waited 15 minutes for pharmacy, they then took their meal break until 2pm ?

## 2021-06-13 ENCOUNTER — Other Ambulatory Visit: Payer: Self-pay | Admitting: Family Medicine
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
## 2021-06-21 ENCOUNTER — Ambulatory Visit (INDEPENDENT_AMBULATORY_CARE_PROVIDER_SITE_OTHER): Payer: Medicare Other | Admitting: Family Medicine

## 2021-06-21 ENCOUNTER — Encounter: Payer: Self-pay | Admitting: Family Medicine

## 2021-06-21 VITALS — BP 140/80 | HR 94 | Resp 16 | Ht 63.0 in | Wt 200.5 lb

## 2021-06-21 DIAGNOSIS — R079 Chest pain, unspecified: Secondary | ICD-10-CM

## 2021-06-21 DIAGNOSIS — E1149 Type 2 diabetes mellitus with other diabetic neurological complication: Secondary | ICD-10-CM

## 2021-06-21 DIAGNOSIS — N1832 Chronic kidney disease, stage 3b: Secondary | ICD-10-CM | POA: Diagnosis not present

## 2021-06-21 DIAGNOSIS — R569 Unspecified convulsions: Secondary | ICD-10-CM

## 2021-06-21 DIAGNOSIS — I700 Atherosclerosis of aorta: Secondary | ICD-10-CM

## 2021-06-21 DIAGNOSIS — M797 Fibromyalgia: Secondary | ICD-10-CM | POA: Diagnosis not present

## 2021-06-21 DIAGNOSIS — I10 Essential (primary) hypertension: Secondary | ICD-10-CM | POA: Diagnosis not present

## 2021-06-21 MED ORDER — HYDROCODONE-ACETAMINOPHEN 5-325 MG PO TABS: 0.5000 | ORAL_TABLET | Freq: Every evening | ORAL | 0 refills | Status: DC | PRN

## 2021-06-21 MED ORDER — IRBESARTAN 150 MG PO TABS: 75.0000 mg | ORAL_TABLET | Freq: Every day | ORAL | 1 refills | Status: DC

## 2021-06-21 NOTE — Assessment & Plan Note (Signed)
Problem has been well controlled. ?Recommend continuing nonpharmacologic treatment. ?Continue annual eye exam and appropriate foot care. ?We will plan on checking A1c next visit. ?

## 2021-06-21 NOTE — Assessment & Plan Note (Signed)
It has been seen on imaging in the past. ?Continue atorvastatin 40 mg daily. ?

## 2021-06-21 NOTE — Assessment & Plan Note (Signed)
We discussed diagnosis and prognosis. ?This problem could be contributing to her CP. ?Continue duloxetine 60 mg daily. ?Low impact exercise and a good sleep hygiene. ?

## 2021-06-21 NOTE — Assessment & Plan Note (Signed)
Problem has been well controlled. Following with neurologist.  

## 2021-06-21 NOTE — Patient Instructions (Addendum)
A few things to remember from today's visit: ? ?Atherosclerosis of aorta ([place]), Chronic ? ?Type 2 diabetes mellitus with neurological complications ([place]), Chronic ? ?Seizure ([place]), Chronic ? ?Stage 3b chronic kidney disease ([place]) - Plan: irbesartan (AVAPRO) 150 MG tablet ? ?Essential hypertension - Plan: irbesartan (AVAPRO) 150 MG tablet ? ?If you need refills please call your pharmacy. ?Do not use My Chart to request refills or for acute issues that need immediate attention. ?  ?Carvedilol to increased from 1/2 tab to 1 tab 2 times daily. ?No changes in rest. ?Keep appt with cardiologist. ?We are going to try Hydrocodone-acetaminophen 1/2 to 1 tab at bedtime. ?No changes in Duloxetine. ?Continue low impact exercise. ?Fall precautions. ? ?Please be sure medication list is accurate. ?If a new problem present, please set up appointment sooner than planned today. ? ? ? ? ? ? ? ?

## 2021-06-21 NOTE — Assessment & Plan Note (Signed)
Home BP readings have been mildly elevated. ?Recommend increasing carvedilol dose from 12.5 mg twice daily to 25 mg twice daily. ?Continue Avapro 150 mg 1/2 tablet daily. ?Continue low-salt diet and monitoring BP regularly. ? ?

## 2021-06-21 NOTE — Assessment & Plan Note (Addendum)
Cr 1.5-1.6 for the past 2 months and e GFR 32-33. ?Continue Avapro 150 mg 1/2 tablet daily, adequate hydration, and low-salt diet. ?Following with nephrologist,last seen on 04/10/21. ?

## 2021-06-21 NOTE — Assessment & Plan Note (Signed)
We reviewed some of her dietary habits in the past 48 hours. ?I do not recommend pharmacologic treatment for weight loss due to the risk of side effects. ?Recommend decreasing intake of crackers and peanut butter and to portion nuts. ?Continue plenty of green vegetables. ?Decrease portion size of chicken and continue limiting red meat intake. ?

## 2021-07-03 ENCOUNTER — Telehealth: Payer: Self-pay | Admitting: Pharmacist

## 2021-07-03 NOTE — Chronic Care Management (AMB) (Signed)
? ? ?Chronic Care Management ?Pharmacy Assistant  ? ?Name: [name]  MRN: [id number] DOB: 08-10-1940 ? ?[name] is an 81 y.o. year old female who presents for her initial CCM visit with the clinical pharmacist. ?  ?Reason for Encounter: Chart prep for initial visit with Jeni Salles the clinical pharmacist on 07/04/2021 ?  ?Conditions to be addressed/monitored: ?HTN, DMII, CKD Stage 3, GERD, and Osteopenia ? ?Recent office visits:  ?06/21/2021 [name] MD - Patient was seen for atherosclerosis of aorta and additional issues. Started Norco 5/325 mg 1/2 to 1 tablet at bedtime as needed. Discontinued Benzonatate and Fioricet. Follow up in 4 weeks.  ? ?06/06/2021 Colin Benton DO - Patient was seen for cough and an additional issue. Started Benzonatate 100 mg 3 times daily prn. Follow up with your doctor in 2-3 days unless improving and feeling better. ? ?11/02/2020 [name] MD - Patient was seen for Stage 3 chronic kidney disease and additional issues. No medications changes. Follow up in 4 months. ?  ?10/24/2020 [name] MD - Patient was seen for Allergic Rhinitis. No medication changes. Follow up if symptoms worsen or fail to improve. ?  ?08/18/2020 [name] LPN - Medicare annual wellness exam. ? ?Recent consult visits:  ?04/14/2021 Carl Best NP (GI) - Patient was seen for constipation. Discontinued Miralax. No follow up noted. ? ?12/26/2020 [name] MD (neurology) - Patient was seen for seizure and additional issues. Started Butalbital Apap Caffeine 50-325-40 q 6 hrs prn. Discontinued Tylenol, Prednisone and Spironolactone. Follow up in 1 year. ?  ?10/20/2020 [name] DPM (podiatry) - Patient was seen for injury to right foot and additional issues. No medication changes.  Follow up if symptoms worsen or fail to improve. ? ?Hospital visits:  ?Patient was seen at [facility] ED on 06/10/2021 (4 hours) due to nonspecific chest  pain. ?New?Medications Started at [facility] Discharge:?? ?methocarbamol 500 MG tablet ?Medication Changes at Hospital Discharge: ?No medication changes  ?Medications Discontinued at Hospital Discharge: ?No medications discontinued ?Medications that remain the same after Hospital Discharge:??  ?-All other medications will remain the same.   ? ? ? Patient was seen at [facility] ED on 05/22/2021 ( 4 hours)  due to atypical chest pain.  ?New?Medications Started at [facility] Discharge:?? ?cyclobenzaprine 5 MG tablet ?Medication Changes at Hospital Discharge: ?No medication changes  ?Medications Discontinued at Hospital Discharge: ?No medications discontinued ?Medications that remain the same after Hospital Discharge:??  ?-All other medications will remain the same.  ? ?02/10/2021 Patient was seen at [facility] ED for constipation.  ?Patient was discharged 02/10/2021 after 4 hours.  Patient was started on Polyethylene Glycol 17gms daily. ?  ?11/21/2020 Patient was seen at [facility].  Patient was seen for fatigue and additional issues. No medication changes. Follow up with primary care. ?  ?09/30/2020 Patient was seen at [facility].  Patient was seen for right foot pain and an additional issue. No medication changes. No follow up noted. ?  ?09/21/2020 Patient was seen at [facility].  Patient was seen for closed nondisplaced fracture of the proximal phalanx of lesser toe of right foot and additional issues. Started Prednisone '50mg'$  daily.  ?No follow up noted. ? ?Medications: ?Outpatient Encounter Medications as of 07/03/2021  ?Medication Sig Note  ? albuterol (PROAIR HFA) 108 (90 Base) MCG/ACT inhaler INHALE 2 PUFFS BY MOUTH EVERY 6 HOURS AS NEEDED FOR WHEEZING   ? atorvastatin (  LIPITOR) 40 MG tablet TAKE 1 TABLET(40 MG) BY MOUTH DAILY AT 6 PM   ? carvedilol (COREG) 25 MG tablet Take 25 mg by mouth 2 (two) times daily.   ? diclofenac Sodium (VOLTAREN) 1 %  GEL Apply 2 g topically 4 (four) times daily.   ? DULoxetine (CYMBALTA) 60 MG capsule TAKE 1 CAPSULE(60 MG) BY MOUTH DAILY (Patient taking differently: Take 60 mg by mouth daily.)   ? ELIQUIS 5 MG TABS tablet TAKE 1 TABLET(5 MG) BY MOUTH TWICE DAILY   ? famotidine (PEPCID) 20 MG tablet TAKE 1 TABLET(20 MG) BY MOUTH AT BEDTIME (Patient taking differently: Take 20 mg by mouth at bedtime.)   ? furosemide (LASIX) 40 MG tablet TAKE 1 TABLET(40 MG) BY MOUTH DAILY (Patient taking differently: Take 40 mg by mouth daily.)   ? HYDROcodone-acetaminophen (NORCO/VICODIN) 5-325 MG tablet Take 0.5-1 tablets by mouth at bedtime as needed for moderate pain.   ? hydrocortisone cream 1 % Apply 1 application topically 2 (two) times daily.   ? irbesartan (AVAPRO) 150 MG tablet Take 0.5 tablets (75 mg total) by mouth daily.   ? LamoTRIgine 200 MG TB24 24 hour tablet Take 1 tablet (200 mg total) by mouth at bedtime.   ? memantine (NAMENDA) 10 MG tablet Take 1 tablet (10 mg total) by mouth 2 (two) times daily.   ? methocarbamol (ROBAXIN) 500 MG tablet Take 1 tablet (500 mg total) by mouth every 8 (eight) hours as needed for muscle spasms.   ? montelukast (SINGULAIR) 10 MG tablet TAKE 1 TABLET(10 MG) BY MOUTH EVERY DAY IN THE EVENING   ? nitroGLYCERIN (NITROSTAT) 0.4 MG SL tablet Place 1 tablet (0.4 mg total) under the tongue every 5 (five) minutes as needed for chest pain. 05/22/2021: Pt had 1 dose pta 2.20.23  ? pantoprazole (PROTONIX) 40 MG tablet TAKE 1 TABLET(40 MG) BY MOUTH DAILY   ? ?No facility-administered encounter medications on file as of 07/03/2021.  ?Fill History: ?ALBUTEROL HFA INH (200 PUFFS)8.5GM 06/12/2019 75  ? ?ATORVASTATIN CALCIUM  40 MG TABS 06/13/2021 90  ? ?CARVEDILOL  25 MG TABS 06/13/2021 90  ? ?DULOXETINE DR '60MG'$  CAPSULES 05/01/2021 90  ? ?DICLOFENAC 1% GEL 100GM 06/02/2020 7  ? ?FAMOTIDINE '20MG'$  TABLETS 03/24/2021 90  ? ?FUROSEMIDE '40MG'$  TABLETS 05/01/2021 90  ? ?IRBESARTAN '150MG'$  TABLETS 02/28/2021 90  ? ?MEMANTINE  '10MG'$  TABLETS 03/24/2021 90  ? ?MONTELUKAST SODIUM  10 MG TABS 06/13/2021 90  ? ?PANTOPRAZOLE SODIUM  40 MG TBEC 06/13/2021 90  ? ?LAMOTRIGINE ER '200MG'$  TABLETS 04/12/2021 90  ? ?Have you seen any other providers since your last visit?   ?Any changes in your medications or health?   ?Any side effects from any medications?   ?Do you have an symptoms or problems not managed by your medications?  ?Any concerns about your health right now?  ?Has your provider asked that you check blood pressure, blood sugar, or follow special diet at home?  ?Do you get any type of exercise on a regular basis?   ?Can you think of a goal you would like to reach for your health?   ?Do you have any problems getting your medications?  ?Is there anything that you would like to discuss during the appointment?  ?  ?LM on home phone reminding patient of in office visit 07/04/2021 at 9:00 with Jeni Salles, clinical pharmacist, notified to bring medications, supplements, blood pressure and blood sugar logs to appointment ? ?AWV - completed 08/18/2020 ?Last BP - 140/80  on 06/21/2021 ?Last A1C - 5.9 on 02/06/2021 ?Shingrix - never done ?Colonoscopy - overdue ?  ?Star Rating Drug: ?Atorvastatin  40 mg - last filled 06/13/2021 90DS at [facility] ?Irbesartan 150 mg - last filled 03/03/2021 90DS at [facility] verified with Ronalee Belts ? ?Gennie Alma CMA  ?Clinical Pharmacist Assistant ?[phone] ? ?

## 2021-07-03 NOTE — Chronic Care Management (AMB) (Signed)
A user error has taken place: encounter opened in error, closed for administrative reasons.

## 2021-07-03 NOTE — Progress Notes (Incomplete)
? ?Chronic Care Management ?Pharmacy Note ? ?07/03/2021 ?Name:  [name] MRN:  [id number] DOB:  01/10/1941 ? ?Summary: ?*** ? ?Recommendations/Changes made from today's visit: ?*** ? ?Plan: ?*** ? ? ?Subjective: ?[name] is an 81 y.o. year old female who is a primary patient of Martinique, Malka So, MD.  The CCM team was consulted for assistance with disease management and care coordination needs.   ? ?Engaged with patient by telephone for initial visit in response to provider referral for pharmacy case management and/or care coordination services.  ? ?Consent to Services:  ?The patient was given the following information about Chronic Care Management services today, agreed to services, and gave verbal consent: 1. CCM service includes personalized support from designated clinical staff supervised by the primary care provider, including individualized plan of care and coordination with other care providers 2. 24/7 contact phone numbers for assistance for urgent and routine care needs. 3. Service will only be billed when office clinical staff spend 20 minutes or more in a month to coordinate care. 4. Only one practitioner may furnish and bill the service in a calendar month. 5.The patient may stop CCM services at any time (effective at the end of the month) by phone call to the office staff. 6. The patient will be responsible for cost sharing (co-pay) of up to 20% of the service fee (after annual deductible is met). Patient agreed to services and consent obtained. ? ?Patient Care Team: ?Martinique, Betty G, MD as PCP - General (Family Medicine) ?[name], MD as Consulting Physician (Neurology) ?Frederik Pear, MD as Consulting Physician (Orthopedic Surgery) ?Jacelyn Pi, MD as Consulting Physician (Endocrinology) ?Kathee Delton, MD (Pulmonary Disease) ?Croitoru, Mihai, MD (Cardiology) ?Leeroy Cha, MD (Neurosurgery) ?Viona Gilmore, [facility] as Pharmacist (Pharmacist) ? ?Recent office  visits: ?06/21/21 Martinique, Betty G, MD - Patient presented for Atherosclerosis of aorta and other concerns. Prescribed Hydrocodone-Acetaminophen. Decreased Irbesartan. Stopped Benzonatate. Stopped Butalbital- APAP ?  ?06/06/21 Lucretia Kern, DO - Patient presented for cough and other concerns. Prescribed Benzonatate. ? ?Recent consult visits: ?04/14/21 Noralyn Pick, NP (Gastroenterology) - Patient presented for Constipation unspecified. Stopped Polyethylene Glycol. ? ?12/26/2020 [name] MD (neurology) - Patient was seen for seizure and additional issues. Started Butalbital Apap Caffeine 50-325-40 q 6 hrs prn. Discontinued Tylenol, Prednisone and Spironolactone. Follow up in 1 year. ?  ?10/20/2020 [name] DPM (podiatry) - Patient was seen for injury to right foot and additional issues. No medication changes.  Follow up if symptoms worsen or fail to improve. ? ?Hospital visits: ?Medication Reconciliation was completed by comparing discharge summary, patient?s EMR and Pharmacy list, and upon discussion with patient. ?  ?Patient presented to [facility] ED on 06/10/21 due to Chest Pain. Patient was present for 4 hours. ?  ?New?Medications Started at [facility] Discharge:?? ?-started  ?Methocarbamol ?  ?Medication Changes at Hospital Discharge: ?-Changed  ?None ?  ?Medications Discontinued at Hospital Discharge: ?-Stopped  ?none ?  ?Medications that remain the same after Hospital Discharge:??  ?-All other medications will remain the same.   ?  ?Medication Reconciliation was completed by comparing discharge summary, patient?s EMR and Pharmacy list, and upon discussion with patient. ?  ?Patient presented to [facility] ED on 05/22/21 due to Atypical Chest Pain. Patient was present for 4 hours. ?  ?New?Medications Started at [facility] Discharge:?? ?-started  ?Cyclobenzaprine ?  ?Medication Changes at Hospital Discharge: ?-Changed  ?None ?  ?Medications Discontinued at Hospital  Discharge: ?-  Stopped  ?none ?  ?Medications that remain the same after Hospital Discharge:??  ?-All other medications will remain the same. ? ?Patient was seen at [facility] ED for constipation.  ?Patient was discharged 02/10/2021 after 4 hours.  Patient was started on Polyethylene Glycol 17gms daily. ? ?Objective: ? ?Lab Results  ?Component Value Date  ? CREATININE 1.51 (H) 06/10/2021  ? BUN 19 06/10/2021  ? GFR 37.74 (L) 02/06/2021  ? GFRNONAA 35 (L) 06/10/2021  ? GFRAA 37 (L) 11/18/2019  ? NA 137 06/10/2021  ? K 4.1 06/10/2021  ? CALCIUM 9.1 06/10/2021  ? CO2 25 06/10/2021  ? GLUCOSE 106 (H) 06/10/2021  ? ? ?Lab Results  ?Component Value Date/Time  ? HGBA1C 5.9 02/06/2021 09:58 AM  ? HGBA1C 5.8 05/18/2020 02:52 PM  ? FRUCTOSAMINE 226 02/06/2021 09:58 AM  ? FRUCTOSAMINE 225 05/18/2020 02:52 PM  ? GFR 37.74 (L) 02/06/2021 09:58 AM  ? GFR 27.26 (L) 11/02/2020 02:59 PM  ? MICROALBUR <0.2 11/18/2019 10:08 AM  ?  ?Last diabetic Eye exam:  ?Lab Results  ?Component Value Date/Time  ? HMDIABEYEEXA No Retinopathy 01/27/2020 07:03 AM  ?  ?Last diabetic Foot exam: No results found for: HMDIABFOOTEX  ? ?Lab Results  ?Component Value Date  ? CHOL 135 07/13/2020  ? HDL 45.50 07/13/2020  ? [place] 74 07/13/2020  ? LDLDIRECT 172.2 08/07/2010  ? TRIG 78.0 07/13/2020  ? CHOLHDL 3 07/13/2020  ? ? ? ?  Latest Ref Rng & Units 04/10/2021  ? 12:00 AM 02/10/2021  ?  6:30 PM 11/21/2020  ?  2:10 PM  ?Hepatic Function  ?Total Protein 6.5 - 8.1 g/dL  7.3   7.3    ?Albumin 3.5 - 5.0 4.0      3.6   3.4    ?AST 15 - 41 U/L  13   17    ?ALT 0 - 44 U/L  13   16    ?Alk Phosphatase 38 - 126 U/L  92   95    ?Total Bilirubin 0.3 - 1.2 mg/dL  0.3   0.5    ?  ? This result is from an external source.  ? ? ?Lab Results  ?Component Value Date/Time  ? TSH 2.30 09/15/2019 10:44 AM  ? TSH 2.76 07/31/2016 03:40 PM  ? FREET4 0.91 01/24/2016 04:26 PM  ? FREET4 0.99 08/21/2014 11:48 AM  ? ? ? ?  Latest Ref Rng & Units 06/10/2021  ? 10:35 AM 05/22/2021   ?  9:15 AM 02/10/2021  ?  6:30 PM  ?CBC  ?WBC 4.0 - 10.5 K/uL 6.4   6.3   5.8    ?Hemoglobin 12.0 - 15.0 g/dL 13.1   13.2   13.3    ?Hematocrit 36.0 - 46.0 % 41.6   41.4   43.5    ?Platelets 150 - 400 K/uL 163   186   191    ? ? ?Lab Results  ?Component Value Date/Time  ? VD25OH 42.2 04/10/2021 12:00 AM  ? ? ?Clinical ASCVD: {YES/NO:21197} ?The 10-year ASCVD risk score (Arnett DK, et al., 2019) is: 28.2% ?  Values used to calculate the score: ?    Age: 89 years ?    Sex: Female ?    Is Non-Hispanic African American: Yes ?    Diabetic: Yes ?    Tobacco smoker: No ?    Systolic Blood Pressure: 992 mmHg ?    Is BP treated: Yes ?    HDL Cholesterol:  45.5 mg/dL ?    Total Cholesterol: 135 mg/dL   ? ? ?  06/21/2021  ?  8:03 AM 02/06/2021  ? 12:59 PM 08/18/2020  ?  3:07 PM  ?Depression screen PHQ 2/9  ?Decreased Interest 3 1 1   ?Down, Depressed, Hopeless 1 1 0  ?PHQ - 2 Score 4 2 1   ?Altered sleeping 1 2   ?Tired, decreased energy 2 2   ?Change in appetite 3 3   ?Feeling bad or failure about yourself  2 2   ?Trouble concentrating 3 3   ?Moving slowly or fidgety/restless 2 3   ?Suicidal thoughts 0 0   ?PHQ-9 Score 17 17   ?Difficult doing work/chores Not difficult at all Somewhat difficult   ?  ? ?***Other: (CHADS2VASc if Afib, MMRC or CAT for COPD, ACT, DEXA) ? ?Social History  ? ?Tobacco Use  ?Smoking Status Never  ? Passive exposure: Yes  ?Smokeless Tobacco Never  ?Tobacco Comments  ? From Father.  ? ?BP Readings from Last 3 Encounters:  ?06/21/21 140/80  ?06/10/21 121/84  ?05/22/21 (!) 142/77  ? ?Pulse Readings from Last 3 Encounters:  ?06/21/21 94  ?06/10/21 70  ?05/22/21 63  ? ?Wt Readings from Last 3 Encounters:  ?06/21/21 200 lb 8 oz (90.9 kg)  ?04/14/21 193 lb (87.5 kg)  ?02/10/21 188 lb 15 oz (85.7 kg)  ? ?BMI Readings from Last 3 Encounters:  ?06/21/21 35.52 kg/m?  ?04/14/21 34.19 kg/m?  ?02/10/21 33.47 kg/m?  ? ? ?Assessment/Interventions: Review of patient past medical history, allergies, medications, health  status, including review of consultants reports, laboratory and other test data, was performed as part of comprehensive evaluation and provision of chronic care management services.  ? ?SDOH:  (Social Determ

## 2021-07-04 ENCOUNTER — Ambulatory Visit: Payer: Medicare Other

## 2021-07-10 NOTE — Progress Notes (Signed)
Chronic Care Management Pharmacy Note  07/26/2021 Name:  [name] MRN:  [id number] DOB:  12-22-1940  Summary: BP not at goal < 130/80 per home readings  Recommendations/Changes made from today's visit: -Recommend repeat lipid panel -Recommended bringing BP cuff to next office visit to ensure accuracy -Recommended calcium citrate 600 mg and 1000 units of vitamin D daily -Recommended stopping montelukast given lack of benefit -Recommend repeat DEXA  Plan: Scheduled CPE BP assessment in 1 month   Subjective: [name] is an 81 y.o. year old female who is a primary patient of [place], Timoteo Expose, MD.  The CCM team was consulted for assistance with disease management and care coordination needs.    Engaged with patient by telephone for initial visit in response to provider referral for pharmacy case management and/or care coordination services.   Consent to Services:  The patient was given the following information about Chronic Care Management services today, agreed to services, and gave verbal consent: 1. CCM service includes personalized support from designated clinical staff supervised by the primary care provider, including individualized plan of care and coordination with other care providers 2. 24/7 contact phone numbers for assistance for urgent and routine care needs. 3. Service will only be billed when office clinical staff spend 20 minutes or more in a month to coordinate care. 4. Only one practitioner may furnish and bill the service in a calendar month. 5.The patient may stop CCM services at any time (effective at the end of the month) by phone call to the office staff. 6. The patient will be responsible for cost sharing (co-pay) of up to 20% of the service fee (after annual deductible is met). Patient agreed to services and consent obtained.  Patient Care Team: [place], Betty G, MD as PCP - General (Family Medicine) Levert Feinstein, MD as Consulting  Physician (Neurology) Gean Birchwood, MD as Consulting Physician (Orthopedic Surgery) Dorisann Frames, MD as Consulting Physician (Endocrinology) Clance, Maree Krabbe, MD (Pulmonary Disease) Croitoru, Rachelle Hora, MD (Cardiology) Hilda Lias, MD (Neurosurgery) Verner Chol, [facility] as Pharmacist (Pharmacist)  Recent office visits: 06/21/21 [place], Betty G, MD - Patient presented for Atherosclerosis of aorta and other concerns. Prescribed Hydrocodone-Acetaminophen. Decreased Irbesartan. Stopped Benzonatate. Stopped Butalbital- APAP   06/06/21 [name], DO - Patient presented for cough and other concerns. Prescribed Benzonatate.  Recent consult visits: 04/14/21 [name], NP (Gastroenterology) - Patient presented for Constipation unspecified. Stopped Polyethylene Glycol.  12/26/2020 [name] (neurology) - Patient was seen for seizure and additional issues. Started Butalbital Apap Caffeine 50-325-40 q 6 hrs prn. Discontinued Tylenol, Prednisone and Spironolactone. Follow up in 1 year.   10/20/2020 [name] (podiatry) - Patient was seen for injury to right foot and additional issues. No medication changes.  Follow up if symptoms worsen or fail to improve.  Hospital visits: Medication Reconciliation was completed by comparing discharge summary, patient's EMR and Pharmacy list, and upon discussion with patient.   Patient presented to [facility] ED on 06/10/21 due to Chest Pain. Patient was present for 4 hours.   New?Medications Started at [facility] Discharge:?? -started  Methocarbamol   Medication Changes at Hospital Discharge: -Changed  None   Medications Discontinued at Hospital Discharge: -Stopped  none   Medications that remain the same after Hospital Discharge:??  -All other medications will remain the same.     Medication Reconciliation was completed by comparing discharge summary, patient's EMR and Pharmacy list, and upon discussion with  patient.  Patient presented to [facility] ED on 05/22/21 due to Atypical Chest Pain. Patient was present for 4 hours.   New?Medications Started at [facility] Discharge:?? -started  Cyclobenzaprine   Medication Changes at Hospital Discharge: -Changed  None   Medications Discontinued at Hospital Discharge: -Stopped  none   Medications that remain the same after Hospital Discharge:??  -All other medications will remain the same.  Patient was seen at [facility] ED for constipation.  Patient was discharged 02/10/2021 after 4 hours.  Patient was started on Polyethylene Glycol 17gms daily.  Objective:  Lab Results  Component Value Date   CREATININE 1.51 (H) 06/10/2021   BUN 19 06/10/2021   GFR 37.74 (L) 02/06/2021   GFRNONAA 35 (L) 06/10/2021   GFRAA 37 (L) 11/18/2019   NA 137 06/10/2021   K 4.1 06/10/2021   CALCIUM 9.1 06/10/2021   CO2 25 06/10/2021   GLUCOSE 106 (H) 06/10/2021    Lab Results  Component Value Date/Time   HGBA1C 5.9 02/06/2021 09:58 AM   HGBA1C 5.8 05/18/2020 02:52 PM   FRUCTOSAMINE 226 02/06/2021 09:58 AM   FRUCTOSAMINE 225 05/18/2020 02:52 PM   GFR 37.74 (L) 02/06/2021 09:58 AM   GFR 27.26 (L) 11/02/2020 02:59 PM   MICROALBUR <0.2 11/18/2019 10:08 AM    Last diabetic Eye exam:  Lab Results  Component Value Date/Time   HMDIABEYEEXA No Retinopathy 01/27/2020 07:03 AM    Last diabetic Foot exam: No results found for: HMDIABFOOTEX   Lab Results  Component Value Date   CHOL 135 07/13/2020   HDL 45.50 07/13/2020   LDLCALC 74 07/13/2020   LDLDIRECT 172.2 08/07/2010   TRIG 78.0 07/13/2020   CHOLHDL 3 07/13/2020       Latest Ref Rng & Units 04/10/2021   12:00 AM 02/10/2021    6:30 PM 11/21/2020    2:10 PM  Hepatic Function  Total Protein 6.5 - 8.1 g/dL  7.3   7.3    Albumin 3.5 - 5.0 4.0      3.6   3.4    AST 15 - 41 U/L  13   17    ALT 0 - 44 U/L  13   16    Alk Phosphatase 38 - 126 U/L  92   95    Total  Bilirubin 0.3 - 1.2 mg/dL  0.3   0.5       This result is from an external source.    Lab Results  Component Value Date/Time   TSH 2.30 09/15/2019 10:44 AM   TSH 2.76 07/31/2016 03:40 PM   FREET4 0.91 01/24/2016 04:26 PM   FREET4 0.99 08/21/2014 11:48 AM       Latest Ref Rng & Units 06/10/2021   10:35 AM 05/22/2021    9:15 AM 02/10/2021    6:30 PM  CBC  WBC 4.0 - 10.5 K/uL 6.4   6.3   5.8    Hemoglobin 12.0 - 15.0 g/dL 13.1   13.2   13.3    Hematocrit 36.0 - 46.0 % 41.6   41.4   43.5    Platelets 150 - 400 K/uL 163   186   191      Lab Results  Component Value Date/Time   VD25OH 42.2 04/10/2021 12:00 AM    Clinical ASCVD: Yes  The 10-year ASCVD risk score (Arnett DK, et al., 2019) is: 26.2%   Values used to calculate the score:     Age: 89 years  Sex: Female     Is Non-Hispanic African American: Yes     Diabetic: Yes     Tobacco smoker: No     Systolic Blood Pressure: 836 mmHg     Is BP treated: Yes     HDL Cholesterol: 45.5 mg/dL     Total Cholesterol: 135 mg/dL       06/21/2021    8:03 AM 02/06/2021   12:59 PM 08/18/2020    3:07 PM  Depression screen PHQ 2/9  Decreased Interest 3 1 1   Down, Depressed, Hopeless 1 1 0  PHQ - 2 Score 4 2 1   Altered sleeping 1 2   Tired, decreased energy 2 2   Change in appetite 3 3   Feeling bad or failure about yourself  2 2   Trouble concentrating 3 3   Moving slowly or fidgety/restless 2 3   Suicidal thoughts 0 0   PHQ-9 Score 17 17   Difficult doing work/chores Not difficult at all Somewhat difficult        Social History   Tobacco Use  Smoking Status Never   Passive exposure: Yes  Smokeless Tobacco Never  Tobacco Comments   From Father.   BP Readings from Last 3 Encounters:  07/14/21 130/78  06/21/21 140/80  06/10/21 121/84   Pulse Readings from Last 3 Encounters:  06/21/21 94  06/10/21 70  05/22/21 63   Wt Readings from Last 3 Encounters:  06/21/21 200 lb 8 oz (90.9 kg)  04/14/21 193 lb (87.5  kg)  02/10/21 188 lb 15 oz (85.7 kg)   BMI Readings from Last 3 Encounters:  06/21/21 35.52 kg/m  04/14/21 34.19 kg/m  02/10/21 33.47 kg/m    Assessment/Interventions: Review of patient past medical history, allergies, medications, health status, including review of consultants reports, laboratory and other test data, was performed as part of comprehensive evaluation and provision of chronic care management services.   SDOH:  (Social Determinants of Health) assessments and interventions performed: Yes SDOH Interventions    Flowsheet Row Most Recent Value  SDOH Interventions   Financial Strain Interventions Intervention Not Indicated  Transportation Interventions Intervention Not Indicated      SDOH Screenings   Alcohol Screen: Low Risk    Last Alcohol Screening Score (AUDIT): 0  Depression (PHQ2-9): Medium Risk   PHQ-2 Score: 17  Financial Resource Strain: Low Risk    Difficulty of Paying Living Expenses: Not very hard  Food Insecurity: No Food Insecurity   Worried About Charity fundraiser in the Last Year: Never true   Ran Out of Food in the Last Year: Never true  Housing: Low Risk    Last Housing Risk Score: 0  Physical Activity: Inactive   Days of Exercise per Week: 0 days   Minutes of Exercise per Session: 0 min  Social Connections: Socially Isolated   Frequency of Communication with Friends and Family: Three times a week   Frequency of Social Gatherings with Friends and Family: Three times a week   Attends Religious Services: Never   Active Member of Clubs or Organizations: No   Attends Archivist Meetings: Never   Marital Status: Divorced  Stress: No Stress Concern Present   Feeling of Stress : Only a little  Tobacco Use: Medium Risk   Smoking Tobacco Use: Never   Smokeless Tobacco Use: Never   Passive Exposure: Yes  Transportation Needs: No Transportation Needs   Lack of Transportation (Medical): No   Lack of Transportation (Non-Medical):  No    Patient reports her days are boring. She reads a lot and goes to [employer] some. She has been living in [place] for about 3-4 years. Patient reports she thinks about alzheimers a lot because it runs in her family. She looked at the memory medication name and it made her depressed thinking about taking it and the possible side effects. Patient does do crosswords and word searches.  Patient is not exercising right now and has not been for the last 2 weeks. She was doing floor exercises for about 20 minutes. She doesn't exercise as much as she used to. Patient was having chest pain and that's when she stopped exercising.   Patient has not slept well since her mother died. She also has some arm cramping at night and tries to eat bananas to help. She has  issues with staying asleep and does go to bed around the same time each night. She takes about an hour nap some days and only drinks coffee once a month. She doesn't drink sodas but does have tea.   CCM Care Plan  Allergies  Allergen Reactions   Bee Pollen Other (See Comments)    Seasonal allergies   Pollen Extract Other (See Comments)    Seasonal allergies    Medications Reviewed Today     Reviewed by Viona Gilmore, [facility] (Pharmacist) on 07/14/21 at 1110  Med List Status: <None>   Medication Order Taking? Sig Documenting Provider Last Dose Status Informant  albuterol (PROAIR HFA) 108 (90 Base) MCG/ACT inhaler [id number] No INHALE 2 PUFFS BY MOUTH EVERY 6 HOURS AS NEEDED FOR WHEEZING  Patient not taking: Reported on 07/14/2021   [place], Betty G, MD Not Taking Active Self  atorvastatin (LIPITOR) 40 MG tablet [id number] Yes TAKE 1 TABLET(40 MG) BY MOUTH DAILY AT 6 PM [place], Betty G, MD Taking Active   carvedilol (COREG) 25 MG tablet [id number] Yes Take 25 mg by mouth 2 (two) times daily. [provider] Taking Active Self  DULoxetine (CYMBALTA) 60 MG capsule [id number] Yes TAKE 1 CAPSULE(60 MG) BY MOUTH DAILY  Patient taking  differently: Take 60 mg by mouth daily.   [place], Betty G, MD Taking Active Self  ELIQUIS 5 MG TABS tablet [id number] Yes TAKE 1 TABLET(5 MG) BY MOUTH TWICE DAILY [place], Betty G, MD Taking Active   famotidine (PEPCID) 20 MG tablet [id number] Yes TAKE 1 TABLET(20 MG) BY MOUTH AT BEDTIME  Patient taking differently: Take 20 mg by mouth at bedtime.   [place], Betty G, MD Taking Active Self  furosemide (LASIX) 40 MG tablet [id number] No TAKE 1 TABLET(40 MG) BY MOUTH DAILY  Patient not taking: Reported on 07/14/2021   [place], Betty G, MD Not Taking Active Self  irbesartan (AVAPRO) 150 MG tablet [id number] Yes Take 0.5 tablets (75 mg total) by mouth daily. [place], Betty G, MD Taking Active   LamoTRIgine 200 MG TB24 24 hour tablet [id number] Yes Take 1 tablet (200 mg total) by mouth at bedtime. [name], MD Taking Active Self  memantine [facility]) 10 MG tablet [id number] Yes Take 1 tablet (10 mg total) by mouth 2 (two) times daily. Suzzanne Cloud, NP Taking Active Self  methocarbamol (ROBAXIN) 500 MG tablet [id number] Yes Take 1 tablet (500 mg total) by mouth every 8 (eight) hours as needed for muscle spasms. Davonna Belling, MD Taking Active   montelukast (SINGULAIR) 10 MG tablet [id number] Yes TAKE 1 TABLET(10 MG) BY MOUTH EVERY DAY IN THE EVENING [place], Malka So,  MD Taking Active   nitroGLYCERIN (NITROSTAT) 0.4 MG SL tablet [id number] Yes Place 1 tablet (0.4 mg total) under the tongue every 5 (five) minutes as needed for chest pain. [place], Betty G, MD Taking Active Self           Med Note [place] Bilberry May 22, 2021 11:23 AM) Pt had 1 dose pta 2.20.23  pantoprazole (PROTONIX) 40 MG tablet [id number] Yes TAKE 1 TABLET(40 MG) BY MOUTH DAILY [place], Betty G, MD Taking Active             Patient Active Problem List   Diagnosis Date Noted   Nonintractable headache 12/26/2020   Atherosclerosis of aorta ([place]) 11/21/2019   Type 2 diabetes mellitus with neurological complications ([place]) 22/48/2500    Insomnia 05/18/2019   Bilateral hand pain 02/28/2018   Muscular pain 11/15/2017   Fall 11/15/2017   Sprain of right ankle 11/15/2017   Cystocele, unspecified (CODE) 09/18/2017   Mild cognitive impairment 09/12/2017   Right flank pain 09/05/2017   Right lower quadrant abdominal pain 09/05/2017   Generalized osteoarthritis of multiple sites 01/08/2017   Renal angiomyolipoma, right 08/17/2016   Cough 07/17/2016   Genital herpes 01/24/2016   Prediabetes 01/24/2016   Chest pain    Coronary artery disease, occlusive: mid RCA CTO with L-R collaterals 06/08/2015   Chest wall pain 06/07/2015   Chronic anticoagulation-Eliquis 06/07/2015   CKD (chronic kidney disease), stage III ([place]) 06/07/2015   Abnormal nuclear stress test 06/07/2015   Cardiomyopathy, ischemic - suggested by Myoview 06/07/2015   Bilateral leg edema 05/31/2015   Nummular eczematous dermatitis 03/08/2015   History of pulmonary embolus (PE)-May 2016 08/20/2014   Benign essential tremor 08/20/2014   Intrinsic asthma 08/20/2014   Morbid obesity ([place]) 08/17/2014   Lumbar stenosis with neurogenic claudication 06/02/2014   Anxiety    Seizure ([place])    Essential hypertension 09/23/2012   Fibromyalgia    Cough variant asthma 09/03/2011   Osteopenia 12/19/2009   Graves' disease 10/25/2009   Depression 10/25/2009   Headache 10/25/2009   Constipation 08/15/2007   KNEE PAIN, BILATERAL 05/16/2007   Sleep difficulties 01/21/2007   Dyslipidemia, goal LDL below 70 05/30/2006   Allergic rhinitis 05/30/2006   GASTROESOPHAGEAL REFLUX, NO ESOPHAGITIS 05/30/2006    Immunization History  Administered Date(s) Administered   Fluad Quad(high Dose 65+) 01/25/2020   Influenza Whole 05/21/2007, 12/31/2007, 12/10/2010   Influenza, High Dose Seasonal PF 12/22/2012, 11/23/2016   Influenza-Unspecified 12/31/2011, 12/31/2013, 02/15/2015, 12/21/2015   PFIZER(Purple Top)SARS-COV-2 Vaccination 04/23/2019, 05/14/2019, 01/16/2020, 01/31/2020,  08/26/2020   Pneumococcal Conjugate-13 08/17/2014, 02/25/2018   Pneumococcal Polysaccharide-23 05/21/2007, 12/22/2012   Td 04/03/2003   Tdap 06/11/2019   Zoster Recombinat (Shingrix) 01/30/2021   Zoster, Live 06/30/2007    Conditions to be addressed/monitored:  Hypertension, Hyperlipidemia, Diabetes, Coronary Artery Disease, GERD, Asthma, Chronic Kidney Disease, Depression, Anxiety, Osteopenia, Allergic Rhinitis, and Graves' disease  Care Plan : [place]  Updates made by Viona Gilmore, [place] since 07/26/2021 12:00 AM     Problem: Problem: Hypertension, Hyperlipidemia, Diabetes, Coronary Artery Disease, GERD, Asthma, Chronic Kidney Disease, Depression, Anxiety, Osteopenia, Allergic Rhinitis, and Graves' disease      Long-Range Goal: Patient-Specific Goal   Start Date: 07/14/2021  Expected End Date: 07/15/2022  This Visit's Progress: On track  Priority: High  Note:   Current Barriers:  Unable to independently monitor therapeutic efficacy Unable to maintain control of blood pressure Unable to self administer medications as prescribed  Pharmacist Clinical  Goal(s):  Patient will achieve adherence to monitoring guidelines and medication adherence to achieve therapeutic efficacy maintain control of blood pressure as evidenced by home and office readings  through collaboration with PharmD and provider.   Interventions: 1:1 collaboration with [place], Betty G, MD regarding development and update of comprehensive plan of care as evidenced by provider attestation and co-signature Inter-disciplinary care team collaboration (see longitudinal plan of care) Comprehensive medication review performed; medication list updated in electronic medical record  Hypertension (BP goal <130/80) -Not ideally controlled -Current treatment: Carvedilol 25 mg 1 tablet twice daily - Appropriate, Query effective, Safe, Accessible Irbesartan 150 mg 1/2 tablet daily - Appropriate, Query effective,  Safe, Accessible -Medications previously tried: n/a  -Current home readings:  arm cuff (146/70, 175/77, 125/60, 144/76, 142/83, 161/86, 152/82) -Current dietary habits: cut out salt; not using a salt shaker and uses higher sodium seasoning; frozen vegetables - doesn't eat out much and only eats hamburgers if she is eating out  -Current exercise habits: not exercising as much within the last few weeks -Denies hypotensive/hypertensive symptoms -Educated on BP goals and benefits of medications for prevention of heart attack, stroke and kidney damage; Daily salt intake goal < 2300 mg; Exercise goal of 150 minutes per week; Importance of home blood pressure monitoring; Proper BP monitoring technique; -Counseled to monitor BP at home twice weekly, document, and provide log at future appointments -Counseled on diet and exercise extensively Recommended to continue current medication Recommended bringing BP cuff to next office visit to ensure accuracy.  Hyperlipidemia: (LDL goal < 70) -Not ideally controlled -Current treatment: Atorvastatin 40 mg 1 tablet daily - Appropriate, Query effective, Safe, Accessible -Medications previously tried: none  -Current dietary patterns: did not discuss -Current exercise habits: not exercising as much lately -Educated on Cholesterol goals;  Benefits of statin for ASCVD risk reduction; Importance of limiting foods high in cholesterol; -Counseled on diet and exercise extensively Recommended to continue current medication Recommended repeat lipid panel and consider increasing dose if LDL not at goal < 70.  CAD (Goal: prevent heart events) -Controlled -Current treatment  Nitroglycerin 0.4 mg 1 tablet as needed - Appropriate, Effective, Safe, Accessible Atorvastatin 40 mg 1 tablet daily - Appropriate, Effective, Safe, Accessible Eliquis 5 mg 1 tablet twice daily - Appropriate, Effective, Safe, Accessible -Medications previously tried: none  -Recommended  checking expiration date on nitroglycerin.    Asthma (Goal: control symptoms) -Controlled -Current treatment  Albuterol HFA as needed - Appropriate, Effective, Safe, Accessible Montelukast 10 mg 1 tablet daily - Query Appropriate, Query effective, Safe, Accessible -Medications previously tried: none  -Pulmonary function testing: n/a -Exacerbations requiring treatment in last 6 months: none -Patient denies consistent use of maintenance inhaler -Frequency of rescue inhaler use: not using currently -Counseled on When to use rescue inhaler -Recommended stopping montelukast given lack of benefit.  Epilepsy (Goal: prevent seizures) -Controlled -Current treatment: Lamotrigine 200 mg 1 tablet at bedtime - Appropriate, Effective, Safe, Accessible -Medications previously tried/failed: none -Recommended to continue current medication  Fibromyalgia (Goal: minimize pain) -Controlled -Current treatment  Duloxetine 60 mg 1 capsule daily - Appropriate, Effective, Safe, Accessible -Medications previously tried: none  -Recommended to continue current medication   Osteopenia (Goal prevent fractures) -Not ideally controlled -Last DEXA Scan: 08/2019   T-Score femoral neck: -1.8  T-Score total hip: n/a  T-Score lumbar spine: -0.2  T-Score forearm radius: n/a  10-year probability of major osteoporotic fracture: 5.6%  10-year probability of hip fracture: 1.3% -Patient is not a candidate for pharmacologic treatment -  Current treatment  No medications -Medications previously tried: none  -Recommend [phone] units of vitamin D daily. Recommend 1200 mg of calcium daily from dietary and supplemental sources. Recommend weight-bearing and muscle strengthening exercises for building and maintaining bone density. -Counseled on diet and exercise extensively  Memory loss (Goal: slow progression of memory loss) -Controlled -Current treatment  Memantine 10 mg 1 tablet twice daily - Appropriate, Effective,  Safe, Accessible -Medications previously tried: none  -Counseled on benefits of crossword puzzles/wordsearches, exercise and low sugar diet and impact on memory.  Pain/muscle spasms (Goal: minimize pain/muscle spasms) -Controlled -Current treatment  Hydrocodone-APAP 5-325 mg 1 tablet as needed - Appropriate, Effective, Safe, Accessible Methocarbamol 500 mg 1 tablet every 8 hours as needed - Appropriate, Effective, Safe, Accessible Diclofenac 1% gel as needed- Appropriate, Effective, Safe, Accessible -Medications previously tried: none  -Recommended to continue current medication  GERD (Goal: minimize symptoms) -Controlled -Current treatment  Pantoprazole 40 mg 1 tablet daily - Appropriate, Effective, Safe, Accessible Famotidine 20 mg 1 tablet at bedtime - Appropriate, Effective, Safe, Accessible -Medications previously tried: none  -Counseled on non-pharmacologic management of symptoms such as elevating the head of your bed, avoiding eating 2-3 hours before bed, avoiding triggering foods such as acidic, spicy, or fatty foods, eating smaller meals, and wearing clothes that are loose around the waist  Swelling (Goal: minimize fluid retention) -Uncontrolled -Current treatment  Furosemide 40 mg 1 tablet daily - not taking -Medications previously tried: none  -Recommended discuss with PCP about trying alternative medication.  History of PE (Goal: prevent blood clots) -Controlled -Current treatment  Eliquis 5 mg 1 tablet twice daily - Appropriate, Effective, Safe, Accessible -Medications previously tried: none  -Recommended to continue current medication Counseled on monitoring for signs of bleeding such as unexplained and excessive bleeding from a cut or injury, easy or excessive bruising, blood in urine or stools, and nosebleeds without a known cause.   Health Maintenance -Vaccine gaps: second dose of shingrix -Current therapy:  Hydrocortisone cream 1% as needed -Educated on Cost  vs benefit of each product must be carefully weighed by individual consumer -Patient is satisfied with current therapy and denies issues -Recommended to continue current medication  Patient Goals/Self-Care Activities Patient will:  - check blood pressure a few days a week, document, and provide at future appointments target a minimum of 150 minutes of moderate intensity exercise weekly  Follow Up Plan: The care management team will reach out to the patient again over the next 30 days.        Medication Assistance: None required.  Patient affirms current coverage meets needs.  Compliance/Adherence/Medication fill history: Care Gaps: Shingrix, colonoscopy, eye exam Last BP - 140/80 on 06/21/2021 Last A1C - 5.9 on 02/06/2021  Star-Rating Drugs: Atorvastatin  40 mg - last filled 06/13/2021 90DS at [facility] Irbesartan 150 mg - last filled 03/03/2021 90DS at [facility] verified with Ronalee Belts  Patient's preferred pharmacy is:  [facility] [place], [place] [address] Phone: [phone] Fax: [phone]  Uses pill box? Yes - weekly (fixes it) Pt endorses 99% compliance - misses dose once a month  We discussed: Benefits of medication synchronization, packaging and delivery as well as enhanced pharmacist oversight with [employer]. Patient decided to: Continue current medication management strategy  Care Plan and Follow Up Patient Decision:  Patient agrees to Care Plan and Follow-up.  Plan: The care management team will  reach out to the patient again over the next 30 days.  Jeni Salles, PharmD, [place] Pharmacist [place] [facility]

## 2021-07-13 ENCOUNTER — Telehealth: Payer: Self-pay | Admitting: Pharmacist

## 2021-07-13 NOTE — Chronic Care Management (AMB) (Signed)
? ? ?  Chronic Care Management ?Pharmacy Assistant  ? ?Name: [name]  MRN: [id number] DOB: 1940-05-13 ? ?07/14/2021 APPOINTMENT REMINDER ? ? ?Called [name], No answer, left message of appointment on 07/14/2021 at 10:30 via office visit with Jeni Salles, Pharm D. Notified to have all medications, supplements, blood pressure and/or blood sugar logs available during appointment and to return call if need to reschedule. ? ?Care Gaps: ?AWV - message sent to [name] ?Last BP - 140/80 on 06/21/2021 ?Last A1C - 5.9 on 02/06/2021 ?Shingrix - never done ?Colonoscopy - overdue ?  ?Star Rating Drug: ?Atorvastatin  40 mg - last filled 06/13/2021 90DS at [facility] ?Irbesartan 150 mg - last filled 07/08/2021 90DS at [facility]  ? ?Any gaps in medications fill history? No ? ?Gennie Alma CMA  ?Clinical Pharmacist Assistant ?[phone] ? ? ?

## 2021-07-13 NOTE — Chronic Care Management (AMB) (Signed)
Addendum error ?

## 2021-07-14 ENCOUNTER — Ambulatory Visit (INDEPENDENT_AMBULATORY_CARE_PROVIDER_SITE_OTHER): Payer: Medicare Other | Admitting: Pharmacist

## 2021-07-14 VITALS — BP 130/78

## 2021-07-14 DIAGNOSIS — I10 Essential (primary) hypertension: Secondary | ICD-10-CM

## 2021-07-14 DIAGNOSIS — E785 Hyperlipidemia, unspecified: Secondary | ICD-10-CM

## 2021-07-14 NOTE — Patient Instructions (Addendum)
Hi [name], ? ?It was great to get to meet you in person! Below is a summary of some of the topics we discussed.  ? ?Don't forget to: ?Look for timed release melatonin and take no more than 5 mg per night ?Look for a supplement with calcium citrate 600 mg and 1000 units (25 mcg) of vitamin D ?Try out [email or web address] for some recipes ?Bring your blood pressure cuff to your appointment with Dr. Martinique to make sure it's accurate ? ?I also scheduled you for a telephone follow up call at the end of August which is listed on your upcoming appointments, if that day or time doesn't work let me know! ? ?Please reach out to me if you have any questions or need anything before our follow up! ? ?Best, ?Maddie ? ?Jeni Salles, PharmD, BCACP ?Clinical Pharmacist ?Therapist, music at [place] ?[phone] ? ? Visit Information ? ? Goals Addressed   ?None ?  ? ?Patient Care Plan: [place]  ?  ? ?Problem Identified: Problem: Hypertension, Hyperlipidemia, Diabetes, Coronary Artery Disease, GERD, Asthma, Chronic Kidney Disease, Depression, Anxiety, Osteopenia, Allergic Rhinitis, and Graves' disease   ?  ? ?Long-Range Goal: Patient-Specific Goal   ?Start Date: 07/14/2021  ?Expected End Date: 07/15/2022  ?This Visit's Progress: On track  ?Priority: High  ?Note:   ?Current Barriers:  ?Unable to independently monitor therapeutic efficacy ?Unable to maintain control of blood pressure ?Unable to self administer medications as prescribed ? ?Pharmacist Clinical Goal(s):  ?Patient will achieve adherence to monitoring guidelines and medication adherence to achieve therapeutic efficacy ?maintain control of blood pressure as evidenced by home and office readings  through collaboration with PharmD and provider.  ? ?Interventions: ?1:1 collaboration with Martinique, Betty G, MD regarding development and update of comprehensive plan of care as evidenced by provider attestation and co-signature ?Inter-disciplinary care  team collaboration (see longitudinal plan of care) ?Comprehensive medication review performed; medication list updated in electronic medical record ? ?Hypertension (BP goal <130/80) ?-Not ideally controlled ?-Current treatment: ?Carvedilol 25 mg 1 tablet twice daily - Appropriate, Query effective, Safe, Accessible ?Irbesartan 150 mg 1/2 tablet daily - Appropriate, Query effective, Safe, Accessible ?-Medications previously tried: n/a  ?-Current home readings:  arm cuff (146/70, 175/77, 125/60, 144/76, 142/83, 161/86, 152/82) ?-Current dietary habits: cut out salt; not using a salt shaker and uses higher sodium seasoning; frozen vegetables - doesn't eat out much and only eats hamburgers if she is eating out  ?-Current exercise habits: not exercising as much within the last few weeks ?-Denies hypotensive/hypertensive symptoms ?-Educated on BP goals and benefits of medications for prevention of heart attack, stroke and kidney damage; ?Daily salt intake goal < 2300 mg; ?Exercise goal of 150 minutes per week; ?Importance of home blood pressure monitoring; ?Proper BP monitoring technique; ?-Counseled to monitor BP at home twice weekly, document, and provide log at future appointments ?-Counseled on diet and exercise extensively ?Recommended to continue current medication ?Recommended bringing BP cuff to next office visit to ensure accuracy. ? ?Hyperlipidemia: (LDL goal < 70) ?-Not ideally controlled ?-Current treatment: ?Atorvastatin 40 mg 1 tablet daily - Appropriate, Query effective, Safe, Accessible ?-Medications previously tried: none  ?-Current dietary patterns: did not discuss ?-Current exercise habits: not exercising as much lately ?-Educated on Cholesterol goals;  ?Benefits of statin for ASCVD risk reduction; ?Importance of limiting foods high in cholesterol; ?-Counseled on diet and exercise extensively ?Recommended to continue current medication ?Recommended repeat lipid panel and consider increasing dose if LDL  not at goal <  70. ? ?CAD (Goal: prevent heart events) ?-Controlled ?-Current treatment  ?Nitroglycerin 0.4 mg 1 tablet as needed - Appropriate, Effective, Safe, Accessible ?Atorvastatin 40 mg 1 tablet daily - Appropriate, Effective, Safe, Accessible ?Eliquis 5 mg 1 tablet twice daily - Appropriate, Effective, Safe, Accessible ?-Medications previously tried: none  ?-Recommended checking expiration date on nitroglycerin.  ? ? ?Asthma (Goal: control symptoms) ?-Controlled ?-Current treatment  ?Albuterol HFA as needed - Appropriate, Effective, Safe, Accessible ?Montelukast 10 mg 1 tablet daily - Query Appropriate, Query effective, Safe, Accessible ?-Medications previously tried: none  ?-Pulmonary function testing: n/a ?-Exacerbations requiring treatment in last 6 months: none ?-Patient denies consistent use of maintenance inhaler ?-Frequency of rescue inhaler use: not using currently ?-Counseled on When to use rescue inhaler ?-Recommended stopping montelukast given lack of benefit. ? ?Epilepsy (Goal: prevent seizures) ?-Controlled ?-Current treatment: ?Lamotrigine 200 mg 1 tablet at bedtime - Appropriate, Effective, Safe, Accessible ?-Medications previously tried/failed: none ?-Recommended to continue current medication ? ?Fibromyalgia (Goal: minimize pain) ?-Controlled ?-Current treatment  ?Duloxetine 60 mg 1 capsule daily - Appropriate, Effective, Safe, Accessible ?-Medications previously tried: none  ?-Recommended to continue current medication ? ? ?Osteopenia (Goal prevent fractures) ?-Not ideally controlled ?-Last DEXA Scan: 08/2019  ? T-Score femoral neck: -1.8 ? T-Score total hip: n/a ? T-Score lumbar spine: -0.2 ? T-Score forearm radius: n/a ? 10-year probability of major osteoporotic fracture: 5.6% ? 10-year probability of hip fracture: 1.3% ?-Patient is not a candidate for pharmacologic treatment ?-Current treatment  ?No medications ?-Medications previously tried: none  ?-Recommend [phone] units of vitamin D  daily. Recommend 1200 mg of calcium daily from dietary and supplemental sources. Recommend weight-bearing and muscle strengthening exercises for building and maintaining bone density. ?-Counseled on diet and exercise extensively ? ?Memory loss (Goal: slow progression of memory loss) ?-Controlled ?-Current treatment  ?Memantine 10 mg 1 tablet twice daily - Appropriate, Effective, Safe, Accessible ?-Medications previously tried: none  ?-Counseled on benefits of crossword puzzles/wordsearches, exercise and low sugar diet and impact on memory. ? ?Pain/muscle spasms (Goal: minimize pain/muscle spasms) ?-Controlled ?-Current treatment  ?Hydrocodone-APAP 5-325 mg 1 tablet as needed - Appropriate, Effective, Safe, Accessible ?Methocarbamol 500 mg 1 tablet every 8 hours as needed - Appropriate, Effective, Safe, Accessible ?Diclofenac 1% gel as needed- Appropriate, Effective, Safe, Accessible ?-Medications previously tried: none  ?-Recommended to continue current medication ? ?GERD (Goal: minimize symptoms) ?-Controlled ?-Current treatment  ?Pantoprazole 40 mg 1 tablet daily - Appropriate, Effective, Safe, Accessible ?Famotidine 20 mg 1 tablet at bedtime - Appropriate, Effective, Safe, Accessible ?-Medications previously tried: none  ?-Counseled on non-pharmacologic management of symptoms such as elevating the head of your bed, avoiding eating 2-3 hours before bed, avoiding triggering foods such as acidic, spicy, or fatty foods, eating smaller meals, and wearing clothes that are loose around the waist ? ?Swelling (Goal: minimize fluid retention) ?-Uncontrolled ?-Current treatment  ?Furosemide 40 mg 1 tablet daily - not taking ?-Medications previously tried: none  ?-Recommended discuss with PCP about trying alternative medication. ? ?History of PE (Goal: prevent blood clots) ?-Controlled ?-Current treatment  ?Eliquis 5 mg 1 tablet twice daily - Appropriate, Effective, Safe, Accessible ?-Medications previously tried: none   ?-Recommended to continue current medication ?Counseled on monitoring for signs of bleeding such as unexplained and excessive bleeding from a cut or injury, easy or excessive bruising, blood in urine or stool

## 2021-07-25 NOTE — Progress Notes (Deleted)
? ?Complete physical exam ? ?Patient: [name]   DOB: Nov 26, 1940   81 y.o. Female  MRN: [id number] ? ?Subjective:  ?  ?No chief complaint on file. ? ? ?[name] is a 81 y.o. female who presents today for a complete physical exam. She reports consuming a {diet types:17450} diet. {types:19826} She generally feels {DESC; WELL/FAIRLY WELL/POORLY:18703}. She reports sleeping {DESC; WELL/FAIRLY WELL/POORLY:18703}. She {does/does not:200015} have additional problems to discuss today.  ? ? ?Most recent fall risk assessment: ? ?  06/21/2021  ?  8:04 AM  ?Fall Risk   ?Falls in the past year? 0  ?Number falls in past yr: 0  ?Injury with Fall? 0  ?Risk for fall due to : No Fall Risks  ?Follow up Falls evaluation completed  ? ?  ?Most recent depression screenings: ? ?  06/21/2021  ?  8:03 AM 02/06/2021  ? 12:59 PM  ?PHQ 2/9 Scores  ?PHQ - 2 Score 4 2  ?PHQ- 9 Score 17 17  ? ? ?{VISON DENTAL STD PSA (Optional):27386} ? ?{History (Optional):23778} ? ?Patient Care Team: ?Martinique, Betty G, MD as PCP - General (Family Medicine) ?[name], MD as Consulting Physician (Neurology) ?Frederik Pear, MD as Consulting Physician (Orthopedic Surgery) ?Jacelyn Pi, MD as Consulting Physician (Endocrinology) ?Kathee Delton, MD (Pulmonary Disease) ?Croitoru, Mihai, MD (Cardiology) ?Leeroy Cha, MD (Neurosurgery) ?Viona Gilmore, [facility] as Pharmacist (Pharmacist)  ? ?Outpatient Medications Prior to Visit  ?Medication Sig  ? albuterol (PROAIR HFA) 108 (90 Base) MCG/ACT inhaler INHALE 2 PUFFS BY MOUTH EVERY 6 HOURS AS NEEDED FOR WHEEZING (Patient not taking: Reported on 07/14/2021)  ? atorvastatin (LIPITOR) 40 MG tablet TAKE 1 TABLET(40 MG) BY MOUTH DAILY AT 6 PM  ? carvedilol (COREG) 25 MG tablet Take 25 mg by mouth 2 (two) times daily.  ? DULoxetine (CYMBALTA) 60 MG capsule TAKE 1 CAPSULE(60 MG) BY MOUTH DAILY (Patient taking differently: Take 60 mg by mouth daily.)  ? ELIQUIS 5 MG TABS tablet TAKE 1  TABLET(5 MG) BY MOUTH TWICE DAILY  ? famotidine (PEPCID) 20 MG tablet TAKE 1 TABLET(20 MG) BY MOUTH AT BEDTIME (Patient taking differently: Take 20 mg by mouth at bedtime.)  ? furosemide (LASIX) 40 MG tablet TAKE 1 TABLET(40 MG) BY MOUTH DAILY (Patient not taking: Reported on 07/14/2021)  ? irbesartan (AVAPRO) 150 MG tablet Take 0.5 tablets (75 mg total) by mouth daily.  ? LamoTRIgine 200 MG TB24 24 hour tablet Take 1 tablet (200 mg total) by mouth at bedtime.  ? memantine (NAMENDA) 10 MG tablet Take 1 tablet (10 mg total) by mouth 2 (two) times daily.  ? methocarbamol (ROBAXIN) 500 MG tablet Take 1 tablet (500 mg total) by mouth every 8 (eight) hours as needed for muscle spasms.  ? montelukast (SINGULAIR) 10 MG tablet TAKE 1 TABLET(10 MG) BY MOUTH EVERY DAY IN THE EVENING  ? nitroGLYCERIN (NITROSTAT) 0.4 MG SL tablet Place 1 tablet (0.4 mg total) under the tongue every 5 (five) minutes as needed for chest pain.  ? pantoprazole (PROTONIX) 40 MG tablet TAKE 1 TABLET(40 MG) BY MOUTH DAILY  ? ?No facility-administered medications prior to visit.  ? ? ?ROS ? ? ? ? ?   ?Objective:  ? ?  ?There were no vitals taken for this visit. ?{Vitals History (Optional):23777} ? ?Physical Exam  ? ?No results found for any visits on 07/26/21. ?Last CBC ?Lab Results  ?Component Value Date  ? WBC 6.4 06/10/2021  ? HGB 13.1 06/10/2021  ?  HCT 41.6 06/10/2021  ? MCV 91.8 06/10/2021  ? MCH 28.9 06/10/2021  ? RDW 13.7 06/10/2021  ? PLT 163 06/10/2021  ? ?Last metabolic panel ?Lab Results  ?Component Value Date  ? GLUCOSE 106 (H) 06/10/2021  ? NA 137 06/10/2021  ? K 4.1 06/10/2021  ? CL 103 06/10/2021  ? CO2 25 06/10/2021  ? BUN 19 06/10/2021  ? CREATININE 1.51 (H) 06/10/2021  ? GFRNONAA 35 (L) 06/10/2021  ? CALCIUM 9.1 06/10/2021  ? PROT 7.3 02/10/2021  ? ALBUMIN 4.0 04/10/2021  ? BILITOT 0.3 02/10/2021  ? ALKPHOS 92 02/10/2021  ? AST 13 (L) 02/10/2021  ? ALT 13 02/10/2021  ? ANIONGAP 9 06/10/2021  ? ?Last lipids ?Lab Results  ?Component  Value Date  ? CHOL 135 07/13/2020  ? HDL 45.50 07/13/2020  ? [place] 74 07/13/2020  ? LDLDIRECT 172.2 08/07/2010  ? TRIG 78.0 07/13/2020  ? CHOLHDL 3 07/13/2020  ? ?Last hemoglobin A1c ?Lab Results  ?Component Value Date  ? HGBA1C 5.9 02/06/2021  ? ?Last thyroid functions ?Lab Results  ?Component Value Date  ? TSH 2.30 09/15/2019  ? ?Last vitamin D ?Lab Results  ?Component Value Date  ? VD25OH 42.2 04/10/2021  ? ?Last vitamin B12 and Folate ?Lab Results  ?Component Value Date  ? MAUQJFHL45 1,012 (H) 09/15/2019  ? ?  ?   ?Assessment & Plan:  ?  ?Routine Health Maintenance and Physical Exam ? ?Immunization History  ?Administered Date(s) Administered  ? Fluad Quad(high Dose 65+) 01/25/2020  ? Influenza Whole 05/21/2007, 12/31/2007, 12/10/2010  ? Influenza, High Dose Seasonal PF 12/22/2012, 11/23/2016  ? Influenza-Unspecified 12/31/2011, 12/31/2013, 02/15/2015, 12/21/2015  ? PFIZER(Purple Top)SARS-COV-2 Vaccination 04/23/2019, 05/14/2019, 01/16/2020, 01/31/2020, 08/26/2020  ? Pneumococcal Conjugate-13 08/17/2014, 02/25/2018  ? Pneumococcal Polysaccharide-23 05/21/2007, 12/22/2012  ? Td 04/03/2003  ? Tdap 06/11/2019  ? Zoster, Live 06/30/2007  ? ? ?Health Maintenance  ?Topic Date Due  ? Zoster Vaccines- Shingrix (1 of 2) Never done  ? COLONOSCOPY (Pts 45-88yr Insurance coverage will need to be confirmed)  11/28/2020  ? OPHTHALMOLOGY EXAM  09/06/2021 (Originally 01/26/2021)  ? HEMOGLOBIN A1C  08/06/2021  ? DEXA SCAN  08/11/2021  ? INFLUENZA VACCINE  10/31/2021  ? FOOT EXAM  02/06/2022  ? TETANUS/TDAP  06/10/2029  ? Pneumonia Vaccine 80 Years old  Completed  ? COVID-19 Vaccine  Completed  ? Hepatitis C Screening  Completed  ? HPV VACCINES  Aged Out  ? ? ?Discussed health benefits of physical activity, and encouraged her to engage in regular exercise appropriate for her age and condition. ? ?Problem List Items Addressed This Visit   ?None ? ?No follow-ups on file. ? ?  ? ? ? ? ?

## 2021-07-26 ENCOUNTER — Encounter: Payer: Medicare Other | Admitting: Family Medicine

## 2021-07-26 DIAGNOSIS — E785 Hyperlipidemia, unspecified: Secondary | ICD-10-CM

## 2021-07-26 DIAGNOSIS — I2699 Other pulmonary embolism without acute cor pulmonale: Secondary | ICD-10-CM | POA: Diagnosis not present

## 2021-07-26 DIAGNOSIS — Z0000 Encounter for general adult medical examination without abnormal findings: Secondary | ICD-10-CM

## 2021-07-26 DIAGNOSIS — I10 Essential (primary) hypertension: Secondary | ICD-10-CM

## 2021-07-26 DIAGNOSIS — I2510 Atherosclerotic heart disease of native coronary artery without angina pectoris: Secondary | ICD-10-CM | POA: Diagnosis not present

## 2021-07-26 DIAGNOSIS — E1149 Type 2 diabetes mellitus with other diabetic neurological complication: Secondary | ICD-10-CM
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
## 2021-07-30 DIAGNOSIS — Z7722 Contact with and (suspected) exposure to environmental tobacco smoke (acute) (chronic): Secondary | ICD-10-CM

## 2021-07-30 DIAGNOSIS — I10 Essential (primary) hypertension: Secondary | ICD-10-CM

## 2021-07-30 DIAGNOSIS — E785 Hyperlipidemia, unspecified: Secondary | ICD-10-CM

## 2021-07-30 DIAGNOSIS — J45909 Unspecified asthma, uncomplicated: Secondary | ICD-10-CM

## 2021-07-30 DIAGNOSIS — I2510 Atherosclerotic heart disease of native coronary artery without angina pectoris: Secondary | ICD-10-CM | POA: Diagnosis not present

## 2021-07-30 DIAGNOSIS — M8580 Other specified disorders of bone density and structure, unspecified site: Secondary | ICD-10-CM

## 2021-08-14 ENCOUNTER — Telehealth: Payer: Self-pay | Admitting: Pharmacist

## 2021-08-14 NOTE — Chronic Care Management (AMB) (Signed)
Chronic Care Management Pharmacy Assistant   Name: [name]  MRN: [id number] DOB: 1940/04/11  Reason for Encounter: Disease State Hypertension Assessment Call   Conditions to be addressed/monitored: HTN   Recent office visits:  None  Recent consult visits:  None  Hospital visits:  None  Medications: Outpatient Encounter Medications as of 08/14/2021  Medication Sig Note   albuterol (PROAIR HFA) 108 (90 Base) MCG/ACT inhaler INHALE 2 PUFFS BY MOUTH EVERY 6 HOURS AS NEEDED FOR WHEEZING (Patient not taking: Reported on 07/14/2021)    atorvastatin (LIPITOR) 40 MG tablet TAKE 1 TABLET(40 MG) BY MOUTH DAILY AT 6 PM    carvedilol (COREG) 25 MG tablet Take 25 mg by mouth 2 (two) times daily.    DULoxetine (CYMBALTA) 60 MG capsule TAKE 1 CAPSULE(60 MG) BY MOUTH DAILY (Patient taking differently: Take 60 mg by mouth daily.)    ELIQUIS 5 MG TABS tablet TAKE 1 TABLET(5 MG) BY MOUTH TWICE DAILY    famotidine (PEPCID) 20 MG tablet TAKE 1 TABLET(20 MG) BY MOUTH AT BEDTIME (Patient taking differently: Take 20 mg by mouth at bedtime.)    furosemide (LASIX) 40 MG tablet TAKE 1 TABLET(40 MG) BY MOUTH DAILY (Patient not taking: Reported on 07/14/2021)    irbesartan (AVAPRO) 150 MG tablet Take 0.5 tablets (75 mg total) by mouth daily.    LamoTRIgine 200 MG TB24 24 hour tablet Take 1 tablet (200 mg total) by mouth at bedtime.    memantine (NAMENDA) 10 MG tablet Take 1 tablet (10 mg total) by mouth 2 (two) times daily.    methocarbamol (ROBAXIN) 500 MG tablet Take 1 tablet (500 mg total) by mouth every 8 (eight) hours as needed for muscle spasms.    montelukast (SINGULAIR) 10 MG tablet TAKE 1 TABLET(10 MG) BY MOUTH EVERY DAY IN THE EVENING    nitroGLYCERIN (NITROSTAT) 0.4 MG SL tablet Place 1 tablet (0.4 mg total) under the tongue every 5 (five) minutes as needed for chest pain. 05/22/2021: Pt had 1 dose pta 2.20.23   pantoprazole (PROTONIX) 40 MG tablet TAKE 1 TABLET(40 MG) BY MOUTH  DAILY    No facility-administered encounter medications on file as of 08/14/2021.  Fill History: ALBUTEROL HFA INH (200 PUFFS)8.5GM 06/12/2019 75   ATORVASTATIN '40MG'$  TABLETS 06/13/2021 90   CARVEDILOL '25MG'$  TABLETS 06/16/2021 90   DULOXETINE DR '60MG'$  CAPSULES 07/08/2021 90   FAMOTIDINE '20MG'$  TABLETS 07/08/2021 90   FUROSEMIDE '40MG'$  TABLETS 05/01/2021 90   IRBESARTAN '150MG'$  TABLETS 07/08/2021 90   MEMANTINE '10MG'$  TABLETS 07/08/2021 90   MONTELUKAST '10MG'$  TABLETS 06/13/2021 90   NITROGLYCERIN 0.'4MG'$  SUB TAB 25S 12/22/2020 5   PANTOPRAZOLE '40MG'$  TABLETS 06/13/2021 90   Reviewed chart prior to disease state call. Spoke with patient regarding BP  Recent Office Vitals: BP Readings from Last 3 Encounters:  07/14/21 130/78  06/21/21 140/80  06/10/21 121/84   Pulse Readings from Last 3 Encounters:  06/21/21 94  06/10/21 70  05/22/21 63    Wt Readings from Last 3 Encounters:  06/21/21 200 lb 8 oz (90.9 kg)  04/14/21 193 lb (87.5 kg)  02/10/21 188 lb 15 oz (85.7 kg)     Kidney Function Lab Results  Component Value Date/Time   CREATININE 1.51 (H) 06/10/2021 10:35 AM   CREATININE 1.59 (H) 05/22/2021 09:15 AM   CREATININE 1.54 (H) 11/18/2019 10:08 AM   CREATININE 1.09 01/14/2014 10:19 AM   GFR 37.74 (L) 02/06/2021 09:58 AM   GFRNONAA 35 (L) 06/10/2021 10:35 AM  GFRNONAA 32 (L) 11/18/2019 10:08 AM   GFRAA 37 (L) 11/18/2019 10:08 AM       Latest Ref Rng & Units 06/10/2021   10:35 AM 05/22/2021    9:15 AM 04/10/2021   12:00 AM  BMP  Glucose 70 - 99 mg/dL 106   149     BUN 8 - 23 mg/dL '19   20   18       '$ Creatinine 0.44 - 1.00 mg/dL 1.51   1.59   1.6       Sodium 135 - 145 mmol/L 137   135   141       Potassium 3.5 - 5.1 mmol/L 4.1   3.9   4.9       Chloride 98 - 111 mmol/L 103   103   104       CO2 22 - 32 mmol/L '25   23   29       '$ Calcium 8.9 - 10.3 mg/dL 9.1   8.9   10.1          This result is from an external source.    Current antihypertensive regimen:  Carvedilol  25 mg twice daily Irbesartan 150 mg 1/2 tablet daily  How often are you checking your Blood Pressure?   Current home BP readings:   What recent interventions/DTPs have been made by any provider to improve Blood Pressure control since last CPP Visit: Decrease salt intake to less than 2300 mg daily and increase exercise.   Any recent hospitalizations or ED visits since last visit with CPP?   What diet changes have been made to improve Blood Pressure Control?  Since your visit with on 07/14/2021 have you decreased your salt intake to <2300 mg?   What exercise is being done to improve your Blood Pressure Control?  Since your visit on 07/14/2021 have you been able to increase your weekly exercise to 150 min per week?  Adherence Review: Is the patient currently on ACE/ARB medication? Yes Does the patient have >5 day gap between last estimated fill dates? No  Unable to reach patient after several attempts.   Care Gaps: AWV - completed 08/18/2020 Last BP - 140/80 on 06/21/21 Colonoscopy - overdue Shingrix - overdue A1C - overdue Dexascan - overdue Eye exam - postponed  Star Rating Drugs: Atorvastatin 40 mg - last filled 06/13/2021 90 DS at [facility] Irbesartan 150 mg - last filled 07/08/2021 90 DS at [facility] Pharmacist Assistant [phone]
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
## 2021-08-16 ENCOUNTER — Ambulatory Visit (INDEPENDENT_AMBULATORY_CARE_PROVIDER_SITE_OTHER): Payer: Medicare Other | Admitting: Family Medicine

## 2021-08-16 ENCOUNTER — Encounter: Payer: Self-pay | Admitting: Family Medicine

## 2021-08-16 VITALS — BP 130/82 | HR 73 | Temp 98.4°F | Resp 16 | Ht 63.0 in

## 2021-08-16 DIAGNOSIS — E785 Hyperlipidemia, unspecified: Secondary | ICD-10-CM | POA: Diagnosis not present

## 2021-08-16 DIAGNOSIS — Z0000 Encounter for general adult medical examination without abnormal findings: Secondary | ICD-10-CM | POA: Diagnosis not present

## 2021-08-16 DIAGNOSIS — E1149 Type 2 diabetes mellitus with other diabetic neurological complication: Secondary | ICD-10-CM | POA: Diagnosis not present

## 2021-08-16 DIAGNOSIS — Z1211 Encounter for screening for malignant neoplasm of colon: Secondary | ICD-10-CM

## 2021-08-16 DIAGNOSIS — N1832 Chronic kidney disease, stage 3b: Secondary | ICD-10-CM | POA: Diagnosis not present

## 2021-08-16 DIAGNOSIS — I10 Essential (primary) hypertension: Secondary | ICD-10-CM

## 2021-08-16 LAB — LIPID PANEL
Cholesterol: 136 mg/dL (ref 0–200)
HDL: 49.3 mg/dL (ref >39.00)
LDL Cholesterol: 70 mg/dL (ref 0–99)
NonHDL: 86.48
Total CHOL/HDL Ratio: 3
Triglycerides: 83 mg/dL (ref 0.0–149.0)
VLDL: 16.6 mg/dL (ref 0.0–40.0)

## 2021-08-16 LAB — HEMOGLOBIN A1C: Hgb A1c MFr Bld: 6 % (ref 4.6–6.5)

## 2021-08-16 NOTE — Patient Instructions (Addendum)
A few things to remember from today's visit: ? ? ?Type 2 diabetes mellitus with neurological complications ([place]) - Plan: Hemoglobin A1c ? ?Stage 3b chronic kidney disease ([place]) ? ?Dyslipidemia, goal LDL below 70 - Plan: Lipid panel ? ?Essential hypertension ? ?Routine general medical examination at a health care facility ? ?Colon cancer screening - Plan: Ambulatory referral to Gastroenterology ? ?If you need refills please call your pharmacy. ?Do not use My Chart to request refills or for acute issues that need immediate attention. ?  ?Preventive Care 17 Years and Older, Female ?Preventive care refers to lifestyle choices and visits with your health care provider that can promote health and wellness. Preventive care visits are also called wellness exams. ?What can I expect for my preventive care visit? ?Counseling ?Your health care provider may ask you questions about your: ?Medical history, including: ?Past medical problems. ?Family medical history. ?Pregnancy and menstrual history. ?History of falls. ?Current health, including: ?Memory and ability to understand (cognition). ?Emotional well-being. ?Home life and relationship well-being. ?Sexual activity and sexual health. ?Lifestyle, including: ?Alcohol, nicotine or tobacco, and drug use. ?Access to firearms. ?Diet, exercise, and sleep habits. ?Work and work Statistician. ?Sunscreen use. ?Safety issues such as seatbelt and bike helmet use. ?Physical exam ?Your health care provider will check your: ?Height and weight. These may be used to calculate your BMI (body mass index). BMI is a measurement that tells if you are at a healthy weight. ?Waist circumference. This measures the distance around your waistline. This measurement also tells if you are at a healthy weight and may help predict your risk of certain diseases, such as type 2 diabetes and high blood pressure. ?Heart rate and blood pressure. ?Body temperature. ?Skin for abnormal spots. ?What immunizations do I  need? ? ?Vaccines are usually given at various ages, according to a schedule. Your health care provider will recommend vaccines for you based on your age, medical history, and lifestyle or other factors, such as travel or where you work. ?What tests do I need? ?Screening ?Your health care provider may recommend screening tests for certain conditions. This may include: ?Lipid and cholesterol levels. ?Hepatitis C test. ?Hepatitis B test. ?HIV (human immunodeficiency virus) test. ?STI (sexually transmitted infection) testing, if you are at risk. ?Lung cancer screening. ?Colorectal cancer screening. ?Diabetes screening. This is done by checking your blood sugar (glucose) after you have not eaten for a while (fasting). ?Mammogram. Talk with your health care provider about how often you should have regular mammograms. ?BRCA-related cancer screening. This may be done if you have a family history of breast, ovarian, tubal, or peritoneal cancers. ?Bone density scan. This is done to screen for osteoporosis. ?Talk with your health care provider about your test results, treatment options, and if necessary, the need for more tests. ?Follow these instructions at home: ?Eating and drinking ? ?Eat a diet that includes fresh fruits and vegetables, whole grains, lean protein, and low-fat dairy products. Limit your intake of foods with high amounts of sugar, saturated fats, and salt. ?Take vitamin and mineral supplements as recommended by your health care provider. ?Do not drink alcohol if your health care provider tells you not to drink. ?If you drink alcohol: ?Limit how much you have to 0-1 drink a day. ?Know how much alcohol is in your drink. In the [place] one drink equals one 12 oz bottle of beer (355 mL), one 5 oz glass of wine (148 mL), or one 1? oz glass of hard liquor (44 mL). ?Lifestyle ?Brush  your teeth every morning and night with fluoride toothpaste. Floss one time each day. ?Exercise for at least 30 minutes 5 or more days  each week. ?Do not use any products that contain nicotine or tobacco. These products include cigarettes, chewing tobacco, and vaping devices, such as e-cigarettes. If you need help quitting, ask your health care provider. ?Do not use drugs. ?If you are sexually active, practice safe sex. Use a condom or other form of protection in order to prevent STIs. ?Take aspirin only as told by your health care provider. Make sure that you understand how much to take and what form to take. Work with your health care provider to find out whether it is safe and beneficial for you to take aspirin daily. ?Ask your health care provider if you need to take a cholesterol-lowering medicine (statin). ?Find healthy ways to manage stress, such as: ?Meditation, yoga, or listening to music. ?Journaling. ?Talking to a trusted person. ?Spending time with friends and family. ?Minimize exposure to UV radiation to reduce your risk of skin cancer. ?Safety ?Always wear your seat belt while driving or riding in a vehicle. ?Do not drive: ?If you have been drinking alcohol. Do not ride with someone who has been drinking. ?When you are tired or distracted. ?While texting. ?If you have been using any mind-altering substances or drugs. ?Wear a helmet and other protective equipment during sports activities. ?If you have firearms in your house, make sure you follow all gun safety procedures. ?What's next? ?Visit your health care provider once a year for an annual wellness visit. ?Ask your health care provider how often you should have your eyes and teeth checked. ?Stay up to date on all vaccines. ?This information is not intended to replace advice given to you by your health care provider. Make sure you discuss any questions you have with your health care provider. ?Document Revised: 09/14/2020 Document Reviewed: 09/14/2020 ?Elsevier Patient Education ? [place]. ? ? ?Please be sure medication list is accurate. ?If a new problem present, please  set up appointment sooner than planned today. ? ? ? ? ? ? ? ?

## 2021-08-21 ENCOUNTER — Encounter: Payer: Self-pay | Admitting: Internal Medicine

## 2021-08-30 ENCOUNTER — Encounter (HOSPITAL_COMMUNITY): Payer: Self-pay

## 2021-08-30 ENCOUNTER — Ambulatory Visit (HOSPITAL_COMMUNITY)
Admission: EM | Admit: 2021-08-30 | Discharge: 2021-08-30 | Disposition: A | Discharge status: Home / Self Care | Payer: Medicare Other | Attending: Family Medicine | Admitting: Family Medicine

## 2021-08-30 DIAGNOSIS — R102 Pelvic and perineal pain: Secondary | ICD-10-CM

## 2021-08-30 DIAGNOSIS — R5383 Other fatigue: Secondary | ICD-10-CM | POA: Diagnosis not present

## 2021-08-30 LAB — COMPREHENSIVE METABOLIC PANEL
ALT: 16 U/L (ref 0–44)
AST: 17 U/L (ref 15–41)
Albumin: 3.3 g/dL — ABNORMAL LOW (ref 3.5–5.0)
Alkaline Phosphatase: 86 U/L (ref 38–126)
Anion gap: 6 (ref 5–15)
BUN: 16 mg/dL (ref 8–23)
CO2: 24 mmol/L (ref 22–32)
Calcium: 9 mg/dL (ref 8.9–10.3)
Chloride: 106 mmol/L (ref 98–111)
Creatinine, Ser: 1.47 mg/dL — ABNORMAL HIGH (ref 0.44–1.00)
GFR, Estimated: 36 mL/min — ABNORMAL LOW (ref >60)
Glucose, Bld: 102 mg/dL — ABNORMAL HIGH (ref 70–99)
Potassium: 4.8 mmol/L (ref 3.5–5.1)
Sodium: 136 mmol/L (ref 135–145)
Total Bilirubin: 0.4 mg/dL (ref 0.3–1.2)
Total Protein: 6.9 g/dL (ref 6.5–8.1)

## 2021-08-30 LAB — CBC WITH DIFFERENTIAL/PLATELET
Abs Immature Granulocytes: 0.02 10*3/uL (ref 0.00–0.07)
Basophils Absolute: 0 10*3/uL (ref 0.0–0.1)
Basophils Relative: 1 %
Eosinophils Absolute: 0.1 10*3/uL (ref 0.0–0.5)
Eosinophils Relative: 2 %
HCT: 42.5 % (ref 36.0–46.0)
Hemoglobin: 13.8 g/dL (ref 12.0–15.0)
Immature Granulocytes: 0 %
Lymphocytes Relative: 33 %
Lymphs Abs: 2.6 10*3/uL (ref 0.7–4.0)
MCH: 30.1 pg (ref 26.0–34.0)
MCHC: 32.5 g/dL (ref 30.0–36.0)
MCV: 92.8 fL (ref 80.0–100.0)
Monocytes Absolute: 0.8 10*3/uL (ref 0.1–1.0)
Monocytes Relative: 10 %
Neutro Abs: 4.5 10*3/uL (ref 1.7–7.7)
Neutrophils Relative %: 54 %
Platelets: 192 10*3/uL (ref 150–400)
RBC: 4.58 MIL/uL (ref 3.87–5.11)
RDW: 13.9 % (ref 11.5–15.5)
WBC: 8 10*3/uL (ref 4.0–10.5)
nRBC: 0 % (ref 0.0–0.2)

## 2021-08-30 LAB — POCT URINALYSIS DIPSTICK, ED / UC
Bilirubin Urine: NEGATIVE
Glucose, UA: NEGATIVE mg/dL
Ketones, ur: NEGATIVE mg/dL
Leukocytes,Ua: NEGATIVE
Nitrite: NEGATIVE
Protein, ur: NEGATIVE mg/dL
Specific Gravity, Urine: <=1.005 (ref 1.005–1.030)
Urobilinogen, UA: 0.2 mg/dL (ref 0.0–1.0)
pH: 5.5 (ref 5.0–8.0)

## 2021-08-30 LAB — TSH: TSH: 2.374 u[IU]/mL (ref 0.350–4.500)

## 2021-08-30 MED ORDER — CEPHALEXIN 500 MG PO CAPS: 500.0000 mg | ORAL_CAPSULE | Freq: Two times a day (BID) | ORAL | 0 refills | Status: DC

## 2021-08-30 NOTE — Discharge Instructions (Addendum)
You have had labs (blood work and a urine culture) sent today. We will call you with any significant abnormalities or if there is need to begin or change treatment or pursue further follow up.  You may also review your test results online through [facility]. If you do not have a MyChart account, instructions to sign up should be on your discharge paperwork.

## 2021-08-30 NOTE — ED Triage Notes (Signed)
Pt presents for fatigue over the last couple of days. No other symptoms at this time.
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
## 2021-09-06 ENCOUNTER — Telehealth: Payer: Self-pay | Admitting: Family Medicine

## 2021-09-06 NOTE — Telephone Encounter (Signed)
Left a detailed message for the patient to return a call to the office with her questions regarding lab tests.

## 2021-09-06 NOTE — Telephone Encounter (Signed)
Spoke with patient to schedule AWV.  Patient declined to do AWV this year.  She stated to call back 2024

## 2021-09-06 NOTE — Telephone Encounter (Signed)
Pt would like callback to go over her blood work she had in may
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
## 2021-09-26 ENCOUNTER — Ambulatory Visit (INDEPENDENT_AMBULATORY_CARE_PROVIDER_SITE_OTHER): Payer: Medicare Other | Admitting: Family Medicine

## 2021-09-26 ENCOUNTER — Encounter: Payer: Self-pay | Admitting: Family Medicine

## 2021-09-26 ENCOUNTER — Ambulatory Visit (INDEPENDENT_AMBULATORY_CARE_PROVIDER_SITE_OTHER): Payer: Medicare Other

## 2021-09-26 VITALS — BP 110/70 | HR 83 | Temp 98.4°F | Resp 16 | Ht 63.0 in

## 2021-09-26 DIAGNOSIS — J4520 Mild intermittent asthma, uncomplicated: Secondary | ICD-10-CM | POA: Diagnosis not present

## 2021-09-26 DIAGNOSIS — R059 Cough, unspecified: Secondary | ICD-10-CM

## 2021-09-26 DIAGNOSIS — R509 Fever, unspecified: Secondary | ICD-10-CM

## 2021-09-26 LAB — CBC WITH DIFFERENTIAL/PLATELET
Basophils Absolute: 0 10*3/uL (ref 0.0–0.1)
Basophils Relative: 0.4 % (ref 0.0–3.0)
Eosinophils Absolute: 0.1 10*3/uL (ref 0.0–0.7)
Eosinophils Relative: 1.8 % (ref 0.0–5.0)
HCT: 41.8 % (ref 36.0–46.0)
Hemoglobin: 13.5 g/dL (ref 12.0–15.0)
Lymphocytes Relative: 33.9 % (ref 12.0–46.0)
Lymphs Abs: 1.9 10*3/uL (ref 0.7–4.0)
MCHC: 32.3 g/dL (ref 30.0–36.0)
MCV: 90.7 fl (ref 78.0–100.0)
Monocytes Absolute: 0.6 10*3/uL (ref 0.1–1.0)
Monocytes Relative: 10.1 % (ref 3.0–12.0)
Neutro Abs: 3 10*3/uL (ref 1.4–7.7)
Neutrophils Relative %: 53.8 % (ref 43.0–77.0)
Platelets: 199 10*3/uL (ref 150.0–400.0)
RBC: 4.6 Mil/uL (ref 3.87–5.11)
RDW: 14.2 % (ref 11.5–15.5)
WBC: 5.6 10*3/uL (ref 4.0–10.5)

## 2021-09-26 LAB — POC COVID19 BINAXNOW: SARS Coronavirus 2 Ag: NEGATIVE

## 2021-09-26 LAB — POCT INFLUENZA A/B
Influenza A, POC: NEGATIVE
Influenza B, POC: NEGATIVE

## 2021-09-26 LAB — C-REACTIVE PROTEIN: CRP: 1.1 mg/dL (ref 0.5–20.0)

## 2021-09-26 MED ORDER — ALBUTEROL SULFATE HFA 108 (90 BASE) MCG/ACT IN AERS: INHALATION_SPRAY | RESPIRATORY_TRACT | 8 refills | Status: DC

## 2021-10-04 ENCOUNTER — Other Ambulatory Visit: Payer: Self-pay | Admitting: Family Medicine

## 2021-10-04 DIAGNOSIS — K219 Gastro-esophageal reflux disease without esophagitis: Secondary | ICD-10-CM

## 2021-10-05 ENCOUNTER — Other Ambulatory Visit: Payer: Self-pay | Admitting: *Deleted

## 2021-10-05 MED ORDER — MEMANTINE HCL 10 MG PO TABS: 10.0000 mg | ORAL_TABLET | Freq: Two times a day (BID) | ORAL | 0 refills | Status: DC

## 2021-10-24 DIAGNOSIS — R7309 Other abnormal glucose: Secondary | ICD-10-CM | POA: Diagnosis not present

## 2021-10-24 DIAGNOSIS — H524 Presbyopia: Secondary | ICD-10-CM | POA: Diagnosis not present

## 2021-10-24 DIAGNOSIS — H04123 Dry eye syndrome of bilateral lacrimal glands: Secondary | ICD-10-CM | POA: Diagnosis not present

## 2021-10-24 DIAGNOSIS — E0500 Thyrotoxicosis with diffuse goiter without thyrotoxic crisis or storm: Secondary | ICD-10-CM | POA: Diagnosis not present

## 2021-10-24 DIAGNOSIS — H40023 Open angle with borderline findings, high risk, bilateral: Secondary | ICD-10-CM | POA: Diagnosis not present

## 2021-10-25 DIAGNOSIS — N189 Chronic kidney disease, unspecified: Secondary | ICD-10-CM | POA: Diagnosis not present

## 2021-10-25 DIAGNOSIS — I2510 Atherosclerotic heart disease of native coronary artery without angina pectoris: Secondary | ICD-10-CM | POA: Diagnosis not present

## 2021-10-25 DIAGNOSIS — I10 Essential (primary) hypertension: Secondary | ICD-10-CM | POA: Diagnosis not present

## 2021-10-25 DIAGNOSIS — E785 Hyperlipidemia, unspecified: Secondary | ICD-10-CM | POA: Diagnosis not present

## 2021-10-27 ENCOUNTER — Other Ambulatory Visit: Payer: Self-pay | Admitting: Family Medicine

## 2021-10-27 ENCOUNTER — Telehealth: Payer: Self-pay

## 2021-10-27 DIAGNOSIS — M159 Polyosteoarthritis, unspecified: Secondary | ICD-10-CM

## 2021-10-27 DIAGNOSIS — M797 Fibromyalgia: Secondary | ICD-10-CM

## 2021-10-27 NOTE — Telephone Encounter (Signed)
[facility] patient on 10/25/21 requesting call back about PREP VMT patient today returning call to discuss

## 2021-10-29 ENCOUNTER — Other Ambulatory Visit: Payer: Self-pay | Admitting: Family Medicine

## 2021-10-29 DIAGNOSIS — M48062 Spinal stenosis, lumbar region with neurogenic claudication: Secondary | ICD-10-CM

## 2021-10-29 DIAGNOSIS — M797 Fibromyalgia: Secondary | ICD-10-CM

## 2021-11-07 DIAGNOSIS — N183 Chronic kidney disease, stage 3 unspecified: Secondary | ICD-10-CM | POA: Diagnosis not present

## 2021-11-07 DIAGNOSIS — I5030 Unspecified diastolic (congestive) heart failure: Secondary | ICD-10-CM | POA: Diagnosis not present

## 2021-11-07 DIAGNOSIS — R609 Edema, unspecified: Secondary | ICD-10-CM | POA: Diagnosis not present

## 2021-11-07 DIAGNOSIS — I129 Hypertensive chronic kidney disease with stage 1 through stage 4 chronic kidney disease, or unspecified chronic kidney disease: Secondary | ICD-10-CM | POA: Diagnosis not present

## 2021-11-07 DIAGNOSIS — E1122 Type 2 diabetes mellitus with diabetic chronic kidney disease: Secondary | ICD-10-CM | POA: Diagnosis not present

## 2021-11-13 ENCOUNTER — Encounter (HOSPITAL_COMMUNITY): Payer: Self-pay | Admitting: Emergency Medicine

## 2021-11-13 ENCOUNTER — Ambulatory Visit (HOSPITAL_COMMUNITY)
Admission: EM | Admit: 2021-11-13 | Discharge: 2021-11-13 | Disposition: A | Discharge status: Home / Self Care | Payer: Medicare Other | Attending: Emergency Medicine | Admitting: Emergency Medicine

## 2021-11-13 DIAGNOSIS — E785 Hyperlipidemia, unspecified: Secondary | ICD-10-CM | POA: Insufficient documentation

## 2021-11-13 DIAGNOSIS — U071 COVID-19: Secondary | ICD-10-CM | POA: Diagnosis not present

## 2021-11-13 DIAGNOSIS — M353 Polymyalgia rheumatica: Secondary | ICD-10-CM | POA: Diagnosis not present

## 2021-11-13 DIAGNOSIS — J45909 Unspecified asthma, uncomplicated: Secondary | ICD-10-CM | POA: Insufficient documentation

## 2021-11-13 DIAGNOSIS — K219 Gastro-esophageal reflux disease without esophagitis: Secondary | ICD-10-CM | POA: Diagnosis not present

## 2021-11-13 DIAGNOSIS — I129 Hypertensive chronic kidney disease with stage 1 through stage 4 chronic kidney disease, or unspecified chronic kidney disease: Secondary | ICD-10-CM | POA: Diagnosis not present

## 2021-11-13 DIAGNOSIS — E1122 Type 2 diabetes mellitus with diabetic chronic kidney disease: Secondary | ICD-10-CM | POA: Diagnosis not present

## 2021-11-13 DIAGNOSIS — Z8673 Personal history of transient ischemic attack (TIA), and cerebral infarction without residual deficits: Secondary | ICD-10-CM | POA: Diagnosis not present

## 2021-11-13 DIAGNOSIS — J069 Acute upper respiratory infection, unspecified: Secondary | ICD-10-CM

## 2021-11-13 DIAGNOSIS — E0500 Thyrotoxicosis with diffuse goiter without thyrotoxic crisis or storm: Secondary | ICD-10-CM | POA: Insufficient documentation

## 2021-11-13 LAB — POC INFLUENZA A AND B ANTIGEN (URGENT CARE ONLY)
INFLUENZA A ANTIGEN, POC: NEGATIVE
INFLUENZA B ANTIGEN, POC: NEGATIVE

## 2021-11-13 MED ORDER — BENZONATATE 100 MG PO CAPS: 100.0000 mg | ORAL_CAPSULE | Freq: Three times a day (TID) | ORAL | 0 refills | Status: DC

## 2021-11-13 MED ORDER — PROMETHAZINE-DM 6.25-15 MG/5ML PO SYRP: 2.5000 mL | ORAL_SOLUTION | Freq: Every evening | ORAL | 0 refills | Status: DC | PRN

## 2021-11-13 MED ORDER — AMOXICILLIN-POT CLAVULANATE 875-125 MG PO TABS: 1.0000 | ORAL_TABLET | Freq: Two times a day (BID) | ORAL | 0 refills | Status: DC

## 2021-11-13 NOTE — ED Triage Notes (Addendum)
Generalized body aches x 1 week. "I feel like I've got the flu." Reports nasal congestion, fatigue. Denies sore throat, ear pain, cough, headache, SOB, chest pain, abdominal pain, nausea, vomiting, diarrhea, dysuria. Has not tried any OTC meds to help as she is unsure which OTC meds she's allowed to take to help manage her symptoms. Hx of heart problems per patient. Reports drinking plenty of water and maintaining an appetite; however, she has no energy to cook/take care of herself.

## 2021-11-13 NOTE — Discharge Instructions (Addendum)
As your symptoms have been present for 7 days we will provide bacterial coverage, take antibiotic if you do not get a call about positive COVID test tomorrow  Flu test is negative  Begin use of Augmentin every morning with food for 7 days  If COVID test is positive, antiviral medicine will be sent in at time of notification, take antiviral medicine every morning and every evening for the next 5 days, this likely bilirubin reduce the amount of virus in your body and will help to minimize your symptoms  You may take Tessalon pill every 8 hours to help calm your coughing  You may use cough syrup at bedtime to help you rest, be mindful this may make you feel sleepy  You may attempt use of over-the-counter Coricidin to help minimize your symptoms  Please avoid using decongestants as this may raise your blood pressure  May attempt any of the following below in addition    You can take Tylenol and/or Ibuprofen as needed for fever reduction and pain relief.   For cough: honey 1/2 to 1 teaspoon (you can dilute the honey in water or another fluid).  You can also use guaifenesin and dextromethorphan for cough. You can use a humidifier for chest congestion and cough.  If you don't have a humidifier, you can sit in the bathroom with the hot shower running.      For sore throat: try warm salt water gargles, cepacol lozenges, throat spray, warm tea or water with lemon/honey, popsicles or ice, or OTC cold relief medicine for throat discomfort.   For congestion: take a daily anti-histamine like Zyrtec, Claritin.  You can also use Flonase 1-2 sprays in each nostril daily.   It is important to stay hydrated: drink plenty of fluids (water, gatorade/powerade/pedialyte, juices, or teas) to keep your throat moisturized and help further relieve irritation/discomfort.
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
## 2021-11-14 ENCOUNTER — Telehealth (HOSPITAL_COMMUNITY): Payer: Self-pay | Admitting: Emergency Medicine

## 2021-11-14 LAB — SARS CORONAVIRUS 2 (TAT 6-24 HRS): SARS Coronavirus 2: POSITIVE — AB

## 2021-11-14 MED ORDER — MOLNUPIRAVIR EUA 200MG CAPSULE: 4.0000 | ORAL_CAPSULE | Freq: Two times a day (BID) | ORAL | 0 refills | Status: AC
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
## 2021-11-24 ENCOUNTER — Ambulatory Visit (INDEPENDENT_AMBULATORY_CARE_PROVIDER_SITE_OTHER): Payer: Medicare Other | Admitting: Family Medicine

## 2021-11-24 ENCOUNTER — Encounter: Payer: Self-pay | Admitting: Family Medicine

## 2021-11-24 VITALS — BP 122/78 | HR 82 | Temp 98.0°F | Resp 16 | Ht 63.0 in | Wt 199.1 lb

## 2021-11-24 DIAGNOSIS — U071 COVID-19: Secondary | ICD-10-CM | POA: Diagnosis not present

## 2021-11-24 DIAGNOSIS — J4520 Mild intermittent asthma, uncomplicated: Secondary | ICD-10-CM

## 2021-11-24 DIAGNOSIS — R059 Cough, unspecified: Secondary | ICD-10-CM | POA: Diagnosis not present

## 2021-11-24 DIAGNOSIS — M797 Fibromyalgia: Secondary | ICD-10-CM | POA: Diagnosis not present

## 2021-11-24 NOTE — Patient Instructions (Addendum)
A few things to remember from today's visit:  Cough, unspecified type  COVID-19 virus infection  Fibromyalgia  Asthma, intermittent, uncomplicated  If you need refills please call your pharmacy. Do not use My Chart to request refills or for acute issues that need immediate attention.   Please be sure medication list is accurate. If a new problem present, please set up appointment sooner than planned today. Continue Albuterol inh as needed. Monitor for new symptoms. Increase physical activity as tolerated. No changes in medications.

## 2021-11-27 ENCOUNTER — Telehealth: Payer: Self-pay | Admitting: Pharmacist

## 2021-11-27 NOTE — Progress Notes (Deleted)
Chronic Care Management Pharmacy Note  11/27/2021 Name:  [name] MRN:  [id number] DOB:  08/31/1940  Summary: BP not at goal < 130/80 per home readings  Recommendations/Changes made from today's visit: -Recommend repeat lipid panel -Recommended bringing BP cuff to next office visit to ensure accuracy -Recommended calcium citrate 600 mg and 1000 units of vitamin D daily -Recommended stopping montelukast given lack of benefit -Recommend repeat DEXA  Plan: Scheduled CPE BP assessment in 1 month   Subjective: [name] is an 81 y.o. year old female who is a primary patient of [name], Timoteo Expose, MD.  The CCM team was consulted for assistance with disease management and care coordination needs.    Engaged with patient by telephone for initial visit in response to provider referral for pharmacy case management and/or care coordination services.   Consent to Services:  The patient was given the following information about Chronic Care Management services today, agreed to services, and gave verbal consent: 1. CCM service includes personalized support from designated clinical staff supervised by the primary care provider, including individualized plan of care and coordination with other care providers 2. 24/7 contact phone numbers for assistance for urgent and routine care needs. 3. Service will only be billed when office clinical staff spend 20 minutes or more in a month to coordinate care. 4. Only one practitioner may furnish and bill the service in a calendar month. 5.The patient may stop CCM services at any time (effective at the end of the month) by phone call to the office staff. 6. The patient will be responsible for cost sharing (co-pay) of up to 20% of the service fee (after annual deductible is met). Patient agreed to services and consent obtained.  Patient Care Team: [name], Cassidy G, MD as PCP - General (Family Medicine) Levert Feinstein, MD as Consulting  Physician (Neurology) Gean Birchwood, MD as Consulting Physician (Orthopedic Surgery) Dorisann Frames, MD as Consulting Physician (Endocrinology) Clance, Maree Krabbe, MD (Pulmonary Disease) Croitoru, Rachelle Hora, MD (Cardiology) Hilda Lias, MD (Neurosurgery) Verner Chol, [facility] as Pharmacist (Pharmacist)  Recent office visits: 11/24/21 Cassidy [place]az[place], MD: Patient presented for asthma. Prescribed albuterol PRN. Follow up in 4 weeks.  09/26/21 Cassidy [place], MD: Patient presented for fever and cough.  Prescribed albuterol PRN.  08/16/21 Cassidy [place], MD: Patient presented for annual exam. Referred to GI for colonoscopy.  06/21/21 [name], Cassidy G, MD - Patient presented for Atherosclerosis of aorta and other concerns. Prescribed Hydrocodone-Acetaminophen. Decreased Irbesartan. Stopped Benzonatate. Stopped Butalbital- APAP   06/06/21 [name], DO - Patient presented for cough and other concerns. Prescribed Benzonatate.  Recent consult visits: 04/14/21 Cassidy Natal, NP (Gastroenterology) - Patient presented for Constipation unspecified. Stopped Polyethylene Glycol.  12/26/2020 [name] (neurology) - Patient was seen for seizure and additional issues. Started Butalbital Apap Caffeine 50-325-40 q 6 hrs prn. Discontinued Tylenol, Prednisone and Spironolactone. Follow up in 1 year.   10/20/2020 [name] DPM (podiatry) - Patient was seen for injury to right foot and additional issues. No medication changes.  Follow up if symptoms worsen or fail to improve.  Hospital visits: Medication Reconciliation was completed by comparing discharge summary, patient's EMR and Pharmacy list, and upon discussion with patient.   Patient presented to [facility] on 11/13/21 due to upper respiratory tract infection. Patient was present for 46 minutes.   New?Medications Started at [name] Discharge:?? -started  Augmentin 875-125 mg Benzonatate 100 mg Promethazine-DM 6.25-15 mg/mL    Medication Changes  at Hospital Discharge: -Changed  None   Medications Discontinued at Hospital Discharge: -Stopped  none   Medications that remain the same after Hospital Discharge:??  -All other medications will remain the same.     Medication Reconciliation was completed by comparing discharge summary, patient's EMR and Pharmacy list, and upon discussion with patient.   [name] on 08/30/21 due to fatigue. Patient was present for 1 hour.   New?Medications Started at [name] Discharge:?? -started  Cephalexin 500 mg BID   Medication Changes at Hospital Discharge: -Changed  None   Medications Discontinued at Hospital Discharge: -Stopped  none   Medications that remain the same after Hospital Discharge:??  -All other medications will remain the same.    Medication Reconciliation was completed by comparing discharge summary, patient's EMR and Pharmacy list, and upon discussion with patient.   Patient presented to [name] ED on 06/10/21 due to Chest Pain. Patient was present for 4 hours.   New?Medications Started at [name] Discharge:?? -started  Methocarbamol   Medication Changes at Hospital Discharge: -Changed  None   Medications Discontinued at Hospital Discharge: -Stopped  none   Medications that remain the same after Hospital Discharge:??  -All other medications will remain the same.     Medication Reconciliation was completed by comparing discharge summary, patient's EMR and Pharmacy list, and upon discussion with patient.   Patient presented to [name] ED on 05/22/21 due to Atypical Chest Pain. Patient was present for 4 hours.   New?Medications Started at [name] Discharge:?? -started  Cyclobenzaprine   Medication Changes at Hospital Discharge: -Changed  None   Medications Discontinued at Hospital Discharge: -Stopped  none   Medications that remain the same after  Hospital Discharge:??  -All other medications will remain the same.  Patient was seen at [facility] ED for constipation.  Patient was discharged 02/10/2021 after 4 hours.  Patient was started on Polyethylene Glycol 17gms daily.  Objective:  Lab Results  Component Value Date   CREATININE 1.47 (H) 08/30/2021   BUN 16 08/30/2021   GFR 37.74 (L) 02/06/2021   GFRNONAA 36 (L) 08/30/2021   GFRAA 37 (L) 11/18/2019   NA 136 08/30/2021   K 4.8 08/30/2021   CALCIUM 9.0 08/30/2021   CO2 24 08/30/2021   GLUCOSE 102 (H) 08/30/2021    Lab Results  Component Value Date/Time   HGBA1C 6.0 08/16/2021 09:05 AM   HGBA1C 5.9 02/06/2021 09:58 AM   FRUCTOSAMINE 226 02/06/2021 09:58 AM   FRUCTOSAMINE 225 05/18/2020 02:52 PM   GFR 37.74 (L) 02/06/2021 09:58 AM   GFR 27.26 (L) 11/02/2020 02:59 PM   MICROALBUR <0.2 11/18/2019 10:08 AM    Last diabetic Eye exam:  Lab Results  Component Value Date/Time   HMDIABEYEEXA No Retinopathy 01/27/2020 07:03 AM    Last diabetic Foot exam: No results found for: "HMDIABFOOTEX"   Lab Results  Component Value Date   CHOL 136 08/16/2021   HDL 49.30 08/16/2021   LDLCALC 70 08/16/2021   LDLDIRECT 172.2 08/07/2010   TRIG 83.0 08/16/2021   CHOLHDL 3 08/16/2021       Latest Ref Rng & Units 08/30/2021    3:39 PM 04/10/2021   12:00 AM 02/10/2021    6:30 PM  Hepatic Function  Total Protein 6.5 - 8.1 [name]/dL 6.9   7.3   Albumin 3.5 - 5.0 [name]/dL 3.3  4.0     3.6   AST 15 - 41 U/L  17   13   ALT 0 - 44 U/L 16   13   Alk Phosphatase 38 - 126 U/L 86   92   Total Bilirubin 0.3 - 1.2 mg/dL 0.4   0.3      This result is from an external source.    Lab Results  Component Value Date/Time   TSH 2.374 08/30/2021 03:39 PM   TSH 2.30 09/15/2019 10:44 AM   TSH 2.76 07/31/2016 03:40 PM   FREET4 0.91 01/24/2016 04:26 PM   FREET4 0.99 08/21/2014 11:48 AM       Latest Ref Rng & Units 09/26/2021    1:07 PM 08/30/2021    3:39 PM 06/10/2021   10:35 AM  CBC   WBC 4.0 - 10.5 K/uL 5.6  8.0  6.4   Hemoglobin 12.0 - 15.0 [name]/dL 13.5  13.8  13.1   Hematocrit 36.0 - 46.0 % 41.8  42.5  41.6   Platelets 150.0 - 400.0 K/uL 199.0  192  163     Lab Results  Component Value Date/Time   VD25OH 42.2 04/10/2021 12:00 AM    Clinical ASCVD: Yes  The ASCVD Risk score (Arnett DK, et al., 2019) failed to calculate for the following reasons:   The 2019 ASCVD risk score is only valid for ages 41 to 43       11/24/2021    4:24 PM 08/16/2021    8:27 AM 06/21/2021    8:03 AM  Depression screen PHQ 2/9  Decreased Interest 0 0 3  Down, Depressed, Hopeless 0 0 1  PHQ - 2 Score 0 0 4  Altered sleeping 0 0 1  Tired, decreased energy 1 0 2  Change in appetite 2 0 3  Feeling bad or failure about yourself  0 0 2  Trouble concentrating 2 0 3  Moving slowly or fidgety/restless 1 0 2  Suicidal thoughts 0 0 0  PHQ-9 Score 6 0 17  Difficult doing work/chores Not difficult at all Not difficult at all Not difficult at all       Social History   Tobacco Use  Smoking Status Never   Passive exposure: Yes  Smokeless Tobacco Never  Tobacco Comments   From Father.   BP Readings from Last 3 Encounters:  11/24/21 122/78  11/13/21 (!) 113/58  09/26/21 110/70   Pulse Readings from Last 3 Encounters:  11/24/21 82  11/13/21 81  09/26/21 83   Wt Readings from Last 3 Encounters:  11/24/21 199 lb 2 oz (90.3 kg)  06/21/21 200 lb 8 oz (90.9 kg)  04/14/21 193 lb (87.5 kg)   BMI Readings from Last 3 Encounters:  11/24/21 35.27 kg/m  09/26/21 35.52 kg/m  08/16/21 35.52 kg/m    Assessment/Interventions: Review of patient past medical history, allergies, medications, health status, including review of consultants reports, laboratory and other test data, was performed as part of comprehensive evaluation and provision of chronic care management services.   SDOH:  (Social Determinants of Health) assessments and interventions performed: Yes   SDOH Screenings    Alcohol Screen: Low Risk  (08/18/2020)   Alcohol Screen    Last Alcohol Screening Score (AUDIT): 0  Depression (PHQ2-9): Medium Risk (11/24/2021)   Depression (PHQ2-9)    PHQ-2 Score: 6  Financial Resource Strain: Low Risk  (07/14/2021)   Overall Financial Resource Strain (CARDIA)    Difficulty of Paying Living Expenses: Not very hard  Food Insecurity: No Food Insecurity (08/18/2020)   Hunger Vital Sign  Worried About Charity fundraiser in the Last Year: Never true    [place] in the Last Year: Never true  Housing: Low Risk  (08/18/2020)   Housing    Last Housing Risk Score: 0  Physical Activity: Inactive (08/18/2020)   Exercise Vital Sign    Days of Exercise per Week: 0 days    Minutes of Exercise per Session: 0 min  Social Connections: Socially Isolated (08/18/2020)   Social Connection and Isolation Panel [NHANES]    Frequency of Communication with Friends and Family: Three times a week    Frequency of Social Gatherings with Friends and Family: Three times a week    Attends Religious Services: Never    Active Member of Clubs or Organizations: No    Attends Archivist Meetings: Never    Marital Status: Divorced  Stress: No Stress Concern Present (08/18/2020)   [place]    Feeling of Stress : Only a little  Tobacco Use: Medium Risk (11/24/2021)   Patient History    Smoking Tobacco Use: Never    Smokeless Tobacco Use: Never    Passive Exposure: Yes  Transportation Needs: No Transportation Needs (07/14/2021)   PRAPARE - Transportation    Lack of Transportation (Medical): No    Lack of Transportation (Non-Medical): No   Patient reports her days are boring. She reads a lot and goes to [employer] some. She has been living in [place] for about 3-4 years. Patient reports she thinks about alzheimers a lot because it runs in her family. She looked at the memory medication name and it made her  depressed thinking about taking it and the possible side effects. Patient does do crosswords and word searches.  Patient is not exercising right now and has not been for the last 2 weeks. She was doing floor exercises for about 20 minutes. She doesn't exercise as much as she used to. Patient was having chest pain and that's when she stopped exercising.   Patient has not slept well since her mother died. She also has some arm cramping at night and tries to eat bananas to help. She has  issues with staying asleep and does go to bed around the same time each night. She takes about an hour nap some days and only drinks coffee once a month. She doesn't drink sodas but does have tea.   CCM Care Plan  Allergies  Allergen Reactions   Bee Pollen Other (See Comments)    Seasonal allergies   Pollen Extract Other (See Comments)    Seasonal allergies    Medications Reviewed Today     Reviewed by Martinique, Cassidy G, MD (Physician) on 11/26/21 at 1212  Med List Status: <None>   Medication Order Taking? Sig Documenting Provider Last Dose Status Informant  albuterol (PROAIR HFA) 108 (90 Base) MCG/ACT inhaler [id number] Yes INHALE 2 PUFFS BY MOUTH EVERY 6 HOURS AS NEEDED FOR WHEEZING Martinique, Cassidy G, MD Taking Active   Discontinued 11/26/21 1205   atorvastatin (LIPITOR) 40 MG tablet [id number] Yes TAKE 1 TABLET(40 MG) BY MOUTH DAILY AT 6 PM Martinique, Cassidy G, MD Taking Active   benzonatate (TESSALON) 100 MG capsule [id number] Yes Take 1 capsule (100 mg total) by mouth every 8 (eight) hours. Hans Eden, NP Taking Active   carvedilol (COREG) 25 MG tablet [id number] Yes Take 25 mg by mouth 2 (two) times daily. [provider] Taking Active Self  DULoxetine (  CYMBALTA) 60 MG capsule [id number] Yes TAKE 1 CAPSULE(60 MG) BY MOUTH DAILY [name], Cassidy G, MD Taking Active   ELIQUIS 5 MG TABS tablet [id number] Yes TAKE 1 TABLET(5 MG) BY MOUTH TWICE DAILY [name], Cassidy G, MD Taking Active   famotidine (PEPCID) 20  MG tablet [id number] Yes TAKE 1 TABLET(20 MG) BY MOUTH AT BEDTIME [name], Cassidy G, MD Taking Active   furosemide (LASIX) 40 MG tablet [id number] Yes TAKE 1 TABLET(40 MG) BY MOUTH DAILY [name], Cassidy G, MD Taking Active Self  irbesartan (AVAPRO) 150 MG tablet [id number] Yes Take 0.5 tablets (75 mg total) by mouth daily. [name], Cassidy G, MD Taking Active   LamoTRIgine 200 MG TB24 24 hour tablet [id number] Yes Take 1 tablet (200 mg total) by mouth at bedtime. Levert Feinstein, MD Taking Active Self  memantine [facility]) 10 MG tablet [id number] Yes Take 1 tablet (10 mg total) by mouth 2 (two) times daily. Levert Feinstein, MD Taking Active   methocarbamol (ROBAXIN) 500 MG tablet [id number] Yes Take 1 tablet (500 mg total) by mouth every 8 (eight) hours as needed for muscle spasms. Benjiman Core, MD Taking Active   montelukast (SINGULAIR) 10 MG tablet [id number] Yes TAKE 1 TABLET(10 MG) BY MOUTH EVERY DAY IN THE EVENING [name], Cassidy G, MD Taking Active   nitroGLYCERIN (NITROSTAT) 0.4 MG SL tablet [id number] Yes Place 1 tablet (0.4 mg total) under the tongue every 5 (five) minutes as needed for chest pain. [name], Cassidy G, MD Taking Active Self           Med Note Asher Muir May 22, 2021 11:23 AM) Pt had 1 dose pta 2.20.23  pantoprazole (PROTONIX) 40 MG tablet [id number] Yes TAKE 1 TABLET(40 MG) BY MOUTH DAILY [name], Cassidy G, MD Taking Active   promethazine-dextromethorphan (PROMETHAZINE-DM) 6.25-15 MG/5ML syrup [id number] Yes Take 2.5 mLs by mouth at bedtime as needed for cough. Valinda Hoar, NP Taking Active             Patient Active Problem List   Diagnosis Date Noted   Nonintractable headache 12/26/2020   Atherosclerosis of aorta (HCC) 11/21/2019   Type 2 diabetes mellitus with neurological complications (HCC) 11/18/2019   Insomnia 05/18/2019   Bilateral hand pain 02/28/2018   Muscular pain 11/15/2017   Fall 11/15/2017   Sprain of right ankle 11/15/2017   Cystocele, unspecified (CODE)  09/18/2017   Mild cognitive impairment 09/12/2017   Right flank pain 09/05/2017   Right lower quadrant abdominal pain 09/05/2017   Generalized osteoarthritis of multiple sites 01/08/2017   Renal angiomyolipoma, right 08/17/2016   Cough 07/17/2016   Genital herpes 01/24/2016   Chest pain    Coronary artery disease, occlusive: mid RCA CTO with L-R collaterals 06/08/2015   Chest wall pain 06/07/2015   Chronic anticoagulation-Eliquis 06/07/2015   CKD (chronic kidney disease), stage III (HCC) 06/07/2015   Abnormal nuclear stress test 06/07/2015   Cardiomyopathy, ischemic - suggested by Myoview 06/07/2015   Bilateral leg edema 05/31/2015   Nummular eczematous dermatitis 03/08/2015   History of pulmonary embolus (PE)-May 2016 08/20/2014   Benign essential tremor 08/20/2014   Intrinsic asthma 08/20/2014   Morbid obesity (HCC) 08/17/2014   Lumbar stenosis with neurogenic claudication 06/02/2014   Anxiety    Seizure (HCC)    Essential hypertension 09/23/2012   Fibromyalgia    Cough variant asthma 09/03/2011   Osteopenia 12/19/2009   Graves' disease 10/25/2009   Depression 10/25/2009   Headache 10/25/2009   Constipation 08/15/2007  KNEE PAIN, BILATERAL 05/16/2007   Sleep difficulties 01/21/2007   Dyslipidemia, goal LDL below 70 05/30/2006   Allergic rhinitis 05/30/2006   GASTROESOPHAGEAL REFLUX, NO ESOPHAGITIS 05/30/2006    Immunization History  Administered Date(s) Administered   Fluad Quad(high Dose 65+) 01/25/2020   Influenza Whole 05/21/2007, 12/31/2007, 12/10/2010   Influenza, High Dose Seasonal PF 12/22/2012, 11/23/2016   Influenza-Unspecified 12/31/2011, 12/31/2013, 02/15/2015, 12/21/2015   PFIZER(Purple Top)SARS-COV-2 Vaccination 04/23/2019, 05/14/2019, 01/16/2020, 01/31/2020, 08/26/2020   Pneumococcal Conjugate-13 08/17/2014, 02/25/2018   Pneumococcal Polysaccharide-23 05/21/2007, 12/22/2012   Td 04/03/2003   Tdap 06/11/2019   Zoster Recombinat (Shingrix) 01/30/2021    Zoster, Live 06/30/2007   -Recommended bringing BP cuff to next office visit to ensure accuracy -Recommended calcium citrate 600 mg and 1000 units of vitamin D daily -Recommended stopping montelukast given lack of benefit -Recommend repeat DEXA  -scheduled colonoscopy?  Conditions to be addressed/monitored:  Hypertension, Hyperlipidemia, Diabetes, Coronary Artery Disease, GERD, Asthma, Chronic Kidney Disease, Depression, Anxiety, Osteopenia, Allergic Rhinitis, and Graves' disease  Conditions addressed this visit: ***  There are no care plans that you recently modified to display for this patient.     Medication Assistance: None required.  Patient affirms current coverage meets needs.  Compliance/Adherence/Medication fill history: Care Gaps: Shingrix, colonoscopy, COVID booster, eye exam, DEXA, influenza Last BP - 122/78 on 11/24/2021 Last A1C - 6.0 on 08/16/2021  Star-Rating Drugs: Atorvastatin 40 mg - last filled 09/14/2021 90 DS at [facility] Irbesartan 150 mg - last filled 07/08/2021 90 DS at [name] verified with Doroteo Bradford  Patient's preferred pharmacy is:  [id number] - [place], [name] [address] AT [address] [place] [address] Phone: [phone] Fax: [phone]  Uses pill box? Yes - weekly (fixes it) Pt endorses 99% compliance - misses dose once a month  We discussed: Benefits of medication synchronization, packaging and delivery as well as enhanced pharmacist oversight with [employer]. Patient decided to: Continue current medication management strategy  Care Plan and Follow Up Patient Decision:  Patient agrees to Care Plan and Follow-up.  Plan: The care management team will reach out to the patient again over the next 30 days.  Jeni Salles, PharmD, [place] Pharmacist [place] at [place]

## 2021-11-27 NOTE — Chronic Care Management (AMB) (Signed)
    Chronic Care Management Pharmacy Assistant   11/28/2021 APPOINTMENT REMINDER  [name] was reminded to have all medications, supplements and any blood glucose and blood pressure readings available for review with, [name] Pharm. D, at her telephone visit on 11/28/2021 at 11:00.  Care Gaps: AWV - message sent to [name] Last BP - 122/78 on 11/24/2021 Last A1C - 6.0 on 08/16/2021 Covid booster - overdue Colonoscopy - overdue Eye exam - overdue Dexa scan - overdue Flu - due Shingrix - postponed  Star Rating Drug: Atorvastatin 40 mg - last filled 09/14/2021 90 DS at [facility] Irbesartan 150 mg - last filled 07/08/2021 90 DS at [facility] verified with [facility]  Any gaps in medications fill history? Yes  [place] Pharmacist Assistant [phone]

## 2021-11-28 ENCOUNTER — Telehealth: Payer: Self-pay | Admitting: Pharmacist

## 2021-11-28 ENCOUNTER — Telehealth: Payer: Self-pay

## 2021-11-28 ENCOUNTER — Telehealth: Payer: Medicare Other

## 2021-11-28 DIAGNOSIS — I10 Essential (primary) hypertension: Secondary | ICD-10-CM

## 2021-11-28 NOTE — Telephone Encounter (Signed)
-----   Message from Viona Gilmore, [facility] sent at 11/27/2021  4:18 PM EDT ----- Regarding: CCM referral Hi,  Can you please put in a CCM referral for Ms. Kutter?  Thanks! Maddie

## 2021-11-28 NOTE — Telephone Encounter (Signed)
  Chronic Care Management   Outreach Note  11/28/2021 Name: [name] MRN: [id number] DOB: 1940-05-30  Referred by: Martinique, Betty G, MD  Patient had a phone appointment scheduled with clinical pharmacist today.  An unsuccessful telephone outreach was attempted today. The patient was referred to the pharmacist for assistance with care management and care coordination.   If possible, a message was left to return call to: [phone] or to [facility] at [facility]: [place], PharmD, [place] Pharmacist [place] at [place]

## 2021-11-28 NOTE — Chronic Care Management (AMB) (Unsigned)
    Chronic Care Management Pharmacy Assistant   Name: [name]  MRN: [id number] DOB: 1941-03-16  Reason for Encounter: Reschedule 11/28/2021 appointment with Jeni Salles Clinical Pharmacist.   Rescheduled with patient for 01/09/2022, patient apologized for missing her appointment, she was having issues with her phone and missed your call.    [place] Pharmacist Assistant [phone]

## 2021-12-06 ENCOUNTER — Encounter (HOSPITAL_COMMUNITY): Payer: Self-pay

## 2021-12-06 ENCOUNTER — Ambulatory Visit (HOSPITAL_COMMUNITY)
Admission: EM | Admit: 2021-12-06 | Discharge: 2021-12-06 | Disposition: A | Discharge status: Home / Self Care | Payer: Medicare Other | Attending: Physician Assistant | Admitting: Physician Assistant

## 2021-12-06 DIAGNOSIS — M62830 Muscle spasm of back: Secondary | ICD-10-CM

## 2021-12-06 DIAGNOSIS — M545 Low back pain, unspecified: Secondary | ICD-10-CM | POA: Diagnosis not present

## 2021-12-06 DIAGNOSIS — G8929 Other chronic pain: Secondary | ICD-10-CM | POA: Diagnosis not present

## 2021-12-06 LAB — POCT URINALYSIS DIPSTICK, ED / UC
Bilirubin Urine: NEGATIVE
Glucose, UA: NEGATIVE mg/dL
Ketones, ur: NEGATIVE mg/dL
Leukocytes,Ua: NEGATIVE
Nitrite: NEGATIVE
Protein, ur: NEGATIVE mg/dL
Specific Gravity, Urine: 1.02 (ref 1.005–1.030)
Urobilinogen, UA: 1 mg/dL (ref 0.0–1.0)
pH: 5.5 (ref 5.0–8.0)

## 2021-12-06 MED ORDER — NAPROXEN 375 MG PO TABS: 375.0000 mg | ORAL_TABLET | Freq: Every evening | ORAL | 0 refills | Status: DC | PRN

## 2021-12-06 MED ORDER — METHOCARBAMOL 500 MG PO TABS: 500.0000 mg | ORAL_TABLET | Freq: Three times a day (TID) | ORAL | 0 refills | Status: DC | PRN
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
## 2021-12-06 NOTE — Discharge Instructions (Addendum)
Advised to use ice therapy, 10 minutes on 20 minutes off, 4-5 times throughout the day to help reduce the pain and discomfort. Advised to take Robaxin at bedtime to help reduce muscle spasm. Advised take Naprosyn 375 1 at bedtime to help reduce lower back pain. Advised to follow-up with PCP or return to urgent care if symptoms fail to improve.

## 2021-12-10 ENCOUNTER — Other Ambulatory Visit: Payer: Self-pay | Admitting: Family Medicine
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
## 2021-12-27 ENCOUNTER — Ambulatory Visit (INDEPENDENT_AMBULATORY_CARE_PROVIDER_SITE_OTHER): Payer: Medicare Other | Admitting: Neurology

## 2021-12-27 ENCOUNTER — Encounter: Payer: Self-pay | Admitting: Neurology

## 2021-12-27 VITALS — BP 113/72 | HR 71 | Ht 63.0 in | Wt 200.0 lb

## 2021-12-27 DIAGNOSIS — G3184 Mild cognitive impairment, so stated: Secondary | ICD-10-CM | POA: Diagnosis not present

## 2021-12-27 DIAGNOSIS — R569 Unspecified convulsions: Secondary | ICD-10-CM | POA: Diagnosis not present

## 2021-12-27 MED ORDER — LAMOTRIGINE ER 200 MG PO TB24
200.0000 mg | ORAL_TABLET | Freq: Every day | ORAL | 4 refills | Status: DC
Start: 2021-12-27 — End: 2022-07-09

## 2021-12-27 MED ORDER — MEMANTINE HCL 10 MG PO TABS: 10.0000 mg | ORAL_TABLET | Freq: Two times a day (BID) | ORAL | 3 refills | Status: DC

## 2021-12-27 NOTE — Patient Instructions (Addendum)
Continue current medications Close monitor of driving for safety Continue exercise, brain stimulating exercises Return back in 1 year

## 2022-01-02 DIAGNOSIS — H5213 Myopia, bilateral: Secondary | ICD-10-CM | POA: Diagnosis not present

## 2022-01-08 ENCOUNTER — Telehealth: Payer: Self-pay | Admitting: Pharmacist

## 2022-01-08 NOTE — Chronic Care Management (AMB) (Signed)
    Chronic Care Management Pharmacy Assistant   Name: Leonetta Mcgivern  MRN: [id number] DOB: March 01, 1941  01/09/2022 APPOINTMENT REMINDER  [name] was reminded to have all medications, supplements and any blood glucose and blood pressure readings available for review with Jeni Salles, Pharm. D, at her office visit on 01/09/2022 at 12:00.  Care Gaps: AWV - 11/27/21 message to [name] Last BP - 113/72 on 12/27/2021 Last A1C - 6.0 on 08/16/2021 Covid - overdue Colonoscopy - overdue Eye exam - overdue Dexa - overdue Flu - due Shingrix - postponed  Star Rating Drug: Atorvastatin 40 mg - last filled 12/11/2021 90 DS at [facility] Irbesartan 150 mg - last filled 07/08/2021 90 DS at [facility] verified with Ronalee Belts  Any gaps in medications fill history? Yes  [place] Pharmacist Assistant [phone]

## 2022-01-09 ENCOUNTER — Telehealth: Payer: Self-pay

## 2022-01-09 ENCOUNTER — Ambulatory Visit (INDEPENDENT_AMBULATORY_CARE_PROVIDER_SITE_OTHER): Payer: Medicare Other | Admitting: Pharmacist

## 2022-01-09 VITALS — BP 140/78

## 2022-01-09 DIAGNOSIS — I10 Essential (primary) hypertension: Secondary | ICD-10-CM

## 2022-01-09 DIAGNOSIS — M797 Fibromyalgia: Secondary | ICD-10-CM

## 2022-01-09 MED ORDER — IRBESARTAN 75 MG PO TABS: 75.0000 mg | ORAL_TABLET | Freq: Every day | ORAL | 2 refills | Status: DC

## 2022-01-09 NOTE — Progress Notes (Signed)
Chronic Care Management Pharmacy Note  01/09/2022 Name:  [name] MRN:  [id number] DOB:  May 20, 1940  Summary: BP not at goal < 130/80 per home or office readings  Recommendations/Changes made from today's visit: -Recommended bringing BP cuff to next office visit to ensure accuracy -Recommended calcium citrate 600 mg and 1000 units of vitamin D daily -Recommend repeat DEXA -Recommended holding montelukast for a few weeks given lack of benefit  Plan: Scheduled PCP visit to address neuropathy and coughing in 1 week BP assessment in 1 month  Subjective: [name] is an 81 y.o. year old female who is a primary patient of Martinique, Malka So, MD.  The CCM team was consulted for assistance with disease management and care coordination needs.    Engaged with patient by telephone for initial visit in response to provider referral for pharmacy case management and/or care coordination services.   Consent to Services:  The patient was given information about Chronic Care Management services, agreed to services, and gave verbal consent prior to initiation of services.  Please see initial visit note for detailed documentation.   Patient Care Team: Martinique, Betty G, MD as PCP - General (Family Medicine) [name], MD as Consulting Physician (Neurology) Frederik Pear, MD as Consulting Physician (Orthopedic Surgery) Jacelyn Pi, MD as Consulting Physician (Endocrinology) Clance, Armando Reichert, MD (Pulmonary Disease) Croitoru, Dani Gobble, MD (Cardiology) Leeroy Cha, MD (Neurosurgery) Viona Gilmore, [facility] as Pharmacist (Pharmacist)  Recent office visits: 11/24/21 Martinique, Betty G, MD: Patient presented for asthma follow up. Refilled albuterol. Recommended follow up in 4 weeks.   09/26/21 Martinique, Betty G, MD: Patient presented for fever, cough and dizziness. Prescribed albuterol PRN.  08/16/21 Martinique, Betty G, MD: Patient presented for annual exam. Referred to  gastroenterology for colonoscopy. Follow up in 6 months.  Recent consult visits: 12/27/21 Butler Denmark, NP (neurology): Patient presented for seizure follow up. Follow up in 1 year.  07/26/21 Charolette Forward (cardiology): Patient presented for follow up. Unable to access notes.  Hospital visits: Medication Reconciliation was completed by comparing discharge summary, patient's EMR and Pharmacy list, and upon discussion with patient.   Patient presented to [facility] at [facility] on 12/06/21 due to back pain. Patient was present for 1 hour.   New?Medications Started at [facility] Discharge:?? -started  Methocarbamol Naproxen   Medication Changes at Hospital Discharge: -Changed  None   Medications Discontinued at Hospital Discharge: -Stopped  none   Medications that remain the same after Hospital Discharge:??  -All other medications will remain the same.     Medication Reconciliation was completed by comparing discharge summary, patient's EMR and Pharmacy list, and upon discussion with patient.   Patient presented to [facility] ED on 11/13/21 due to upper respiratory tract infection. Patient was present for 46 minutes.   New?Medications Started at [facility] Discharge:?? -started  Augmentin Benzonatate Promethazone-DM   Medication Changes at Hospital Discharge: -Changed  None   Medications Discontinued at Hospital Discharge: -Stopped  none   Medications that remain the same after Hospital Discharge:??  -All other medications will remain the same.  Medication Reconciliation was completed by comparing discharge summary, patient's EMR and Pharmacy list, and upon discussion with patient.   Patient presented to [facility] at [facility] on 08/30/21 due to fatigue. Patient was present for 1 hour.   New?Medications Started at [facility] Discharge:?? -started  cephalexin   Medication Changes at Hospital Discharge: -Changed  None    Medications Discontinued  at Hospital Discharge: -Stopped  none   Medications that remain the same after Hospital Discharge:??  -All other medications will remain the same.  Objective:  Lab Results  Component Value Date   CREATININE 1.47 (H) 08/30/2021   BUN 16 08/30/2021   GFR 37.74 (L) 02/06/2021   GFRNONAA 36 (L) 08/30/2021   GFRAA 37 (L) 11/18/2019   NA 136 08/30/2021   K 4.8 08/30/2021   CALCIUM 9.0 08/30/2021   CO2 24 08/30/2021   GLUCOSE 102 (H) 08/30/2021    Lab Results  Component Value Date/Time   HGBA1C 6.0 08/16/2021 09:05 AM   HGBA1C 5.9 02/06/2021 09:58 AM   FRUCTOSAMINE 226 02/06/2021 09:58 AM   FRUCTOSAMINE 225 05/18/2020 02:52 PM   GFR 37.74 (L) 02/06/2021 09:58 AM   GFR 27.26 (L) 11/02/2020 02:59 PM   MICROALBUR <0.2 11/18/2019 10:08 AM    Last diabetic Eye exam:  Lab Results  Component Value Date/Time   HMDIABEYEEXA No Retinopathy 01/27/2020 07:03 AM    Last diabetic Foot exam: No results found for: "HMDIABFOOTEX"   Lab Results  Component Value Date   CHOL 136 08/16/2021   HDL 49.30 08/16/2021   LDLCALC 70 08/16/2021   LDLDIRECT 172.2 08/07/2010   TRIG 83.0 08/16/2021   CHOLHDL 3 08/16/2021       Latest Ref Rng & Units 08/30/2021    3:39 PM 04/10/2021   12:00 AM 02/10/2021    6:30 PM  Hepatic Function  Total Protein 6.5 - 8.1 g/dL 6.9   7.3   Albumin 3.5 - 5.0 g/dL 3.3  4.0     3.6   AST 15 - 41 U/L 17   13   ALT 0 - 44 U/L 16   13   Alk Phosphatase 38 - 126 U/L 86   92   Total Bilirubin 0.3 - 1.2 mg/dL 0.4   0.3      This result is from an external source.    Lab Results  Component Value Date/Time   TSH 2.374 08/30/2021 03:39 PM   TSH 2.30 09/15/2019 10:44 AM   TSH 2.76 07/31/2016 03:40 PM   FREET4 0.91 01/24/2016 04:26 PM   FREET4 0.99 08/21/2014 11:48 AM       Latest Ref Rng & Units 09/26/2021    1:07 PM 08/30/2021    3:39 PM 06/10/2021   10:35 AM  CBC  WBC 4.0 - 10.5 K/uL 5.6  8.0  6.4   Hemoglobin 12.0 - 15.0 g/dL  13.5  13.8  13.1   Hematocrit 36.0 - 46.0 % 41.8  42.5  41.6   Platelets 150.0 - 400.0 K/uL 199.0  192  163     Lab Results  Component Value Date/Time   VD25OH 42.2 04/10/2021 12:00 AM    Clinical ASCVD: Yes  The ASCVD Risk score (Arnett DK, et al., 2019) failed to calculate for the following reasons:   The 2019 ASCVD risk score is only valid for ages 71 to 62       11/24/2021    4:24 PM 08/16/2021    8:27 AM 06/21/2021    8:03 AM  Depression screen PHQ 2/9  Decreased Interest 0 0 3  Down, Depressed, Hopeless 0 0 1  PHQ - 2 Score 0 0 4  Altered sleeping 0 0 1  Tired, decreased energy 1 0 2  Change in appetite 2 0 3  Feeling bad or failure about yourself  0 0 2  Trouble concentrating 2 0 3  Moving slowly or fidgety/restless 1 0 2  Suicidal thoughts 0 0 0  PHQ-9 Score 6 0 17  Difficult doing work/chores Not difficult at all Not difficult at all Not difficult at all       Social History   Tobacco Use  Smoking Status Never   Passive exposure: Yes  Smokeless Tobacco Never  Tobacco Comments   From Father.   BP Readings from Last 3 Encounters:  01/09/22 (!) 140/78  12/27/21 113/72  12/06/21 107/74   Pulse Readings from Last 3 Encounters:  12/27/21 71  12/06/21 80  11/24/21 82   Wt Readings from Last 3 Encounters:  12/27/21 200 lb (90.7 kg)  12/06/21 200 lb (90.7 kg)  11/24/21 199 lb 2 oz (90.3 kg)   BMI Readings from Last 3 Encounters:  12/27/21 35.43 kg/m  12/06/21 35.43 kg/m  11/24/21 35.27 kg/m    Assessment/Interventions: Review of patient past medical history, allergies, medications, health status, including review of consultants reports, laboratory and other test data, was performed as part of comprehensive evaluation and provision of chronic care management services.   SDOH:  (Social Determinants of Health) assessments and interventions performed: Yes (last 07/14/21) SDOH Interventions    Flowsheet Row Chronic Care Management from 07/14/2021 in  [place] at [facility] from 02/06/2021 in [place] at [facility] from 08/18/2020 in [place] at [facility] from 12/16/2017 in [place] from 11/23/2016 in [place] Interventions -- -- Intervention Not Indicated -- --  Housing Interventions -- -- Intervention Not Indicated -- --  Transportation Interventions Intervention Not Indicated -- Intervention Not Indicated -- --  Depression Interventions/Treatment  -- Currently on Treatment -- --  [resources for grief counseling provided] Patient refuses Treatment  [patient states she has contact for counseling if needed]  Financial Strain Interventions Intervention Not Indicated -- Intervention Not Indicated -- --  Physical Activity Interventions -- -- Intervention Not Indicated -- --  Stress Interventions -- -- Intervention Not Indicated -- --  Social Connections Interventions -- -- Intervention Not Indicated -- --      SDOH Screenings   Food Insecurity: No Food Insecurity (08/18/2020)  Housing: Low Risk  (08/18/2020)  Transportation Needs: No Transportation Needs (07/14/2021)  Alcohol Screen: Low Risk  (08/18/2020)  Depression (PHQ2-9): Medium Risk (11/24/2021)  Financial Resource Strain: Low Risk  (07/14/2021)  Physical Activity: Inactive (08/18/2020)  Social Connections: Socially Isolated (08/18/2020)  Stress: No Stress Concern Present (08/18/2020)  Tobacco Use: Medium Risk (12/27/2021)   [place]  Allergies  Allergen Reactions   Bee Pollen Other (See Comments)    Seasonal allergies   Pollen Extract Other (See Comments)    Seasonal allergies    Medications Reviewed Today     Reviewed by Reyne Dumas, CMA (Certified Medical Assistant) on 12/27/21 at [place] List Status: <None>   Medication Order Taking? Sig Documenting Provider Last Dose Status  Informant  albuterol (PROAIR HFA) 108 (90 Base) MCG/ACT inhaler [id number] Yes INHALE 2 PUFFS BY MOUTH EVERY 6 HOURS AS NEEDED FOR WHEEZING Martinique, Betty G, MD Taking Active   atorvastatin (LIPITOR) 40 MG tablet [id number] Yes TAKE 1 TABLET(40 MG) BY MOUTH DAILY AT 6 PM Martinique, Betty G, MD Taking Active   benzonatate (TESSALON) 100 MG capsule [id number] Yes Take 1 capsule (100 mg total) by mouth every 8 (eight) hours. White, Fowler,  NP Taking Active   carvedilol (COREG) 25 MG tablet [id number] Yes Take 25 mg by mouth 2 (two) times daily. [provider] Taking Active Self  DULoxetine (CYMBALTA) 60 MG capsule [id number] Yes TAKE 1 CAPSULE(60 MG) BY MOUTH DAILY Martinique, Betty G, MD Taking Active   ELIQUIS 5 MG TABS tablet [id number] Yes TAKE 1 TABLET(5 MG) BY MOUTH TWICE DAILY Martinique, Betty G, MD Taking Active   famotidine (PEPCID) 20 MG tablet [id number] Yes TAKE 1 TABLET(20 MG) BY MOUTH AT BEDTIME Martinique, Betty G, MD Taking Active   furosemide (LASIX) 40 MG tablet [id number] Yes TAKE 1 TABLET(40 MG) BY MOUTH DAILY Martinique, Betty G, MD Taking Active Self  irbesartan (AVAPRO) 150 MG tablet [id number] Yes Take 0.5 tablets (75 mg total) by mouth daily. Martinique, Betty G, MD Taking Active   LamoTRIgine 200 MG TB24 24 hour tablet [id number] Yes Take 1 tablet (200 mg total) by mouth at bedtime. [name], MD Taking Active Self  memantine [facility]) 10 MG tablet [id number] Yes Take 1 tablet (10 mg total) by mouth 2 (two) times daily. [name], MD Taking Active   montelukast (SINGULAIR) 10 MG tablet [id number] Yes TAKE 1 TABLET(10 MG) BY MOUTH EVERY DAY IN THE EVENING Martinique, Betty G, MD Taking Active   naproxen (NAPROSYN) 375 MG tablet [id number] Yes Take 1 tablet (375 mg total) by mouth at bedtime as needed. For pain relief. Nyoka Lint, PA-C Taking Active   nitroGLYCERIN (NITROSTAT) 0.4 MG SL tablet [id number] Yes Place 1 tablet (0.4 mg total) under the tongue every 5 (five) minutes as needed for chest pain.  Martinique, Betty G, MD Taking Active Self           Med Note [place] May 22, 2021 11:23 AM) Pt had 1 dose pta 2.20.23  pantoprazole (PROTONIX) 40 MG tablet [id number] Yes TAKE 1 TABLET(40 MG) BY MOUTH DAILY Martinique, Betty G, MD Taking Active   promethazine-dextromethorphan (PROMETHAZINE-DM) 6.25-15 MG/5ML syrup [id number] No Take 2.5 mLs by mouth at bedtime as needed for cough.  Patient not taking: Reported on 12/27/2021   Hans Eden, NP Not Taking Active             Patient Active Problem List   Diagnosis Date Noted   Nonintractable headache 12/26/2020   Atherosclerosis of aorta ([place]) 11/21/2019   Type 2 diabetes mellitus with neurological complications ([place]) 59/74/1638   Insomnia 05/18/2019   Bilateral hand pain 02/28/2018   Muscular pain 11/15/2017   Fall 11/15/2017   Sprain of right ankle 11/15/2017   Cystocele, unspecified (CODE) 09/18/2017   Mild cognitive impairment 09/12/2017   Right flank pain 09/05/2017   Right lower quadrant abdominal pain 09/05/2017   Generalized osteoarthritis of multiple sites 01/08/2017   Renal angiomyolipoma, right 08/17/2016   Cough 07/17/2016   Genital herpes 01/24/2016   Chest pain    Coronary artery disease, occlusive: mid RCA CTO with L-R collaterals 06/08/2015   Chest wall pain 06/07/2015   Chronic anticoagulation-Eliquis 06/07/2015   CKD (chronic kidney disease), stage III ([place]) 06/07/2015   Abnormal nuclear stress test 06/07/2015   Cardiomyopathy, ischemic - suggested by Myoview 06/07/2015   Bilateral leg edema 05/31/2015   Nummular eczematous dermatitis 03/08/2015   History of pulmonary embolus (PE)-May 2016 08/20/2014   Benign essential tremor 08/20/2014   Intrinsic asthma 08/20/2014   Morbid obesity ([place]) 08/17/2014   Lumbar stenosis with neurogenic claudication 06/02/2014   Anxiety    Seizure ([place])  Essential hypertension 09/23/2012   Fibromyalgia    Cough variant asthma 09/03/2011   Osteopenia 12/19/2009    Graves' disease 10/25/2009   Depression 10/25/2009   Headache 10/25/2009   Constipation 08/15/2007   KNEE PAIN, BILATERAL 05/16/2007   Sleep difficulties 01/21/2007   Dyslipidemia, goal LDL below 70 05/30/2006   Allergic rhinitis 05/30/2006   GASTROESOPHAGEAL REFLUX, NO ESOPHAGITIS 05/30/2006    Immunization History  Administered Date(s) Administered   Fluad Quad(high Dose 65+) 01/25/2020   Influenza Whole 05/21/2007, 12/31/2007, 12/10/2010   Influenza, High Dose Seasonal PF 12/22/2012, 11/23/2016   Influenza-Unspecified 12/31/2011, 12/31/2013, 02/15/2015, 12/21/2015   PFIZER(Purple Top)SARS-COV-2 Vaccination 04/23/2019, 05/14/2019, 01/16/2020, 01/31/2020, 08/26/2020   Pneumococcal Conjugate-13 08/17/2014, 02/25/2018   Pneumococcal Polysaccharide-23 05/21/2007, 12/22/2012   Td 04/03/2003   Tdap 06/11/2019   Zoster Recombinat (Shingrix) 01/30/2021   Zoster, Live 06/30/2007   Patient has some concerns about her neuropathy and wanting to see if there is another medication she can take to help with this.   Patient reports she has started back with exercising and has been going to classes a few times a week. She started back about 2 weeks ago and is not having chest pain anymore. Patient has also started going to [facility] to take an English class to keep herself busy and she is really enjoying that.   Patient is still using the albuterol. She is still coughing at night and it's more rattling. Set up a visit with her PCP to discuss further.    Conditions to be addressed/monitored:  Hypertension, Hyperlipidemia, Diabetes, Coronary Artery Disease, GERD, Asthma, Chronic Kidney Disease, Depression, Anxiety, Osteopenia, Allergic Rhinitis, and Graves' disease  Conditions addressed this visit: Hypertension, asthma, osteopenia  Care Plan : CCM Pharmacy Care Plan  Updates made by Viona Gilmore, [place] since 01/09/2022 12:00 AM     Problem: Problem: Hypertension, Hyperlipidemia, Diabetes,  Coronary Artery Disease, GERD, Asthma, Chronic Kidney Disease, Depression, Anxiety, Osteopenia, Allergic Rhinitis, and Graves' disease      Long-Range Goal: Patient-Specific Goal   Start Date: 07/14/2021  Expected End Date: 07/15/2022  Recent Progress: On track  Priority: High  Note:   Current Barriers:  Unable to independently monitor therapeutic efficacy Unable to maintain control of blood pressure Unable to self administer medications as prescribed  Pharmacist Clinical Goal(s):  Patient will achieve adherence to monitoring guidelines and medication adherence to achieve therapeutic efficacy maintain control of blood pressure as evidenced by home and office readings  through collaboration with PharmD and provider.   Interventions: 1:1 collaboration with Martinique, Betty G, MD regarding development and update of comprehensive plan of care as evidenced by provider attestation and co-signature Inter-disciplinary care team collaboration (see longitudinal plan of care) Comprehensive medication review performed; medication list updated in electronic medical record  Hypertension (BP goal <130/80) -Not ideally controlled -Current treatment: Carvedilol 25 mg 1 tablet twice daily - Appropriate, Query effective, Safe, Accessible Irbesartan 150 mg 1/2 tablet daily - Appropriate, Query effective, Safe, Accessible -Medications previously tried: n/a  -Current home readings:  arm cuff () -Current dietary habits: cut out salt; not using a salt shaker and uses higher sodium seasoning; frozen vegetables - doesn't eat out much and only eats hamburgers if she is eating out  -Current exercise habits: not exercising as much within the last few weeks -Denies hypotensive/hypertensive symptoms -Educated on BP goals and benefits of medications for prevention of heart attack, stroke and kidney damage; Daily salt intake goal < 2300 mg; Exercise goal of 150 minutes per  week; Importance of home blood pressure  monitoring; Proper BP monitoring technique; -Counseled to monitor BP at home twice weekly, document, and provide log at future appointments -Counseled on diet and exercise extensively Recommended to continue current medication Recommended bringing BP cuff to next office visit to ensure accuracy.  Hyperlipidemia: (LDL goal < 70) -Not ideally controlled -Current treatment: Atorvastatin 40 mg 1 tablet daily - Appropriate, Query effective, Safe, Accessible -Medications previously tried: none  -Current dietary patterns: did not discuss -Current exercise habits: not exercising as much lately -Educated on Cholesterol goals;  Benefits of statin for ASCVD risk reduction; Importance of limiting foods high in cholesterol; -Counseled on diet and exercise extensively Recommended to continue current medication  CAD (Goal: prevent heart events) -Controlled -Current treatment  Nitroglycerin 0.4 mg 1 tablet as needed - Appropriate, Effective, Safe, Accessible Atorvastatin 40 mg 1 tablet daily - Appropriate, Effective, Safe, Accessible Eliquis 5 mg 1 tablet twice daily - Appropriate, Effective, Safe, Accessible -Medications previously tried: none  -Recommended checking expiration date on nitroglycerin.    Asthma (Goal: control symptoms) -Controlled -Current treatment  Albuterol HFA as needed - Appropriate, Effective, Safe, Accessible Montelukast 10 mg 1 tablet daily - Query Appropriate, Query effective, Safe, Accessible -Medications previously tried: none  -Pulmonary function testing: n/a -Exacerbations requiring treatment in last 6 months: none -Patient denies consistent use of maintenance inhaler -Frequency of rescue inhaler use: not using currently -Counseled on When to use rescue inhaler -Recommended stopping montelukast given lack of benefit.  Epilepsy (Goal: prevent seizures) -Controlled -Current treatment: Lamotrigine 200 mg 1 tablet at bedtime - Appropriate, Effective, Safe,  Accessible -Medications previously tried/failed: none -Recommended to continue current medication  Fibromyalgia (Goal: minimize pain) -Controlled -Current treatment  Duloxetine 60 mg 1 capsule daily - Appropriate, Effective, Safe, Accessible -Medications previously tried: none  -Recommended to continue current medication   Osteopenia (Goal prevent fractures) -Not ideally controlled -Last DEXA Scan: 08/2019   T-Score femoral neck: -1.8  T-Score total hip: n/a  T-Score lumbar spine: -0.2  T-Score forearm radius: n/a  10-year probability of major osteoporotic fracture: 5.6%  10-year probability of hip fracture: 1.3% -Patient is not a candidate for pharmacologic treatment -Current treatment  No medications -Medications previously tried: none  -Recommend [phone] units of vitamin D daily. Recommend 1200 mg of calcium daily from dietary and supplemental sources. Recommend weight-bearing and muscle strengthening exercises for building and maintaining bone density. -Counseled on diet and exercise extensively  Memory loss (Goal: slow progression of memory loss) -Controlled -Current treatment  Memantine 10 mg 1 tablet twice daily - Appropriate, Effective, Safe, Accessible -Medications previously tried: none  -Counseled on benefits of crossword puzzles/wordsearches, exercise and low sugar diet and impact on memory.  Pain/muscle spasms (Goal: minimize pain/muscle spasms) -Controlled -Current treatment  Methocarbamol 500 mg 1 tablet every 8 hours as needed - Appropriate, Effective, Safe, Accessible Diclofenac 1% gel as needed- Appropriate, Effective, Safe, Accessible -Medications previously tried: hydrocodone -Recommended to continue current medication  GERD (Goal: minimize symptoms) -Controlled -Current treatment  Pantoprazole 40 mg 1 tablet daily - Appropriate, Effective, Safe, Accessible Famotidine 20 mg 1 tablet at bedtime - Appropriate, Effective, Safe, Accessible -Medications  previously tried: none  -Counseled on non-pharmacologic management of symptoms such as elevating the head of your bed, avoiding eating 2-3 hours before bed, avoiding triggering foods such as acidic, spicy, or fatty foods, eating smaller meals, and wearing clothes that are loose around the waist  Swelling (Goal: minimize fluid retention) -Uncontrolled -Current treatment  Furosemide 40  mg 1 tablet daily - not taking -Medications previously tried: none  -Recommended discuss with PCP about trying alternative medication.  History of PE (Goal: prevent blood clots) -Controlled -Current treatment  Eliquis 5 mg 1 tablet twice daily - Appropriate, Effective, Safe, Accessible -Medications previously tried: none  -Recommended to continue current medication Counseled on monitoring for signs of bleeding such as unexplained and excessive bleeding from a cut or injury, easy or excessive bruising, blood in urine or stools, and nosebleeds without a known cause.   Health Maintenance -Vaccine gaps: second dose of shingrix -Current therapy:  Hydrocortisone cream 1% as needed -Educated on Cost vs benefit of each product must be carefully weighed by individual consumer -Patient is satisfied with current therapy and denies issues -Recommended to continue current medication  Patient Goals/Self-Care Activities Patient will:  - check blood pressure a few days a week, document, and provide at future appointments target a minimum of 150 minutes of moderate intensity exercise weekly  Follow Up Plan: The care management team will reach out to the patient again over the next 30 days.       Medication Assistance: None required.  Patient affirms current coverage meets needs.  Compliance/Adherence/Medication fill history: Care Gaps: Shingrix, colonoscopy, eye exam, DEXA, influenza, COVID booster Last BP - 113/72 on 12/27/2021 Last A1C - 6.0 on 08/16/2021  Star-Rating Drugs: Atorvastatin 40 mg - last filled  12/11/2021 90 DS at [facility] Irbesartan 150 mg - last filled 07/08/2021 90 DS at [facility] verified with Ronalee Belts  Patient's preferred pharmacy is:  [id number] - [place], [place] AT [address] [place] [address] Phone: [phone] Fax: [phone]  Uses pill box? Yes - weekly (fixes it) Pt endorses 99% compliance - misses dose once a month  We discussed: Benefits of medication synchronization, packaging and delivery as well as enhanced pharmacist oversight with [employer]. Patient decided to: Continue current medication management strategy  Care Plan and Follow Up Patient Decision:  Patient agrees to Care Plan and Follow-up.  Plan: The care management team will reach out to the patient again over the next 30 days.  Jeni Salles, PharmD, [place] Pharmacist [place] [facility] [place]

## 2022-01-09 NOTE — Patient Instructions (Addendum)
Ask Dr. Martinique about gabapentin or Lyrica for your never pain Try without the montelukast for a couple of weeks to see if you notice a change in allergies Bring blood pressure cuff next week Look for calcium 500 mg and vitamin D 1000 units to take every day  [place], PharmD, [place] [facility] at [place]

## 2022-01-09 NOTE — Telephone Encounter (Signed)
-----   Message from Viona Gilmore, [facility] sent at 01/09/2022 12:33 PM EDT ----- Regarding: Irbesartan refill Hi,  Can you send in a refill for her irbesartan for just the 75 mg tablets instead? She is having to split the 150 mg and the 75 mg would be a lot easier for her. She is completely out as of today.  Thanks! Maddie
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
## 2022-01-16 ENCOUNTER — Ambulatory Visit: Payer: Medicare Other | Admitting: Family Medicine
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
## 2022-01-22 ENCOUNTER — Encounter: Payer: Self-pay | Admitting: Family Medicine

## 2022-01-22 ENCOUNTER — Ambulatory Visit (INDEPENDENT_AMBULATORY_CARE_PROVIDER_SITE_OTHER): Payer: Medicare Other | Admitting: Family Medicine

## 2022-01-22 VITALS — BP 120/70 | HR 85 | Temp 98.3°F | Resp 16 | Ht 63.0 in | Wt 202.1 lb

## 2022-01-22 DIAGNOSIS — E1149 Type 2 diabetes mellitus with other diabetic neurological complication: Secondary | ICD-10-CM

## 2022-01-22 DIAGNOSIS — R202 Paresthesia of skin: Secondary | ICD-10-CM | POA: Diagnosis not present

## 2022-01-22 DIAGNOSIS — I10 Essential (primary) hypertension: Secondary | ICD-10-CM | POA: Diagnosis not present

## 2022-01-22 DIAGNOSIS — N1832 Chronic kidney disease, stage 3b: Secondary | ICD-10-CM | POA: Diagnosis not present

## 2022-01-22 DIAGNOSIS — M797 Fibromyalgia: Secondary | ICD-10-CM

## 2022-01-22 DIAGNOSIS — R200 Anesthesia of skin: Secondary | ICD-10-CM

## 2022-01-22 NOTE — Patient Instructions (Addendum)
A few things to remember from today's visit:  Type 2 diabetes mellitus with neurological complications ([place])  Essential hypertension  Fibromyalgia  Numbness and tingling of both feet  It is good that you are going to start exercising. Continue monitoring blood pressure. Good feet care.  If you need refills for medications you take chronically, please call your pharmacy. Do not use My Chart to request refills or for acute issues that need immediate attention. If you send a my chart message, it may take a few days to be addressed, specially if I am not in the office.  Please be sure medication list is accurate. If a new problem present, please set up appointment sooner than planned today.

## 2022-01-25 ENCOUNTER — Encounter: Payer: Self-pay | Admitting: Family Medicine

## 2022-01-29 DIAGNOSIS — E785 Hyperlipidemia, unspecified: Secondary | ICD-10-CM | POA: Diagnosis not present

## 2022-01-29 DIAGNOSIS — I2510 Atherosclerotic heart disease of native coronary artery without angina pectoris: Secondary | ICD-10-CM | POA: Diagnosis not present

## 2022-01-29 DIAGNOSIS — N189 Chronic kidney disease, unspecified: Secondary | ICD-10-CM | POA: Diagnosis not present

## 2022-01-29 DIAGNOSIS — I10 Essential (primary) hypertension: Secondary | ICD-10-CM | POA: Diagnosis not present

## 2022-01-29 LAB — POCT GLYCOSYLATED HEMOGLOBIN (HGB A1C): Hemoglobin A1C: 5.5 % (ref 4.0–5.6)

## 2022-01-29 NOTE — Addendum Note (Signed)
Addended by: Rodrigo Ran on: 01/29/2022 07:19 AM   Modules accepted: Orders

## 2022-01-30 DIAGNOSIS — I10 Essential (primary) hypertension: Secondary | ICD-10-CM | POA: Diagnosis not present

## 2022-01-30 DIAGNOSIS — J45909 Unspecified asthma, uncomplicated: Secondary | ICD-10-CM

## 2022-01-30 DIAGNOSIS — I2510 Atherosclerotic heart disease of native coronary artery without angina pectoris: Secondary | ICD-10-CM | POA: Diagnosis not present

## 2022-01-30 DIAGNOSIS — E785 Hyperlipidemia, unspecified: Secondary | ICD-10-CM | POA: Diagnosis not present

## 2022-02-06 ENCOUNTER — Telehealth: Payer: Self-pay | Admitting: Pharmacist

## 2022-02-06 NOTE — Chronic Care Management (AMB) (Unsigned)
Chronic Care Management Pharmacy Assistant   Name: Kelli Robeck  MRN: [id number] DOB: 12-31-1940  Reason for Encounter: Disease State / Hypertension Assessment Call   Conditions to be addressed/monitored: HTN  Recent office visits:  01/22/2022 [name] MD - Patient was seen for Type 2 diabetes mellitus with neurological complications and additional concerns. Discontinued Naproxen. Follow up in 6 months.   Recent consult visits:  None  Hospital visits:  None  Medications: Outpatient Encounter Medications as of 02/06/2022  Medication Sig Note   albuterol (PROAIR HFA) 108 (90 Base) MCG/ACT inhaler INHALE 2 PUFFS BY MOUTH EVERY 6 HOURS AS NEEDED FOR WHEEZING    atorvastatin (LIPITOR) 40 MG tablet TAKE 1 TABLET(40 MG) BY MOUTH DAILY AT 6 PM    benzonatate (TESSALON) 100 MG capsule Take 1 capsule (100 mg total) by mouth every 8 (eight) hours.    carvedilol (COREG) 25 MG tablet Take 25 mg by mouth 2 (two) times daily.    DULoxetine (CYMBALTA) 60 MG capsule TAKE 1 CAPSULE(60 MG) BY MOUTH DAILY    ELIQUIS 5 MG TABS tablet TAKE 1 TABLET(5 MG) BY MOUTH TWICE DAILY    famotidine (PEPCID) 20 MG tablet TAKE 1 TABLET(20 MG) BY MOUTH AT BEDTIME    furosemide (LASIX) 40 MG tablet TAKE 1 TABLET(40 MG) BY MOUTH DAILY (Patient taking differently: Take 40 mg by mouth as needed for fluid.)    irbesartan (AVAPRO) 75 MG tablet Take 1 tablet (75 mg total) by mouth daily.    LamoTRIgine 200 MG TB24 24 hour tablet Take 1 tablet (200 mg total) by mouth at bedtime.    memantine (NAMENDA) 10 MG tablet Take 1 tablet (10 mg total) by mouth 2 (two) times daily.    montelukast (SINGULAIR) 10 MG tablet TAKE 1 TABLET(10 MG) BY MOUTH EVERY DAY IN THE EVENING    nitroGLYCERIN (NITROSTAT) 0.4 MG SL tablet Place 1 tablet (0.4 mg total) under the tongue every 5 (five) minutes as needed for chest pain. 05/22/2021: Pt had 1 dose pta 2.20.23   pantoprazole (PROTONIX) 40 MG tablet TAKE 1 TABLET(40 MG) BY  MOUTH DAILY    promethazine-dextromethorphan (PROMETHAZINE-DM) 6.25-15 MG/5ML syrup Take 2.5 mLs by mouth at bedtime as needed for cough.    No facility-administered encounter medications on file as of 02/06/2022.  Fill History:   Dispensed Days Supply Quantity Provider Pharmacy  ALBUTEROL HFA INH (200 PUFFS) 8.5GM 09/27/2021 25       Dispensed Days Supply Quantity Provider Pharmacy  ELIQUIS '5MG'$  TABLETS 12/12/2021 90       Dispensed Days Supply Quantity Provider Pharmacy  ATORVASTATIN '40MG'$  TABLETS 12/11/2021 90       Dispensed Days Supply Quantity Provider Pharmacy  CARVEDILOL '25MG'$  TABLETS 12/14/2021 90       Dispensed Days Supply Quantity Provider Pharmacy  DULOXETINE DR '60MG'$  CAPSULES 10/30/2021 90       Dispensed Days Supply Quantity Provider Pharmacy  FAMOTIDINE '20MG'$  TABLETS 01/13/2022 90       Dispensed Days Supply Quantity Provider Pharmacy  FUROSEMIDE '40MG'$  TABLETS 11/07/2021 30       Dispensed Days Supply Quantity Provider Pharmacy  IRBESARTAN '75MG'$  TABLETS 01/09/2022 90       Dispensed Days Supply Quantity Provider Pharmacy  LAMOTRIGINE ER '200MG'$  TABLETS 01/13/2022 90       Dispensed Days Supply Quantity Provider Pharmacy  MONTELUKAST '10MG'$  TABLETS 12/11/2021 90       Dispensed Days Supply Quantity Provider Pharmacy  NITROGLYCERIN 0.'4MG'$  SUB TAB 25S 10/30/2021  5       Dispensed Days Supply Quantity Provider Pharmacy  PANTOPRAZOLE '40MG'$  TABLETS 12/13/2021 90      Reviewed chart prior to disease state call. Spoke with patient regarding BP  Recent Office Vitals: BP Readings from Last 3 Encounters:  01/22/22 120/70  01/09/22 (!) 140/78  12/27/21 113/72   Pulse Readings from Last 3 Encounters:  01/22/22 85  12/27/21 71  12/06/21 80    Wt Readings from Last 3 Encounters:  01/22/22 202 lb 2 oz (91.7 kg)  12/27/21 200 lb (90.7 kg)  12/06/21 200 lb (90.7 kg)     Kidney Function Lab Results  Component Value Date/Time   CREATININE 1.47 (H) 08/30/2021 03:39 PM    CREATININE 1.51 (H) 06/10/2021 10:35 AM   CREATININE 1.54 (H) 11/18/2019 10:08 AM   CREATININE 1.09 01/14/2014 10:19 AM   GFR 37.74 (L) 02/06/2021 09:58 AM   GFRNONAA 36 (L) 08/30/2021 03:39 PM   GFRNONAA 32 (L) 11/18/2019 10:08 AM   GFRAA 37 (L) 11/18/2019 10:08 AM       Latest Ref Rng & Units 08/30/2021    3:39 PM 06/10/2021   10:35 AM 05/22/2021    9:15 AM  BMP  Glucose 70 - 99 mg/dL 102  106  149   BUN 8 - 23 mg/dL '16  19  20   '$ Creatinine 0.44 - 1.00 mg/dL 1.47  1.51  1.59   Sodium 135 - 145 mmol/L 136  137  135   Potassium 3.5 - 5.1 mmol/L 4.8  4.1  3.9   Chloride 98 - 111 mmol/L 106  103  103   CO2 22 - 32 mmol/L '24  25  23   '$ Calcium 8.9 - 10.3 mg/dL 9.0  9.1  8.9    SCHEDULE FOLLOW UP APR, SOONER IF PT CAN BRING IN CUFF FOR COMPARISON Current antihypertensive regimen:  Carvedilol 25 mg twice daily Irbesartan 75 mg daily  How often are you checking your Blood Pressure? {CHL HP BP Monitoring Frequency:[phone]}  Current home BP readings: ***  What recent interventions/DTPs have been made by any provider to improve Blood Pressure control since last CPP Visit: Recent change in Irbesartan, decreased from 150 mg daily to 75 mg daily.   Any recent hospitalizations or ED visits since last visit with CPP? No recent hospital visits.   What diet changes have been made to improve Blood Pressure Control?  Patient follows [place] exercise is being done to improve your Blood Pressure Control?  ***  Adherence Review: Is the patient currently on ACE/ARB medication? Yes Does the patient have >5 day gap between last estimated fill dates? No  Care Gaps: AWV - 11/27/21 message to [name] Last BP - 120/70 on 01/22/2022 Last A1C - 5.5 on 01/22/2022 Covid - overdue Colonoscopy - overdue Eye exam - overdue Dexascan - overdue AWV - overdue Flu - due Foot exam - overdue Shingrix - postponed  Star Rating Drugs: Atorvastatin 40 mg - last  filled 12/11/2021 90 DS at [facility] Irbesartan 75 mg - last filled 01/09/2022 90 DS at [facility] Pharmacist Assistant [phone]

## 2022-02-13 ENCOUNTER — Encounter (HOSPITAL_COMMUNITY): Payer: Self-pay | Admitting: *Deleted

## 2022-02-13 ENCOUNTER — Ambulatory Visit (HOSPITAL_COMMUNITY)
Admission: EM | Admit: 2022-02-13 | Discharge: 2022-02-13 | Disposition: A | Discharge status: Home / Self Care | Payer: Medicare Other | Attending: Physician Assistant | Admitting: Physician Assistant

## 2022-02-13 DIAGNOSIS — R42 Dizziness and giddiness: Secondary | ICD-10-CM | POA: Diagnosis not present

## 2022-02-13 DIAGNOSIS — I890 Lymphedema, not elsewhere classified: Secondary | ICD-10-CM | POA: Diagnosis not present

## 2022-02-13 LAB — CBC WITH DIFFERENTIAL/PLATELET
Abs Immature Granulocytes: 0.01 10*3/uL (ref 0.00–0.07)
Basophils Absolute: 0 10*3/uL (ref 0.0–0.1)
Basophils Relative: 1 %
Eosinophils Absolute: 0.1 10*3/uL (ref 0.0–0.5)
Eosinophils Relative: 1 %
HCT: 44 % (ref 36.0–46.0)
Hemoglobin: 14.2 g/dL (ref 12.0–15.0)
Immature Granulocytes: 0 %
Lymphocytes Relative: 39 %
Lymphs Abs: 2.5 10*3/uL (ref 0.7–4.0)
MCH: 29.4 pg (ref 26.0–34.0)
MCHC: 32.3 g/dL (ref 30.0–36.0)
MCV: 91.1 fL (ref 80.0–100.0)
Monocytes Absolute: 0.6 10*3/uL (ref 0.1–1.0)
Monocytes Relative: 9 %
Neutro Abs: 3.3 10*3/uL (ref 1.7–7.7)
Neutrophils Relative %: 50 %
Platelets: 186 10*3/uL (ref 150–400)
RBC: 4.83 MIL/uL (ref 3.87–5.11)
RDW: 14.1 % (ref 11.5–15.5)
WBC: 6.5 10*3/uL (ref 4.0–10.5)
nRBC: 0 % (ref 0.0–0.2)

## 2022-02-13 LAB — COMPREHENSIVE METABOLIC PANEL
ALT: 17 U/L (ref 0–44)
AST: 18 U/L (ref 15–41)
Albumin: 3.4 g/dL — ABNORMAL LOW (ref 3.5–5.0)
Alkaline Phosphatase: 96 U/L (ref 38–126)
Anion gap: 7 (ref 5–15)
BUN: 16 mg/dL (ref 8–23)
CO2: 27 mmol/L (ref 22–32)
Calcium: 9.4 mg/dL (ref 8.9–10.3)
Chloride: 107 mmol/L (ref 98–111)
Creatinine, Ser: 1.35 mg/dL — ABNORMAL HIGH (ref 0.44–1.00)
GFR, Estimated: 40 mL/min — ABNORMAL LOW (ref >60)
Glucose, Bld: 105 mg/dL — ABNORMAL HIGH (ref 70–99)
Potassium: 4.4 mmol/L (ref 3.5–5.1)
Sodium: 141 mmol/L (ref 135–145)
Total Bilirubin: 0.5 mg/dL (ref 0.3–1.2)
Total Protein: 7.5 g/dL (ref 6.5–8.1)

## 2022-02-13 MED ORDER — MECLIZINE HCL 25 MG PO TABS: 12.5000 mg | ORAL_TABLET | Freq: Two times a day (BID) | ORAL | 0 refills | Status: DC | PRN

## 2022-02-13 NOTE — Discharge Instructions (Addendum)
I have ordered labs to help determine if kidney function, electrolytes, hemoglobin, etc are within normal limits or possibly contributing to symptoms.   Trial meclizine for possible vertigo.   Recommend follow up with primary care- they may want to order ECHO if you are concerned for fluid around your heart.

## 2022-02-13 NOTE — ED Triage Notes (Addendum)
Pt states she has bilateral leg pain X 1 week She does have lymphedema but her legs are worse x 1 week. She has left shoulder pain for the same amount of time. She wanted to know if we could see if she has fluid around her heart her BP was 141/91 three days ago. She is dizzy and that started a week ago too.
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
## 2022-02-20 ENCOUNTER — Encounter: Payer: Self-pay | Admitting: Family Medicine

## 2022-02-20 ENCOUNTER — Ambulatory Visit (INDEPENDENT_AMBULATORY_CARE_PROVIDER_SITE_OTHER): Payer: Medicare Other | Admitting: Family Medicine

## 2022-02-20 VITALS — BP 124/70 | HR 87 | Temp 98.8°F | Resp 16 | Ht 63.0 in | Wt 202.1 lb

## 2022-02-20 DIAGNOSIS — I10 Essential (primary) hypertension: Secondary | ICD-10-CM

## 2022-02-20 DIAGNOSIS — R42 Dizziness and giddiness: Secondary | ICD-10-CM | POA: Diagnosis not present

## 2022-02-20 NOTE — Patient Instructions (Addendum)
A few things to remember from today's visit:  Dizziness  Essential hypertension  Fall prevention and adequate hydration. Move slowly. Stop Meclizine.  If dizziness does not improve, please arrange follow up appt with your cardiologist and neurologist.  If you need refills for medications you take chronically, please call your pharmacy. Do not use My Chart to request refills or for acute issues that need immediate attention. If you send a my chart message, it may take a few days to be addressed, specially if I am not in the office.  Please be sure medication list is accurate. If a new problem present, please set up appointment sooner than planned today.

## 2022-02-20 NOTE — Assessment & Plan Note (Signed)
Some SBP readings have been mildly elevated but most between 120-130's. Continue carvedilol  25 mg twice daily and  Avapro 75 mg daily. Low salt diet. Continue monitoring BP regularly.

## 2022-02-20 NOTE — Assessment & Plan Note (Addendum)
This seems to be a chronic problem, hx does not suggest vertigo. Hx and examination today do not suggest a serious process. Brain MRI on 02/13/2021 done because of dizziness was negative for acute intracranial abnormality.  Chronically advanced small vessel disease, only mild progression since 2015. Labs from recent ED visit showed with no significant abnormalities. I do not think further work-up is needed at this time. Encourage adequate hydration and fall prevention measures. Advise the patient to move slowly and carefully, especially when getting out of bed. Discontinue Meclizine as it is not helping and may increase the risk of falls. Recommend discussing dizziness with her neurologist and cardiologist during their next appointments. If dizziness worsens, consider arranging an earlier appointment with the neurologist.

## 2022-03-23 ENCOUNTER — Other Ambulatory Visit: Payer: Self-pay | Admitting: Family Medicine

## 2022-03-27 ENCOUNTER — Telehealth: Payer: Self-pay | Admitting: Family Medicine

## 2022-03-27 NOTE — Telephone Encounter (Signed)
Left message for patient to call back and schedule Medicare Annual Wellness Visit (AWV) either virtually or in office. Left  my [name] number [phone]   Last AWV   08/18/20 please schedule with Nurse Health Adviser   45 min for awv-i and in office appointments 30 min for awv-s  phone/virtual appointments

## 2022-04-04 ENCOUNTER — Telehealth: Payer: Self-pay | Admitting: Pharmacist

## 2022-04-04 ENCOUNTER — Other Ambulatory Visit: Payer: Self-pay | Admitting: Family Medicine

## 2022-04-04 DIAGNOSIS — Z1231 Encounter for screening mammogram for malignant neoplasm of breast: Secondary | ICD-10-CM

## 2022-04-04 NOTE — Progress Notes (Signed)
Care Coordination Pharmacy Assistant   Name: Marshea Wisher  MRN: [id number] DOB: [birth year]  Reason for Encounter: Hypertension / Hypertension Assessment Call   Recent office visits:  02/20/2022 [name] MD - Patient was seen for Dizziness and an additional concern. Discontinued Meclizine.   Recent consult visits:  None  Hospital visits:  Patient was seen at [facility] on 02/13/2022 (1 hour) due to dizziness and lymphedema. New?Medications Started at [facility] Discharge:?? Started Meclizine 12.5 mg twice daily as needed Medication Changes at Hospital Discharge: No medication changes Medications Discontinued at Hospital Discharge: No medications discontinued Medications that remain the same after Hospital Discharge:??  -All other medications will remain the same.    Medications: Outpatient Encounter Medications as of 04/04/2022  Medication Sig   albuterol (PROAIR HFA) 108 (90 Base) MCG/ACT inhaler INHALE 2 PUFFS BY MOUTH EVERY 6 HOURS AS NEEDED FOR WHEEZING   atorvastatin (LIPITOR) 40 MG tablet TAKE 1 TABLET(40 MG) BY MOUTH DAILY AT 6 PM   benzonatate (TESSALON) 100 MG capsule Take 1 capsule (100 mg total) by mouth every 8 (eight) hours.   carvedilol (COREG) 25 MG tablet Take 25 mg by mouth 2 (two) times daily.   DULoxetine (CYMBALTA) 60 MG capsule TAKE 1 CAPSULE(60 MG) BY MOUTH DAILY   ELIQUIS 5 MG TABS tablet TAKE 1 TABLET(5 MG) BY MOUTH TWICE DAILY   famotidine (PEPCID) 20 MG tablet TAKE 1 TABLET(20 MG) BY MOUTH AT BEDTIME   furosemide (LASIX) 40 MG tablet TAKE 1 TABLET(40 MG) BY MOUTH DAILY (Patient taking differently: Take 40 mg by mouth as needed for fluid.)   irbesartan (AVAPRO) 75 MG tablet Take 1 tablet (75 mg total) by mouth daily.   LamoTRIgine 200 MG TB24 24 hour tablet Take 1 tablet (200 mg total) by mouth at bedtime.   memantine (NAMENDA) 10 MG tablet Take 1 tablet (10 mg total) by mouth 2 (two) times daily.   montelukast (SINGULAIR) 10 MG  tablet TAKE 1 TABLET(10 MG) BY MOUTH EVERY DAY IN THE EVENING   nitroGLYCERIN (NITROSTAT) 0.4 MG SL tablet PLACE 1 TABLET UNDER TONGUE EVERY 5 MINUTES AS NEEDED FOR CHEST PAIN   pantoprazole (PROTONIX) 40 MG tablet TAKE 1 TABLET(40 MG) BY MOUTH DAILY   promethazine-dextromethorphan (PROMETHAZINE-DM) 6.25-15 MG/5ML syrup Take 2.5 mLs by mouth at bedtime as needed for cough.   No facility-administered encounter medications on file as of 04/04/2022.    Recent Office Vitals: BP Readings from Last 3 Encounters:  02/20/22 124/70  02/13/22 138/77  01/22/22 120/70   Pulse Readings from Last 3 Encounters:  02/20/22 87  02/13/22 66  01/22/22 85    Wt Readings from Last 3 Encounters:  02/20/22 202 lb 2 oz (91.7 kg)  01/22/22 202 lb 2 oz (91.7 kg)  12/27/21 200 lb (90.7 kg)     Kidney Function Lab Results  Component Value Date/Time   CREATININE 1.35 (H) 02/13/2022 03:21 PM   CREATININE 1.47 (H) 08/30/2021 03:39 PM   CREATININE 1.54 (H) 11/18/2019 10:08 AM   CREATININE 1.09 01/14/2014 10:19 AM   GFR 37.74 (L) 02/06/2021 09:58 AM   GFRNONAA 40 (L) 02/13/2022 03:21 PM   GFRNONAA 32 (L) 11/18/2019 10:08 AM   GFRAA 37 (L) 11/18/2019 10:08 AM       Latest Ref Rng & Units 02/13/2022    3:21 PM 08/30/2021    3:39 PM 06/10/2021   10:35 AM  BMP  Glucose 70 - 99 mg/dL 105  102  106  BUN 8 - 23 mg/dL '16  16  19   '$ Creatinine 0.44 - 1.00 mg/dL 1.35  1.47  1.51   Sodium 135 - 145 mmol/L 141  136  137   Potassium 3.5 - 5.1 mmol/L 4.4  4.8  4.1   Chloride 98 - 111 mmol/L 107  106  103   CO2 22 - 32 mmol/L '27  24  25   '$ Calcium 8.9 - 10.3 mg/dL 9.4  9.0  9.1   Fill History:   Dispensed Days Supply Quantity Provider Pharmacy  ALBUTEROL HFA INH (200 PUFFS) 8.5GM 09/27/2021 25 8.5 g      Dispensed Days Supply Quantity Provider Pharmacy  ELIQUIS '5MG'$  TABLETS 03/13/2022 90 180 each      Dispensed Days Supply Quantity Provider Pharmacy  ATORVASTATIN '40MG'$  TABLETS 03/13/2022 90 90 each       Dispensed Days Supply Quantity Provider Pharmacy  BENZONATATE '100MG'$  CAPSULES 06/06/2021 10 30 each      Dispensed Days Supply Quantity Provider Pharmacy  CARVEDILOL '25MG'$  TABLETS 03/13/2022 90 180 each      Dispensed Days Supply Quantity Provider Pharmacy  DULOXETINE DR '60MG'$  CAPSULES 02/20/2022 90 90 each      Dispensed Days Supply Quantity Provider Pharmacy  FAMOTIDINE '20MG'$  TABLETS 01/13/2022 90 90 each      Dispensed Days Supply Quantity Provider Pharmacy  FUROSEMIDE '40MG'$  TABLETS 11/07/2021 30 30 each      Dispensed Days Supply Quantity Provider Pharmacy  IRBESARTAN '75MG'$  TABLETS 01/09/2022 90 90 each      Dispensed Days Supply Quantity Provider Pharmacy  LAMOTRIGINE ER '200MG'$  TABLETS 01/13/2022 90 90 each      Dispensed Days Supply Quantity Provider Pharmacy  MEMANTINE '10MG'$  TABLETS 01/18/2022 90 180 each      Dispensed Days Supply Quantity Provider Pharmacy  MONTELUKAST '10MG'$  TABLETS 03/13/2022 90 90 each      Dispensed Days Supply Quantity Provider Pharmacy  NITROGLYCERIN 0.'4MG'$  SUB TABS 25S 03/27/2022 7 25 each      Dispensed Days Supply Quantity Provider Pharmacy  PANTOPRAZOLE '40MG'$  TABLETS 03/13/2022 90 90 each     Current antihypertensive regimen:  Carvedilol 25 mg twice daily Irbesartan 75 mg daily  How often are you checking your Blood Pressure?   she checks her blood pressure   taking her medication.  Current home BP readings:   DATE:             BP               PULSE   Wrist or arm cuff:  Caffeine intake:  Salt intake:  OTC medications including pseudoephedrine or NSAIDs?  Any readings above 180/120?  If yes any symptoms of hypertensive emergency?   What recent interventions/DTPs have been made by any provider to improve Blood Pressure control since last CPP Visit: No recent interventions  Any recent hospitalizations or ED visits since last visit with CPP? Patient was seen at [facility] on 02/13/2022 (1 hour) due to dizziness and  lymphedema.  What diet changes have been made to improve Blood Pressure Control?    What exercise is being done to improve your Blood Pressure Control?    Adherence Review: Is the patient currently on ACE/ARB medication? Yes Does the patient have >5 day gap between last estimated fill dates? No  Unable to contact patient after several attempts to discuss hypertension disease state.  Care Gaps: AWV - 11/27/21 message to [name] Last BP - 124/70 on 02/20/2022 Last A1C -  5.5 on 01/22/2022 Urine ACR - overdue Colonoscopy - overdue Eye exam - overdue Dexascan - overdue AWV - overdue Covid - overdue Foot exam - overdue Shingirx - posponed Flu - postponed  Star Rating Drugs:  Atorvastatin 40 mg - last filled 03/13/2022 90 DS at [facility] Irbesartan 75 mg - last filled 01/09/2022 90 DS at [facility] Pharmacist Assistant [phone]

## 2022-04-20 ENCOUNTER — Encounter: Payer: Self-pay | Admitting: Internal Medicine

## 2022-04-20 ENCOUNTER — Ambulatory Visit (INDEPENDENT_AMBULATORY_CARE_PROVIDER_SITE_OTHER): Payer: 59 | Admitting: Internal Medicine

## 2022-04-20 ENCOUNTER — Ambulatory Visit (INDEPENDENT_AMBULATORY_CARE_PROVIDER_SITE_OTHER): Payer: 59

## 2022-04-20 ENCOUNTER — Ambulatory Visit: Payer: Medicare Other | Admitting: Family Medicine

## 2022-04-20 VITALS — BP 120/80 | HR 75 | Temp 97.9°F | Ht 63.0 in | Wt 200.0 lb

## 2022-04-20 DIAGNOSIS — E1149 Type 2 diabetes mellitus with other diabetic neurological complication: Secondary | ICD-10-CM | POA: Diagnosis not present

## 2022-04-20 DIAGNOSIS — G8929 Other chronic pain: Secondary | ICD-10-CM

## 2022-04-20 DIAGNOSIS — M25551 Pain in right hip: Secondary | ICD-10-CM | POA: Diagnosis not present

## 2022-04-20 DIAGNOSIS — M545 Low back pain, unspecified: Secondary | ICD-10-CM | POA: Diagnosis not present

## 2022-04-20 DIAGNOSIS — Z86711 Personal history of pulmonary embolism: Secondary | ICD-10-CM | POA: Diagnosis not present

## 2022-04-20 DIAGNOSIS — M5441 Lumbago with sciatica, right side: Secondary | ICD-10-CM | POA: Diagnosis not present

## 2022-04-20 DIAGNOSIS — M25552 Pain in left hip: Secondary | ICD-10-CM

## 2022-04-20 MED ORDER — PREDNISONE 20 MG PO TABS: 40.0000 mg | ORAL_TABLET | Freq: Every day | ORAL | 0 refills | Status: AC

## 2022-04-20 NOTE — Assessment & Plan Note (Addendum)
New right leg symptoms suspect from low back etiology. Rx prednisone 3 day course and checking lumbar x-ray and bilateral hip x-ray.Can use tylenol otc and no nsaids.

## 2022-04-20 NOTE — Assessment & Plan Note (Signed)
Given her lifelong eliquis 5 mg BID she is not a candidate for NSAID treatment or toradol.

## 2022-04-20 NOTE — Progress Notes (Signed)
   Subjective:   Patient ID: [name], female    DOB: April 19, 1940, 82 y.o.   MRN: [id number]  Leg Pain    The patient is an 82 YO female coming in for low back and right leg last 2-3 days. Also chronic left hip pain.  Review of Systems  Constitutional:  Positive for activity change. Negative for appetite change, chills, fatigue, fever and unexpected weight change.  Respiratory: Negative.    Cardiovascular:  Positive for leg swelling.  Gastrointestinal: Negative.   Musculoskeletal:  Positive for arthralgias, back pain and myalgias. Negative for gait problem and joint swelling.  Skin: Negative.   Neurological: Negative.     Objective:  Physical Exam Constitutional:      Appearance: She is well-developed.  HENT:     Head: Normocephalic and atraumatic.  Cardiovascular:     Rate and Rhythm: Normal rate and regular rhythm.  Pulmonary:     Effort: Pulmonary effort is normal. No respiratory distress.     Breath sounds: Normal breath sounds. No wheezing or rales.  Abdominal:     General: Bowel sounds are normal. There is no distension.     Palpations: Abdomen is soft.     Tenderness: There is no abdominal tenderness. There is no rebound.  Musculoskeletal:        General: Tenderness present.     Cervical back: Normal range of motion.     Right lower leg: Edema present.     Left lower leg: Edema present.  Skin:    General: Skin is warm and dry.     Comments: Foot exam done  Neurological:     Mental Status: She is alert and oriented to person, place, and time.     Coordination: Coordination normal.     Vitals:   04/20/22 1103  BP: 120/80  Pulse: 75  Temp: 97.9 F (36.6 C)  TempSrc: Oral  SpO2: 96%  Weight: 200 lb (90.7 kg)  Height: '5\' 3"'$  (1.6 m)    Assessment & Plan:

## 2022-04-20 NOTE — Patient Instructions (Signed)
We have sent in prednisone to take 2 pills daily for 3 days.  We will check the x-rays of the hips and low back.

## 2022-04-20 NOTE — Assessment & Plan Note (Signed)
Checking x-ray bilateral hips as she is having chronic intermittent left groin pain. She is allowed to take tylenol otc and is not allowed to take nsaids given eliquis for past PE.

## 2022-04-20 NOTE — Assessment & Plan Note (Addendum)
Foot exam done as already examining legs. Last HgA1c 5.5 reviewed and acceptable for short course prednisone.

## 2022-04-23 ENCOUNTER — Ambulatory Visit: Payer: Medicare Other | Admitting: Physician Assistant

## 2022-05-09 DIAGNOSIS — R109 Unspecified abdominal pain: Secondary | ICD-10-CM | POA: Diagnosis not present

## 2022-05-09 DIAGNOSIS — I2510 Atherosclerotic heart disease of native coronary artery without angina pectoris: Secondary | ICD-10-CM | POA: Diagnosis not present

## 2022-05-09 DIAGNOSIS — E785 Hyperlipidemia, unspecified: Secondary | ICD-10-CM | POA: Diagnosis not present

## 2022-05-09 DIAGNOSIS — I10 Essential (primary) hypertension: Secondary | ICD-10-CM | POA: Diagnosis not present

## 2022-05-10 DIAGNOSIS — Z86711 Personal history of pulmonary embolism: Secondary | ICD-10-CM | POA: Diagnosis not present

## 2022-05-10 DIAGNOSIS — E785 Hyperlipidemia, unspecified: Secondary | ICD-10-CM | POA: Diagnosis not present

## 2022-05-10 DIAGNOSIS — I10 Essential (primary) hypertension: Secondary | ICD-10-CM | POA: Diagnosis not present

## 2022-05-18 ENCOUNTER — Ambulatory Visit: Payer: 59 | Admitting: Family Medicine
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
## 2022-05-19 ENCOUNTER — Other Ambulatory Visit: Payer: Self-pay | Admitting: Family Medicine

## 2022-05-19 DIAGNOSIS — M48062 Spinal stenosis, lumbar region with neurogenic claudication: Secondary | ICD-10-CM

## 2022-05-19 DIAGNOSIS — M797 Fibromyalgia: Secondary | ICD-10-CM

## 2022-05-21 ENCOUNTER — Ambulatory Visit (INDEPENDENT_AMBULATORY_CARE_PROVIDER_SITE_OTHER): Payer: 59 | Admitting: Family Medicine

## 2022-05-21 ENCOUNTER — Encounter: Payer: Self-pay | Admitting: Family Medicine

## 2022-05-21 VITALS — BP 124/80 | HR 94 | Resp 16 | Ht 63.0 in | Wt 200.0 lb

## 2022-05-21 DIAGNOSIS — R569 Unspecified convulsions: Secondary | ICD-10-CM | POA: Diagnosis not present

## 2022-05-21 DIAGNOSIS — I700 Atherosclerosis of aorta: Secondary | ICD-10-CM

## 2022-05-21 DIAGNOSIS — J309 Allergic rhinitis, unspecified: Secondary | ICD-10-CM

## 2022-05-21 DIAGNOSIS — R0989 Other specified symptoms and signs involving the circulatory and respiratory systems: Secondary | ICD-10-CM

## 2022-05-21 DIAGNOSIS — N1832 Chronic kidney disease, stage 3b: Secondary | ICD-10-CM

## 2022-05-21 DIAGNOSIS — I10 Essential (primary) hypertension: Secondary | ICD-10-CM | POA: Diagnosis not present

## 2022-05-21 DIAGNOSIS — K219 Gastro-esophageal reflux disease without esophagitis: Secondary | ICD-10-CM

## 2022-05-21 DIAGNOSIS — M25552 Pain in left hip: Secondary | ICD-10-CM | POA: Diagnosis not present

## 2022-05-21 NOTE — Assessment & Plan Note (Signed)
Asymptomatic at this time. Continue Singulair 10 mg daily during seasons symptoms are worse, she can resume medication now to help with Spring allergies.

## 2022-05-21 NOTE — Assessment & Plan Note (Signed)
Problem has been well controlled. Continue Lamictal 200 mg daily. Following with neurologist.

## 2022-05-21 NOTE — Assessment & Plan Note (Signed)
BP adequately controlled. Continue carvedilol  25 mg twice daily and  Avapro 75 mg daily as well as low salt diet. Continue monitoring BP regularly.

## 2022-05-21 NOTE — Assessment & Plan Note (Addendum)
BMI 35.4. Co-morbilities: DM II,HLD,atrial fib,GERD,and depression.  Wt has been stable. Consistency with healthy diet and low impact physical activity encouraged.

## 2022-05-21 NOTE — Assessment & Plan Note (Signed)
We discussed possible etiologies: OA, radicular pain among some. No hernia appreciated on examination. She prefers to hold on PT, stretching exercises at home as tolerated. Tylenol 1000 mg at bedtime recommended. If not resolved, ortho referral can be considered.

## 2022-05-21 NOTE — Assessment & Plan Note (Signed)
She has not had symptoms kin some time, so she can stop Famotidine. Continue Protonix 40 mg daily and GERD precautions.

## 2022-05-21 NOTE — Patient Instructions (Addendum)
A few things to remember from today's visit:  Decreased dorsalis pedis pulse  Pain of left hip  Essential hypertension  Let's try to stop Famotidine and continue Protonix. If no acid reflux, we could try to decrease Protonix. For pain you can take Tylenol 1000 mg at bedtime. Stretching exercises as tolerated. We could consider physico therapy and lastly ortho evaluation of pain is not any better.   If you need refills for medications you take chronically, please call your pharmacy. Do not use My Chart to request refills or for acute issues that need immediate attention. If you send a my chart message, it may take a few days to be addressed, specially if I am not in the office.  Please be sure medication list is accurate. If a new problem present, please set up appointment sooner than planned today.

## 2022-05-21 NOTE — Assessment & Plan Note (Signed)
It has been seen on abdominal CT 08/2017. Continue atorvastatin 40 mg daily. On chronic anticoagulation.

## 2022-05-21 NOTE — Assessment & Plan Note (Signed)
01/2022 Cr 1.3 and e GFR 40. Continue adequate hydration,avoidance of NSAID's, and low-salt diet. Following with nephrologist.

## 2022-05-28 ENCOUNTER — Encounter (HOSPITAL_COMMUNITY): Payer: Self-pay

## 2022-05-28 ENCOUNTER — Ambulatory Visit (HOSPITAL_COMMUNITY)
Admission: EM | Admit: 2022-05-28 | Discharge: 2022-05-28 | Disposition: A | Discharge status: Home / Self Care | Payer: 59 | Attending: Internal Medicine | Admitting: Internal Medicine

## 2022-05-28 ENCOUNTER — Ambulatory Visit
Admission: RE | Admit: 2022-05-28 | Discharge: 2022-05-28 | Disposition: A | Discharge status: Home / Self Care | Payer: 59 | Source: Ambulatory Visit | Attending: Family Medicine | Admitting: Family Medicine

## 2022-05-28 DIAGNOSIS — Z1231 Encounter for screening mammogram for malignant neoplasm of breast: Secondary | ICD-10-CM

## 2022-05-28 DIAGNOSIS — R42 Dizziness and giddiness: Secondary | ICD-10-CM | POA: Diagnosis not present

## 2022-05-28 DIAGNOSIS — E860 Dehydration: Secondary | ICD-10-CM | POA: Diagnosis not present

## 2022-05-28 DIAGNOSIS — W19XXXA Unspecified fall, initial encounter: Secondary | ICD-10-CM | POA: Diagnosis not present

## 2022-05-28 DIAGNOSIS — N1832 Chronic kidney disease, stage 3b: Secondary | ICD-10-CM | POA: Insufficient documentation

## 2022-05-28 DIAGNOSIS — I509 Heart failure, unspecified: Secondary | ICD-10-CM | POA: Diagnosis not present

## 2022-05-28 DIAGNOSIS — E1122 Type 2 diabetes mellitus with diabetic chronic kidney disease: Secondary | ICD-10-CM | POA: Diagnosis not present

## 2022-05-28 DIAGNOSIS — W010XXA Fall on same level from slipping, tripping and stumbling without subsequent striking against object, initial encounter: Secondary | ICD-10-CM | POA: Diagnosis not present

## 2022-05-28 LAB — POCT URINALYSIS DIPSTICK, ED / UC
Glucose, UA: NEGATIVE mg/dL
Hgb urine dipstick: NEGATIVE
Leukocytes,Ua: NEGATIVE
Nitrite: NEGATIVE
Protein, ur: 30 mg/dL — AB
Specific Gravity, Urine: >=1.03 (ref 1.005–1.030)
Urobilinogen, UA: 1 mg/dL (ref 0.0–1.0)
pH: 5.5 (ref 5.0–8.0)

## 2022-05-28 LAB — COMPREHENSIVE METABOLIC PANEL
ALT: 23 U/L (ref 0–44)
AST: 23 U/L (ref 15–41)
Albumin: 3.4 g/dL — ABNORMAL LOW (ref 3.5–5.0)
Alkaline Phosphatase: 100 U/L (ref 38–126)
Anion gap: 10 (ref 5–15)
BUN: 23 mg/dL (ref 8–23)
CO2: 25 mmol/L (ref 22–32)
Calcium: 9.6 mg/dL (ref 8.9–10.3)
Chloride: 104 mmol/L (ref 98–111)
Creatinine, Ser: 1.6 mg/dL — ABNORMAL HIGH (ref 0.44–1.00)
GFR, Estimated: 32 mL/min — ABNORMAL LOW (ref >60)
Glucose, Bld: 109 mg/dL — ABNORMAL HIGH (ref 70–99)
Potassium: 4.5 mmol/L (ref 3.5–5.1)
Sodium: 139 mmol/L (ref 135–145)
Total Bilirubin: 0.5 mg/dL (ref 0.3–1.2)
Total Protein: 7.3 g/dL (ref 6.5–8.1)

## 2022-05-28 LAB — CBC
HCT: 45.4 % (ref 36.0–46.0)
Hemoglobin: 14.2 g/dL (ref 12.0–15.0)
MCH: 28.8 pg (ref 26.0–34.0)
MCHC: 31.3 g/dL (ref 30.0–36.0)
MCV: 92.1 fL (ref 80.0–100.0)
Platelets: 204 10*3/uL (ref 150–400)
RBC: 4.93 MIL/uL (ref 3.87–5.11)
RDW: 13.9 % (ref 11.5–15.5)
WBC: 7.9 10*3/uL (ref 4.0–10.5)
nRBC: 0 % (ref 0.0–0.2)

## 2022-05-28 MED ORDER — CARBAMIDE PEROXIDE 6.5 % OT SOLN: 5.0000 [drp] | Freq: Two times a day (BID) | OTIC | 0 refills | Status: AC

## 2022-05-28 MED ORDER — SODIUM CHLORIDE 0.9 % IV BOLUS
250.0000 mL | Freq: Once | INTRAVENOUS | Status: AC
Start: 2022-05-28 — End: 2022-05-28
  Administered 2022-05-28: 250 mL via INTRAVENOUS

## 2022-05-28 NOTE — Discharge Instructions (Addendum)
Your blood work today looks great. You are very dehydrated but we gave you fluids through the IV to help you and now you feel better!  This is great news and very reassuring.  Continue to drink water and Gatorade to stay well-hydrated. Continue to eat a well-balanced diet. I would like for you to follow-up with your cardiologist and your primary care provider to discuss prevention of dehydration in the future since you are only able to drink but so much because of your heart and kidney problems. Continue taking all of your other medications as prescribed. You may continue taking Tylenol 1000 mg every 6 hours as needed for pain. Apply heat to your back 20 minutes on 20 minutes off to relax muscles to your back that are tense because of the fall.  You may use debrox ear drops over the counter as needed to the left ear  to get some wax out.   Return to urgent care as needed. Go to the nearest emergency department if you start having sudden onset or new or worsening symptoms.

## 2022-05-28 NOTE — ED Triage Notes (Signed)
Pt states lost her balance Saturday morning 6am walking back from the bathroom and fell forward landing on carpet. Denies hitting her head or LOC. States "feels like I'm in a bubble" since the fall. C/o aching all over. States took tylenol 2 days ago.
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
## 2022-05-31 ENCOUNTER — Other Ambulatory Visit: Payer: Self-pay | Admitting: Family Medicine

## 2022-05-31 DIAGNOSIS — R928 Other abnormal and inconclusive findings on diagnostic imaging of breast: Secondary | ICD-10-CM

## 2022-06-01 ENCOUNTER — Ambulatory Visit (INDEPENDENT_AMBULATORY_CARE_PROVIDER_SITE_OTHER): Payer: 59 | Admitting: Family Medicine

## 2022-06-01 ENCOUNTER — Encounter: Payer: Self-pay | Admitting: Family Medicine

## 2022-06-01 VITALS — BP 120/70 | HR 80 | Resp 16 | Ht 63.0 in | Wt 205.0 lb

## 2022-06-01 DIAGNOSIS — R42 Dizziness and giddiness: Secondary | ICD-10-CM | POA: Diagnosis not present

## 2022-06-01 DIAGNOSIS — I10 Essential (primary) hypertension: Secondary | ICD-10-CM

## 2022-06-01 DIAGNOSIS — Y92009 Unspecified place in unspecified non-institutional (private) residence as the place of occurrence of the external cause: Secondary | ICD-10-CM

## 2022-06-01 DIAGNOSIS — W19XXXD Unspecified fall, subsequent encounter: Secondary | ICD-10-CM

## 2022-06-01 NOTE — Patient Instructions (Addendum)
A few things to remember from today's visit:  Dizziness  Essential hypertension Today we decreased carvedilol from 25 mg 2 times daily to 12.5 mg 2 times daily (1/2 tab). No changes in the rest of your meds. Mention dizziness to your cardiologist and neurologist. Fall precautions.  If you need refills for medications you take chronically, please call your pharmacy. Do not use My Chart to request refills or for acute issues that need immediate attention. If you send a my chart message, it may take a few days to be addressed, specially if I am not in the office.  Please be sure medication list is accurate. If a new problem present, please set up appointment sooner than planned today.

## 2022-06-02 NOTE — Assessment & Plan Note (Addendum)
She has had episodes of dizziness in the past. We discussed possible causes. This time her BP has been low, so antihypertensive treatment adjusted.  Hx and examination today do not suggest a serious process. Some of her comorbilities and medications can be contributing factors. I do not think further work-up is needed at this time. Encourage adequate hydration and fall prevention discussed. Recommend following with neuro and cardiologist sooner than planned if problem gets worse.

## 2022-06-02 NOTE — Assessment & Plan Note (Signed)
BP today is adequate but having episode of hypotension at home and BP was also low during recent ED visit. Recommend decreasing Carvedilol from 25 mg bit to 12.5 mg bid. No changes in Avapro dose. Continue low salt diet and monitoring BP at home. Instructed about warning signs. F/U in 2 months, before if needed.

## 2022-06-05 DIAGNOSIS — E1122 Type 2 diabetes mellitus with diabetic chronic kidney disease: Secondary | ICD-10-CM | POA: Diagnosis not present

## 2022-06-05 DIAGNOSIS — R42 Dizziness and giddiness: Secondary | ICD-10-CM | POA: Diagnosis not present

## 2022-06-05 DIAGNOSIS — I129 Hypertensive chronic kidney disease with stage 1 through stage 4 chronic kidney disease, or unspecified chronic kidney disease: Secondary | ICD-10-CM | POA: Diagnosis not present

## 2022-06-05 DIAGNOSIS — R609 Edema, unspecified: Secondary | ICD-10-CM | POA: Diagnosis not present

## 2022-06-05 DIAGNOSIS — I5030 Unspecified diastolic (congestive) heart failure: Secondary | ICD-10-CM | POA: Diagnosis not present

## 2022-06-05 DIAGNOSIS — N183 Chronic kidney disease, stage 3 unspecified: Secondary | ICD-10-CM | POA: Diagnosis not present

## 2022-06-06 LAB — LAB REPORT - SCANNED
Albumin, Urine POC: 22.2
Creatinine, POC: 95.5 mg/dL
Microalb Creat Ratio: 23

## 2022-06-16 ENCOUNTER — Other Ambulatory Visit: Payer: Self-pay | Admitting: Family Medicine

## 2022-06-18 ENCOUNTER — Telehealth: Payer: Self-pay | Admitting: Family Medicine

## 2022-06-18 NOTE — Telephone Encounter (Signed)
Called patient to schedule Medicare Annual Wellness Visit (AWV). Left message for patient to call back and schedule Medicare Annual Wellness Visit (AWV).  Last date of AWV: 08/18/20  Please schedule an appointment at any time with [facility] or Colin Benton.  If any questions, please contact me at [phone].  Thank you ,  Barkley Boards AWV direct phone # [phone]

## 2022-07-04 ENCOUNTER — Ambulatory Visit
Admission: RE | Admit: 2022-07-04 | Discharge: 2022-07-04 | Disposition: A | Discharge status: Home / Self Care | Payer: 59 | Source: Ambulatory Visit | Attending: Family Medicine | Admitting: Family Medicine

## 2022-07-04 ENCOUNTER — Other Ambulatory Visit: Payer: 59

## 2022-07-04 DIAGNOSIS — R928 Other abnormal and inconclusive findings on diagnostic imaging of breast: Secondary | ICD-10-CM

## 2022-07-04 DIAGNOSIS — N6002 Solitary cyst of left breast: Secondary | ICD-10-CM | POA: Diagnosis not present

## 2022-07-05 ENCOUNTER — Other Ambulatory Visit: Payer: Self-pay | Admitting: Neurology

## 2022-07-05 ENCOUNTER — Other Ambulatory Visit: Payer: Self-pay | Admitting: Family Medicine

## 2022-07-05 DIAGNOSIS — M48062 Spinal stenosis, lumbar region with neurogenic claudication: Secondary | ICD-10-CM

## 2022-07-05 DIAGNOSIS — M797 Fibromyalgia: Secondary | ICD-10-CM

## 2022-07-09 ENCOUNTER — Telehealth: Payer: Self-pay

## 2022-07-09 NOTE — Progress Notes (Signed)
Care Management & Coordination Services Pharmacy Team  Reason for Encounter: Appointment Reminder  Contacted patient to confirm telephone appointment with Delano Metz, PharmD on 07/10/2022 at 11:15. Unsuccessful outreach. Left voicemail for patient to return call.  Do you have any problems getting your medications?  If yes what types of problems are you experiencing?   What is your top health concern you would like to discuss at your upcoming visit?   Have you seen any other providers since your last visit with PCP?   Care Gaps: AWV - completed 08/18/2020,  Last BP - 120/70 on 06/01/2022 Last A1C - 5.5 on 01/22/2022 Last eye exam - 01/27/2020 Last foot exam - 04/20/2022 Shingrix - overdue Dexa - overdue Covid - overdue Colonoscopy - postponed  Star Rating Drugs:  Atorvastatin 40 mg - last filled 06/29/2022 90 DS at [facility] Irbesartan 75 mg - last filled 04/11/2022 90 DS at [facility]  Clinical Pharmacist Assistant [phone]

## 2022-07-10 DIAGNOSIS — E119 Type 2 diabetes mellitus without complications: Secondary | ICD-10-CM | POA: Diagnosis not present

## 2022-07-10 NOTE — Progress Notes (Unsigned)
Care Management & Coordination Services Pharmacy Note  07/10/2022 Name:  [name] MRN:  [id number] DOB:  26-Feb-1941  Summary: ***  Recommendations/Changes made from today's visit: ***  Follow up plan: ***   Subjective: [name] is an 82 y.o. year old female who is a primary patient of [place], Timoteo Expose, MD.  The care coordination team was consulted for assistance with disease management and care coordination needs.    {CCMTELEPHONEFACETOFACE:21091510} for follow up visit.  Recent office visits: 06/01/22 [name], MD - For dizziness/fall - no med changes  05/21/22 [name], MD - For left hip pan. STOP Benzonatate and Promethazine-DM  Syrup  02/20/2022 [name] MD - Patient was seen for Dizziness and an additional concern. Discontinued Meclizine.   Recent consult visits: 04/20/22 Hillard Danker, MD - For leg pain with sciatic, start Prednisone 40mg  daily  Hospital visits: 05/28/22 - [facility] - For fall, dehydration and dizziness. START Debrox ear drops BID   Objective:  Lab Results  Component Value Date   CREATININE 1.60 (H) 05/28/2022   BUN 23 05/28/2022   GFR 37.74 (L) 02/06/2021   GFRNONAA 32 (L) 05/28/2022   GFRAA 37 (L) 11/18/2019   NA 139 05/28/2022   K 4.5 05/28/2022   CALCIUM 9.6 05/28/2022   CO2 25 05/28/2022   GLUCOSE 109 (H) 05/28/2022    Lab Results  Component Value Date/Time   HGBA1C 5.5 01/22/2022 07:18 AM   HGBA1C 6.0 08/16/2021 09:05 AM   HGBA1C 5.9 02/06/2021 09:58 AM   FRUCTOSAMINE 226 02/06/2021 09:58 AM   FRUCTOSAMINE 225 05/18/2020 02:52 PM   GFR 37.74 (L) 02/06/2021 09:58 AM   GFR 27.26 (L) 11/02/2020 02:59 PM   MICROALBUR <0.2 11/18/2019 10:08 AM    Last diabetic Eye exam:  Lab Results  Component Value Date/Time   HMDIABEYEEXA No Retinopathy 01/27/2020 07:03 AM    Last diabetic Foot exam: No results found for: "HMDIABFOOTEX"   Lab Results  Component Value Date   CHOL  136 08/16/2021   HDL 49.30 08/16/2021   LDLCALC 70 08/16/2021   LDLDIRECT 172.2 08/07/2010   TRIG 83.0 08/16/2021   CHOLHDL 3 08/16/2021       Latest Ref Rng & Units 05/28/2022   12:57 PM 02/13/2022    3:21 PM 08/30/2021    3:39 PM  Hepatic Function  Total Protein 6.5 - 8.1 g/dL 7.3  7.5  6.9   Albumin 3.5 - 5.0 g/dL 3.4  3.4  3.3   AST 15 - 41 U/L 23  18  17    ALT 0 - 44 U/L 23  17  16    Alk Phosphatase 38 - 126 U/L 100  96  86   Total Bilirubin 0.3 - 1.2 mg/dL 0.5  0.5  0.4     Lab Results  Component Value Date/Time   TSH 2.374 08/30/2021 03:39 PM   TSH 2.30 09/15/2019 10:44 AM   TSH 2.76 07/31/2016 03:40 PM   FREET4 0.91 01/24/2016 04:26 PM   FREET4 0.99 08/21/2014 11:48 AM       Latest Ref Rng & Units 05/28/2022   12:57 PM 02/13/2022    3:21 PM 09/26/2021    1:07 PM  CBC  WBC 4.0 - 10.5 K/uL 7.9  6.5  5.6   Hemoglobin 12.0 - 15.0 g/dL 40.9  81.1  91.4   Hematocrit 36.0 - 46.0 % 45.4  44.0  41.8   Platelets 150 - 400 K/uL 204  186  199.0     Lab Results  Component Value Date/Time   VD25OH 42.2 04/10/2021 12:00 AM   VITAMINB12 1,012 (H) 09/15/2019 10:44 AM   VITAMINB12 1,285 (H) 10/19/2014 10:50 AM    Clinical ASCVD: {YES/NO:21197} The ASCVD Risk score (Arnett DK, et al., 2019) failed to calculate for the following reasons:   The 2019 ASCVD risk score is only valid for ages 41 to 59   The patient has a prior MI or stroke diagnosis    ***Other: (CHADS2VASc if Afib, MMRC or CAT for COPD, ACT, DEXA)     05/21/2022   11:00 AM 04/20/2022   11:10 AM 02/20/2022    7:37 AM  Depression screen PHQ 2/9  Decreased Interest 0 0 2  Down, Depressed, Hopeless 0 0 2  PHQ - 2 Score 0 0 4  Altered sleeping  0 2  Tired, decreased energy  0 2  Change in appetite  0 2  Feeling bad or failure about yourself   0 0  Trouble concentrating  0 1  Moving slowly or fidgety/restless  0 2  Suicidal thoughts  0 0  PHQ-9 Score  0 13  Difficult doing work/chores  Not difficult at  all Not difficult at all     Social History   Tobacco Use  Smoking Status Never   Passive exposure: Yes  Smokeless Tobacco Never  Tobacco Comments   From Father.   BP Readings from Last 3 Encounters:  06/01/22 120/70  05/28/22 115/75  05/21/22 124/80   Pulse Readings from Last 3 Encounters:  06/01/22 80  05/28/22 74  05/21/22 94   Wt Readings from Last 3 Encounters:  06/01/22 205 lb (93 kg)  05/21/22 200 lb (90.7 kg)  04/20/22 200 lb (90.7 kg)   BMI Readings from Last 3 Encounters:  06/01/22 36.31 kg/m  05/21/22 35.43 kg/m  04/20/22 35.43 kg/m    Allergies  Allergen Reactions   Bee Pollen Other (See Comments)    Seasonal allergies   Pollen Extract Other (See Comments)    Seasonal allergies    Medications Reviewed Today     Reviewed by [place], Betty G, MD (Physician) on 06/02/22 at 1808  Med List Status: <None>   Medication Order Taking? Sig Documenting Provider Last Dose Status Informant  albuterol (PROAIR HFA) 108 (90 Base) MCG/ACT inhaler [id number] Yes INHALE 2 PUFFS BY MOUTH EVERY 6 HOURS AS NEEDED FOR WHEEZING [place], Betty G, MD Taking Active   atorvastatin (LIPITOR) 40 MG tablet [id number] Yes TAKE 1 TABLET(40 MG) BY MOUTH DAILY AT 6 PM [place], Betty G, MD Taking Active   carbamide peroxide (DEBROX) 6.5 % OTIC solution [id number] Yes Place 5 drops into the left ear 2 (two) times daily. Carlisle Beers, FNP Taking Active   carvedilol (COREG) 25 MG tablet [id number] Yes Take 12.5 mg by mouth 2 (two) times daily. [provider] Taking Active Self  DULoxetine (CYMBALTA) 60 MG capsule [id number] Yes TAKE 1 CAPSULE(60 MG) BY MOUTH DAILY [place], Betty G, MD Taking Active   ELIQUIS 5 MG TABS tablet [id number] Yes TAKE 1 TABLET(5 MG) BY MOUTH TWICE DAILY [place], Betty G, MD Taking Active   famotidine (PEPCID) 20 MG tablet [id number] Yes TAKE 1 TABLET(20 MG) BY MOUTH AT BEDTIME [place], Betty G, MD Taking Active   furosemide (LASIX) 40 MG tablet [id number]  Yes TAKE 1 TABLET(40 MG) BY MOUTH DAILY  Patient taking differently: Take 40 mg by mouth as needed for fluid.   [place], Betty G,  MD Taking Active Self  irbesartan (AVAPRO) 75 MG tablet [id number] Yes Take 1 tablet (75 mg total) by mouth daily. [place], Betty G, MD Taking Active   LamoTRIgine 200 MG TB24 24 hour tablet [id number] Yes Take 1 tablet (200 mg total) by mouth at bedtime. Glean SalvoSlack, Sarah J, NP Taking Active   memantine [facility](NAMENDA) 10 MG tablet [id number] Yes Take 1 tablet (10 mg total) by mouth 2 (two) times daily. Glean SalvoSlack, Sarah J, NP Taking Active   montelukast (SINGULAIR) 10 MG tablet [id number] Yes TAKE 1 TABLET(10 MG) BY MOUTH EVERY DAY IN THE Jobe Gibbon[place]ENING [place], Betty G, MD Taking Active   nitroGLYCERIN (NITROSTAT) 0.4 MG SL tablet [id number] Yes PLACE 1 TABLET UNDER TONGUE EVERY 5 MINUTES AS NEEDED FOR CHEST PAIN [place], Betty G, MD Taking Active   pantoprazole (PROTONIX) 40 MG tablet [id number] Yes TAKE 1 TABLET(40 MG) BY MOUTH DAILY [place], Betty G, MD Taking Active             SDOH:  (Social Determinants of Health) assessments and interventions performed: Yes SDOH Interventions    Flowsheet Row [facility] [place] [facility] Clinical Support [facility] [facility]  SDOH Interventions       Food Insecurity Interventions -- -- Intervention Not Indicated -- --  Housing Interventions -- -- Intervention Not Indicated -- --  Transportation Interventions Intervention Not Indicated -- Intervention Not Indicated -- --  Depression Interventions/Treatment  -- Currently on Treatment -- --  [resources for grief counseling provided] Patient refuses Treatment  [patient states she has contact  for counseling if needed]  Financial Strain Interventions Intervention Not Indicated -- Intervention Not Indicated -- --  Physical Activity Interventions -- -- Intervention Not Indicated -- --  Stress Interventions -- -- Intervention Not Indicated -- --  Social Connections Interventions -- -- Intervention Not Indicated -- --       Medication Assistance: {MEDASSISTANCEINFO:25044}  Medication Access: Within the past 30 days, how often has patient missed a dose of medication? *** Is a pillbox or other method used to improve adherence? {YES/NO:21197} Factors that may affect medication adherence? {CHL DESC; BARRIERS:21522} Are meds synced by current pharmacy? {YES/NO:21197} Are meds delivered by current pharmacy? {YES/NO:21197} Does patient experience delays in picking up medications due to transportation concerns? {YES/NO:21197}  [facility] Services Reviewed: Is patient disadvantaged to use [facility] Pharm[facility]?: Yes  Current Rx insurance plan: [facility] Name and location of Current pharmacy:  [facility] [phone]#[id number] [place] - [address] [place] [address] [place] [id number] Phone: [phone] Fax: [phone]  [facility] services reviewed with patient today?: No  Patient requests to transfer care to [facility] Pharmacy?: No  Reason patient declined to change pharmacies: Disadvantaged due to insurance/mail order  Compliance/Adherence/Medication fill history: Care Gaps: AWV - completed 08/18/2020,  Last BP - 120/70 on 06/01/2022 Last A1C - 5.5 on 01/22/2022 Last eye exam - 01/27/2020 Last foot exam - 04/20/2022 Shingrix - overdue Dexa - overdue Covid - overdue Colonoscopy - postponed  [facility]: Atorvastatin 40 mg - [facility] Irbesartan 75 mg - last filled 04/11/2022 [facility]   Assessment/Plan   Hypertension (BP goal <140/90) -Controlled -Current  treatment: Carvedilol 25mg  1/2 tab BID Lasix 40mg  1 qd Irbesartan 75mg  1 qd -Medications previously tried: Losartan/HCTZ, Propranolol, Spironolactone -Current home readings: *** -Current dietary habits: *** -Current exercise habits: *** -{ACTIONS;DENIES/REPORTS:21021675::"Denies"} hypotensive/hypertensive symptoms -Educated on {CCM BP Counseling:25124} -Counseled to monitor BP at home ***, document, and provide log at future appointments -{CCMPHARMDINTERVENTION:25122}  Hyperlipidemia: (LDL goal < 70) -Controlled -Current treatment: Atorvastatin 40mg  qd -Medications previously tried: Simvastatin  -Current dietary patterns: *** -Current exercise habits: *** -Educated on {CCM HLD Counseling:25126} -{CCMPHARMDINTERVENTION:25122}  Sherrill Raring Clinical Pharmacist [phone]
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
## 2022-07-27 ENCOUNTER — Ambulatory Visit (INDEPENDENT_AMBULATORY_CARE_PROVIDER_SITE_OTHER): Payer: 59 | Admitting: Family Medicine

## 2022-07-27 ENCOUNTER — Encounter: Payer: Self-pay | Admitting: Family Medicine

## 2022-07-27 VITALS — BP 120/70 | HR 78 | Temp 98.4°F | Resp 16 | Ht 63.0 in

## 2022-07-27 DIAGNOSIS — M797 Fibromyalgia: Secondary | ICD-10-CM | POA: Diagnosis not present

## 2022-07-27 DIAGNOSIS — M25511 Pain in right shoulder: Secondary | ICD-10-CM

## 2022-07-27 DIAGNOSIS — G8929 Other chronic pain: Secondary | ICD-10-CM

## 2022-07-27 DIAGNOSIS — K5900 Constipation, unspecified: Secondary | ICD-10-CM | POA: Diagnosis not present

## 2022-07-27 DIAGNOSIS — M654 Radial styloid tenosynovitis [de Quervain]: Secondary | ICD-10-CM | POA: Diagnosis not present

## 2022-07-27 MED ORDER — LINACLOTIDE 72 MCG PO CAPS: 72.0000 ug | ORAL_CAPSULE | Freq: Every day | ORAL | 1 refills | Status: DC

## 2022-07-27 NOTE — Patient Instructions (Addendum)
A few things to remember from today's visit:  De Quervain's tenosynovitis, left - Plan: Ambulatory referral to Sports Medicine  Chronic right shoulder pain - Plan: Ambulatory referral to Sports Medicine  Constipation, unspecified constipation type - Plan: linaclotide (LINZESS) 72 MCG capsule Tylenol 500 mg 3-4 times per day as needed. Left wrist splint with thumb support may help. Linzess for constipation, do not leave the house when you try.  If you need refills for medications you take chronically, please call your pharmacy. Do not use My Chart to request refills or for acute issues that need immediate attention. If you send a my chart message, it may take a few days to be addressed, specially if I am not in the office.  Please be sure medication list is accurate. If a new problem present, please set up appointment sooner than planned today.
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
## 2022-07-29 ENCOUNTER — Encounter: Payer: Self-pay | Admitting: Family Medicine

## 2022-07-29 NOTE — Assessment & Plan Note (Signed)
Problem is not well controlled and most likely contributing to LLQ pain. OTC medications have not help. Continue adequate fluid and fiber intake. She agrees with trying Linzess 72 mcg daily as needed. Side effects discussed. Colonoscopy done on 11/29/15.

## 2022-07-29 NOTE — Assessment & Plan Note (Signed)
We discussed diagnosis and prognosis. Some of her symptoms can be caused by this problem. She does not feel like Duloxetine is helping but for now she decides to continue it. If next visit she still feels like it is not helping, we can start weaning medication off. Low impact exercise and a good sleep hygiene.

## 2022-08-03 ENCOUNTER — Other Ambulatory Visit: Payer: Self-pay

## 2022-08-03 ENCOUNTER — Encounter (HOSPITAL_COMMUNITY): Payer: Self-pay | Admitting: Emergency Medicine

## 2022-08-03 ENCOUNTER — Ambulatory Visit (HOSPITAL_COMMUNITY)
Admission: EM | Admit: 2022-08-03 | Discharge: 2022-08-03 | Disposition: A | Discharge status: Home / Self Care | Payer: 59 | Attending: Physician Assistant | Admitting: Physician Assistant

## 2022-08-03 DIAGNOSIS — M654 Radial styloid tenosynovitis [de Quervain]: Secondary | ICD-10-CM | POA: Diagnosis not present
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
## 2022-08-03 NOTE — Discharge Instructions (Addendum)
Apply ice to wrist 15 minutes 4 times per day Take ibuprofen as needed Wear splint as discussed

## 2022-08-03 NOTE — ED Triage Notes (Signed)
Patient presents to [facility] for evaluation of left hand pain, chronic in nature, and was sent to an orthopedist for follow up but could not find the building and got frustrated and decided to come here.

## 2022-08-08 ENCOUNTER — Encounter: Payer: Self-pay | Admitting: Family Medicine

## 2022-08-08 DIAGNOSIS — I10 Essential (primary) hypertension: Secondary | ICD-10-CM | POA: Diagnosis not present

## 2022-08-08 DIAGNOSIS — I2510 Atherosclerotic heart disease of native coronary artery without angina pectoris: Secondary | ICD-10-CM | POA: Diagnosis not present

## 2022-08-08 DIAGNOSIS — E782 Mixed hyperlipidemia: Secondary | ICD-10-CM | POA: Diagnosis not present

## 2022-08-08 DIAGNOSIS — N182 Chronic kidney disease, stage 2 (mild): Secondary | ICD-10-CM | POA: Diagnosis not present

## 2022-08-09 ENCOUNTER — Emergency Department (HOSPITAL_COMMUNITY)
Admission: EM | Admit: 2022-08-09 | Discharge: 2022-08-09 | Disposition: A | Discharge status: Home / Self Care | Payer: 59 | Attending: Emergency Medicine | Admitting: Emergency Medicine

## 2022-08-09 ENCOUNTER — Emergency Department (HOSPITAL_COMMUNITY): Payer: 59

## 2022-08-09 DIAGNOSIS — Z8673 Personal history of transient ischemic attack (TIA), and cerebral infarction without residual deficits: Secondary | ICD-10-CM | POA: Diagnosis not present

## 2022-08-09 DIAGNOSIS — R109 Unspecified abdominal pain: Secondary | ICD-10-CM | POA: Diagnosis not present

## 2022-08-09 DIAGNOSIS — E119 Type 2 diabetes mellitus without complications: Secondary | ICD-10-CM | POA: Diagnosis not present

## 2022-08-09 DIAGNOSIS — I10 Essential (primary) hypertension: Secondary | ICD-10-CM | POA: Diagnosis not present

## 2022-08-09 DIAGNOSIS — J45909 Unspecified asthma, uncomplicated: Secondary | ICD-10-CM | POA: Insufficient documentation

## 2022-08-09 DIAGNOSIS — R1031 Right lower quadrant pain: Secondary | ICD-10-CM | POA: Diagnosis not present

## 2022-08-09 LAB — CBC
HCT: 41.2 % (ref 36.0–46.0)
Hemoglobin: 13.1 g/dL (ref 12.0–15.0)
MCH: 28.9 pg (ref 26.0–34.0)
MCHC: 31.8 g/dL (ref 30.0–36.0)
MCV: 90.7 fL (ref 80.0–100.0)
Platelets: 174 10*3/uL (ref 150–400)
RBC: 4.54 MIL/uL (ref 3.87–5.11)
RDW: 13.5 % (ref 11.5–15.5)
WBC: 5 10*3/uL (ref 4.0–10.5)
nRBC: 0 % (ref 0.0–0.2)

## 2022-08-09 LAB — COMPREHENSIVE METABOLIC PANEL
ALT: 15 U/L (ref 0–44)
AST: 17 U/L (ref 15–41)
Albumin: 2.9 g/dL — ABNORMAL LOW (ref 3.5–5.0)
Alkaline Phosphatase: 97 U/L (ref 38–126)
Anion gap: 6 (ref 5–15)
BUN: 21 mg/dL (ref 8–23)
CO2: 24 mmol/L (ref 22–32)
Calcium: 8.7 mg/dL — ABNORMAL LOW (ref 8.9–10.3)
Chloride: 106 mmol/L (ref 98–111)
Creatinine, Ser: 1.54 mg/dL — ABNORMAL HIGH (ref 0.44–1.00)
GFR, Estimated: 34 mL/min — ABNORMAL LOW (ref >60)
Glucose, Bld: 103 mg/dL — ABNORMAL HIGH (ref 70–99)
Potassium: 4.4 mmol/L (ref 3.5–5.1)
Sodium: 136 mmol/L (ref 135–145)
Total Bilirubin: 0.7 mg/dL (ref 0.3–1.2)
Total Protein: 6.5 g/dL (ref 6.5–8.1)

## 2022-08-09 LAB — URINALYSIS, ROUTINE W REFLEX MICROSCOPIC
Bilirubin Urine: NEGATIVE
Glucose, UA: NEGATIVE mg/dL
Hgb urine dipstick: NEGATIVE
Ketones, ur: NEGATIVE mg/dL
Leukocytes,Ua: NEGATIVE
Nitrite: NEGATIVE
Protein, ur: NEGATIVE mg/dL
Specific Gravity, Urine: 1.017 (ref 1.005–1.030)
pH: 5 (ref 5.0–8.0)

## 2022-08-09 MED ORDER — DICLOFENAC SODIUM 1 % EX GEL: 2.0000 g | Freq: Four times a day (QID) | CUTANEOUS | 0 refills | Status: AC

## 2022-08-09 MED ORDER — ACETAMINOPHEN 500 MG PO TABS
1000.0000 mg | ORAL_TABLET | Freq: Once | ORAL | Status: AC
  Administered 2022-08-09: 1000 mg via ORAL
  Filled 2022-08-09: qty 2

## 2022-08-09 MED ORDER — SENNOSIDES-DOCUSATE SODIUM 8.6-50 MG PO TABS: 1.0000 | ORAL_TABLET | Freq: Every evening | ORAL | 0 refills | Status: AC | PRN

## 2022-08-09 NOTE — Discharge Instructions (Signed)

## 2022-08-09 NOTE — ED Notes (Signed)
Patient returned to room from CT. 

## 2022-08-09 NOTE — ED Notes (Signed)
Patient transported to CT 

## 2022-08-09 NOTE — ED Triage Notes (Signed)
Pt c/o 2-3 days of R sided abdominal pain. Denies N/V/D, urinary problems. Pt states that pain intensifies with movement.

## 2022-08-09 NOTE — ED Provider Triage Note (Signed)
Emergency Medicine Provider Triage Evaluation Note  [name] , a 82 y.o. female  was evaluated in triage.  Pt complains of R side pain for several days. Pain worse with rotational movements and walking. No injury. No urinary sx. Last BM 2 days ago which is normal for her.   Review of Systems  Positive: Back pain, right flank pain Negative: Abd pain, N/V/D, urinary sx, weakness, numbness  Physical Exam  BP 120/69   Pulse 68   Temp 98.6 F (37 C)   Resp 16   SpO2 98%  Gen:   Awake, no distress   Resp:  Normal effort  MSK:   Moves extremities without difficulty  Other:    Medical Decision Making  Medically screening exam initiated at 2:57 PM.  Appropriate orders placed.  Margart Sickles was informed that the remainder of the evaluation will be completed by another provider, this initial triage assessment does not replace that evaluation, and the importance of remaining in the ED until their evaluation is complete.  Workup initiated   Anakaren Campion T, PA-C 08/09/22 1457
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
## 2022-08-14 ENCOUNTER — Telehealth: Payer: Self-pay | Admitting: Family Medicine

## 2022-08-14 NOTE — Telephone Encounter (Signed)
Called patient to schedule Medicare Annual Wellness Visit (AWV). No voicemail available to leave a message.  Last date of AWV: 08/18/20  Please schedule an appointment at any time with [facility] or [employer].  If any questions, please contact me at [phone].  Thank you ,  Rudell Cobb AWV direct phone # [phone]

## 2022-08-20 ENCOUNTER — Telehealth: Payer: Self-pay

## 2022-08-20 NOTE — Telephone Encounter (Signed)
Transition Care Management Unsuccessful Follow-up Telephone Call  Date of discharge and from where:  08/09/2022 [facility]  Attempts:  2nd Attempt  Reason for unsuccessful TCM follow-up call:  No answer/busy  Jaimee Corum [facility]  [facility] Population Health Community Resource Care Guide   [email or web address]  ?? [phone]   Website: [email or web address]  [email or web address]

## 2022-08-20 NOTE — Telephone Encounter (Signed)
Transition Care Management Unsuccessful Follow-up Telephone Call  Date of discharge and from where:  08/09/2022 [facility]  Attempts:  1st Attempt  Reason for unsuccessful TCM follow-up call:  Voice mail full  Cyntia Staley [facility]  [facility] Population Health Community Resource Care Guide   [email or web address]  ?? [phone]   Website: [email or web address]  [email or web address]

## 2022-08-22 ENCOUNTER — Other Ambulatory Visit: Payer: Self-pay

## 2022-08-22 ENCOUNTER — Other Ambulatory Visit: Payer: Self-pay | Admitting: Family Medicine

## 2022-08-22 ENCOUNTER — Ambulatory Visit (INDEPENDENT_AMBULATORY_CARE_PROVIDER_SITE_OTHER): Payer: 59 | Admitting: Family Medicine

## 2022-08-22 ENCOUNTER — Ambulatory Visit (INDEPENDENT_AMBULATORY_CARE_PROVIDER_SITE_OTHER): Payer: 59

## 2022-08-22 VITALS — BP 128/76 | HR 80 | Ht 63.0 in | Wt 196.0 lb

## 2022-08-22 DIAGNOSIS — M25532 Pain in left wrist: Secondary | ICD-10-CM

## 2022-08-22 DIAGNOSIS — M79642 Pain in left hand: Secondary | ICD-10-CM | POA: Diagnosis not present

## 2022-08-22 LAB — URIC ACID: Uric Acid, Serum: 5.4 mg/dL (ref 2.4–7.0)

## 2022-08-22 NOTE — Progress Notes (Unsigned)
Rubin Payor, PhD, LAT, ATC acting as a scribe for Clementeen Graham, MD.  [name] is a 82 y.o. female who presents to [facility] today for L hand pain. She was seen at [facility] on 08/03/22 for L wrist pain thought to be due to De Quervain's tenosynovitis.   Today, pt c/o L hand pain x 2-3 wks. Pt locates pain to across the carpal of the L and all over her forearm. She notes weakness in her L hand. She is R-hand dominate.  She notes pain is dominant at the dorsal aspect of her wrist.  She denies much pain at the radial aspect of her wrist.  Grip strength: decreased Paresthesia: no Radiates: yes Aggravates: pronation/supination, washing dishes, ringing out a map, Treatments tried: wrist brace, diclofenac gel  Pertinent review of systems: No fevers or chills.  Relevant historical information: Coronary artery disease.  Diabetes.   Exam:  BP 128/76   Pulse 80   Ht 5\' 3"  (1.6 m)   Wt 196 lb (88.9 kg)   SpO2 95%   BMI 34.72 kg/m  General: Well Developed, well nourished, and in no acute distress.   MSK: Left wrist mild swelling. Tender palpation dorsal wrist.  Not particularly tender radial wrist. Decreased wrist motion.  Intact strength. Pulses cap refill and sensation are intact distally.    Lab and Radiology Results  Procedure: Real-time Ultrasound Guided Injection of left dorsal wrist joint (radiocarpal joint dorsally) Device: Philips Affiniti 50G Images permanently stored and available for review in PACS Ultrasound evaluation prior to injection reveals mild wrist effusion.  Tenosynovitis present at the first and third dorsal wrist compartments. Verbal informed consent obtained.  Discussed risks and benefits of procedure. Warned about infection, bleeding, hyperglycemia damage to structures among others. Patient expresses understanding and agreement Time-out conducted.   Noted no overlying erythema, induration, or  other signs of local infection.   Skin prepped in a sterile fashion.   Local anesthesia: Topical Ethyl chloride.   With sterile technique and under real time ultrasound guidance: 40 mg of Kenalog and 1 mL of lidocaine injected into dorsal wrist radiocarpal joint. Fluid seen entering the wrist joint.   Completed without difficulty   Pain immediately resolved suggesting accurate placement of the medication.   Advised to call if fevers/chills, erythema, induration, drainage, or persistent bleeding.   Images permanently stored and available for review in the ultrasound unit.  Impression: Technically successful ultrasound guided injection.    X-ray images left hand obtained today personally and independently interpreted Diffuse degenerative changes across wrist.  No acute fractures are visible. Await formal radiology review    Assessment and Plan: 82 y.o. female with left wrist and hand pain.  Pain occurred spontaneously without injury.  Etiology is somewhat unclear.  Could be exacerbation of DJD versus gout flare versus tenosynovitis.  She may have overlapping problems.  I think she has a component of wrist pain and effusion and likely some tenosynovitis.  Wrist injection today should be helpful.  Will also check for gout with uric acid.  If not improving consider occupational therapy referral.  Recheck in 1 month.   PDMP not reviewed this encounter. Orders Placed This Encounter  Procedures   [place] LIMITED JOINT SPACE STRUCTURES UP LEFT(NO LINKED CHARGES)    Order Specific Question:   Reason for Exam (SYMPTOM  OR DIAGNOSIS REQUIRED)    Answer:   left wrist pain    Order Specific Question:  Preferred imaging location?    Answer:   [facility]   Uric acid    Standing Status:   Future    Number of Occurrences:   1    Standing Expiration Date:   08/22/2023   No orders of the defined types were placed in this encounter.    Discussed warning signs or symptoms.  Please see discharge instructions. Patient expresses understanding.   The above documentation has been reviewed and is accurate and complete Clementeen Graham, M.D.

## 2022-08-22 NOTE — Patient Instructions (Addendum)
Thank you for coming in today.   Please get an Xray today before you leave   You received an injection today. Seek immediate medical attention if the joint becomes red, extremely painful, or is oozing fluid.   Please get labs today before you leave   Check back in 1 month

## 2022-08-23 NOTE — Progress Notes (Signed)
Uric acid is normal.  I do not think you have gout.

## 2022-08-28 NOTE — Progress Notes (Signed)
Left hand x-ray shows arthritis especially at the base of the thumb and in the middle knuckles of the hand.

## 2022-09-10 ENCOUNTER — Ambulatory Visit (INDEPENDENT_AMBULATORY_CARE_PROVIDER_SITE_OTHER): Payer: 59 | Admitting: Family Medicine

## 2022-09-10 ENCOUNTER — Encounter: Payer: Self-pay | Admitting: Family Medicine

## 2022-09-10 VITALS — BP 98/60 | HR 78 | Ht 63.0 in | Wt 190.0 lb

## 2022-09-10 DIAGNOSIS — M25532 Pain in left wrist: Secondary | ICD-10-CM | POA: Diagnosis not present

## 2022-09-10 NOTE — Progress Notes (Signed)
   I, Stevenson Clinch, CMA acting as a scribe for Cassidy Graham, MD.  [name] is a 82 y.o. female who presents to [facility] at [facility] today for f/u L wrist pain. Pt was last seen by Dr. Denyse [name] on 08/22/22 and was given a L dorsal radiocarpal joint steroid injection and her uric acid was checked. Today, pt reports some relief s/p injection. Continues to have difficulty with ADL's such as bathing d/t pain. Has been using Voltaren 1% with short-term relief.   Of note she is pretty adamant that she did not have an injection last visit.  I am pretty sure I did do an injection and on ultrasound imaging looks like there was an injection performed.  It is possible this was documented incorrectly but I am pretty sure she did have an injection.  Dx testing: 08/22/22 L hand XR & uric acid  Pertinent review of systems: No fevers or chills  Relevant historical information: Diabetes   Exam:  BP 98/60   Pulse 78   Ht 5\' 3"  (1.6 m)   Wt 190 lb (86.2 kg)   SpO2 96%   BMI 33.66 kg/m  General: Well Developed, well nourished, and in no acute distress.   MSK: Left hand and wrist some swelling especially at the first [facility].  Tender palpation.  Decreased wrist and hand motion.  Pain with wrist and hand motion. Pulses cap refill and sensation are intact distally.    Lab and Radiology Results  EXAM: LEFT HAND - COMPLETE 3+ VIEW   COMPARISON:  None Available.   FINDINGS: Mildly decreased bone mineralization. Moderate thumb carpometacarpal joint space narrowing, subchondral sclerosis, periprosthetic ptosis. Mild right first through fifth interphalangeal joint space narrowing and peripheral osteophytosis, greatest within the thumb interphalangeal joint and third DIP joints. 1.5 mm ulnar negative variance. No acute fracture is seen. No dislocation.   IMPRESSION: 1. Moderate thumb carpometacarpal osteoarthritis. 2. Mild first through fifth interphalangeal  osteoarthritis.     Electronically Signed   By: Neita Garnet M.D.   On: 08/26/2022 12:06 I, [name], personally (independently) visualized and performed the interpretation of the images attached in this note.  Lab Results  Component Value Date   LABURIC 5.4 08/22/2022     Assessment and Plan: 82 y.o. female with left hand and wrist pain.  Etiology is unclear.  She does have some degenerative changes especially at the first [facility].  This is likely a big source of pain.  She may have tendinitis or tenosynovitis as well.  Plan for trial of occupational therapy.  If not better would consider MRI arthrogram. Check in 1 month  PDMP not reviewed this encounter. Orders Placed This Encounter  Procedures   Ambulatory referral to Occupational Therapy    Referral Priority:   Routine    Referral Type:   Occupational Therapy    Referral Reason:   Specialty Services Required    Requested Specialty:   Occupational Therapy    Number of Visits Requested:   1   No orders of the defined types were placed in this encounter.    Discussed warning signs or symptoms. Please see discharge instructions. Patient expresses understanding.   The above documentation has been reviewed and is accurate and complete [name], M.D.

## 2022-09-10 NOTE — Patient Instructions (Addendum)
Thank you for coming in today.   I've referred you to Occupational Therapy.  Let us know if you don't hear from them in one week.   Check back in 1 monthGood to see you  See me again in

## 2022-09-18 ENCOUNTER — Ambulatory Visit: Payer: 59 | Admitting: Physician Assistant
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
## 2022-09-27 ENCOUNTER — Ambulatory Visit: Payer: 59 | Attending: Family Medicine | Admitting: Occupational Therapy

## 2022-09-27 DIAGNOSIS — M25532 Pain in left wrist: Secondary | ICD-10-CM | POA: Insufficient documentation

## 2022-09-27 DIAGNOSIS — M79642 Pain in left hand: Secondary | ICD-10-CM | POA: Diagnosis not present

## 2022-09-27 DIAGNOSIS — M6281 Muscle weakness (generalized): Secondary | ICD-10-CM | POA: Diagnosis not present

## 2022-09-27 DIAGNOSIS — M25632 Stiffness of left wrist, not elsewhere classified: Secondary | ICD-10-CM | POA: Diagnosis not present

## 2022-09-28 ENCOUNTER — Other Ambulatory Visit: Payer: Self-pay | Admitting: Family Medicine

## 2022-10-02 ENCOUNTER — Ambulatory Visit: Payer: 59 | Attending: Family Medicine | Admitting: Occupational Therapy

## 2022-10-02 DIAGNOSIS — M25632 Stiffness of left wrist, not elsewhere classified: Secondary | ICD-10-CM | POA: Insufficient documentation

## 2022-10-02 DIAGNOSIS — M25532 Pain in left wrist: Secondary | ICD-10-CM | POA: Insufficient documentation

## 2022-10-02 DIAGNOSIS — M6281 Muscle weakness (generalized): Secondary | ICD-10-CM | POA: Diagnosis not present

## 2022-10-02 DIAGNOSIS — M79642 Pain in left hand: Secondary | ICD-10-CM | POA: Insufficient documentation
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
## 2022-10-02 NOTE — Patient Instructions (Signed)
AROM: Wrist Extension    With left palm down, bend wrist up and down. Repeat __10__ times per set.  Do __3__ sessions per day. Relax fingers and thumb    FOR ALL BELOW THUMB EXERCISES, KEEP WRIST NEUTRAL and Pinky side down along table:   Thumb: MP Flexion    Bend left thumb to touch base of little finger keeping tip joint straight, like you are making the number "4". Hold __3__ seconds. Then come out to side like you are making the number "5" Repeat _10___ times. Do _3___ sessions per day.     Opposition (Active)    Touch tip of thumb to nail tip of each finger in turn, making an "O" shape. Repeat __5__ times. Do __3__ sessions per day.  Palmar Adduction/Abduction (Active)    Move thumb away/in front from palm. Move back to rest along palm. Repeat _10___ times. Do _3___ sessions per day.

## 2022-10-08 ENCOUNTER — Ambulatory Visit: Payer: 59 | Admitting: Family Medicine

## 2022-10-08 NOTE — Progress Notes (Deleted)
   Rubin Payor, PhD, LAT, ATC acting as a scribe for Clementeen Graham, MD.  [name] is a 82 y.o. female who presents to [facility] at [facility] today for f/u L wrist pain. Pt was last seen by Dr. Denyse Amass on 09/10/22 and was referred to OT, completing 2 visits. She was given a L dorsal radiocarpal steroid injection on 08/22/22.  Today, pt reports ***  Dx testing: 08/22/22 L hand XR & uric acid   Pertinent review of systems: ***  Relevant historical information: ***   Exam:  There were no vitals taken for this visit. General: Well Developed, well nourished, and in no acute distress.   MSK: ***    Lab and Radiology Results No results found for this or any previous visit (from the past 72 hour(s)). No results found.     Assessment and Plan: 82 y.o. female with ***   PDMP not reviewed this encounter. No orders of the defined types were placed in this encounter.  No orders of the defined types were placed in this encounter.    Discussed warning signs or symptoms. Please see discharge instructions. Patient expresses understanding.   ***

## 2022-10-16 ENCOUNTER — Ambulatory Visit: Payer: 59 | Admitting: Occupational Therapy

## 2022-10-18 ENCOUNTER — Ambulatory Visit: Payer: 59 | Admitting: Occupational Therapy

## 2022-10-18 DIAGNOSIS — M25532 Pain in left wrist: Secondary | ICD-10-CM

## 2022-10-18 DIAGNOSIS — M79642 Pain in left hand: Secondary | ICD-10-CM | POA: Diagnosis not present

## 2022-10-18 DIAGNOSIS — M25632 Stiffness of left wrist, not elsewhere classified: Secondary | ICD-10-CM | POA: Diagnosis not present

## 2022-10-18 DIAGNOSIS — M6281 Muscle weakness (generalized): Secondary | ICD-10-CM | POA: Diagnosis not present
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
## 2022-10-23 ENCOUNTER — Encounter: Payer: Self-pay | Admitting: Occupational Therapy

## 2022-10-23 ENCOUNTER — Ambulatory Visit: Payer: 59 | Admitting: Occupational Therapy

## 2022-10-23 DIAGNOSIS — M25532 Pain in left wrist: Secondary | ICD-10-CM | POA: Diagnosis not present

## 2022-10-23 DIAGNOSIS — M79642 Pain in left hand: Secondary | ICD-10-CM

## 2022-10-23 DIAGNOSIS — M6281 Muscle weakness (generalized): Secondary | ICD-10-CM

## 2022-10-23 DIAGNOSIS — M25632 Stiffness of left wrist, not elsewhere classified: Secondary | ICD-10-CM | POA: Diagnosis not present
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
## 2022-10-25 ENCOUNTER — Ambulatory Visit: Payer: 59 | Admitting: Occupational Therapy

## 2022-10-25 DIAGNOSIS — M25632 Stiffness of left wrist, not elsewhere classified: Secondary | ICD-10-CM | POA: Diagnosis not present

## 2022-10-25 DIAGNOSIS — M25532 Pain in left wrist: Secondary | ICD-10-CM

## 2022-10-25 DIAGNOSIS — M6281 Muscle weakness (generalized): Secondary | ICD-10-CM | POA: Diagnosis not present

## 2022-10-25 DIAGNOSIS — M79642 Pain in left hand: Secondary | ICD-10-CM | POA: Diagnosis not present
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
## 2022-10-25 NOTE — Patient Instructions (Signed)
  1. Grip Strengthening (Resistive Putty)   Squeeze putty using thumb and all fingers. Repeat _15-20___ times. Do __2__ sessions per day.   2. Roll putty into tube on table and pinch between first two fingers and thumb x 10 reps. Do 2 sessions per day   Extension (Resistive)    With wrist over edge of table, lift _1-2___lbs, keeping arm on table surface. Hold __3__ seconds. Lower slowly. Repeat __10__ times. Do _2___ sessions per day.   Wrist Flexion: Resisted    With left palm up, __1-2__ pound weight in hand, bend wrist up. Return slowly. Repeat __10__ times per set.  Do __2__ sessions per day.

## 2022-10-30 ENCOUNTER — Encounter: Payer: 59 | Admitting: Occupational Therapy

## 2022-11-01 ENCOUNTER — Encounter: Payer: Self-pay | Admitting: Family Medicine

## 2022-11-01 NOTE — Telephone Encounter (Signed)
error 
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
## 2022-11-05 ENCOUNTER — Ambulatory Visit (INDEPENDENT_AMBULATORY_CARE_PROVIDER_SITE_OTHER): Payer: 59 | Admitting: Family Medicine

## 2022-11-05 ENCOUNTER — Encounter: Payer: Self-pay | Admitting: Family Medicine

## 2022-11-05 VITALS — BP 118/70 | HR 94 | Temp 98.7°F | Resp 16 | Ht 63.0 in | Wt 193.5 lb

## 2022-11-05 DIAGNOSIS — I10 Essential (primary) hypertension: Secondary | ICD-10-CM

## 2022-11-05 DIAGNOSIS — N6002 Solitary cyst of left breast: Secondary | ICD-10-CM

## 2022-11-05 DIAGNOSIS — I700 Atherosclerosis of aorta: Secondary | ICD-10-CM | POA: Diagnosis not present

## 2022-11-05 DIAGNOSIS — K5900 Constipation, unspecified: Secondary | ICD-10-CM

## 2022-11-05 DIAGNOSIS — E1149 Type 2 diabetes mellitus with other diabetic neurological complication: Secondary | ICD-10-CM

## 2022-11-05 LAB — POCT GLYCOSYLATED HEMOGLOBIN (HGB A1C): HbA1c, POC (prediabetic range): 5.7 % (ref 5.7–6.4)

## 2022-11-05 NOTE — Assessment & Plan Note (Addendum)
Problem has been well controlled, HgA1C went from 5.5 to 5.7. Continue nonpharmacologic treatment.  Regular exercise as tolerated and healthy diet with avoidance of added sugar food intake to continue. Annual eye exam, periodic dental and foot care recommended. F/U in 5-6 months.

## 2022-11-05 NOTE — Assessment & Plan Note (Signed)
RLQ abdominal pain has resolved. Continue adequate fiber and fluid intake. She has Linzess 72 mcg at home, has not taken it, recommend doing so q 2-3 days as needed.

## 2022-11-05 NOTE — Patient Instructions (Addendum)
A few things to remember from today's visit:  Type 2 diabetes mellitus with neurological complications (HCC) - Plan: POC HgB A1c  Atherosclerosis of aorta (HCC)  Essential hypertension  Breast cyst, left Continue monitoring blood pressure at home and bring reading to your cardiologist and to use next visit. No changes today.  If you need refills for medications you take chronically, please call your pharmacy. Do not use My Chart to request refills or for acute issues that need immediate attention. If you send a my chart message, it may take a few days to be addressed, specially if I am not in the office.  Please be sure medication list is accurate. If a new problem present, please set up appointment sooner than planned today.

## 2022-11-05 NOTE — Assessment & Plan Note (Signed)
Today BP adequately controlled. She is reporting occasional episodes of hypotension. For now recommend continuing Irbesartan 75 mg daily and Carvedilol 25 mg bid as well as low salt diet. Monitor BP daily and bring readings to her next appt with cardiologist and here for next f/u. Eye exam is current.

## 2022-11-05 NOTE — Assessment & Plan Note (Signed)
It has been seen on abdominal CT 08/2017. Continue atorvastatin 40 mg daily. On chronic anticoagulation, Eliquis.

## 2022-11-05 NOTE — Assessment & Plan Note (Signed)
We discussed last mammogram report and [place] of left breast. Mammogram to be repeated 07/2023, before of she notes changes.

## 2022-11-06 ENCOUNTER — Encounter: Payer: 59 | Admitting: Occupational Therapy

## 2022-11-30 DIAGNOSIS — N182 Chronic kidney disease, stage 2 (mild): Secondary | ICD-10-CM | POA: Diagnosis not present

## 2022-11-30 DIAGNOSIS — I2510 Atherosclerotic heart disease of native coronary artery without angina pectoris: Secondary | ICD-10-CM | POA: Diagnosis not present

## 2022-11-30 DIAGNOSIS — E782 Mixed hyperlipidemia: Secondary | ICD-10-CM | POA: Diagnosis not present

## 2022-11-30 DIAGNOSIS — I10 Essential (primary) hypertension: Secondary | ICD-10-CM | POA: Diagnosis not present

## 2022-12-06 ENCOUNTER — Other Ambulatory Visit: Payer: Self-pay | Admitting: Family Medicine

## 2022-12-06 DIAGNOSIS — E1122 Type 2 diabetes mellitus with diabetic chronic kidney disease: Secondary | ICD-10-CM | POA: Diagnosis not present

## 2022-12-06 DIAGNOSIS — M797 Fibromyalgia: Secondary | ICD-10-CM

## 2022-12-06 DIAGNOSIS — I5030 Unspecified diastolic (congestive) heart failure: Secondary | ICD-10-CM | POA: Diagnosis not present

## 2022-12-06 DIAGNOSIS — N183 Chronic kidney disease, stage 3 unspecified: Secondary | ICD-10-CM | POA: Diagnosis not present

## 2022-12-06 DIAGNOSIS — I129 Hypertensive chronic kidney disease with stage 1 through stage 4 chronic kidney disease, or unspecified chronic kidney disease: Secondary | ICD-10-CM | POA: Diagnosis not present

## 2022-12-06 DIAGNOSIS — R609 Edema, unspecified: Secondary | ICD-10-CM | POA: Diagnosis not present

## 2022-12-06 DIAGNOSIS — M48062 Spinal stenosis, lumbar region with neurogenic claudication: Secondary | ICD-10-CM

## 2022-12-07 ENCOUNTER — Encounter (HOSPITAL_COMMUNITY): Payer: Self-pay | Admitting: Emergency Medicine

## 2022-12-07 ENCOUNTER — Emergency Department (HOSPITAL_COMMUNITY)
Admission: EM | Admit: 2022-12-07 | Discharge: 2022-12-08 | Disposition: A | Discharge status: Home / Self Care | Payer: 59

## 2022-12-07 ENCOUNTER — Other Ambulatory Visit: Payer: Self-pay

## 2022-12-07 ENCOUNTER — Emergency Department (HOSPITAL_COMMUNITY): Payer: 59

## 2022-12-07 DIAGNOSIS — R079 Chest pain, unspecified: Secondary | ICD-10-CM | POA: Diagnosis not present

## 2022-12-07 DIAGNOSIS — Z79899 Other long term (current) drug therapy: Secondary | ICD-10-CM | POA: Diagnosis not present

## 2022-12-07 DIAGNOSIS — E119 Type 2 diabetes mellitus without complications: Secondary | ICD-10-CM | POA: Diagnosis not present

## 2022-12-07 DIAGNOSIS — Z7901 Long term (current) use of anticoagulants: Secondary | ICD-10-CM | POA: Insufficient documentation

## 2022-12-07 DIAGNOSIS — I6782 Cerebral ischemia: Secondary | ICD-10-CM | POA: Diagnosis not present

## 2022-12-07 DIAGNOSIS — I10 Essential (primary) hypertension: Secondary | ICD-10-CM | POA: Diagnosis not present

## 2022-12-07 DIAGNOSIS — M797 Fibromyalgia: Secondary | ICD-10-CM | POA: Insufficient documentation

## 2022-12-07 DIAGNOSIS — Z8673 Personal history of transient ischemic attack (TIA), and cerebral infarction without residual deficits: Secondary | ICD-10-CM | POA: Diagnosis not present

## 2022-12-07 DIAGNOSIS — Z86711 Personal history of pulmonary embolism: Secondary | ICD-10-CM | POA: Diagnosis not present

## 2022-12-07 DIAGNOSIS — I509 Heart failure, unspecified: Secondary | ICD-10-CM | POA: Diagnosis not present

## 2022-12-07 DIAGNOSIS — R202 Paresthesia of skin: Secondary | ICD-10-CM | POA: Diagnosis not present

## 2022-12-07 DIAGNOSIS — E785 Hyperlipidemia, unspecified: Secondary | ICD-10-CM | POA: Insufficient documentation

## 2022-12-07 DIAGNOSIS — M79601 Pain in right arm: Secondary | ICD-10-CM | POA: Diagnosis not present

## 2022-12-07 DIAGNOSIS — R9431 Abnormal electrocardiogram [ECG] [EKG]: Secondary | ICD-10-CM | POA: Diagnosis not present

## 2022-12-07 DIAGNOSIS — I110 Hypertensive heart disease with heart failure: Secondary | ICD-10-CM | POA: Insufficient documentation

## 2022-12-07 LAB — CBC WITH DIFFERENTIAL/PLATELET
Abs Immature Granulocytes: 0.02 10*3/uL (ref 0.00–0.07)
Basophils Absolute: 0 10*3/uL (ref 0.0–0.1)
Basophils Relative: 0 %
Eosinophils Absolute: 0.1 10*3/uL (ref 0.0–0.5)
Eosinophils Relative: 2 %
HCT: 39.4 % (ref 36.0–46.0)
Hemoglobin: 12.2 g/dL (ref 12.0–15.0)
Immature Granulocytes: 0 %
Lymphocytes Relative: 42 %
Lymphs Abs: 2.8 10*3/uL (ref 0.7–4.0)
MCH: 28.7 pg (ref 26.0–34.0)
MCHC: 31 g/dL (ref 30.0–36.0)
MCV: 92.7 fL (ref 80.0–100.0)
Monocytes Absolute: 0.7 10*3/uL (ref 0.1–1.0)
Monocytes Relative: 11 %
Neutro Abs: 2.9 10*3/uL (ref 1.7–7.7)
Neutrophils Relative %: 45 %
Platelets: 165 10*3/uL (ref 150–400)
RBC: 4.25 MIL/uL (ref 3.87–5.11)
RDW: 13.9 % (ref 11.5–15.5)
WBC: 6.5 10*3/uL (ref 4.0–10.5)
nRBC: 0 % (ref 0.0–0.2)

## 2022-12-07 LAB — COMPREHENSIVE METABOLIC PANEL
ALT: 18 U/L (ref 0–44)
AST: 16 U/L (ref 15–41)
Albumin: 3 g/dL — ABNORMAL LOW (ref 3.5–5.0)
Alkaline Phosphatase: 89 U/L (ref 38–126)
Anion gap: 9 (ref 5–15)
BUN: 15 mg/dL (ref 8–23)
CO2: 26 mmol/L (ref 22–32)
Calcium: 9.1 mg/dL (ref 8.9–10.3)
Chloride: 105 mmol/L (ref 98–111)
Creatinine, Ser: 1.32 mg/dL — ABNORMAL HIGH (ref 0.44–1.00)
GFR, Estimated: 41 mL/min — ABNORMAL LOW (ref >60)
Glucose, Bld: 91 mg/dL (ref 70–99)
Potassium: 4.3 mmol/L (ref 3.5–5.1)
Sodium: 140 mmol/L (ref 135–145)
Total Bilirubin: 0.4 mg/dL (ref 0.3–1.2)
Total Protein: 6.5 g/dL (ref 6.5–8.1)

## 2022-12-07 LAB — TROPONIN I (HIGH SENSITIVITY)
Troponin I (High Sensitivity): 4 ng/L (ref <18)
Troponin I (High Sensitivity): 5 ng/L (ref <18)

## 2022-12-07 LAB — LAB REPORT - SCANNED
Albumin, Urine POC: 11.7
Albumin/Creatinine Ratio, Urine, POC: 8
Creatinine, POC: 138.5 mg/dL

## 2022-12-07 LAB — BRAIN NATRIURETIC PEPTIDE: B Natriuretic Peptide: 145 pg/mL — ABNORMAL HIGH (ref 0.0–100.0)

## 2022-12-07 MED ORDER — ACETAMINOPHEN 500 MG PO TABS
1000.0000 mg | ORAL_TABLET | Freq: Once | ORAL | Status: AC
  Administered 2022-12-07: 1000 mg via ORAL
  Filled 2022-12-07: qty 2

## 2022-12-07 NOTE — ED Notes (Signed)
2trop [id number]

## 2022-12-07 NOTE — ED Notes (Signed)
Pt started complaining of a headache without visual disturbances, n/v/, or dizziness.Provider notified via secure chat.

## 2022-12-07 NOTE — ED Triage Notes (Signed)
Pt reports she had labs drawn earlier today and when the technician was obtaining the blood she began to feel tingling in her arm/wrist, and hand.  Pt states she told the technician it would be fine and attempted moving her arm a lot, going about her day, and then began to have worsening pain and "pressure" up into her shoulder and upper arm.

## 2022-12-07 NOTE — ED Provider Notes (Signed)
[place] EMERGENCY DEPARTMENT AT [facility] Provider Note   CSN: [id number] Arrival date & time: 12/07/22  1554     History {Add pertinent medical, surgical, social history, OB history to HPI:1} Chief Complaint  Patient presents with   Arm Pain    [name] is a 82 y.o. female.   Arm Pain       Home Medications Prior to Admission medications   Medication Sig Start Date End Date Taking? Authorizing Provider  albuterol (PROAIR HFA) 108 (90 Base) MCG/ACT inhaler INHALE 2 PUFFS BY MOUTH EVERY 6 HOURS AS NEEDED FOR WHEEZING 09/26/21   [place], Betty G, MD  atorvastatin (LIPITOR) 40 MG tablet TAKE 1 TABLET(40 MG) BY MOUTH DAILY AT 6 PM 06/18/22   [place], Betty G, MD  carbamide peroxide (DEBROX) 6.5 % OTIC solution Place 5 drops into the left ear 2 (two) times daily. 05/28/22   Carlisle Beers, FNP  carvedilol (COREG) 25 MG tablet Take 12.5 mg by mouth 2 (two) times daily. 07/22/19   [provider]  diclofenac Sodium (VOLTAREN) 1 % GEL Apply 2 g topically 4 (four) times daily. 08/09/22   Long, Arlyss Repress, MD  DULoxetine (CYMBALTA) 60 MG capsule TAKE 1 CAPSULE(60 MG) BY MOUTH DAILY 12/07/22   [place], Betty G, MD  ELIQUIS 5 MG TABS tablet TAKE 1 TABLET(5 MG) BY MOUTH TWICE DAILY 09/28/22   [place], Betty G, MD  famotidine (PEPCID) 20 MG tablet TAKE 1 TABLET(20 MG) BY MOUTH AT BEDTIME 10/06/21   [place], Betty G, MD  furosemide (LASIX) 40 MG tablet TAKE 1 TABLET(40 MG) BY MOUTH DAILY Patient taking differently: Take 40 mg by mouth as needed for fluid. 05/01/21   [place], Betty G, MD  irbesartan (AVAPRO) 75 MG tablet Take 1 tablet (75 mg total) by mouth daily. 01/09/22   [place], Betty G, MD  LamoTRIgine 200 MG TB24 24 hour tablet TAKE 1 TABLET(200 MG) BY MOUTH AT BEDTIME 07/09/22   Glean Salvo, NP  linaclotide [facility]) 72 MCG capsule Take 1 capsule (72 mcg total) by mouth daily before breakfast. 07/27/22   [place], Betty G, MD  memantine (NAMENDA) 10 MG tablet  Take 1 tablet (10 mg total) by mouth 2 (two) times daily. 12/27/21   Glean Salvo, NP  montelukast (SINGULAIR) 10 MG tablet TAKE 1 TABLET(10 MG) BY MOUTH EVERY DAY IN THE EVENING 06/18/22   [place], Betty G, MD  nitroGLYCERIN (NITROSTAT) 0.4 MG SL tablet PLACE 1 TABLET UNDER TONGUE EVERY 5 MINUTES AS NEEDED FOR CHEST PAIN 09/28/22   [place], Betty G, MD  pantoprazole (PROTONIX) 40 MG tablet TAKE 1 TABLET(40 MG) BY MOUTH DAILY 06/18/22   [place], Betty G, MD  senna-docusate (SENOKOT-S) 8.6-50 MG tablet Take 1 tablet by mouth at bedtime as needed for mild constipation or moderate constipation. 08/09/22   Long, Arlyss Repress, MD      Allergies    Bee pollen and Pollen extract    Review of Systems   Review of Systems  Physical Exam Updated Vital Signs BP (!) 143/75   Pulse 67   Temp (!) 97.2 F (36.2 C) (Oral)   Resp 19   Ht 5\' 3"  (1.6 m)   Wt 88.9 kg   SpO2 100%   BMI 34.72 kg/m  Physical Exam  ED Results / Procedures / Treatments   Labs (all labs ordered are listed, but only abnormal results are displayed) Labs Reviewed  BRAIN NATRIURETIC PEPTIDE - Abnormal; Notable for the following  components:      Result Value   B Natriuretic Peptide 145.0 (*)    All other components within normal limits  COMPREHENSIVE METABOLIC PANEL - Abnormal; Notable for the following components:   Creatinine, Ser 1.32 (*)    Albumin 3.0 (*)    GFR, Estimated 41 (*)    All other components within normal limits  CBC WITH DIFFERENTIAL/PLATELET  CBC WITH DIFFERENTIAL/PLATELET  CBC WITH DIFFERENTIAL/PLATELET  TROPONIN I (HIGH SENSITIVITY)  TROPONIN I (HIGH SENSITIVITY)    EKG None  Radiology DG Chest 2 View  Result Date: 12/07/2022 CLINICAL DATA:  Right-sided chest pain. EXAM: CHEST - 2 VIEW COMPARISON:  September 26, 2021 FINDINGS: The heart size and mediastinal contours are within normal limits. Both lungs are clear. The visualized skeletal structures are unremarkable. IMPRESSION: No active cardiopulmonary  disease. Electronically Signed   By: Aram Candela M.D.   On: 12/07/2022 22:06    Procedures Procedures  {Document cardiac monitor, telemetry assessment procedure when appropriate:1}  Medications Ordered in ED Medications  acetaminophen (TYLENOL) tablet 1,000 mg (1,000 mg Oral Given 12/07/22 2243)    ED Course/ Medical Decision Making/ A&P   {   Click here for ABCD2, HEART and other calculatorsREFRESH Note before signing :1}                              Medical Decision Making Amount and/or Complexity of Data Reviewed Labs: ordered. Radiology: ordered.  Risk OTC drugs.   82 y.o. female presents to the ER for evaluation of ***. Differential diagnosis includes but is not limited to ***. Vital signs ***. Physical exam as noted above.   I independently reviewed and interpreted the patient's labs. ***.   Portions of this report may have been transcribed using voice recognition software. Every effort was made to ensure accuracy; however, inadvertent computerized transcription errors may be present.   I discussed this case with my attending physician who cosigned this note including patient's presenting symptoms, physical exam, and planned diagnostics and interventions. Attending physician stated agreement with plan or made changes to plan which were implemented.   Attending physician assessed patient at bedside.   Final Clinical Impression(s) / ED Diagnoses Final diagnoses:  None    Rx / DC Orders ED Discharge Orders     None

## 2022-12-08 NOTE — ED Provider Notes (Signed)
1:14 AM MRI negative for acute CVA. No other acute pathology noted. Patient instructed to f/u with outpatient neurology. Return precautions discussed and provided. Patient discharged in stable condition with no unaddressed concerns.   Vitals:   12/07/22 2215 12/07/22 2219 12/08/22 0030 12/08/22 0100  BP: (!) 143/75  (!) 157/70 (!) 151/63  Pulse: 67  69 61  Resp: 19  (!) 22 17  Temp:  (!) 97.2 F (36.2 C)    TempSrc:  Oral    SpO2: 100%  100% 100%  Weight:      Height:        MR BRAIN WO CONTRAST  Result Date: 12/08/2022 CLINICAL DATA:  Initial evaluation for TIA. EXAM: MRI HEAD WITHOUT CONTRAST TECHNIQUE: Multiplanar, multiecho pulse sequences of the brain and surrounding structures were obtained without intravenous contrast. COMPARISON:  Prior study from 02/13/2021. FINDINGS: Brain: Cerebral volume within normal limits. Patchy T2/FLAIR hyperintensity involving the periventricular and deep white matter of both cerebral hemispheres, consistent with chronic small vessel ischemic disease, mild for age. Mild patchy involvement of the pons. Few small remote bilateral cerebellar infarcts noted. Small remote lacunar infarcts present about the basal ganglia and thalami. No evidence for acute or subacute ischemia. Gray-white matter differentiation maintained. No areas of chronic cortical infarction. No acute or chronic intracranial blood products. No mass lesion, midline shift or mass effect. No hydrocephalus or extra-axial fluid collection. Pituitary gland suprasellar region within normal limits. Vascular: Major intracranial vascular flow voids are maintained. Skull and upper cervical spine: Craniocervical junction within normal limits. Bone marrow signal intensity normal. No scalp soft tissue abnormality. Sinuses/Orbits: Prior bilateral ocular lens replacement. Paranasal sinuses are largely clear. No mastoid effusion. Other: None. IMPRESSION: 1. No acute intracranial abnormality. 2. Mild chronic  microvascular ischemic disease with a few scattered remote lacunar infarcts about the deep gray nuclei and cerebellum. Electronically Signed   By: Rise Mu M.D.   On: 12/08/2022 01:09    Antony Madura, PA-C 12/08/22 0115    Zadie Rhine, MD 12/08/22 [phone]

## 2022-12-08 NOTE — ED Notes (Signed)
Patient is back from MRI. Stated she thought she was going home.

## 2022-12-08 NOTE — Discharge Instructions (Addendum)
You were seen in the ER today for evaluation of your right arm sensation changes. Your work up and imaging was reassuring. Please follow up with neurology for further evaluation. If you have any concerns, new or worsening symptoms, please return to the nearest ER for re-evaluation.   Contact a doctor if: You have paresthesia that gets worse or does not go away. You lose feeling (have numbness) after an injury. Your burning or prickling feeling gets worse when you walk. You have pain or cramps. You feel dizzy or you faint. You have a rash. Get help right away if: You feel weak or have new weakness in an arm or leg. You have trouble walking or moving. You have problems speaking, understanding, or seeing. You feel confused. You cannot control when you pee (urinate) or poop (have a bowel movement). These symptoms may be an emergency. Get help right away. Call 911. Do not wait to see if the symptoms will go away. Do not drive yourself to the hospital.
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
## 2022-12-12 ENCOUNTER — Encounter: Payer: Self-pay | Admitting: Family Medicine

## 2022-12-12 ENCOUNTER — Ambulatory Visit (INDEPENDENT_AMBULATORY_CARE_PROVIDER_SITE_OTHER): Payer: 59 | Admitting: Family Medicine

## 2022-12-12 VITALS — BP 128/70 | HR 100 | Resp 16 | Ht 63.0 in | Wt 194.0 lb

## 2022-12-12 DIAGNOSIS — R200 Anesthesia of skin: Secondary | ICD-10-CM

## 2022-12-12 DIAGNOSIS — R2681 Unsteadiness on feet: Secondary | ICD-10-CM

## 2022-12-12 DIAGNOSIS — R232 Flushing: Secondary | ICD-10-CM | POA: Diagnosis not present

## 2022-12-12 DIAGNOSIS — Z23 Encounter for immunization: Secondary | ICD-10-CM

## 2022-12-12 DIAGNOSIS — I10 Essential (primary) hypertension: Secondary | ICD-10-CM | POA: Diagnosis not present

## 2022-12-12 DIAGNOSIS — I519 Heart disease, unspecified: Secondary | ICD-10-CM | POA: Diagnosis not present

## 2022-12-12 DIAGNOSIS — H6993 Unspecified Eustachian tube disorder, bilateral: Secondary | ICD-10-CM

## 2022-12-12 LAB — TSH: TSH: 1.79 u[IU]/mL (ref 0.35–5.50)

## 2022-12-12 NOTE — Assessment & Plan Note (Signed)
BP in the ED on 12/07/22 143/75. Today adequately controlled and reporting similar readings at home. Continue Irbesartan 75 mg daily and Carvedilol 25 mg bid as well as low salt diet. Continue monitoring BP regularly.
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
## 2022-12-12 NOTE — Patient Instructions (Addendum)
A few things to remember from today's visit:  Essential hypertension  Hot flashes  Unstable gait  Diastolic dysfunction, left ventricle Monitor for new symptoms. Fall precautions.  If you need refills for medications you take chronically, please call your pharmacy. Do not use My Chart to request refills or for acute issues that need immediate attention. If you send a my chart message, it may take a few days to be addressed, specially if I am not in the office.  Please be sure medication list is accurate. If a new problem present, please set up appointment sooner than planned today.
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
## 2022-12-25 ENCOUNTER — Encounter: Payer: Self-pay | Admitting: Neurology

## 2022-12-25 ENCOUNTER — Ambulatory Visit (INDEPENDENT_AMBULATORY_CARE_PROVIDER_SITE_OTHER): Payer: 59 | Admitting: Neurology

## 2022-12-25 VITALS — BP 84/54 | HR 64 | Ht 63.0 in | Wt 194.8 lb

## 2022-12-25 DIAGNOSIS — R569 Unspecified convulsions: Secondary | ICD-10-CM | POA: Diagnosis not present

## 2022-12-25 DIAGNOSIS — G3184 Mild cognitive impairment, so stated: Secondary | ICD-10-CM

## 2022-12-25 MED ORDER — MEMANTINE HCL 10 MG PO TABS: 10.0000 mg | ORAL_TABLET | Freq: Two times a day (BID) | ORAL | 3 refills | Status: DC

## 2022-12-25 MED ORDER — LAMOTRIGINE ER 200 MG PO TB24: 200.0000 mg | ORAL_TABLET | Freq: Every day | ORAL | 4 refills | Status: DC

## 2022-12-25 NOTE — Patient Instructions (Signed)
Continue current medications, call for any seizures  Meds ordered this encounter  Medications   LamoTRIgine 200 MG TB24 24 hour tablet    Sig: Take 1 tablet (200 mg total) by mouth at bedtime.    Dispense:  90 tablet    Refill:  4   memantine (NAMENDA) 10 MG tablet    Sig: Take 1 tablet (10 mg total) by mouth 2 (two) times daily.    Dispense:  180 tablet    Refill:  3

## 2023-01-01 ENCOUNTER — Telehealth: Payer: Self-pay | Admitting: Neurology

## 2023-01-01 NOTE — Telephone Encounter (Signed)
Pt states on last appointment she was told by Maralyn Sago, NP that she was having short strokes in her head.  Pt is asking if there is a medication Sarah, NP will prescribe for that or if there nothing that can be prescribed.  Pt also asking if what she is prescribed for her seizures (LamoTRIgine 200 MG TB24 24 hour tablet) can help with her short strokes, please call pt to discuss.

## 2023-01-01 NOTE — Telephone Encounter (Signed)
Attempt one. Called patient and got no answer and unable to leave vm.

## 2023-01-02 NOTE — Telephone Encounter (Signed)
Called and unable to get through or LVM, attempt 2

## 2023-01-04 ENCOUNTER — Telehealth: Payer: Self-pay

## 2023-01-04 NOTE — Telephone Encounter (Signed)
Transition Care Management Unsuccessful Follow-up Telephone Call  Date of discharge and from where:  [facility] 9/7  Attempts:  1st Attempt  Reason for unsuccessful TCM follow-up call:  No answer/busy   Lenard Forth [place]  [facility], [facility] Guide, Phone: [phone] Website: [email or web address]

## 2023-01-07 ENCOUNTER — Telehealth: Payer: Self-pay

## 2023-01-07 NOTE — Telephone Encounter (Signed)
Transition Care Management Unsuccessful Follow-up Telephone Call  Date of discharge and from where:  [facility]  Attempts:  2nd Attempt  Reason for unsuccessful TCM follow-up call:  No answer/busy   Lenard Forth [place]  [facility], [facility] Guide, Phone: [phone] Website: [email or web address]

## 2023-01-08 NOTE — Telephone Encounter (Signed)
Called and unable to leave voicemail, "call not be completed" message came on. Will wait for return call at this time. Third Attempt

## 2023-01-24 ENCOUNTER — Telehealth: Payer: Self-pay | Admitting: Family Medicine

## 2023-01-24 DIAGNOSIS — N644 Mastodynia: Secondary | ICD-10-CM

## 2023-01-24 NOTE — Telephone Encounter (Signed)
Pt had mammogram in April 2024 and having right breast pain and would like a referral to see female gyn

## 2023-01-25 NOTE — Telephone Encounter (Signed)
Referral placed.

## 2023-02-24 ENCOUNTER — Other Ambulatory Visit: Payer: Self-pay | Admitting: Family Medicine

## 2023-03-08 DIAGNOSIS — I2510 Atherosclerotic heart disease of native coronary artery without angina pectoris: Secondary | ICD-10-CM | POA: Diagnosis not present

## 2023-03-08 DIAGNOSIS — I10 Essential (primary) hypertension: Secondary | ICD-10-CM | POA: Diagnosis not present

## 2023-03-08 DIAGNOSIS — R0789 Other chest pain: Secondary | ICD-10-CM | POA: Diagnosis not present

## 2023-03-08 DIAGNOSIS — E782 Mixed hyperlipidemia: Secondary | ICD-10-CM | POA: Diagnosis not present

## 2023-03-18 ENCOUNTER — Telehealth: Payer: Self-pay

## 2023-03-18 NOTE — Telephone Encounter (Signed)
Received fax from Access Nurse about pt. Pt wanted to know what to do about her dizziness. They tried to contact patient x 4 times with no answer and leaving a message. I tried to contact patient, but unable to leave a message.

## 2023-03-20 ENCOUNTER — Other Ambulatory Visit: Payer: Self-pay | Admitting: Family Medicine

## 2023-03-22 ENCOUNTER — Other Ambulatory Visit: Payer: Self-pay | Admitting: Family Medicine

## 2023-05-19 ENCOUNTER — Other Ambulatory Visit: Payer: Self-pay

## 2023-05-19 ENCOUNTER — Encounter (HOSPITAL_COMMUNITY): Payer: Self-pay

## 2023-05-19 ENCOUNTER — Emergency Department (HOSPITAL_COMMUNITY)
Admission: EM | Admit: 2023-05-19 | Discharge: 2023-05-19 | Disposition: A | Discharge status: Home / Self Care | Payer: 59 | Attending: Emergency Medicine | Admitting: Emergency Medicine

## 2023-05-19 ENCOUNTER — Emergency Department (HOSPITAL_COMMUNITY): Payer: 59

## 2023-05-19 DIAGNOSIS — I959 Hypotension, unspecified: Secondary | ICD-10-CM | POA: Insufficient documentation

## 2023-05-19 DIAGNOSIS — M791 Myalgia, unspecified site: Secondary | ICD-10-CM

## 2023-05-19 DIAGNOSIS — Z79899 Other long term (current) drug therapy: Secondary | ICD-10-CM | POA: Diagnosis not present

## 2023-05-19 DIAGNOSIS — Z7901 Long term (current) use of anticoagulants: Secondary | ICD-10-CM | POA: Diagnosis not present

## 2023-05-19 DIAGNOSIS — R531 Weakness: Secondary | ICD-10-CM | POA: Diagnosis not present

## 2023-05-19 DIAGNOSIS — R600 Localized edema: Secondary | ICD-10-CM | POA: Insufficient documentation

## 2023-05-19 DIAGNOSIS — E7800 Pure hypercholesterolemia, unspecified: Secondary | ICD-10-CM | POA: Insufficient documentation

## 2023-05-19 DIAGNOSIS — I509 Heart failure, unspecified: Secondary | ICD-10-CM | POA: Diagnosis not present

## 2023-05-19 DIAGNOSIS — I10 Essential (primary) hypertension: Secondary | ICD-10-CM | POA: Diagnosis not present

## 2023-05-19 DIAGNOSIS — I2510 Atherosclerotic heart disease of native coronary artery without angina pectoris: Secondary | ICD-10-CM | POA: Insufficient documentation

## 2023-05-19 DIAGNOSIS — R079 Chest pain, unspecified: Secondary | ICD-10-CM | POA: Diagnosis not present

## 2023-05-19 LAB — CBC WITH DIFFERENTIAL/PLATELET
Abs Immature Granulocytes: 0.03 10*3/uL (ref 0.00–0.07)
Basophils Absolute: 0 10*3/uL (ref 0.0–0.1)
Basophils Relative: 1 %
Eosinophils Absolute: 0.1 10*3/uL (ref 0.0–0.5)
Eosinophils Relative: 1 %
HCT: 43.2 % (ref 36.0–46.0)
Hemoglobin: 13.6 g/dL (ref 12.0–15.0)
Immature Granulocytes: 0 %
Lymphocytes Relative: 27 %
Lymphs Abs: 1.9 10*3/uL (ref 0.7–4.0)
MCH: 29.3 pg (ref 26.0–34.0)
MCHC: 31.5 g/dL (ref 30.0–36.0)
MCV: 93.1 fL (ref 80.0–100.0)
Monocytes Absolute: 0.6 10*3/uL (ref 0.1–1.0)
Monocytes Relative: 8 %
Neutro Abs: 4.3 10*3/uL (ref 1.7–7.7)
Neutrophils Relative %: 63 %
Platelets: 188 10*3/uL (ref 150–400)
RBC: 4.64 MIL/uL (ref 3.87–5.11)
RDW: 14.1 % (ref 11.5–15.5)
WBC: 6.9 10*3/uL (ref 4.0–10.5)
nRBC: 0 % (ref 0.0–0.2)

## 2023-05-19 LAB — URINALYSIS, ROUTINE W REFLEX MICROSCOPIC
Bilirubin Urine: NEGATIVE
Glucose, UA: NEGATIVE mg/dL
Hgb urine dipstick: NEGATIVE
Ketones, ur: NEGATIVE mg/dL
Leukocytes,Ua: NEGATIVE
Nitrite: NEGATIVE
Protein, ur: NEGATIVE mg/dL
Specific Gravity, Urine: 1.005 (ref 1.005–1.030)
pH: 6 (ref 5.0–8.0)

## 2023-05-19 LAB — COMPREHENSIVE METABOLIC PANEL
ALT: 15 U/L (ref 0–44)
AST: 22 U/L (ref 15–41)
Albumin: 2.9 g/dL — ABNORMAL LOW (ref 3.5–5.0)
Alkaline Phosphatase: 79 U/L (ref 38–126)
Anion gap: 8 (ref 5–15)
BUN: 19 mg/dL (ref 8–23)
CO2: 24 mmol/L (ref 22–32)
Calcium: 9.1 mg/dL (ref 8.9–10.3)
Chloride: 106 mmol/L (ref 98–111)
Creatinine, Ser: 1.69 mg/dL — ABNORMAL HIGH (ref 0.44–1.00)
GFR, Estimated: 30 mL/min — ABNORMAL LOW (ref >60)
Glucose, Bld: 93 mg/dL (ref 70–99)
Potassium: 4.6 mmol/L (ref 3.5–5.1)
Sodium: 138 mmol/L (ref 135–145)
Total Bilirubin: 1.1 mg/dL (ref 0.0–1.2)
Total Protein: 6.6 g/dL (ref 6.5–8.1)

## 2023-05-19 LAB — RESP PANEL BY RT-PCR (RSV, FLU A&B, COVID)  RVPGX2
Influenza A by PCR: NEGATIVE
Influenza B by PCR: NEGATIVE
Resp Syncytial Virus by PCR: NEGATIVE
SARS Coronavirus 2 by RT PCR: NEGATIVE

## 2023-05-19 LAB — MAGNESIUM: Magnesium: 2.2 mg/dL (ref 1.7–2.4)

## 2023-05-19 LAB — BRAIN NATRIURETIC PEPTIDE: B Natriuretic Peptide: 136.4 pg/mL — ABNORMAL HIGH (ref 0.0–100.0)

## 2023-05-19 LAB — TROPONIN I (HIGH SENSITIVITY): Troponin I (High Sensitivity): 4 ng/L (ref <18)

## 2023-05-19 LAB — CK: Total CK: 111 U/L (ref 38–234)

## 2023-05-19 MED ORDER — NAPROXEN 500 MG PO TABS: 500.0000 mg | ORAL_TABLET | Freq: Two times a day (BID) | ORAL | 0 refills | Status: DC

## 2023-05-19 MED ORDER — SODIUM CHLORIDE 0.9 % IV BOLUS
500.0000 mL | Freq: Once | INTRAVENOUS | Status: AC
  Administered 2023-05-19: 500 mL via INTRAVENOUS

## 2023-05-19 NOTE — ED Notes (Signed)
Orthostatic VS  LYING- BP- 103/66 P-68  SITTING- BP 100/68 P- 80  STANDING- BP 85/53 P-89

## 2023-05-19 NOTE — ED Provider Notes (Signed)
[place] EMERGENCY DEPARTMENT AT [facility] Provider Note   CSN: [id number] Arrival date & time: 05/19/23  1048     History  Chief Complaint  Patient presents with   Hypotension   Generalized Body Aches    [name] is a 83 y.o. female.  HPI   This patient is an 83 year old female with a known history of hypercholesterolemia, she is on Lasix for heart failure, she is on Eliquis, she also takes lamotrigine and pantoprazole.  The patient last had a stress test in July 2020 which showed no reversible ischemia or infarction and normal left ventricular wall motion with a normal ejection fraction.  The patient did have a heart catheterization in 2017, she has chronic total occlusion of the dominant right coronary, there is lots of collateral flow moderate proximal to mid LAD stenosis as well as a 70% mid circumflex lesion.  It was recommended that she be on antiplatelet therapy, she does have a history of DVT  Today she complains of having generalized weakness and diffuse myalgias, she arrives by private vehicle, she states this has been going on for 2 weeks but felt a little bit worse today, she denies coughing or shortness of breath, denies orthopnea, denies increasing swelling of the legs, denies dysuria or diarrhea, her appetite has been okay, she has no bleeding.  On arrival the patient was noted to be slightly hypotensive at 93/52 with a heart rate of 83  Home Medications Prior to Admission medications   Medication Sig Start Date End Date Taking? Authorizing Provider  naproxen (NAPROSYN) 500 MG tablet Take 1 tablet (500 mg total) by mouth 2 (two) times daily with a meal. 05/19/23  Yes Eber Hong, MD  albuterol (VENTOLIN HFA) 108 (90 Base) MCG/ACT inhaler INHALE 2 PUFFS BY MOUTH EVERY 6 HOURS AS NEEDED FOR WHEEZING 02/25/23   [place], Betty G, MD  atorvastatin (LIPITOR) 40 MG tablet TAKE 1 TABLET(40 MG) BY MOUTH DAILY AT 6 PM 03/22/23   [place], Betty G, MD   carbamide peroxide (DEBROX) 6.5 % OTIC solution Place 5 drops into the left ear 2 (two) times daily. 05/28/22   Carlisle Beers, FNP  carvedilol (COREG) 25 MG tablet Take 12.5 mg by mouth 2 (two) times daily. 07/22/19   [provider]  diclofenac Sodium (VOLTAREN) 1 % GEL Apply 2 g topically 4 (four) times daily. 08/09/22   Long, Arlyss Repress, MD  DULoxetine (CYMBALTA) 60 MG capsule TAKE 1 CAPSULE(60 MG) BY MOUTH DAILY 12/07/22   [place], Betty G, MD  ELIQUIS 5 MG TABS tablet TAKE 1 TABLET(5 MG) BY MOUTH TWICE DAILY 09/28/22   [place], Betty G, MD  famotidine (PEPCID) 20 MG tablet TAKE 1 TABLET(20 MG) BY MOUTH AT BEDTIME 10/06/21   [place], Betty G, MD  furosemide (LASIX) 40 MG tablet TAKE 1 TABLET(40 MG) BY MOUTH DAILY Patient taking differently: Take 40 mg by mouth as needed for fluid. 05/01/21   [place], Betty G, MD  irbesartan (AVAPRO) 75 MG tablet Take 1 tablet (75 mg total) by mouth daily. 01/09/22   [place], Betty G, MD  LamoTRIgine 200 MG TB24 24 hour tablet Take 1 tablet (200 mg total) by mouth at bedtime. 12/25/22   Glean Salvo, NP  linaclotide [facility]) 72 MCG capsule Take 1 capsule (72 mcg total) by mouth daily before breakfast. 07/27/22   [place], Betty G, MD  memantine (NAMENDA) 10 MG tablet Take 1 tablet (10 mg total) by mouth 2 (two) times daily.  12/25/22   Glean Salvo, NP  montelukast (SINGULAIR) 10 MG tablet TAKE 1 TABLET(10 MG) BY MOUTH EVERY DAY IN THE EVENING 03/22/23   [place], Betty G, MD  nitroGLYCERIN (NITROSTAT) 0.4 MG SL tablet PLACE 1 TABLET UNDER THE TONGUE EVERY 5 MINUTES AS NEEDED FOR CHEST PAIN 03/25/23   [place], Betty G, MD  pantoprazole (PROTONIX) 40 MG tablet TAKE 1 TABLET(40 MG) BY MOUTH DAILY 03/20/23   [place], Betty G, MD  senna-docusate (SENOKOT-S) 8.6-50 MG tablet Take 1 tablet by mouth at bedtime as needed for mild constipation or moderate constipation. 08/09/22   Long, Arlyss Repress, MD      Allergies    Bee pollen and Pollen extract    Review of Systems    Review of Systems  All other systems reviewed and are negative.   Physical Exam Updated Vital Signs BP 138/65   Pulse 70   Temp (!) 97.5 F (36.4 C) (Oral)   Resp 19   Ht 1.6 m (5\' 3" )   Wt 90.7 kg   SpO2 97%   BMI 35.43 kg/m  Physical Exam Vitals and nursing note reviewed.  Constitutional:      General: She is not in acute distress.    Appearance: She is well-developed.  HENT:     Head: Normocephalic and atraumatic.     Mouth/Throat:     Pharynx: No oropharyngeal exudate.  Eyes:     General: No scleral icterus.       Right eye: No discharge.        Left eye: No discharge.     Conjunctiva/sclera: Conjunctivae normal.     Pupils: Pupils are equal, round, and reactive to light.  Neck:     Thyroid: No thyromegaly.     Vascular: No JVD.  Cardiovascular:     Rate and Rhythm: Normal rate and regular rhythm.     Heart sounds: Normal heart sounds. No murmur heard.    No friction rub. No gallop.  Pulmonary:     Effort: Pulmonary effort is normal. No respiratory distress.     Breath sounds: Normal breath sounds. No wheezing or rales.  Abdominal:     General: Bowel sounds are normal. There is no distension.     Palpations: Abdomen is soft. There is no mass.     Tenderness: There is no abdominal tenderness.  Musculoskeletal:        General: No tenderness. Normal range of motion.     Cervical back: Normal range of motion and neck supple.     Right lower leg: No edema.     Left lower leg: No edema.     Comments: Bilateral lower extremities are diffusely obese with nonpitting edema  Lymphadenopathy:     Cervical: No cervical adenopathy.  Skin:    General: Skin is warm and dry.     Findings: No erythema or rash.  Neurological:     Mental Status: She is alert.     Coordination: Coordination normal.  Psychiatric:        Behavior: Behavior normal.     ED Results / Procedures / Treatments   Labs (all labs ordered are listed, but only abnormal results are  displayed) Labs Reviewed  COMPREHENSIVE METABOLIC PANEL - Abnormal; Notable for the following components:      Result Value   Creatinine, Ser 1.69 (*)    Albumin 2.9 (*)    GFR, Estimated 30 (*)    All other components within normal limits  BRAIN NATRIURETIC PEPTIDE - Abnormal; Notable for the following components:   B Natriuretic Peptide 136.4 (*)    All other components within normal limits  RESP PANEL BY RT-PCR (RSV, FLU A&B, COVID)  RVPGX2  CBC WITH DIFFERENTIAL/PLATELET  MAGNESIUM  URINALYSIS, ROUTINE W REFLEX MICROSCOPIC  CK  TROPONIN I (HIGH SENSITIVITY)    EKG EKG Interpretation Date/Time:  Sunday May 19 2023 11:24:52 EST Ventricular Rate:  69 PR Interval:  211 QRS Duration:  65 QT Interval:  383 QTC Calculation: 411 R Axis:   29  Text Interpretation: Sinus rhythm Low voltage, precordial leads since last tracing no significant change Confirmed by Eber Hong ([id number]) on 05/19/2023 11:29:30 AM  Radiology DG Chest Port 1 View Result Date: 05/19/2023 CLINICAL DATA:  Weakness.  Pain all over for 2 weeks. EXAM: PORTABLE CHEST 1 VIEW COMPARISON:  12/07/2022 FINDINGS: Heart size and mediastinal contours appear normal. No pleural fluid, interstitial edema or airspace disease. The visualized osseous structures are unremarkable. IMPRESSION: No active disease. Electronically Signed   By: Signa Kell M.D.   On: 05/19/2023 12:48    Procedures Procedures    Medications Ordered in ED Medications  sodium chloride 0.9 % bolus 500 mL (0 mLs Intravenous Stopped 05/19/23 1258)    ED Course/ Medical Decision Making/ A&P                                 Medical Decision Making Amount and/or Complexity of Data Reviewed Labs: ordered. Radiology: ordered. ECG/medicine tests: ordered.    This patient presents to the ED for concern of weakness and myalgias, this involves an extensive number of treatment options, and is a complaint that carries with it a high risk of  complications and morbidity.  The differential diagnosis includes influenza, infection, dehydration, rhabdomyolysis, electrolyte abnormalities, urinary infection, she does not have any focal symptoms to suggest stroke   Co morbidities that complicate the patient evaluation  Heart diease   Additional history obtained:  Additional history obtained from medical record. External records from outside source obtained and reviewed including prior evaluations from cardiology. Followed by Dr. Sharyn Lull with cardiology   Lab Tests:  I Ordered, and personally interpreted labs.  The pertinent results include:   Urinalysis without any acute findings, CBC without leukocytosis or anemia, metabolic panel with creatinine which is close to baseline and no significant uremia, LFTs are normal, troponin is normal, BNP is normal, magnesium is normal, CK is normal, flu and COVID samples are negative   Imaging Studies ordered:  I ordered imaging studies including chest x-ray I independently visualized and interpreted imaging which showed no acute findings to suggest pneumonia I agree with the radiologist interpretation   Cardiac Monitoring: / EKG:  The patient was maintained on a cardiac monitor.  I personally viewed and interpreted the cardiac monitored which showed an underlying rhythm of: Normal sinus rhythm    Problem List / ED Course / Critical interventions / Medication management  I reexamined the patient several times, she remained stable, her blood pressure was 138/65 with a normal heart rate normal oxygen and she states that she is feeling better.  There was no definite etiology of generalized weakness and myalgias, nothing pathological came up on workup and the patient was informed of this as was the daughter at the bedside with the patient's permission.  At this time the patient stable for discharge, she is agreeable to the plan, stable at  this time I have reviewed the patients home medicines  and have made adjustments as needed   Social Determinants of Health:  none   Test / Admission - Considered:  Considered admission but the patient appears stable and comfortable with the plan for discharge         Final Clinical Impression(s) / ED Diagnoses Final diagnoses:  Myalgia    Rx / DC Orders ED Discharge Orders          Ordered    naproxen (NAPROSYN) 500 MG tablet  2 times daily with meals        05/19/23 1454              Eber Hong, MD 05/19/23 1454

## 2023-05-19 NOTE — Discharge Instructions (Signed)
All of your testing was reassuring, we did not find a cause of your symptoms, all of your test were normal, your heart rate and your blood pressure was normal.  If you do develop severe or worsening symptoms return to the ER  Thank you for allowing [place] to treat you in the emergency department today.  After reviewing your examination and potential testing that was done it appears that you are safe to go home.  I would like for you to follow-up with your doctor within the next several days, have them obtain your records and follow-up with them to review all potential tests and results from your visit.  If you should develop severe or worsening symptoms return to the emergency department immediately

## 2023-05-19 NOTE — ED Triage Notes (Signed)
Pt arrived POV from home c/o pain all over x2 weeks that has been intermittent. Pt is slightly hypotensive in triage. Pt states she is due to have mouth surgery next week but she just does not feel right.

## 2023-05-29 DIAGNOSIS — M2012 Hallux valgus (acquired), left foot: Secondary | ICD-10-CM | POA: Diagnosis not present

## 2023-05-29 DIAGNOSIS — M2011 Hallux valgus (acquired), right foot: Secondary | ICD-10-CM | POA: Diagnosis not present

## 2023-05-29 DIAGNOSIS — I739 Peripheral vascular disease, unspecified: Secondary | ICD-10-CM | POA: Diagnosis not present

## 2023-05-29 DIAGNOSIS — B351 Tinea unguium: Secondary | ICD-10-CM | POA: Diagnosis not present

## 2023-05-29 DIAGNOSIS — L603 Nail dystrophy: Secondary | ICD-10-CM | POA: Diagnosis not present

## 2023-06-03 ENCOUNTER — Other Ambulatory Visit: Payer: Self-pay | Admitting: Family Medicine

## 2023-06-03 DIAGNOSIS — M48062 Spinal stenosis, lumbar region with neurogenic claudication: Secondary | ICD-10-CM

## 2023-06-03 DIAGNOSIS — M797 Fibromyalgia: Secondary | ICD-10-CM

## 2023-06-06 NOTE — Progress Notes (Signed)
 Needed for critical evaluation

## 2023-06-07 ENCOUNTER — Encounter: Payer: Self-pay | Admitting: Family Medicine

## 2023-06-07 ENCOUNTER — Ambulatory Visit: Admitting: Family Medicine

## 2023-06-07 VITALS — BP 120/70 | HR 88 | Resp 16 | Ht 63.0 in | Wt 200.0 lb

## 2023-06-07 DIAGNOSIS — N1832 Chronic kidney disease, stage 3b: Secondary | ICD-10-CM | POA: Diagnosis not present

## 2023-06-07 DIAGNOSIS — R569 Unspecified convulsions: Secondary | ICD-10-CM | POA: Diagnosis not present

## 2023-06-07 DIAGNOSIS — I10 Essential (primary) hypertension: Secondary | ICD-10-CM | POA: Diagnosis not present

## 2023-06-07 DIAGNOSIS — M159 Polyosteoarthritis, unspecified: Secondary | ICD-10-CM

## 2023-06-07 DIAGNOSIS — E782 Mixed hyperlipidemia: Secondary | ICD-10-CM | POA: Diagnosis not present

## 2023-06-07 DIAGNOSIS — M797 Fibromyalgia: Secondary | ICD-10-CM

## 2023-06-07 DIAGNOSIS — I2510 Atherosclerotic heart disease of native coronary artery without angina pectoris: Secondary | ICD-10-CM | POA: Diagnosis not present

## 2023-06-07 DIAGNOSIS — I2699 Other pulmonary embolism without acute cor pulmonale: Secondary | ICD-10-CM | POA: Diagnosis not present

## 2023-06-07 DIAGNOSIS — F331 Major depressive disorder, recurrent, moderate: Secondary | ICD-10-CM | POA: Diagnosis not present

## 2023-06-07 NOTE — Patient Instructions (Addendum)
 A few things to remember from today's visit:  Generalized osteoarthritis of multiple sites  Stage 3b chronic kidney disease (HCC)  Fibromyalgia  Seizure (HCC)  Caution with driving. No changes today. No medications like naproxen or Ibuprofen, just acetaminophen for pain.  If you need refills for medications you take chronically, please call your pharmacy. Do not use My Chart to request refills or for acute issues that need immediate attention. If you send a my chart message, it may take a few days to be addressed, specially if I am not in the office.  Please be sure medication list is accurate. If a new problem present, please set up appointment sooner than planned today.
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
## 2023-06-08 NOTE — Assessment & Plan Note (Signed)
 We discussed Dx,prognosis,and treatment. Continue adequate sleep hygiene, low impact exercise,and Duloxetine 60 mg daily.

## 2023-06-08 NOTE — Assessment & Plan Note (Signed)
 She did not take her medication for a week, resumed this morning. Has not had a seizure episode in years, advised caution with driving. Continue Lamictal 200 mg daily. Following with neurologist.

## 2023-06-08 NOTE — Assessment & Plan Note (Addendum)
 No signs of synovitis. Continue Duloxetine 60 mg daily and low impact exercise as tolerated. Acetaminophen 500 gm 3-4 times per day as needed. Avoid NSAID's due to hx of HTN and CKD. Some side effects discussed.

## 2023-06-08 NOTE — Assessment & Plan Note (Signed)
 She feels like problem is mildly worse for the past few days. Could had be aggravated by missing doses of Lamotrigine 200 mg. She can arrange appt for CBT at [facility]. Continue Duloxetine 60 mg daily.

## 2023-06-09 DIAGNOSIS — L603 Nail dystrophy: Secondary | ICD-10-CM | POA: Diagnosis not present

## 2023-06-11 ENCOUNTER — Other Ambulatory Visit: Payer: Self-pay | Admitting: Family Medicine

## 2023-06-11 DIAGNOSIS — Z1231 Encounter for screening mammogram for malignant neoplasm of breast: Secondary | ICD-10-CM

## 2023-06-15 ENCOUNTER — Other Ambulatory Visit: Payer: Self-pay | Admitting: Family Medicine

## 2023-06-26 DIAGNOSIS — I129 Hypertensive chronic kidney disease with stage 1 through stage 4 chronic kidney disease, or unspecified chronic kidney disease: Secondary | ICD-10-CM | POA: Diagnosis not present

## 2023-06-26 DIAGNOSIS — M2012 Hallux valgus (acquired), left foot: Secondary | ICD-10-CM | POA: Diagnosis not present

## 2023-06-26 DIAGNOSIS — I739 Peripheral vascular disease, unspecified: Secondary | ICD-10-CM | POA: Diagnosis not present

## 2023-06-26 DIAGNOSIS — N183 Chronic kidney disease, stage 3 unspecified: Secondary | ICD-10-CM | POA: Diagnosis not present

## 2023-06-26 DIAGNOSIS — R609 Edema, unspecified: Secondary | ICD-10-CM | POA: Diagnosis not present

## 2023-06-26 DIAGNOSIS — E1122 Type 2 diabetes mellitus with diabetic chronic kidney disease: Secondary | ICD-10-CM | POA: Diagnosis not present

## 2023-06-26 DIAGNOSIS — I5030 Unspecified diastolic (congestive) heart failure: Secondary | ICD-10-CM | POA: Diagnosis not present

## 2023-06-28 ENCOUNTER — Ambulatory Visit
Admission: RE | Admit: 2023-06-28 | Discharge: 2023-06-28 | Disposition: A | Discharge status: Home / Self Care | Source: Ambulatory Visit | Attending: Family Medicine | Admitting: Family Medicine

## 2023-06-28 DIAGNOSIS — Z1231 Encounter for screening mammogram for malignant neoplasm of breast: Secondary | ICD-10-CM

## 2023-07-01 ENCOUNTER — Ambulatory Visit (HOSPITAL_COMMUNITY)
Admission: EM | Admit: 2023-07-01 | Discharge: 2023-07-01 | Disposition: A | Discharge status: Home / Self Care | Attending: Family Medicine | Admitting: Family Medicine

## 2023-07-01 ENCOUNTER — Ambulatory Visit (INDEPENDENT_AMBULATORY_CARE_PROVIDER_SITE_OTHER)

## 2023-07-01 ENCOUNTER — Encounter (HOSPITAL_COMMUNITY): Payer: Self-pay | Admitting: Emergency Medicine

## 2023-07-01 DIAGNOSIS — M25552 Pain in left hip: Secondary | ICD-10-CM

## 2023-07-01 DIAGNOSIS — M160 Bilateral primary osteoarthritis of hip: Secondary | ICD-10-CM | POA: Diagnosis not present

## 2023-07-01 DIAGNOSIS — M1612 Unilateral primary osteoarthritis, left hip: Secondary | ICD-10-CM

## 2023-07-01 DIAGNOSIS — M79605 Pain in left leg: Secondary | ICD-10-CM | POA: Diagnosis not present

## 2023-07-01 DIAGNOSIS — M47816 Spondylosis without myelopathy or radiculopathy, lumbar region: Secondary | ICD-10-CM | POA: Diagnosis not present

## 2023-07-01 MED ORDER — HYDROCODONE-ACETAMINOPHEN 5-325 MG PO TABS: 1.0000 | ORAL_TABLET | Freq: Four times a day (QID) | ORAL | 0 refills | Status: DC | PRN

## 2023-07-01 MED ORDER — METHYLPREDNISOLONE 4 MG PO TBPK: ORAL_TABLET | ORAL | 0 refills | Status: DC
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
## 2023-07-01 NOTE — Discharge Instructions (Addendum)
 There is arthritis on the x-ray of your hip.  This very likely is causing at least part of your symptoms.  Take the Medrol Dosepak as instructed in the package.   Hydrocodone 5 mg--1 tablet every 6 hours as needed for pain.  This is best taken with food.  It can cause sleepiness or dizziness. I have sent a very small quantity of this medication into the pharmacy.  Please follow-up with your primary care and orthopedist about this issue.

## 2023-07-01 NOTE — ED Triage Notes (Signed)
 Pt c/o upper left leg pain for couple months that is intermittent. Reports affecting her gait. Uses pain pills that had previously that didn't help. Rubbed down with cream that helped temporarily.

## 2023-07-05 ENCOUNTER — Ambulatory Visit: Admitting: Family Medicine

## 2023-07-05 ENCOUNTER — Encounter: Payer: Self-pay | Admitting: Family Medicine

## 2023-07-05 VITALS — BP 130/80 | HR 79 | Temp 96.3°F | Resp 16 | Ht 63.0 in | Wt 200.0 lb

## 2023-07-05 DIAGNOSIS — G8929 Other chronic pain: Secondary | ICD-10-CM | POA: Diagnosis not present

## 2023-07-05 DIAGNOSIS — M25552 Pain in left hip: Secondary | ICD-10-CM | POA: Diagnosis not present

## 2023-07-05 DIAGNOSIS — M48062 Spinal stenosis, lumbar region with neurogenic claudication: Secondary | ICD-10-CM | POA: Diagnosis not present

## 2023-07-05 MED ORDER — OXYCODONE-ACETAMINOPHEN 5-325 MG PO TABS: 1.0000 | ORAL_TABLET | Freq: Two times a day (BID) | ORAL | 0 refills | Status: AC | PRN

## 2023-07-05 NOTE — Assessment & Plan Note (Signed)
 In general pain is stable but not well controlled. Ortho referral pace. Fall precautions discussed. Continue Duloxetine 60 mg daily.
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
## 2023-07-05 NOTE — Patient Instructions (Addendum)
 A few things to remember from today's visit:  Chronic left hip pain - Plan: oxyCODONE-acetaminophen (PERCOCET/ROXICET) 5-325 MG tablet, AMB referral to orthopedics  Lumbar stenosis with neurogenic claudication  Try Percocet at bedtime. Fall precautions. Will arrange appt with ortho. Complete Methylprednisolone.  If you need refills for medications you take chronically, please call your pharmacy. Do not use My Chart to request refills or for acute issues that need immediate attention. If you send a my chart message, it may take a few days to be addressed, specially if I am not in the office.  Please be sure medication list is accurate. If a new problem present, please set up appointment sooner than planned today.

## 2023-07-12 ENCOUNTER — Ambulatory Visit: Payer: Self-pay

## 2023-07-12 NOTE — Telephone Encounter (Signed)
 Copied from CRM [id number]. Topic: Clinical - Red Word Triage >> Jul 12, 2023 11:43 AM Louie Casa B wrote: [facility] that prompted transfer to Nurse Triage: patient has pain on right side Reason for Disposition . SEVERE chest pain  Answer Assessment - Initial Assessment Questions 1. LOCATION: "Where does it hurt?"       Right ribcage under right breast 2. RADIATION: "Does the pain go anywhere else?" (e.g., into neck, jaw, arms, back)     no 3. ONSET: "When did the chest pain begin?" (Minutes, hours or days)      today 4. PATTERN: "Does the pain come and go, or has it been constant since it started?"  "Does it get worse with exertion?"      Comes and goes 5. DURATION: "How long does it last" (e.g., seconds, minutes, hours)     unknown 6. SEVERITY: "How bad is the pain?"  (e.g., Scale 1-10; mild, moderate, or severe)    - MILD (1-3): doesn't interfere with normal activities     - MODERATE (4-7): interferes with normal activities or awakens from sleep    - SEVERE (8-10): excruciating pain, unable to do any normal activities       9/10  Protocols used: Chest Pain-A-AH

## 2023-07-12 NOTE — Telephone Encounter (Signed)
  Chief Complaint: pain Symptoms: pain under right rib cage Frequency: x 1 day Pertinent Negatives: Patient denies sob Disposition: [x] ED /[] Urgent Care (no appt availability in office) / [] Appointment(In office/virtual)/ []  [facility]/ [] Home Care/ [] Refused Recommended Disposition /[] [facility]/ []  Follow-up with PCP Additional Notes: pt states that she has been having pain under her right rib cage and right breast area. Denies it radiating anywhere else. Denies sob. Also states that pain in her left hip and has been ongoing and medication not working. State pain 9/10.  Offered to call EMS, pt declined and states her daughter will take her to the ED.

## 2023-07-15 NOTE — Telephone Encounter (Signed)
 Attempted to call the patient, but the call "can not be completed. Please hang up and try again later"

## 2023-07-17 NOTE — Telephone Encounter (Signed)
 Attempted to call the patient, but the call "can not be completed. Please hang up and try again later"

## 2023-07-23 ENCOUNTER — Ambulatory Visit: Admitting: Family Medicine

## 2023-07-29 ENCOUNTER — Other Ambulatory Visit: Payer: Self-pay | Admitting: Family Medicine

## 2023-07-29 DIAGNOSIS — Z1231 Encounter for screening mammogram for malignant neoplasm of breast: Secondary | ICD-10-CM

## 2023-07-31 ENCOUNTER — Telehealth: Payer: Self-pay | Admitting: Family Medicine

## 2023-07-31 NOTE — Telephone Encounter (Signed)
 Copied from CRM [id number]. Topic: Referral - Request for Referral >> Jul 31, 2023  1:49 PM Alyse July wrote: Did the patient discuss referral with their provider in the last year? Yes (If No - schedule appointment) (If Yes - send message)  Appointment offered? No  Type of order/referral and detailed reason for visit: MRI  Preference of office, provider, location: anywhere in area   If referral order, have you been seen by this specialty before? No (If Yes, this issue or another issue? When? Where?  Can we respond through MyChart? No

## 2023-07-31 NOTE — Telephone Encounter (Signed)
 I tried to contact patient, need to know what she wants the MRI for; PCP referred her to ortho.

## 2023-08-05 NOTE — Telephone Encounter (Signed)
 Called patient unable to leave VM

## 2023-08-06 NOTE — Telephone Encounter (Signed)
I tried contacting patient, unable to leave a message.

## 2023-08-22 DIAGNOSIS — E119 Type 2 diabetes mellitus without complications: Secondary | ICD-10-CM | POA: Diagnosis not present

## 2023-09-09 DIAGNOSIS — I10 Essential (primary) hypertension: Secondary | ICD-10-CM | POA: Diagnosis not present

## 2023-09-09 DIAGNOSIS — I2510 Atherosclerotic heart disease of native coronary artery without angina pectoris: Secondary | ICD-10-CM | POA: Diagnosis not present

## 2023-09-09 DIAGNOSIS — N182 Chronic kidney disease, stage 2 (mild): Secondary | ICD-10-CM | POA: Diagnosis not present

## 2023-09-09 DIAGNOSIS — I2699 Other pulmonary embolism without acute cor pulmonale: Secondary | ICD-10-CM | POA: Diagnosis not present

## 2023-09-24 DIAGNOSIS — E785 Hyperlipidemia, unspecified: Secondary | ICD-10-CM | POA: Diagnosis not present

## 2023-09-24 DIAGNOSIS — I10 Essential (primary) hypertension: Secondary | ICD-10-CM | POA: Diagnosis not present

## 2023-09-24 DIAGNOSIS — I2510 Atherosclerotic heart disease of native coronary artery without angina pectoris: Secondary | ICD-10-CM | POA: Diagnosis not present

## 2023-10-25 ENCOUNTER — Other Ambulatory Visit: Payer: Self-pay | Admitting: Family Medicine

## 2023-10-25 MED ORDER — NITROGLYCERIN 0.4 MG SL SUBL: SUBLINGUAL_TABLET | SUBLINGUAL | 0 refills | Status: DC

## 2023-10-25 NOTE — Telephone Encounter (Signed)
 Copied from CRM #[id number]. Topic: Clinical - Medication Refill >> Oct 25, 2023  9:35 AM Rosina BIRCH wrote: Medication: nitroGLYCERIN  (NITROSTAT ) 0.4 MG SL tablet (Patient was advised on the three business days. Patient asked if the doctor can send it in today.)  Has the patient contacted their pharmacy? Yes (Agent: If no, request that the patient contact the pharmacy for the refill. If patient does not wish to contact the pharmacy document the reason why and proceed with request.) (Agent: If yes, when and what did the pharmacy advise?)  This is the patient's preferred pharmacy:  [facility] [phone] - [place], [place] - [address] AT [facility] [address] [place] [address] Phone: [phone] Fax: [phone]  Is this the correct pharmacy for this prescription? Yes If no, delete pharmacy and type the correct one.   Has the prescription been filled recently? No  Is the patient out of the medication? Yes  Has the patient been seen for an appointment in the last year OR does the patient have an upcoming appointment? Yes  Can we respond through MyChart? No  Agent: Please be advised that Rx refills may take up to 3 business days. We ask that you follow-up with your pharmacy.

## 2023-11-19 ENCOUNTER — Encounter: Payer: Self-pay | Admitting: Family Medicine

## 2023-11-19 ENCOUNTER — Ambulatory Visit (INDEPENDENT_AMBULATORY_CARE_PROVIDER_SITE_OTHER)
Admission: RE | Admit: 2023-11-19 | Discharge: 2023-11-19 | Disposition: A | Discharge status: Home / Self Care | Source: Ambulatory Visit | Attending: Family Medicine | Admitting: Family Medicine

## 2023-11-19 ENCOUNTER — Ambulatory Visit (INDEPENDENT_AMBULATORY_CARE_PROVIDER_SITE_OTHER): Admitting: Family Medicine

## 2023-11-19 VITALS — BP 110/70 | HR 91 | Resp 16 | Ht 63.0 in | Wt 201.0 lb

## 2023-11-19 DIAGNOSIS — R053 Chronic cough: Secondary | ICD-10-CM

## 2023-11-19 DIAGNOSIS — E1149 Type 2 diabetes mellitus with other diabetic neurological complication: Secondary | ICD-10-CM

## 2023-11-19 DIAGNOSIS — R200 Anesthesia of skin: Secondary | ICD-10-CM | POA: Diagnosis not present

## 2023-11-19 DIAGNOSIS — J45991 Cough variant asthma: Secondary | ICD-10-CM

## 2023-11-19 DIAGNOSIS — R202 Paresthesia of skin: Secondary | ICD-10-CM

## 2023-11-19 DIAGNOSIS — M797 Fibromyalgia: Secondary | ICD-10-CM | POA: Diagnosis not present

## 2023-11-19 DIAGNOSIS — R252 Cramp and spasm: Secondary | ICD-10-CM

## 2023-11-19 DIAGNOSIS — M5441 Lumbago with sciatica, right side: Secondary | ICD-10-CM

## 2023-11-19 DIAGNOSIS — G8929 Other chronic pain: Secondary | ICD-10-CM

## 2023-11-19 DIAGNOSIS — R0989 Other specified symptoms and signs involving the circulatory and respiratory systems: Secondary | ICD-10-CM

## 2023-11-19 DIAGNOSIS — R059 Cough, unspecified: Secondary | ICD-10-CM | POA: Diagnosis not present

## 2023-11-19 LAB — BASIC METABOLIC PANEL WITH GFR
BUN: 24 mg/dL — ABNORMAL HIGH (ref 6–23)
CO2: 30 meq/L (ref 19–32)
Calcium: 9 mg/dL (ref 8.4–10.5)
Chloride: 105 meq/L (ref 96–112)
Creatinine, Ser: 1.68 mg/dL — ABNORMAL HIGH (ref 0.40–1.20)
GFR: 28.21 mL/min — ABNORMAL LOW (ref >60.00)
Glucose, Bld: 74 mg/dL (ref 70–99)
Potassium: 4.7 meq/L (ref 3.5–5.1)
Sodium: 142 meq/L (ref 135–145)

## 2023-11-19 LAB — TSH: TSH: 2.84 u[IU]/mL (ref 0.35–5.50)

## 2023-11-19 LAB — CBC
HCT: 41.3 % (ref 36.0–46.0)
Hemoglobin: 13.3 g/dL (ref 12.0–15.0)
MCHC: 32.2 g/dL (ref 30.0–36.0)
MCV: 90.3 fl (ref 78.0–100.0)
Platelets: 205 K/uL (ref 150.0–400.0)
RBC: 4.58 Mil/uL (ref 3.87–5.11)
RDW: 14.3 % (ref 11.5–15.5)
WBC: 6.4 K/uL (ref 4.0–10.5)

## 2023-11-19 LAB — HEMOGLOBIN A1C: Hgb A1c MFr Bld: 6.3 % (ref 4.6–6.5)

## 2023-11-19 LAB — VITAMIN B12: Vitamin B-12: 758 pg/mL (ref 211–911)

## 2023-11-19 MED ORDER — BUDESONIDE-FORMOTEROL FUMARATE 160-4.5 MCG/ACT IN AERO: 2.0000 | INHALATION_SPRAY | Freq: Two times a day (BID) | RESPIRATORY_TRACT | 3 refills | Status: DC

## 2023-11-19 NOTE — Patient Instructions (Addendum)
 A few things to remember from today's visit:  Type 2 diabetes mellitus with neurological complications (HCC) - Plan: Hemoglobin A1c  Chronic bilateral low back pain with right-sided sciatica  Fibromyalgia  Cough variant asthma - Plan: budesonide -formoterol  (SYMBICORT ) 160-4.5 MCG/ACT inhaler  Decreased dorsalis pedis pulse  Cough, persistent - Plan: DG Chest 2 View  Bilateral leg cramps - Plan: VAS US  ABI WITH/WO TBI  Numbness and tingling - Plan: Basic metabolic panel with GFR, TSH, Vitamin B12  Start Symbicort  2 times daily, rinse after use. Chest X ray to be done at [facility]. If cough is persistent we may need pulmonology referral and/or a chest CT.  If you need refills for medications you take chronically, please call your pharmacy. Do not use My Chart to request refills or for acute issues that need immediate attention. If you send a my chart message, it may take a few days to be addressed, specially if I am not in the office.  Please be sure medication list is accurate. If a new problem present, please set up appointment sooner than planned today.
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
## 2023-11-21 ENCOUNTER — Ambulatory Visit: Payer: Self-pay | Admitting: Family Medicine

## 2023-11-21 NOTE — Assessment & Plan Note (Signed)
 Problem has been well controlled with non pharmacologic treatment. Last HgA1C 5.7. Annual eye exam and foot care to continue. F/U in 5-6 months.

## 2023-11-21 NOTE — Assessment & Plan Note (Signed)
 Otherwise stable. No recent trauma. Currently on Duloxetine  60 mg daily. I do not think imaging is needed at this time. Tylenol  500 mg 3-4 times per day as needed.

## 2023-11-21 NOTE — Assessment & Plan Note (Signed)
 This problem could explain some of symptoms reported today. Continue Duloxetine  60 mg daily. Fall precautions discussed. Continue regular physical activity as tolerated and good sleep hygiene.

## 2023-11-21 NOTE — Assessment & Plan Note (Signed)
 Spirometry in 09/2011: no obstruction  This problem could be contributing to her cough. She has albuterol  inh at home, has not used it lately. She agrees with trying Symbicort  160-4.5 mcg bid. Some side effects discussed, rinse after use. F/U in 2 months.

## 2023-11-27 ENCOUNTER — Encounter (HOSPITAL_COMMUNITY): Payer: Self-pay

## 2023-11-27 ENCOUNTER — Other Ambulatory Visit: Payer: Self-pay | Admitting: Family Medicine

## 2023-11-29 ENCOUNTER — Other Ambulatory Visit: Payer: Self-pay | Admitting: Family Medicine

## 2023-11-29 DIAGNOSIS — M48062 Spinal stenosis, lumbar region with neurogenic claudication: Secondary | ICD-10-CM

## 2023-11-29 DIAGNOSIS — M797 Fibromyalgia: Secondary | ICD-10-CM

## 2023-12-05 ENCOUNTER — Encounter (HOSPITAL_COMMUNITY): Payer: Self-pay | Admitting: Emergency Medicine

## 2023-12-05 ENCOUNTER — Ambulatory Visit (INDEPENDENT_AMBULATORY_CARE_PROVIDER_SITE_OTHER)

## 2023-12-05 ENCOUNTER — Ambulatory Visit (HOSPITAL_COMMUNITY)
Admission: EM | Admit: 2023-12-05 | Discharge: 2023-12-05 | Disposition: A | Discharge status: Home / Self Care | Attending: Internal Medicine | Admitting: Internal Medicine

## 2023-12-05 DIAGNOSIS — R051 Acute cough: Secondary | ICD-10-CM

## 2023-12-05 DIAGNOSIS — M791 Myalgia, unspecified site: Secondary | ICD-10-CM | POA: Diagnosis not present

## 2023-12-05 DIAGNOSIS — B349 Viral infection, unspecified: Secondary | ICD-10-CM

## 2023-12-05 DIAGNOSIS — R0989 Other specified symptoms and signs involving the circulatory and respiratory systems: Secondary | ICD-10-CM | POA: Diagnosis not present

## 2023-12-05 DIAGNOSIS — R5383 Other fatigue: Secondary | ICD-10-CM | POA: Diagnosis not present

## 2023-12-05 DIAGNOSIS — R059 Cough, unspecified: Secondary | ICD-10-CM | POA: Diagnosis not present

## 2023-12-05 LAB — POC SARS CORONAVIRUS 2 AG -  ED: SARS Coronavirus 2 Ag: NEGATIVE

## 2023-12-05 MED ORDER — LORATADINE 10 MG PO TABS: 10.0000 mg | ORAL_TABLET | ORAL | 0 refills | Status: AC

## 2023-12-05 MED ORDER — BENZONATATE 100 MG PO CAPS: 100.0000 mg | ORAL_CAPSULE | Freq: Two times a day (BID) | ORAL | 0 refills | Status: DC | PRN

## 2023-12-05 MED ORDER — ACETAMINOPHEN 325 MG PO TABS
650.0000 mg | ORAL_TABLET | Freq: Once | ORAL | Status: AC
  Administered 2023-12-05: 650 mg via ORAL

## 2023-12-05 MED ORDER — ACETAMINOPHEN 325 MG PO TABS
ORAL_TABLET | ORAL | Status: AC
  Filled 2023-12-05: qty 2

## 2023-12-05 MED ORDER — ACETAMINOPHEN 325 MG PO TABS: 975.0000 mg | ORAL_TABLET | Freq: Once | ORAL | Status: DC

## 2023-12-05 MED ORDER — GUAIFENESIN ER 600 MG PO TB12: 600.0000 mg | ORAL_TABLET | Freq: Two times a day (BID) | ORAL | 0 refills | Status: DC | PRN
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
## 2023-12-05 NOTE — Discharge Instructions (Addendum)
 Chest x-ray looks great, no signs of pneumonia.   You have a viral upper respiratory infection.  Take Claritin  10 mg every other day in the morning to dry up nasal congestion.   Take mucinex  every 24 hours as needed for congestion.  Take tessalon  perles every 12 hours as needed for cough.   Follow-up with PCP.  If you develop any new or worsening symptoms or if your symptoms do not start to improve, please return here or follow-up with your primary care provider. If your symptoms are severe, please go to the emergency room.

## 2023-12-05 NOTE — ED Notes (Signed)
 Patient in xray

## 2023-12-05 NOTE — ED Triage Notes (Signed)
 Pt reports since beginning of week having fatigue and aching all over, congestion. Pt reports that bowels stared kinda running yesterday. Hasn't taken any medications for her symptoms.

## 2023-12-09 DIAGNOSIS — E782 Mixed hyperlipidemia: Secondary | ICD-10-CM | POA: Diagnosis not present

## 2023-12-09 DIAGNOSIS — I10 Essential (primary) hypertension: Secondary | ICD-10-CM | POA: Diagnosis not present

## 2023-12-09 DIAGNOSIS — I2510 Atherosclerotic heart disease of native coronary artery without angina pectoris: Secondary | ICD-10-CM | POA: Diagnosis not present

## 2023-12-09 DIAGNOSIS — I2699 Other pulmonary embolism without acute cor pulmonale: Secondary | ICD-10-CM | POA: Diagnosis not present

## 2023-12-17 ENCOUNTER — Ambulatory Visit (HOSPITAL_COMMUNITY)
Admission: RE | Admit: 2023-12-17 | Discharge: 2023-12-17 | Disposition: A | Discharge status: Home / Self Care | Source: Ambulatory Visit | Attending: Family Medicine | Admitting: Family Medicine

## 2023-12-17 DIAGNOSIS — R0989 Other specified symptoms and signs involving the circulatory and respiratory systems: Secondary | ICD-10-CM | POA: Insufficient documentation

## 2023-12-17 DIAGNOSIS — R252 Cramp and spasm: Secondary | ICD-10-CM | POA: Diagnosis not present

## 2023-12-18 LAB — VAS US ABI WITH/WO TBI
Left ABI: 1.04
Right ABI: 1.02
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
## 2023-12-25 ENCOUNTER — Ambulatory Visit: Payer: 59 | Admitting: Neurology

## 2023-12-26 ENCOUNTER — Other Ambulatory Visit: Payer: Self-pay | Admitting: Family Medicine

## 2023-12-30 ENCOUNTER — Other Ambulatory Visit: Payer: Self-pay | Admitting: Neurology

## 2023-12-31 DIAGNOSIS — R609 Edema, unspecified: Secondary | ICD-10-CM | POA: Diagnosis not present

## 2023-12-31 DIAGNOSIS — N183 Chronic kidney disease, stage 3 unspecified: Secondary | ICD-10-CM | POA: Diagnosis not present

## 2023-12-31 DIAGNOSIS — E1122 Type 2 diabetes mellitus with diabetic chronic kidney disease: Secondary | ICD-10-CM | POA: Diagnosis not present

## 2023-12-31 DIAGNOSIS — I129 Hypertensive chronic kidney disease with stage 1 through stage 4 chronic kidney disease, or unspecified chronic kidney disease: Secondary | ICD-10-CM | POA: Diagnosis not present

## 2023-12-31 DIAGNOSIS — I5030 Unspecified diastolic (congestive) heart failure: Secondary | ICD-10-CM | POA: Diagnosis not present

## 2024-01-13 ENCOUNTER — Emergency Department (HOSPITAL_COMMUNITY)

## 2024-01-13 ENCOUNTER — Encounter (HOSPITAL_COMMUNITY): Payer: Self-pay

## 2024-01-13 ENCOUNTER — Other Ambulatory Visit: Payer: Self-pay

## 2024-01-13 ENCOUNTER — Ambulatory Visit: Payer: Self-pay

## 2024-01-13 ENCOUNTER — Emergency Department (HOSPITAL_COMMUNITY)
Admission: EM | Admit: 2024-01-13 | Discharge: 2024-01-14 | Discharge status: Against Medical Advice | Attending: Emergency Medicine | Admitting: Emergency Medicine

## 2024-01-13 DIAGNOSIS — M791 Myalgia, unspecified site: Secondary | ICD-10-CM | POA: Insufficient documentation

## 2024-01-13 DIAGNOSIS — Z5321 Procedure and treatment not carried out due to patient leaving prior to being seen by health care provider: Secondary | ICD-10-CM | POA: Insufficient documentation

## 2024-01-13 DIAGNOSIS — R079 Chest pain, unspecified: Secondary | ICD-10-CM | POA: Diagnosis not present

## 2024-01-13 DIAGNOSIS — R5383 Other fatigue: Secondary | ICD-10-CM | POA: Insufficient documentation

## 2024-01-13 DIAGNOSIS — R531 Weakness: Secondary | ICD-10-CM | POA: Diagnosis not present

## 2024-01-13 LAB — I-STAT CHEM 8, ED
BUN: 24 mg/dL — ABNORMAL HIGH (ref 8–23)
Calcium, Ion: 1.18 mmol/L (ref 1.15–1.40)
Chloride: 103 mmol/L (ref 98–111)
Creatinine, Ser: 1.6 mg/dL — ABNORMAL HIGH (ref 0.44–1.00)
Glucose, Bld: 103 mg/dL — ABNORMAL HIGH (ref 70–99)
HCT: 45 % (ref 36.0–46.0)
Hemoglobin: 15.3 g/dL — ABNORMAL HIGH (ref 12.0–15.0)
Potassium: 4.5 mmol/L (ref 3.5–5.1)
Sodium: 138 mmol/L (ref 135–145)
TCO2: 28 mmol/L (ref 22–32)

## 2024-01-13 LAB — CBC WITH DIFFERENTIAL/PLATELET
Abs Immature Granulocytes: 0.03 K/uL (ref 0.00–0.07)
Basophils Absolute: 0 K/uL (ref 0.0–0.1)
Basophils Relative: 1 %
Eosinophils Absolute: 0.1 K/uL (ref 0.0–0.5)
Eosinophils Relative: 2 %
HCT: 44.5 % (ref 36.0–46.0)
Hemoglobin: 13.9 g/dL (ref 12.0–15.0)
Immature Granulocytes: 1 %
Lymphocytes Relative: 34 %
Lymphs Abs: 2.2 K/uL (ref 0.7–4.0)
MCH: 28.6 pg (ref 26.0–34.0)
MCHC: 31.2 g/dL (ref 30.0–36.0)
MCV: 91.6 fL (ref 80.0–100.0)
Monocytes Absolute: 0.6 K/uL (ref 0.1–1.0)
Monocytes Relative: 9 %
Neutro Abs: 3.6 K/uL (ref 1.7–7.7)
Neutrophils Relative %: 53 %
Platelets: 182 K/uL (ref 150–400)
RBC: 4.86 MIL/uL (ref 3.87–5.11)
RDW: 14.1 % (ref 11.5–15.5)
WBC: 6.5 K/uL (ref 4.0–10.5)
nRBC: 0 % (ref 0.0–0.2)

## 2024-01-13 LAB — COMPREHENSIVE METABOLIC PANEL WITH GFR
ALT: 20 U/L (ref 0–44)
AST: 22 U/L (ref 15–41)
Albumin: 3.1 g/dL — ABNORMAL LOW (ref 3.5–5.0)
Alkaline Phosphatase: 116 U/L (ref 38–126)
Anion gap: 10 (ref 5–15)
BUN: 21 mg/dL (ref 8–23)
CO2: 24 mmol/L (ref 22–32)
Calcium: 9 mg/dL (ref 8.9–10.3)
Chloride: 99 mmol/L (ref 98–111)
Creatinine, Ser: 1.47 mg/dL — ABNORMAL HIGH (ref 0.44–1.00)
GFR, Estimated: 35 mL/min — ABNORMAL LOW (ref >60)
Glucose, Bld: 103 mg/dL — ABNORMAL HIGH (ref 70–99)
Potassium: 4.4 mmol/L (ref 3.5–5.1)
Sodium: 133 mmol/L — ABNORMAL LOW (ref 135–145)
Total Bilirubin: 0.5 mg/dL (ref 0.0–1.2)
Total Protein: 6.9 g/dL (ref 6.5–8.1)

## 2024-01-13 LAB — RESP PANEL BY RT-PCR (RSV, FLU A&B, COVID)  RVPGX2
Influenza A by PCR: NEGATIVE
Influenza B by PCR: NEGATIVE
Resp Syncytial Virus by PCR: NEGATIVE
SARS Coronavirus 2 by RT PCR: NEGATIVE

## 2024-01-13 NOTE — ED Triage Notes (Signed)
 Pt presents for 8/10 pain all over and fatigue x 1 week.

## 2024-01-13 NOTE — ED Notes (Signed)
 Pt stated that she will see her PCP in the morning and that she would like to leave AMA.

## 2024-01-13 NOTE — ED Provider Triage Note (Signed)
 Emergency Medicine Provider Triage Evaluation Note  [name] , a 83 y.o. female  was evaluated in triage.  Pt complains of the legs for approximately 1 week.  No localized symptoms.  Review of Systems  Positive: Muscle aches, generalized weakness.  Negative: Fever, CP, SOB, Abdominal pain, cough, congestion   Physical Exam  BP 118/77 (BP Location: Right Arm)   Pulse 85   Temp 97.6 F (36.4 C)   Resp 18   Ht 5' 3 (1.6 m)   Wt 91 kg   SpO2 98%   BMI 35.54 kg/m  Gen:   Awake, no distress   Resp:  Normal effort  MSK:   Moves extremities without difficulty  Other:    Medical Decision Making  Medically screening exam initiated at 4:25 PM.  Appropriate orders placed.  [name] was informed that the remainder of the evaluation will be completed by another provider, this initial triage assessment does not replace that evaluation, and the importance of remaining in the ED until their evaluation is complete.     Shermon Warren SAILOR, PA-C 01/13/24 1625

## 2024-01-13 NOTE — Telephone Encounter (Signed)
 Attempted to reach patient. Unable to leave voicemail. Will attempt again.

## 2024-01-13 NOTE — Telephone Encounter (Signed)
 FYI Only or Action Required?: FYI only for provider.  Patient was last seen in primary care on 11/19/2023 by [place], Betty G, MD.  Called Nurse Triage reporting Generalized Body Aches.  Symptoms began a week ago.  Interventions attempted: Nothing.  Symptoms are: unchanged.  Triage Disposition: See PCP When Office is Open (Within 3 Days)  Patient/caregiver understands and will follow disposition?: Yes       Copied from CRM #[id number]. Topic: Clinical - Red Word Triage >> Jan 13, 2024 10:30 AM Rosina BIRCH wrote: Reason for RMF:anib aches/pain for a week Reason for Disposition  [1] MODERATE pain (e.g., interferes with normal activities) AND [2] present > 3 days  Answer Assessment - Initial Assessment Questions Pt states she has had body aches all over x 1 week. States she has tried the tramadol  but thinks she is allergic as it causes itching.   1. ONSET: When did the muscle aches or body pains start?      About a week now 2. LOCATION: What part of your body is hurting? (e.g., entire body, arms, legs)      Whole body 3. SEVERITY: How bad is the pain? (Scale 1-10; or mild, moderate, severe)     9/10 4. CAUSE: What do you think is causing the pains?      5. FEVER: Do you have a fever? If Yes, ask: What is your temperature, how was it measured, and  when did it start?      Unknown, sweating 6. OTHER SYMPTOMS: Do you have any other symptoms? (e.g., chest pain, cold or flu symptoms, rash, weakness, weight loss)     weakness  8. TRAVEL: Have you traveled out of the country in the last month? (e.g., exposures, travel history)     no  Protocols used: Muscle Aches and Body Pain-A-AH

## 2024-01-14 ENCOUNTER — Ambulatory Visit (INDEPENDENT_AMBULATORY_CARE_PROVIDER_SITE_OTHER): Admitting: Family Medicine

## 2024-01-14 ENCOUNTER — Encounter: Payer: Self-pay | Admitting: Family Medicine

## 2024-01-14 ENCOUNTER — Ambulatory Visit: Admitting: Family Medicine

## 2024-01-14 VITALS — BP 90/60 | HR 95 | Temp 97.9°F | Resp 16 | Ht 63.0 in | Wt 201.0 lb

## 2024-01-14 DIAGNOSIS — Z23 Encounter for immunization: Secondary | ICD-10-CM | POA: Diagnosis not present

## 2024-01-14 DIAGNOSIS — M797 Fibromyalgia: Secondary | ICD-10-CM

## 2024-01-14 DIAGNOSIS — K5900 Constipation, unspecified: Secondary | ICD-10-CM | POA: Diagnosis not present

## 2024-01-14 DIAGNOSIS — M159 Polyosteoarthritis, unspecified: Secondary | ICD-10-CM | POA: Diagnosis not present

## 2024-01-14 DIAGNOSIS — R569 Unspecified convulsions: Secondary | ICD-10-CM | POA: Diagnosis not present

## 2024-01-14 DIAGNOSIS — I10 Essential (primary) hypertension: Secondary | ICD-10-CM | POA: Diagnosis not present

## 2024-01-14 MED ORDER — SAVELLA TITRATION PACK 12.5 & 25 & 50 MG PO MISC: ORAL | 0 refills | Status: DC

## 2024-01-14 MED ORDER — DULOXETINE HCL 30 MG PO CPEP: 30.0000 mg | ORAL_CAPSULE | Freq: Every day | ORAL | 1 refills | Status: DC

## 2024-01-14 MED ORDER — LINACLOTIDE 145 MCG PO CAPS: 145.0000 ug | ORAL_CAPSULE | Freq: Every day | ORAL | 1 refills | Status: DC

## 2024-01-14 NOTE — Telephone Encounter (Signed)
 Patient has appt today at 1030 with PCP.

## 2024-01-14 NOTE — Patient Instructions (Addendum)
 A few things to remember from today's visit:  Essential hypertension  Generalized osteoarthritis of multiple sites - Plan: DULoxetine  (CYMBALTA ) 30 MG capsule  Constipation, unspecified constipation type - Plan: linaclotide  (LINZESS ) 145 MCG CAPS capsule  Fibromyalgia - Plan: Milnacipran HCl (SAVELLA TITRATION PACK) 12.5 & 25 & 50 MG MISC  Increase Linzess  from 72 mcg to 145 mcg, if you still have 72 mcg at home you can take 2 at the same time and the next prescription will be 145 mcg to take 1 daily. Stop Duloxetine  60 mg and start 30 mg daily for 2 weeks then every other day for 2 weeks and stop. Start Savella starting pack, low dose to titrate up as tolerated for fibromyalgia. Call your neurologist's offcie. I will see you back in 6 weeks.  If you need refills for medications you take chronically, please call your pharmacy. Do not use My Chart to request refills or for acute issues that need immediate attention. If you send a my chart message, it may take a few days to be addressed, specially if I am not in the office.  Please be sure medication list is accurate. If a new problem present, please set up appointment sooner than planned today.
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
## 2024-01-16 NOTE — Assessment & Plan Note (Signed)
 BP mildly low today. Last week her nephrologist adjusted her antihypertensive medications, continue Irbesartan  75 mg daily and Carvedilol 25 mg 1/2 tab bid. Continue monitoring BP at home, may need further adjustments.

## 2024-01-16 NOTE — Assessment & Plan Note (Signed)
 Duloxetine  60 mg daily is not helping, so she is going to wean it off. Tylenol  500 mg 3-4 times per day as needed.

## 2024-01-16 NOTE — Assessment & Plan Note (Addendum)
 Symptoms she is reporting today can be attributed to this problem. We reviewed dx,prognosis,and treatment options. She does not think Duloxetine  60 mg is helping, so she will wean it off as instructed on AVS. Savella side effects discussed, she agrees with trying, initiation package sent to her pharmacy.  LE's pain she is reported could also be related to her hx of lumbar radiculopathy. If continues worsening , we may need to arrange appt with ortho. F/U in 6 weeks.

## 2024-01-16 NOTE — Assessment & Plan Note (Signed)
 On Lamotrigine  200 mg daily. She is due for follow up, instructed to call her neurologist's office to arrange f/u appt.

## 2024-01-16 NOTE — Assessment & Plan Note (Signed)
 Problem is not well controlled with current Linzess  treatment, so increase dose from 72 mcg to 145 mcg. Side effects discussed. Adequate fiber and water  intake also encouraged.

## 2024-01-17 ENCOUNTER — Ambulatory Visit: Payer: Self-pay | Admitting: Family Medicine

## 2024-01-17 ENCOUNTER — Other Ambulatory Visit: Payer: Self-pay | Admitting: Family Medicine

## 2024-01-17 NOTE — Telephone Encounter (Signed)
 This RN made first attempt to contact pt with the message, Your call could not be completed at this time.    Copied from CRM #[id number]. Topic: Clinical - Medical Advice >> Jan 17, 2024 12:42 PM Mia F wrote: Reason for CRM: pt says she is stopping the Milnacipran HCl (SAVELLA TITRATION PACK) 12.5 & 25 & 50 MG MISC due to it causing her to have chest pains. She says she will like to go back to the medication she was taking before this one.

## 2024-01-17 NOTE — Telephone Encounter (Signed)
 This RN made third attempt to contact pt with the message, Your call could not be completed at this time.

## 2024-01-17 NOTE — Telephone Encounter (Signed)
 This RN made second attempt to contact pt with the message, Your call could not be completed at this time.

## 2024-01-20 ENCOUNTER — Telehealth: Payer: Self-pay | Admitting: *Deleted

## 2024-01-20 NOTE — Telephone Encounter (Signed)
 Attempted to reach patient regarding Dr. Gib response. Received Call cannot be completed at this time. Unable to leave voicemail.

## 2024-01-20 NOTE — Telephone Encounter (Signed)
 Copied from CRM #[id number]. Topic: Clinical - Medical Advice >> Jan 20, 2024 11:44 AM Mesmerise C wrote: Reason for CRM: Patient stated she's taking her medicine now and she's eating more now not feeling dizzy or weak anymore feels better now wanted to inform provider

## 2024-01-20 NOTE — Telephone Encounter (Signed)
 She did not feel like Duloxetine  was helping, so we can still wean it off as we discussed. Thanks, BJ

## 2024-01-21 NOTE — Progress Notes (Signed)
 [name]                                          MRN: [id number]   01/21/2024   The [facility] Quality Team Specialist reviewed this patient medical record for the purposes of chart review for care gap closure. The following were reviewed: chart review for care gap closure-kidney health evaluation for diabetes:eGFR  and uACR. Patient has appt 11/25- is it possible to have uACR completed at this time?    [facility] Quality Team

## 2024-01-22 NOTE — Telephone Encounter (Signed)
 Second attempt. Received message, Your call cannot be completed at this time, please try your call again later. Unable to leave voicemail.

## 2024-01-24 DIAGNOSIS — B351 Tinea unguium: Secondary | ICD-10-CM | POA: Diagnosis not present

## 2024-01-24 DIAGNOSIS — E1151 Type 2 diabetes mellitus with diabetic peripheral angiopathy without gangrene: Secondary | ICD-10-CM | POA: Diagnosis not present

## 2024-01-28 ENCOUNTER — Other Ambulatory Visit: Payer: Self-pay | Admitting: Neurology

## 2024-01-28 NOTE — Telephone Encounter (Signed)
 Patient No-Showed OV 12/25/23 and has not rescheduled. She does not use MyChart.   Please contact to schedule, thank you!

## 2024-01-30 ENCOUNTER — Emergency Department (HOSPITAL_COMMUNITY)

## 2024-01-30 ENCOUNTER — Other Ambulatory Visit: Payer: Self-pay

## 2024-01-30 ENCOUNTER — Ambulatory Visit: Payer: Self-pay

## 2024-01-30 ENCOUNTER — Emergency Department (HOSPITAL_COMMUNITY): Admission: EM | Admit: 2024-01-30 | Discharge: 2024-01-30 | Disposition: A | Discharge status: Home / Self Care

## 2024-01-30 DIAGNOSIS — Z79899 Other long term (current) drug therapy: Secondary | ICD-10-CM | POA: Diagnosis not present

## 2024-01-30 DIAGNOSIS — R42 Dizziness and giddiness: Secondary | ICD-10-CM | POA: Insufficient documentation

## 2024-01-30 DIAGNOSIS — W19XXXA Unspecified fall, initial encounter: Secondary | ICD-10-CM

## 2024-01-30 DIAGNOSIS — W1812XA Fall from or off toilet with subsequent striking against object, initial encounter: Secondary | ICD-10-CM | POA: Diagnosis not present

## 2024-01-30 DIAGNOSIS — Y92002 Bathroom of unspecified non-institutional (private) residence single-family (private) house as the place of occurrence of the external cause: Secondary | ICD-10-CM | POA: Insufficient documentation

## 2024-01-30 DIAGNOSIS — Z8673 Personal history of transient ischemic attack (TIA), and cerebral infarction without residual deficits: Secondary | ICD-10-CM | POA: Insufficient documentation

## 2024-01-30 DIAGNOSIS — W06XXXA Fall from bed, initial encounter: Secondary | ICD-10-CM | POA: Insufficient documentation

## 2024-01-30 DIAGNOSIS — S0003XA Contusion of scalp, initial encounter: Secondary | ICD-10-CM | POA: Diagnosis not present

## 2024-01-30 DIAGNOSIS — Z7901 Long term (current) use of anticoagulants: Secondary | ICD-10-CM | POA: Insufficient documentation

## 2024-01-30 DIAGNOSIS — S0990XA Unspecified injury of head, initial encounter: Secondary | ICD-10-CM | POA: Diagnosis present

## 2024-01-30 LAB — CBC
HCT: 40.6 % (ref 36.0–46.0)
Hemoglobin: 12.6 g/dL (ref 12.0–15.0)
MCH: 29.1 pg (ref 26.0–34.0)
MCHC: 31 g/dL (ref 30.0–36.0)
MCV: 93.8 fL (ref 80.0–100.0)
Platelets: 172 K/uL (ref 150–400)
RBC: 4.33 MIL/uL (ref 3.87–5.11)
RDW: 13.9 % (ref 11.5–15.5)
WBC: 6.3 K/uL (ref 4.0–10.5)
nRBC: 0 % (ref 0.0–0.2)

## 2024-01-30 LAB — COMPREHENSIVE METABOLIC PANEL WITH GFR
ALT: 18 U/L (ref 0–44)
AST: 21 U/L (ref 15–41)
Albumin: 2.8 g/dL — ABNORMAL LOW (ref 3.5–5.0)
Alkaline Phosphatase: 87 U/L (ref 38–126)
Anion gap: 8 (ref 5–15)
BUN: 20 mg/dL (ref 8–23)
CO2: 26 mmol/L (ref 22–32)
Calcium: 8.6 mg/dL — ABNORMAL LOW (ref 8.9–10.3)
Chloride: 104 mmol/L (ref 98–111)
Creatinine, Ser: 1.48 mg/dL — ABNORMAL HIGH (ref 0.44–1.00)
GFR, Estimated: 35 mL/min — ABNORMAL LOW (ref >60)
Glucose, Bld: 109 mg/dL — ABNORMAL HIGH (ref 70–99)
Potassium: 4.4 mmol/L (ref 3.5–5.1)
Sodium: 138 mmol/L (ref 135–145)
Total Bilirubin: 0.4 mg/dL (ref 0.0–1.2)
Total Protein: 6.3 g/dL — ABNORMAL LOW (ref 6.5–8.1)

## 2024-01-30 LAB — URINALYSIS, ROUTINE W REFLEX MICROSCOPIC
Bilirubin Urine: NEGATIVE
Glucose, UA: NEGATIVE mg/dL
Hgb urine dipstick: NEGATIVE
Ketones, ur: NEGATIVE mg/dL
Leukocytes,Ua: NEGATIVE
Nitrite: NEGATIVE
Protein, ur: NEGATIVE mg/dL
Specific Gravity, Urine: 1.013 (ref 1.005–1.030)
pH: 7 (ref 5.0–8.0)

## 2024-01-30 LAB — I-STAT CHEM 8, ED
BUN: 22 mg/dL (ref 8–23)
Calcium, Ion: 1.26 mmol/L (ref 1.15–1.40)
Chloride: 104 mmol/L (ref 98–111)
Creatinine, Ser: 1.6 mg/dL — ABNORMAL HIGH (ref 0.44–1.00)
Glucose, Bld: 104 mg/dL — ABNORMAL HIGH (ref 70–99)
HCT: 40 % (ref 36.0–46.0)
Hemoglobin: 13.6 g/dL (ref 12.0–15.0)
Potassium: 4.4 mmol/L (ref 3.5–5.1)
Sodium: 141 mmol/L (ref 135–145)
TCO2: 26 mmol/L (ref 22–32)

## 2024-01-30 LAB — PROTIME-INR
INR: 1.2 (ref 0.8–1.2)
Prothrombin Time: 16.4 s — ABNORMAL HIGH (ref 11.4–15.2)

## 2024-01-30 LAB — TROPONIN I (HIGH SENSITIVITY)
Troponin I (High Sensitivity): 5 ng/L (ref <18)
Troponin I (High Sensitivity): 5 ng/L (ref <18)

## 2024-01-30 LAB — CK: Total CK: 110 U/L (ref 38–234)

## 2024-01-30 LAB — I-STAT CG4 LACTIC ACID, ED: Lactic Acid, Venous: 1.2 mmol/L (ref 0.5–1.9)

## 2024-01-30 LAB — ETHANOL: Alcohol, Ethyl (B): <15 mg/dL (ref <15)

## 2024-01-30 MED ORDER — CALCIUM CARBONATE ANTACID 500 MG PO CHEW
1.0000 | CHEWABLE_TABLET | Freq: Once | ORAL | Status: AC
  Administered 2024-01-30: 200 mg via ORAL
  Filled 2024-01-30: qty 1

## 2024-01-30 NOTE — ED Notes (Signed)
 Pt assisted to bedside commode. Pt endorses feelings of dizziness upon standing.
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
## 2024-01-30 NOTE — Discharge Instructions (Signed)
 Your workup today was unremarkable for acute pathology.  Please go from a sitting to standing position very slowly.  If anything changes come back to the ED.

## 2024-01-30 NOTE — ED Provider Triage Note (Signed)
 Emergency Medicine Provider Triage Evaluation Note  [name] , a 83 y.o. female  was evaluated in triage.  Pt complains of fall prior to arrival. The patient reports that she had felt dizzy whenever she was standing up in the bed but was able to ambulate to the bathroom.  She reports that she sat down on the commode and was still feeling dizzy.  She reports that she reports falling forward hitting her head on the sink and lying on the ground however she was able to get herself up off the ground.  She reports does have a seizure disorder however did not have any seizures.  She denies any urinary fecal incontinence.  Denies any mouth or tongue pain.  Denies any chest pain or shortness of breath now.  She was that she still feels a little lightheaded however has greatly improved since previously.  She is on Eliquis .  Review of Systems  Positive:  Negative:   Physical Exam  BP 122/74   Pulse 82   Temp 98 F (36.7 C) (Oral)   Resp (!) 22   SpO2 93%  Gen:   Awake, no distress   Resp:  Normal effort  MSK:   Moves extremities without difficulty  Other:  Large hematoma to the right temporal region.  No facial droop.  Benign neurological examination.  Strength and sensation intact all 4 extremities.  No acute tenderness to the bilateral upper and lower extremities.  Answering questions appropriately appropriate speech.  No facial droop.  Cranial nerves II through XII intact.  Finger-nose-finger intact.  Medical Decision Making  Medically screening exam initiated at 2:45 PM.  Appropriate orders placed.  Othel Orion Lager was informed that the remainder of the evaluation will be completed by another provider, this initial triage assessment does not replace that evaluation, and the importance of remaining in the ED until their evaluation is complete.  I have ordered C collar and CT scans of the patient's neck and head. Neurological exam is benign. Labs ordered as well.     Bernis Ernst, [place] 01/30/24 [phone]

## 2024-01-30 NOTE — ED Notes (Signed)
 When pt checked in I took pt straight back to room and Level 2 was activated.

## 2024-01-30 NOTE — ED Notes (Signed)
 Pt was given discharge instructions and verbalized understanding. Pt sister is with her and will be taking her home with her for the night.

## 2024-01-30 NOTE — ED Provider Notes (Signed)
 [place] EMERGENCY DEPARTMENT AT [facility] Provider Note   CSN: [id number] Arrival date & time: 01/30/24  1427     Patient presents with: [name] Daron Breeding is a 83 y.o. female.    Fall Pertinent negatives include no chest pain, no abdominal pain and no shortness of breath.    Patient presents because of fall.  According to patient, she got up this morning feeling little dizzy.  She feels that way a lot in the morning.  Subsequently tried to get up off the commode when she subsequent fell forward and hit the left front of her head against the sink.  Did not lose consciousness.  Does take Eliquis .  Endorsing some left-sided paraspinal pain.  No loss of consciousness.  No numbness or tingling aware.  No diplopia or vision changes.  Otherwise, feeling at normal state of health.  No chest pain or shortness of breath.  No nausea vomit diarrhea.  Denies all other complaints.  Has been feeling fine here recently.  She states that she is usually vertiginous in the morning and nothing was new about today's presentation.   Previous medical history reviewed : Patient was last seen in the ED on December 05, 2023.  Was seen because of fatigue.  Unremarkable workup at that time.  Likely viral upper story infection     Prior to Admission medications   Medication Sig Start Date End Date Taking? Authorizing Provider  albuterol  (VENTOLIN  HFA) 108 (90 Base) MCG/ACT inhaler INHALE 2 PUFFS BY MOUTH EVERY 6 HOURS AS NEEDED FOR WHEEZING 02/25/23   Jordan, Betty G, MD  atorvastatin  (LIPITOR) 40 MG tablet TAKE 1 TABLET(40 MG) BY MOUTH DAILY AT 6 PM 01/20/24   Jordan, Betty G, MD  budesonide -formoterol  (SYMBICORT ) 160-4.5 MCG/ACT inhaler Inhale 2 puffs into the lungs 2 (two) times daily. 11/19/23   Jordan, Betty G, MD  carbamide peroxide (DEBROX) 6.5 % OTIC solution Place 5 drops into the left ear 2 (two) times daily. 05/28/22   Enedelia Dorna HERO, FNP  carvedilol (COREG) 25 MG tablet  Take 12.5 mg by mouth 2 (two) times daily. 07/22/19   [provider]  diclofenac  Sodium (VOLTAREN ) 1 % GEL Apply 2 g topically 4 (four) times daily. 08/09/22   Long, Fonda MATSU, MD  DULoxetine  (CYMBALTA ) 30 MG capsule Take 1 capsule (30 mg total) by mouth daily. 01/14/24   Jordan, Betty G, MD  ELIQUIS  5 MG TABS tablet TAKE 1 TABLET(5 MG) BY MOUTH TWICE DAILY 06/17/23   Jordan, Betty G, MD  famotidine  (PEPCID ) 20 MG tablet TAKE 1 TABLET(20 MG) BY MOUTH AT BEDTIME 10/06/21   Jordan, Betty G, MD  furosemide  (LASIX ) 40 MG tablet TAKE 1 TABLET(40 MG) BY MOUTH DAILY Patient taking differently: Take 40 mg by mouth as needed for fluid. 05/01/21   Jordan, Betty G, MD  irbesartan  (AVAPRO ) 75 MG tablet Take 1 tablet (75 mg total) by mouth daily. 01/09/22   Jordan, Betty G, MD  LamoTRIgine  200 MG TB24 24 hour tablet TAKE 1 TABLET(200 MG) BY MOUTH AT BEDTIME 01/28/24   Gayland Lauraine PARAS, NP  linaclotide  (LINZESS ) 145 MCG CAPS capsule Take 1 capsule (145 mcg total) by mouth daily before breakfast. 01/14/24   Jordan, Betty G, MD  loratadine  (CLARITIN ) 10 MG tablet Take 1 tablet (10 mg total) by mouth every other day. 12/05/23   Enedelia Dorna HERO, FNP  memantine  (NAMENDA ) 10 MG tablet Take 1 tablet (10 mg total) by mouth 2 (two)  times daily. 12/25/22   Gayland Lauraine PARAS, NP  Milnacipran HCl (SAVELLA TITRATION PACK) 12.5 & 25 & 50 MG MISC 1 starting pack. Duloxetine  is going to be weaned off. 01/14/24   Jordan, Betty G, MD  montelukast  (SINGULAIR ) 10 MG tablet TAKE 1 TABLET(10 MG) BY MOUTH EVERY DAY IN THE EVENING 12/26/23   Jordan, Betty G, MD  nitroGLYCERIN  (NITROSTAT ) 0.4 MG SL tablet PLACE 1 TABLET UNDER THE TONGUE EVERY 5 MINUTES AS NEEDED FOR CHEST PAIN 10/25/23   Jordan, Betty G, MD  pantoprazole  (PROTONIX ) 40 MG tablet TAKE 1 TABLET(40 MG) BY MOUTH DAILY 11/27/23   Jordan, Betty G, MD  senna-docusate (SENOKOT-S) 8.6-50 MG tablet Take 1 tablet by mouth at bedtime as needed for mild constipation or moderate  constipation. 08/09/22   Long, Joshua G, MD    Allergies: Bee pollen and Pollen extract    Review of Systems  Constitutional:  Negative for chills and fever.  HENT:  Negative for ear pain and sore throat.   Eyes:  Negative for pain and visual disturbance.  Respiratory:  Negative for cough and shortness of breath.   Cardiovascular:  Negative for chest pain and palpitations.  Gastrointestinal:  Negative for abdominal pain and vomiting.  Genitourinary:  Negative for dysuria and hematuria.  Musculoskeletal:  Negative for arthralgias and back pain.  Skin:  Negative for color change and rash.  Neurological:  Negative for seizures and syncope.  All other systems reviewed and are negative.   Updated Vital Signs BP 128/80   Pulse 77   Temp 98 F (36.7 C) (Oral)   Resp 17   Ht 5' 3 (1.6 m)   Wt 90.7 kg   SpO2 100%   BMI 35.43 kg/m   Physical Exam Vitals and nursing note reviewed.  Constitutional:      General: She is not in acute distress.    Appearance: She is well-developed.  HENT:     Head: Normocephalic and atraumatic.  Eyes:     Conjunctiva/sclera: Conjunctivae normal.  Cardiovascular:     Rate and Rhythm: Normal rate and regular rhythm.     Heart sounds: No murmur heard. Pulmonary:     Effort: Pulmonary effort is normal. No respiratory distress.     Breath sounds: Normal breath sounds.  Abdominal:     Palpations: Abdomen is soft.     Tenderness: There is no abdominal tenderness.  Musculoskeletal:        General: No swelling.     Cervical back: Neck supple.  Skin:    General: Skin is warm and dry.     Capillary Refill: Capillary refill takes less than 2 seconds.  Neurological:     Mental Status: She is alert.  Psychiatric:        Mood and Affect: Mood normal.     (all labs ordered are listed, but only abnormal results are displayed) Labs Reviewed  COMPREHENSIVE METABOLIC PANEL WITH GFR - Abnormal; Notable for the following components:      Result Value    Glucose, Bld 109 (*)    Creatinine, Ser 1.48 (*)    Calcium  8.6 (*)    Total Protein 6.3 (*)    Albumin  2.8 (*)    GFR, Estimated 35 (*)    All other components within normal limits  URINALYSIS, ROUTINE W REFLEX MICROSCOPIC - Abnormal; Notable for the following components:   APPearance HAZY (*)    All other components within normal limits  PROTIME-INR - Abnormal; Notable for  the following components:   Prothrombin Time 16.4 (*)    All other components within normal limits  I-STAT CHEM 8, ED - Abnormal; Notable for the following components:   Creatinine, Ser 1.60 (*)    Glucose, Bld 104 (*)    All other components within normal limits  CBC  ETHANOL  CK  I-STAT CG4 LACTIC ACID, ED  TROPONIN I (HIGH SENSITIVITY)  TROPONIN I (HIGH SENSITIVITY)    EKG: EKG Interpretation Date/Time:  Thursday January 30 2024 14:40:25 EDT Ventricular Rate:  87 PR Interval:  219 QRS Duration:  83 QT Interval:  356 QTC Calculation: 429 R Axis:   14  Text Interpretation: Sinus rhythm Borderline prolonged PR interval Low voltage, precordial leads Confirmed by Simon Rea [phone]) on 01/30/2024 6:50:24 PM  Radiology: CT Cervical Spine Wo Contrast Result Date: 01/30/2024 CLINICAL DATA:  Provided history: Neck trauma (Age >= 65y) Dizziness leading to fall EXAM: CT CERVICAL SPINE WITHOUT CONTRAST TECHNIQUE: Multidetector CT imaging of the cervical spine was performed without intravenous contrast. Multiplanar CT image reconstructions were also generated. RADIATION DOSE REDUCTION: This exam was performed according to the departmental dose-optimization program which includes automated exposure control, adjustment of the mA and/or kV according to patient size and/or use of iterative reconstruction technique. COMPARISON:  None Available. FINDINGS: Alignment: Straightening of normal lordosis. Mild broad-based dextroscoliotic curvature. No traumatic subluxation. Skull base and vertebrae: No acute fracture.  Vertebral body heights are maintained. The dens and skull base are intact. Soft tissues and spinal canal: No prevertebral fluid or swelling. No visible canal hematoma. Disc levels: Multilevel degenerative disc disease most prominently affecting C4-C5. Upper chest: No acute findings. Other: None. IMPRESSION: 1. No acute fracture or subluxation of the cervical spine. 2. Multilevel degenerative disc disease. Electronically Signed   By: Andrea Gasman M.D.   On: 01/30/2024 15:41   CT Head Wo Contrast Result Date: 01/30/2024 CLINICAL DATA:  Provided history: Head trauma, moderate-severe Dizziness leading to fall. EXAM: CT HEAD WITHOUT CONTRAST TECHNIQUE: Contiguous axial images were obtained from the base of the skull through the vertex without intravenous contrast. RADIATION DOSE REDUCTION: This exam was performed according to the departmental dose-optimization program which includes automated exposure control, adjustment of the mA and/or kV according to patient size and/or use of iterative reconstruction technique. COMPARISON:  Head CT 10/28/2016, brain MRI 12/07/2022 FINDINGS: Brain: No intracranial hemorrhage, mass effect, or midline shift. Age related atrophy. No hydrocephalus. The basilar cisterns are patent. Moderate periventricular chronic small vessel ischemia. Remote lacunar infarct in the left basal ganglia and caudate. No evidence of territorial infarct or acute ischemia. No extra-axial or intracranial fluid collection. Vascular: Atherosclerosis of skullbase vasculature without hyperdense vessel or abnormal calcification. Skull: No fracture or focal lesion. Sinuses/Orbits: Paranasal sinuses and mastoid air cells are clear. The visualized orbits are unremarkable. Other: Right frontal scalp hematoma. IMPRESSION: 1. Right frontal scalp hematoma. No acute intracranial abnormality. No skull fracture. 2. Age related atrophy and chronic small vessel ischemia. Remote lacunar infarct in the left basal ganglia  and caudate. Electronically Signed   By: Andrea Gasman M.D.   On: 01/30/2024 15:35   DG Chest Port 1 View Result Date: 01/30/2024 CLINICAL DATA:  Trauma. EXAM: PORTABLE CHEST 1 VIEW COMPARISON:  Chest radiograph dated 01/13/2024. FINDINGS: No focal consolidation, pleural effusion or pneumothorax. The cardiac silhouette is within normal limits. No acute osseous pathology. Degenerative changes of the spine. IMPRESSION: No active disease. Electronically Signed   By: Vanetta Chou M.D.   On:  01/30/2024 15:05   DG Pelvis Portable Result Date: 01/30/2024 CLINICAL DATA:  Trauma. EXAM: PORTABLE PELVIS 1-2 VIEWS COMPARISON:  Radiograph dated 06/29/2020. FINDINGS: No acute fracture or dislocation. The bones are osteopenic. Bilateral hip arthritic changes. The soft tissues are unremarkable. IMPRESSION: 1. No acute fracture or dislocation. 2. Bilateral hip arthritic changes. Electronically Signed   By: Vanetta Chou M.D.   On: 01/30/2024 15:04     Procedures   Medications Ordered in the ED  calcium  carbonate (TUMS - dosed in mg elemental calcium ) chewable tablet 200 mg of elemental calcium  (has no administration in time range)                                    Medical Decision Making    HPI:    Patient presents because of fall.  According to patient, she got up this morning feeling little dizzy.  She feels that way a lot in the morning.  Subsequently tried to get up off the commode when she subsequent fell forward and hit the left front of her head against the sink.  Did not lose consciousness.  Does take Eliquis .  Endorsing some left-sided paraspinal pain.  No loss of consciousness.  No numbness or tingling aware.  No diplopia or vision changes.  Otherwise, feeling at normal state of health.  No chest pain or shortness of breath.  No nausea vomit diarrhea.  Denies all other complaints.  Has been feeling fine here recently.  She states that she is usually vertiginous in the morning and  nothing was new about today's presentation.   Previous medical history reviewed : Patient was last seen in the ED on December 05, 2023.  Was seen because of fatigue.  Unremarkable workup at that time.  Likely viral upper story infection  MDM:   Upon exam, patient ANO x 3 with GCS 15.  No focal deficits.  Cranials 2 through 12 intact.  Normal finger-nose.  No concerns of CVA.  In terms of the vertigo, this sounds like this is a common thing for her in the morning.   Paraspinal tenderness on the left side.  Contusion on the left frontal forehead.  No concerns for any kind of facial bone fractures.  No diplopia with eye movement.  No concerns for any kind entrapment.  Will obtain CT head as well as a CT C-spine.  Obtain basic laboratory workup as well and EKG  Reevaluation:   Upon reexamination, patient hemodynamically stable.  Remains A&O x 3 with GCS 15.  CT head imaging as well as CT C-spine imaging benign.  No acute fracture was seen.  No pneumothorax or broken ribs on the chest x-ray.  Pelvis normal.  No fracture.  Laboratory workup showed no acute laboratory derangements.  Creatinine 1.48 which is around patient's baseline.  Calcium  8.6 but otherwise nothing significant. Patient was given Tums.   Cardiac enzymes were obtained out in triage.  These were negative.  Have no concerns for any ACS pathology.   Think this is a mechanical fall.      EKG Interpreted by Me: NSR    Cardiac Tele Interpreted by Me: NSR   I have independently interpreted the CXR  and CT  images and agree with the radiologist finding   Social Determinant of Health: None    Disposition and Follow Up: PCP      Final diagnoses:  Fall, initial encounter  Vertigo  Contusion of scalp, initial encounter    ED Discharge Orders     None          Simon Lavonia SAILOR, MD 01/30/24 [phone]

## 2024-01-30 NOTE — ED Triage Notes (Addendum)
 Pt POV c/o getting out of bed and attempting to go to the bathroom when she became dizzy causing her to fall forward striking her R anterior head on the sink and then ending up on the floor. Pt denies any LOC. +Thinners/Eliquis . Pt vitally stable and A/Ox4 on arrival.

## 2024-02-04 ENCOUNTER — Other Ambulatory Visit: Payer: Self-pay | Admitting: Family Medicine

## 2024-02-04 DIAGNOSIS — M159 Polyosteoarthritis, unspecified: Secondary | ICD-10-CM

## 2024-02-07 NOTE — Progress Notes (Signed)
   02/07/2024  Patient ID: [name], female   DOB: February 02, 1941, 83 y.o.   MRN: [id number]  Pharmacy Quality Measure Review  This patient is appearing on a report for being at risk of failing the adherence measure for cholesterol (statin) medications this calendar year.   Medication: Atorvastatin  40mg  Last fill date: 10/29/23 for 90 day supply  Contacted pharmacy to facilitate refills. Patient aware and will pick up this weekend.  Jon VEAR Lind[place], PharmD Clinical Pharmacist [phone]

## 2024-02-11 ENCOUNTER — Other Ambulatory Visit: Payer: Self-pay | Admitting: Family Medicine

## 2024-02-12 ENCOUNTER — Telehealth: Payer: Self-pay | Admitting: Family Medicine

## 2024-02-12 NOTE — Telephone Encounter (Signed)
 Copied from CRM #[id number]. Topic: Clinical - Prescription Issue >> Feb 12, 2024  1:04 PM Suzen RAMAN wrote: Reason for CRM: patient called to inquire why medication memantine  (NAMENDA ) 10 MG tablet was denied. Patient advise per refusal reason refill not appropriate(neurologist); assuming this is a medication typically filled by patient neurologist. When inquired patient states she reached out to her neurologist and was advised to contact her PCP.   Please contact patient to further advise. [phone] (M)

## 2024-02-12 NOTE — Telephone Encounter (Signed)
 This is a medication prescribed by her neurologist. She may be due for follow up. BJ

## 2024-02-12 NOTE — Telephone Encounter (Addendum)
 Patient was advised and states that she reached out to the neurologist office and they told her to call PCP to have refills. She has appointment with them on 02/25/24 but need some medicine to last until her appointment. She wants to know if Dr. Jordan can just send in a few pills to last until her appointment.

## 2024-02-14 ENCOUNTER — Other Ambulatory Visit: Payer: Self-pay | Admitting: Family Medicine

## 2024-02-14 MED ORDER — MEMANTINE HCL 10 MG PO TABS: 10.0000 mg | ORAL_TABLET | Freq: Two times a day (BID) | ORAL | 0 refills | Status: DC

## 2024-02-14 NOTE — Telephone Encounter (Signed)
 Rx for Namenda  10 mg to continue bid sent to her pharmacy. Keep appt with neurologist. Thanks, BJ

## 2024-02-17 NOTE — Telephone Encounter (Signed)
Medication refilled pt notified 

## 2024-02-18 ENCOUNTER — Telehealth: Payer: Self-pay | Admitting: Neurology

## 2024-02-18 ENCOUNTER — Encounter: Payer: Self-pay | Admitting: Neurology

## 2024-02-18 ENCOUNTER — Telehealth: Payer: Self-pay

## 2024-02-18 ENCOUNTER — Ambulatory Visit: Admitting: Neurology

## 2024-02-18 VITALS — BP 125/78 | HR 89 | Ht 63.0 in | Wt 203.8 lb

## 2024-02-18 DIAGNOSIS — R569 Unspecified convulsions: Secondary | ICD-10-CM

## 2024-02-18 DIAGNOSIS — G3184 Mild cognitive impairment, so stated: Secondary | ICD-10-CM | POA: Diagnosis not present

## 2024-02-18 DIAGNOSIS — F03B0 Unspecified dementia, moderate, without behavioral disturbance, psychotic disturbance, mood disturbance, and anxiety: Secondary | ICD-10-CM

## 2024-02-18 MED ORDER — MEMANTINE HCL 10 MG PO TABS: 10.0000 mg | ORAL_TABLET | Freq: Two times a day (BID) | ORAL | 3 refills | Status: DC

## 2024-02-18 MED ORDER — LAMOTRIGINE ER 200 MG PO TB24: 200.0000 mg | ORAL_TABLET | Freq: Every day | ORAL | 4 refills | Status: DC

## 2024-02-18 NOTE — Telephone Encounter (Signed)
 Please advise. Not on med list. Not in last 2 office notes.   Copied from CRM [id number]. Topic: Clinical - Medication Question >> Feb 18, 2024  3:26 PM Pinkey ORN wrote: Reason for CRM: Prescription >> Feb 18, 2024  3:27 PM Pinkey ORN wrote: Patient called in, states she's wanting to know if Jordan, Dickey MATSU, MD could write her out an prescription for herpes. Patient states she doesn't know the name of the medication.
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
## 2024-02-18 NOTE — Patient Instructions (Signed)
 Continue Namenda , Lamictal  Call for seizure activity Close monitoring of driving for safety  Screen for markers of Alzheimer's Good management of vascular risk factors, stay active Follow-up in 1 year.  Keep close follow-up with PCP.

## 2024-02-18 NOTE — Telephone Encounter (Signed)
 Pt states she was only given 2 weeks of medication and wanted to know the reason why. I called [facility] and was told they already have filled at 2 weeks supply for memantine  10 mg tablet. Once the 2 week supply is done she can pick up 90 day supply Rx.   Pt informed with this as well and verbalized she understood.

## 2024-02-18 NOTE — Telephone Encounter (Signed)
 Pt called to request to speak to NP . Pt would like to know why medication was a small amount . Pt would like to discuss with NP if possible  memantine  (NAMENDA ) 10 MG tablet

## 2024-02-20 NOTE — Telephone Encounter (Signed)
 Pt calling for status update on this request as she has not heard anything yet. She has an upcoming appt on 11/25 but is having an active outbreak and would medication so she doesn't have to wait until next week for treatment. Please call pt to advise

## 2024-02-21 ENCOUNTER — Other Ambulatory Visit: Payer: Self-pay | Admitting: Family Medicine

## 2024-02-21 DIAGNOSIS — A6000 Herpesviral infection of urogenital system, unspecified: Secondary | ICD-10-CM

## 2024-02-21 MED ORDER — VALACYCLOVIR HCL 500 MG PO TABS: ORAL_TABLET | ORAL | 1 refills | Status: AC

## 2024-02-21 NOTE — Telephone Encounter (Signed)
 Medication is not on her med list. History of genital herpes, so prescription for valacyclovir  500 mg sent to take within the first 48 hours of symptom flareup, twice daily for 3 days. Thanks, BJ

## 2024-02-24 ENCOUNTER — Other Ambulatory Visit: Payer: Self-pay | Admitting: *Deleted

## 2024-02-24 MED ORDER — LAMOTRIGINE ER 200 MG PO TB24: 200.0000 mg | ORAL_TABLET | Freq: Every day | ORAL | 4 refills | Status: DC

## 2024-02-24 NOTE — Telephone Encounter (Signed)
 Patient returned call. Advised patient of Dr. Gib response. Patient voiced understanding.

## 2024-02-24 NOTE — Telephone Encounter (Signed)
 Left voicemail for patient to return my call.

## 2024-02-24 NOTE — Telephone Encounter (Signed)
 Patent gave verbal consent for [facility] to send refill request for lamotrigine  ER 200 mg

## 2024-02-25 ENCOUNTER — Other Ambulatory Visit: Payer: Self-pay | Admitting: Family Medicine

## 2024-02-25 ENCOUNTER — Ambulatory Visit: Admitting: Family Medicine

## 2024-02-25 DIAGNOSIS — K5900 Constipation, unspecified: Secondary | ICD-10-CM

## 2024-02-25 DIAGNOSIS — M797 Fibromyalgia: Secondary | ICD-10-CM

## 2024-02-25 DIAGNOSIS — M159 Polyosteoarthritis, unspecified: Secondary | ICD-10-CM

## 2024-02-25 DIAGNOSIS — J45991 Cough variant asthma: Secondary | ICD-10-CM

## 2024-02-25 NOTE — Telephone Encounter (Unsigned)
 Copied from CRM #[id number]. Topic: Clinical - Medication Refill >> Feb 25, 2024  4:01 PM Frederich PARAS wrote: Medication: Milnacipran  HCl (SAVELLA  TITRATION PACK) 12.5 & 25 & 50 MG MISC, atorvastatin  (LIPITOR) 40 MG tablet,  budesonide -formoterol  (SYMBICORT ) 160-4.5 MCG/ACT inhaler,  montelukast  (SINGULAIR ) 10 MG tablet, linaclotide  (LINZESS ) 145 MCG CAPS capsule, DULoxetine  (CYMBALTA ) 30 MG capsule pantoprazole  (PROTONIX ) 40 MG tablet memantine  (NAMENDA ) 10 MG tablet nitroGLYCERIN  (NITROSTAT ) 0.4 MG SL tablet ELIQUIS  5 MG TABS table  Has the patient contacted their pharmacy? Yes, [facility] called to do the fills   This is the patient's preferred pharmacy:  [facility]  - [address]  [place] ,[place]  [address] - Phone: [phone] Fax: [phone]  Is this the correct pharmacy for this prescription? Yes If no, delete pharmacy and type the correct one.   Has the prescription been filled recently? Yes  Is the patient out of the medication? Yes  Has the patient been seen for an appointment in the last year OR does the patient have an upcoming appointment? Yes  Can we respond through MyChart? No  Agent: Please be advised that Rx refills may take up to 3 business days. We ask that you follow-up with your pharmacy.

## 2024-02-26 ENCOUNTER — Encounter: Payer: Self-pay | Admitting: Family Medicine

## 2024-02-26 ENCOUNTER — Other Ambulatory Visit: Payer: Self-pay | Admitting: Family Medicine

## 2024-02-26 ENCOUNTER — Ambulatory Visit: Admitting: Family Medicine

## 2024-02-26 VITALS — BP 120/74 | HR 77 | Temp 98.7°F | Resp 16 | Wt 200.0 lb

## 2024-02-26 DIAGNOSIS — W19XXXD Unspecified fall, subsequent encounter: Secondary | ICD-10-CM | POA: Diagnosis not present

## 2024-02-26 DIAGNOSIS — M797 Fibromyalgia: Secondary | ICD-10-CM | POA: Diagnosis not present

## 2024-02-26 DIAGNOSIS — M159 Polyosteoarthritis, unspecified: Secondary | ICD-10-CM

## 2024-02-26 DIAGNOSIS — R079 Chest pain, unspecified: Secondary | ICD-10-CM

## 2024-02-26 DIAGNOSIS — A6000 Herpesviral infection of urogenital system, unspecified: Secondary | ICD-10-CM

## 2024-02-26 DIAGNOSIS — G63 Polyneuropathy in diseases classified elsewhere: Secondary | ICD-10-CM

## 2024-02-26 DIAGNOSIS — E1149 Type 2 diabetes mellitus with other diabetic neurological complication: Secondary | ICD-10-CM

## 2024-02-26 DIAGNOSIS — Y92009 Unspecified place in unspecified non-institutional (private) residence as the place of occurrence of the external cause: Secondary | ICD-10-CM

## 2024-02-26 DIAGNOSIS — F03B0 Unspecified dementia, moderate, without behavioral disturbance, psychotic disturbance, mood disturbance, and anxiety: Secondary | ICD-10-CM | POA: Diagnosis not present

## 2024-02-26 MED ORDER — SAVELLA 50 MG PO TABS: 50.0000 mg | ORAL_TABLET | Freq: Two times a day (BID) | ORAL | 1 refills | Status: DC

## 2024-02-26 MED ORDER — SAVELLA TITRATION PACK 12.5 & 25 & 50 MG PO MISC: ORAL | 0 refills | Status: DC

## 2024-02-26 MED ORDER — ATORVASTATIN CALCIUM 40 MG PO TABS: 40.0000 mg | ORAL_TABLET | Freq: Every day | ORAL | 2 refills | Status: DC

## 2024-02-26 MED ORDER — NITROGLYCERIN 0.4 MG SL SUBL: SUBLINGUAL_TABLET | SUBLINGUAL | 0 refills | Status: DC

## 2024-02-26 MED ORDER — APIXABAN 5 MG PO TABS: 5.0000 mg | ORAL_TABLET | Freq: Two times a day (BID) | ORAL | 2 refills | Status: DC

## 2024-02-26 MED ORDER — BUDESONIDE-FORMOTEROL FUMARATE 160-4.5 MCG/ACT IN AERO: 2.0000 | INHALATION_SPRAY | Freq: Two times a day (BID) | RESPIRATORY_TRACT | 3 refills | Status: DC

## 2024-02-26 MED ORDER — LINACLOTIDE 145 MCG PO CAPS: 145.0000 ug | ORAL_CAPSULE | Freq: Every day | ORAL | 1 refills | Status: DC

## 2024-02-26 MED ORDER — PANTOPRAZOLE SODIUM 40 MG PO TBEC: DELAYED_RELEASE_TABLET | ORAL | 2 refills | Status: DC

## 2024-02-26 MED ORDER — MONTELUKAST SODIUM 10 MG PO TABS: 10.0000 mg | ORAL_TABLET | Freq: Every day | ORAL | 2 refills | Status: DC

## 2024-02-26 NOTE — Patient Instructions (Addendum)
 A few things to remember from today's visit:  Fibromyalgia  Type 2 diabetes mellitus with neurological complications (HCC) - Plan: Urine Albumin /Creatinine with ratio (send out) [LAB689]  Start weaning off Duloxetine  30 mg, take it every other day for 2 weeks then every 3rd day for 2 weeks and stop. Continue Savella  50 mg 2 times daily. Low impact exercise. If chest pain with exertion, call your cardiologist.  If you need refills for medications you take chronically, please call your pharmacy. Do not use My Chart to request refills or for acute issues that need immediate attention. If you send a my chart message, it may take a few days to be addressed, specially if I am not in the office.  Please be sure medication list is accurate. If a new problem present, please set up appointment sooner than planned today.
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
## 2024-02-26 NOTE — Assessment & Plan Note (Signed)
 Wt has been stable. She understands the benefits of wt loss as well as adverse effects of obesity. Consistency with healthy diet and physical activity as tolerated encouraged.

## 2024-02-26 NOTE — Assessment & Plan Note (Signed)
 She recently followed with neurologist and very concerned about cognitive declining. We discussed Dx and prognosis. She feels like Namenda  10 mg bid helps.  She drives to the groceries store and to visit her sister, has not been involved in MVA and has not gotten lost. Follows with neuro annually.

## 2024-02-26 NOTE — Assessment & Plan Note (Signed)
 Problem has been stable. Continue Valtrex  500 mg tid x 5d prn. Rx was sent recently, when she called for refills.

## 2024-02-26 NOTE — Assessment & Plan Note (Signed)
 Last HgA1C at goal, 6.3 in 11/2023. Continue non pharmacologic treatment.  Continue appropriate foot care and annual eye exams.

## 2024-02-26 NOTE — Assessment & Plan Note (Signed)
 Reporting 2 months of left-sided chest achy like sensation for a few minutes radiated to left shoulder and arm. Improved with shaking arm and it is not related with exertion. Differential Dx discussed. She states that recently she was evaluated by her cardiologist and EKG was fine. Clearly instructed about warning signs.

## 2024-02-26 NOTE — Assessment & Plan Note (Signed)
 She feels like Savella  helps, completed starting kit, no side effects. Duloxetine  30 mg to continue weaning off as instructed. Continue Savella  50 mg bid. Low impact exercise to continue and good sleep hygiene. F/U in 4 months, before if needed.

## 2024-03-03 ENCOUNTER — Ambulatory Visit: Payer: Self-pay | Admitting: Neurology

## 2024-03-03 NOTE — Telephone Encounter (Signed)
 I called the patient.  ATN profile was normal, meaning markers for Alzheimer's were negative at this time.  Further genetic testing to look for APOE was not able to be performed with the provided assay. Keep next follow up appointment.

## 2024-03-04 ENCOUNTER — Ambulatory Visit: Payer: Self-pay | Admitting: Family Medicine

## 2024-03-04 LAB — MICROALBUMIN / CREATININE URINE RATIO
Creatinine,U: 55.5 mg/dL
Microalb Creat Ratio: UNDETERMINED mg/g (ref 0.0–30.0)
Microalb, Ur: <0.7 mg/dL

## 2024-03-05 ENCOUNTER — Other Ambulatory Visit: Payer: Self-pay | Admitting: *Deleted

## 2024-03-05 DIAGNOSIS — J45991 Cough variant asthma: Secondary | ICD-10-CM

## 2024-03-05 DIAGNOSIS — M159 Polyosteoarthritis, unspecified: Secondary | ICD-10-CM

## 2024-03-05 DIAGNOSIS — M797 Fibromyalgia: Secondary | ICD-10-CM

## 2024-03-05 DIAGNOSIS — K5900 Constipation, unspecified: Secondary | ICD-10-CM

## 2024-03-05 LAB — ATN PROFILE
A -- Beta-amyloid 42/40 Ratio: 0.125 (ref >0.102)
Beta-amyloid 40: 191.33 pg/mL
Beta-amyloid 42: 23.96 pg/mL
N -- NfL, Plasma: 3.76 pg/mL (ref 0.00–9.13)
T -- p-tau181: 0.84 pg/mL (ref 0.00–0.97)

## 2024-03-05 LAB — APOE ALZHEIMER'S DISEASE RISK

## 2024-03-05 MED ORDER — BUDESONIDE-FORMOTEROL FUMARATE 160-4.5 MCG/ACT IN AERO: 2.0000 | INHALATION_SPRAY | Freq: Two times a day (BID) | RESPIRATORY_TRACT | 0 refills | Status: AC

## 2024-03-05 MED ORDER — APIXABAN 5 MG PO TABS: 5.0000 mg | ORAL_TABLET | Freq: Two times a day (BID) | ORAL | 1 refills | Status: AC

## 2024-03-05 MED ORDER — MONTELUKAST SODIUM 10 MG PO TABS: 10.0000 mg | ORAL_TABLET | Freq: Every day | ORAL | 1 refills | Status: AC

## 2024-03-05 MED ORDER — PANTOPRAZOLE SODIUM 40 MG PO TBEC: DELAYED_RELEASE_TABLET | ORAL | 1 refills | Status: DC

## 2024-03-05 MED ORDER — SAVELLA 50 MG PO TABS: 50.0000 mg | ORAL_TABLET | Freq: Two times a day (BID) | ORAL | 1 refills | Status: AC

## 2024-03-05 MED ORDER — MEMANTINE HCL 10 MG PO TABS: 10.0000 mg | ORAL_TABLET | Freq: Two times a day (BID) | ORAL | 1 refills | Status: AC

## 2024-03-05 MED ORDER — ATORVASTATIN CALCIUM 40 MG PO TABS: 40.0000 mg | ORAL_TABLET | Freq: Every day | ORAL | 1 refills | Status: DC

## 2024-03-05 MED ORDER — NITROGLYCERIN 0.4 MG SL SUBL: SUBLINGUAL_TABLET | SUBLINGUAL | 0 refills | Status: AC

## 2024-03-05 MED ORDER — DULOXETINE HCL 30 MG PO CPEP: 30.0000 mg | ORAL_CAPSULE | Freq: Every day | ORAL | 1 refills | Status: DC

## 2024-03-05 MED ORDER — LINACLOTIDE 145 MCG PO CAPS: 145.0000 ug | ORAL_CAPSULE | Freq: Every day | ORAL | 1 refills | Status: AC

## 2024-03-10 ENCOUNTER — Other Ambulatory Visit: Payer: Self-pay | Admitting: Family Medicine

## 2024-03-10 ENCOUNTER — Telehealth: Payer: Self-pay

## 2024-03-10 DIAGNOSIS — M159 Polyosteoarthritis, unspecified: Secondary | ICD-10-CM

## 2024-03-10 MED ORDER — DULOXETINE HCL 30 MG PO CPEP: ORAL_CAPSULE | ORAL | 0 refills | Status: AC

## 2024-03-10 NOTE — Telephone Encounter (Signed)
 Copied from CRM [id number]. Topic: General - Other >> Mar 10, 2024  4:05 PM [name] wrote: Reason for CRM: Patient returned the call from the clinic I provided patient that ,Per  last visit Dr. Jordan changed her medication to 30 mg. For the  Duloxtine. Patient understood.

## 2024-03-10 NOTE — Telephone Encounter (Signed)
 Copied from CRM #[id number]. Topic: Clinical - Medication Question >> Mar 10, 2024 12:25 PM Eva FALCON wrote: Reason for CRM: Paulette is calling in from [employer] and states she received two different scripts for Duloxtine and is trying to see which dosage patient is on or if she is taking both? Please reach out to clarify [phone].

## 2024-03-10 NOTE — Telephone Encounter (Signed)
 LVMTRC. FYI if patient calls back: Per patients last visit Dr. Jordan changed her medication to 30 mg. If patient wants to know if she can cut the 60 mg tablets in half she would need to call her pharmacy for that correct information.

## 2024-03-11 ENCOUNTER — Other Ambulatory Visit: Payer: Self-pay | Admitting: Neurology

## 2024-03-11 NOTE — Telephone Encounter (Signed)
 Per 02/24/24 refill request patient would like Rx to be sent to [facility].   Rx denied

## 2024-03-11 NOTE — Telephone Encounter (Signed)
 Patient informed voiced understanding and patient did not have any further questions.

## 2024-03-12 ENCOUNTER — Telehealth: Payer: Self-pay | Admitting: *Deleted

## 2024-03-12 ENCOUNTER — Telehealth: Payer: Self-pay | Admitting: Neurology

## 2024-03-12 MED ORDER — LAMOTRIGINE ER 200 MG PO TB24: 200.0000 mg | ORAL_TABLET | Freq: Every day | ORAL | 0 refills | Status: AC

## 2024-03-12 NOTE — Telephone Encounter (Signed)
 Meds ordered this encounter  Medications   LamoTRIgine  200 MG TB24 24 hour tablet    Sig: Take 1 tablet (200 mg total) by mouth at bedtime.    Dispense:  14 tablet    Refill:  0    Short term refill, mail order is running late

## 2024-03-12 NOTE — Telephone Encounter (Signed)
 While pt waits on the mail order from [facility] she is asking if just 2 weeks worth of the LamoTRIgine  200 MG TB24 24 hour tablet can be called into [facility] [phone] so she does not have any seizure activity

## 2024-03-12 NOTE — Telephone Encounter (Signed)
 Copied from CRM #[id number]. Topic: Clinical - Medication Question >> Mar 12, 2024  4:43 PM Rea ORN wrote: Reason for CRM: Viva with [facility] calling to get clarification meds. Pt was dispensed Savella  at another pharmacy. [facility] received a rx for DULoxetine  (CYMBALTA ) 30 MG and 60 MG. They would like to know if they are supposed to dispense Duloxetine  to pt and the instructions.    Please call back to advise [phone], [facility] Doctor line.

## 2024-03-12 NOTE — Telephone Encounter (Signed)
 Lmvm for pt that Sarah NP did call in rx for lamotrigine  200mg  #14 to [facility] while waits for mail order.

## 2024-03-13 NOTE — Telephone Encounter (Signed)
 [facility] was informed to discontinue the Duloxetine  60 mg and continue the Duloxetine  30 mg.

## 2024-03-14 ENCOUNTER — Ambulatory Visit (HOSPITAL_COMMUNITY): Admission: EM | Admit: 2024-03-14 | Discharge: 2024-03-14 | Disposition: A | Discharge status: Home / Self Care

## 2024-03-14 ENCOUNTER — Encounter (HOSPITAL_COMMUNITY): Payer: Self-pay

## 2024-03-14 DIAGNOSIS — M5412 Radiculopathy, cervical region: Secondary | ICD-10-CM | POA: Diagnosis not present

## 2024-03-14 DIAGNOSIS — G4486 Cervicogenic headache: Secondary | ICD-10-CM | POA: Diagnosis not present

## 2024-03-14 DIAGNOSIS — M5030 Other cervical disc degeneration, unspecified cervical region: Secondary | ICD-10-CM
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
## 2024-03-14 NOTE — ED Triage Notes (Signed)
 Patient states she has had a headache and left arm pain x 2 weeks. Patient states she saw her cardiologist last week for the same and patient states everything was OK. Patient states, He did an heart test and checked everything.

## 2024-03-14 NOTE — Discharge Instructions (Signed)
 At this time, I believe that the numbness that you are experiencing in your left hand is related to the degenerative disc disease in your cervical spine.  I think the headache that you are experiencing is related to the pain and tension in your cervical spine which is causing you to have a headache.  I am glad that the application of an ice pack was helpful.  I agree that it would be a good idea for you to go home and take to 500 mg tablets of Tylenol , apply another ice pack to your neck and get a good night sleep.  As we discussed, if that ice is no longer helping or your headache is getting worse, please go to the emergency room for further evaluation.  Thank you for visiting [facility] today.  We appreciate the opportunity to participate in your care.

## 2024-03-18 ENCOUNTER — Telehealth: Payer: Self-pay

## 2024-03-18 NOTE — Telephone Encounter (Signed)
 LVMTRC.  Copied from CRM #[id number]. Topic: Clinical - Prescription Issue >> Mar 18, 2024  3:37 PM Thersia BROCKS wrote: Reason for CRM: Patient called in regarding needing to move all her prescription back over to   [id number] - [place] - [address] AT [facility] [address]  [place] [address] Phone: [phone] Fax: [phone]

## 2024-03-18 NOTE — Telephone Encounter (Signed)
 Copied from CRM #[id number]. Topic: Clinical - Prescription Issue >> Mar 18, 2024  3:37 PM [name] wrote: Reason for CRM: Patient called in regarding needing to move all her prescription back over to   [facility] #[id number] - [place], [place] - [address] AT [facility] OF [address] [place] [address] Phone: [phone] Fax: [phone]

## 2024-03-18 NOTE — Telephone Encounter (Signed)
 LVMTRC

## 2024-03-18 NOTE — Telephone Encounter (Signed)
 Patient stated her medications came in a packet and were not right. Patient stated she does not want to be with [facility] anymore and wants to go back to her local pharmacy. I asked her to call me back and let me know which medications she don't have and we will send them to her local pharmacy.   Copied from CRM #[id number]. Topic: General - Call Back - No Documentation >> Mar 18, 2024  3:50 PM Franky GRADE wrote: Reason for CRM: Patient is returning a call she received from the office;however, no documentation on file.

## 2024-03-19 ENCOUNTER — Telehealth: Payer: Self-pay | Admitting: *Deleted

## 2024-03-19 NOTE — Telephone Encounter (Signed)
 Copied from CRM #[id number]. Topic: Clinical - Prescription Issue >> Mar 19, 2024  1:37 PM Deleta RAMAN wrote: Reason for CRM: patient is missing a few medications state they are not on the list she request to speak with veronica. The medications are irbesartan  (AVAPRO ) 75 MG tablet, atorvastatin  (LIPITOR) 40 MG tablet, and pantoprazole  (PROTONIX ) 40 MG tablet

## 2024-03-20 ENCOUNTER — Other Ambulatory Visit: Payer: Self-pay

## 2024-03-20 ENCOUNTER — Other Ambulatory Visit: Payer: Self-pay | Admitting: Family Medicine

## 2024-03-20 DIAGNOSIS — I10 Essential (primary) hypertension: Secondary | ICD-10-CM

## 2024-03-20 MED ORDER — IRBESARTAN 75 MG PO TABS: 75.0000 mg | ORAL_TABLET | Freq: Every day | ORAL | 1 refills | Status: DC

## 2024-03-20 MED ORDER — ATORVASTATIN CALCIUM 40 MG PO TABS: 40.0000 mg | ORAL_TABLET | Freq: Every day | ORAL | 1 refills | Status: AC

## 2024-03-20 MED ORDER — PANTOPRAZOLE SODIUM 40 MG PO TBEC: DELAYED_RELEASE_TABLET | ORAL | 1 refills | Status: AC

## 2024-03-20 MED ORDER — IRBESARTAN 75 MG PO TABS: 75.0000 mg | ORAL_TABLET | Freq: Every day | ORAL | 1 refills | Status: AC

## 2024-03-20 NOTE — Telephone Encounter (Signed)
 All three medications have been filled to local pharmacy - irbesartan  (AVAPRO ) 75 MG tablet, atorvastatin  (LIPITOR) 40 MG tablet, and pantoprazole  (PROTONIX ) 40 MG tablet

## 2024-03-20 NOTE — Telephone Encounter (Signed)
 Copied from CRM #[id number]. Topic: Clinical - Medication Refill >> Mar 20, 2024 10:26 AM Delon DASEN wrote: Medication: irbesartan  (AVAPRO ) 75 MG tablet  Has the patient contacted their pharmacy? No (Agent: If no, request that the patient contact the pharmacy for the refill. If patient does not wish to contact the pharmacy document the reason why and proceed with request.) (Agent: If yes, when and what did the pharmacy advise?)  This is the patient's preferred pharmacy:  [facility] [phone] - [place], [place] - [address] AT [facility] [address] [place] [address] Phone: [phone] Fax: [phone]  Is this the correct pharmacy for this prescription? Yes If no, delete pharmacy and type the correct one.   Has the prescription been filled recently? Yes  Is the patient out of the medication? No  Has the patient been seen for an appointment in the last year OR does the patient have an upcoming appointment? Yes  Can we respond through MyChart? No  Agent: Please be advised that Rx refills may take up to 3 business days. We ask that you follow-up with your pharmacy.

## 2024-03-20 NOTE — Telephone Encounter (Signed)
Medications have been sent to local pharmacy.

## 2024-06-29 ENCOUNTER — Ambulatory Visit

## 2025-02-17 ENCOUNTER — Ambulatory Visit: Admitting: Neurology
